# Patient Record
Sex: Female | Born: 1990 | Race: White | Hispanic: No | Smoking: Former smoker
Health system: Southern US, Community
[De-identification: ages and names within clinical notes are randomized; demographics above are authoritative.]

## PROBLEM LIST (undated history)

## (undated) DIAGNOSIS — F319 Bipolar disorder, unspecified: Secondary | ICD-10-CM

## (undated) DIAGNOSIS — E785 Hyperlipidemia, unspecified: Secondary | ICD-10-CM

## (undated) DIAGNOSIS — G4730 Sleep apnea, unspecified: Secondary | ICD-10-CM

## (undated) DIAGNOSIS — I252 Old myocardial infarction: Secondary | ICD-10-CM

## (undated) DIAGNOSIS — K219 Gastro-esophageal reflux disease without esophagitis: Secondary | ICD-10-CM

## (undated) DIAGNOSIS — E039 Hypothyroidism, unspecified: Secondary | ICD-10-CM

## (undated) DIAGNOSIS — K5900 Constipation, unspecified: Secondary | ICD-10-CM

## (undated) DIAGNOSIS — M2550 Pain in unspecified joint: Secondary | ICD-10-CM

## (undated) DIAGNOSIS — Z91018 Allergy to other foods: Secondary | ICD-10-CM

## (undated) DIAGNOSIS — Z8711 Personal history of peptic ulcer disease: Secondary | ICD-10-CM

## (undated) DIAGNOSIS — F410 Panic disorder [episodic paroxysmal anxiety] without agoraphobia: Secondary | ICD-10-CM

## (undated) DIAGNOSIS — R1310 Dysphagia, unspecified: Secondary | ICD-10-CM

## (undated) DIAGNOSIS — F909 Attention-deficit hyperactivity disorder, unspecified type: Secondary | ICD-10-CM

## (undated) DIAGNOSIS — I319 Disease of pericardium, unspecified: Secondary | ICD-10-CM

## (undated) DIAGNOSIS — M549 Dorsalgia, unspecified: Secondary | ICD-10-CM

## (undated) DIAGNOSIS — R112 Nausea with vomiting, unspecified: Secondary | ICD-10-CM

## (undated) DIAGNOSIS — K829 Disease of gallbladder, unspecified: Secondary | ICD-10-CM

## (undated) DIAGNOSIS — I10 Essential (primary) hypertension: Secondary | ICD-10-CM

## (undated) DIAGNOSIS — M069 Rheumatoid arthritis, unspecified: Secondary | ICD-10-CM

## (undated) DIAGNOSIS — M0820 Juvenile rheumatoid arthritis with systemic onset, unspecified site: Secondary | ICD-10-CM

## (undated) DIAGNOSIS — F32A Depression, unspecified: Secondary | ICD-10-CM

## (undated) DIAGNOSIS — I219 Acute myocardial infarction, unspecified: Secondary | ICD-10-CM

## (undated) DIAGNOSIS — M1990 Unspecified osteoarthritis, unspecified site: Secondary | ICD-10-CM

## (undated) DIAGNOSIS — R0602 Shortness of breath: Secondary | ICD-10-CM

## (undated) DIAGNOSIS — R079 Chest pain, unspecified: Secondary | ICD-10-CM

## (undated) DIAGNOSIS — F1990 Other psychoactive substance use, unspecified, uncomplicated: Secondary | ICD-10-CM

## (undated) DIAGNOSIS — R0600 Dyspnea, unspecified: Secondary | ICD-10-CM

## (undated) DIAGNOSIS — D649 Anemia, unspecified: Secondary | ICD-10-CM

## (undated) DIAGNOSIS — E559 Vitamin D deficiency, unspecified: Secondary | ICD-10-CM

## (undated) DIAGNOSIS — R519 Headache, unspecified: Secondary | ICD-10-CM

## (undated) HISTORY — DX: Shortness of breath: R06.02

## (undated) HISTORY — DX: Allergy to other foods: Z91.018

## (undated) HISTORY — DX: Pain in unspecified joint: M25.50

## (undated) HISTORY — DX: Personal history of peptic ulcer disease: Z87.11

## (undated) HISTORY — DX: Hypothyroidism, unspecified: E03.9

## (undated) HISTORY — DX: Unspecified osteoarthritis, unspecified site: M19.90

## (undated) HISTORY — DX: Chest pain, unspecified: R07.9

## (undated) HISTORY — DX: Bipolar disorder, unspecified: F31.9

## (undated) HISTORY — DX: Attention-deficit hyperactivity disorder, unspecified type: F90.9

## (undated) HISTORY — DX: Essential (primary) hypertension: I10

## (undated) HISTORY — DX: Old myocardial infarction: I25.2

## (undated) HISTORY — DX: Other psychoactive substance use, unspecified, uncomplicated: F19.90

## (undated) HISTORY — DX: Vitamin D deficiency, unspecified: E55.9

## (undated) HISTORY — DX: Hyperlipidemia, unspecified: E78.5

## (undated) HISTORY — DX: Disease of gallbladder, unspecified: K82.9

## (undated) HISTORY — DX: Dysphagia, unspecified: R13.10

## (undated) HISTORY — DX: Dorsalgia, unspecified: M54.9

## (undated) HISTORY — PX: WISDOM TOOTH EXTRACTION: SHX21

## (undated) HISTORY — DX: Constipation, unspecified: K59.00

---

## 2011-05-25 DIAGNOSIS — M0800 Unspecified juvenile rheumatoid arthritis of unspecified site: Secondary | ICD-10-CM | POA: Insufficient documentation

## 2011-05-25 DIAGNOSIS — F3132 Bipolar disorder, current episode depressed, moderate: Secondary | ICD-10-CM | POA: Insufficient documentation

## 2011-08-01 DIAGNOSIS — I219 Acute myocardial infarction, unspecified: Secondary | ICD-10-CM

## 2011-08-01 HISTORY — DX: Acute myocardial infarction, unspecified: I21.9

## 2012-11-16 ENCOUNTER — Emergency Department (HOSPITAL_COMMUNITY)
Admission: EM | Admit: 2012-11-16 | Discharge: 2012-11-16 | Disposition: A | Discharge status: Home / Self Care | Payer: Self-pay | Attending: Emergency Medicine | Admitting: Emergency Medicine

## 2012-11-16 ENCOUNTER — Encounter (HOSPITAL_COMMUNITY): Payer: Self-pay

## 2012-11-16 DIAGNOSIS — F17200 Nicotine dependence, unspecified, uncomplicated: Secondary | ICD-10-CM | POA: Insufficient documentation

## 2012-11-16 DIAGNOSIS — K089 Disorder of teeth and supporting structures, unspecified: Secondary | ICD-10-CM | POA: Insufficient documentation

## 2012-11-16 DIAGNOSIS — K0889 Other specified disorders of teeth and supporting structures: Secondary | ICD-10-CM

## 2012-11-16 DIAGNOSIS — R5381 Other malaise: Secondary | ICD-10-CM | POA: Insufficient documentation

## 2012-11-16 DIAGNOSIS — M069 Rheumatoid arthritis, unspecified: Secondary | ICD-10-CM | POA: Insufficient documentation

## 2012-11-16 HISTORY — DX: Rheumatoid arthritis, unspecified: M06.9

## 2012-11-16 MED ORDER — OXYCODONE-ACETAMINOPHEN 5-325 MG PO TABS
1.0000 | ORAL_TABLET | Freq: Once | ORAL | Status: DC
  Filled 2012-11-16: qty 1

## 2012-11-16 MED ORDER — OXYCODONE-ACETAMINOPHEN 5-325 MG PO TABS: 1.0000 | ORAL_TABLET | Freq: Four times a day (QID) | ORAL | Status: DC | PRN

## 2012-11-16 NOTE — ED Provider Notes (Signed)
History     CSN: [id number]  Arrival date & time 11/16/12  1336   First MD Initiated Contact with Patient 11/16/12 1354      Chief Complaint  Patient presents with  . Dental Pain    (Consider location/radiation/quality/duration/timing/severity/associated sxs/prior treatment) Patient is a 22 y.o. female presenting with tooth pain. The history is provided by the patient (the pt complains of a toothache.  she is on amoxicillin). No language interpreter was used.  Dental PainThe primary symptoms include mouth pain. Primary symptoms do not include headaches or cough. The symptoms began more than 1 month ago. The symptoms are worsening. The symptoms are recurrent. The symptoms occur constantly.  Additional symptoms include: dental sensitivity to temperature. Additional symptoms do not include: gum swelling and fatigue.    Past Medical History  Diagnosis Date  . RA (rheumatoid arthritis)     History reviewed. No pertinent past surgical history.  No family history on file.  History  Substance Use Topics  . Smoking status: Current Every Day Smoker    Types: Cigarettes  . Smokeless tobacco: Not on file  . Alcohol Use: No    OB History   Grav Para Term Preterm Abortions TAB SAB Ect Mult Living                  Review of Systems  Constitutional: Negative for appetite change and fatigue.  HENT: Negative for congestion, sinus pressure and ear discharge.        Toothache  Eyes: Negative for discharge.  Respiratory: Negative for cough.   Cardiovascular: Negative for chest pain.  Gastrointestinal: Negative for abdominal pain and diarrhea.  Genitourinary: Negative for frequency and hematuria.  Musculoskeletal: Negative for back pain.  Skin: Negative for rash.  Neurological: Negative for seizures and headaches.  Psychiatric/Behavioral: Negative for hallucinations.    Allergies  Tramadol and Strawberry  Home Medications   Current Outpatient Rx  Name  Route  Sig  Dispense   Refill  . amoxicillin (AMOXIL) 500 MG capsule   Oral   Take 500 mg by mouth 3 (three) times daily. For tooth infection.         Marland Kitchen HYDROcodone-acetaminophen (NORCO/VICODIN) 5-325 MG per tablet   Oral   Take 1 tablet by mouth every 6 (six) hours as needed for pain.         . naproxen (NAPROSYN) 500 MG tablet   Oral   Take 500 mg by mouth 2 (two) times daily.         Marland Kitchen oxyCODONE-acetaminophen (PERCOCET/ROXICET) 5-325 MG per tablet   Oral   Take 1 tablet by mouth every 6 (six) hours as needed for pain.   20 tablet   0     BP 133/89  Pulse 71  Temp(Src) 97.7 F (36.5 C) (Oral)  Resp 18  SpO2 100%  LMP 10/10/2012  Physical Exam  Constitutional: She is oriented to person, place, and time. She appears well-developed.  HENT:  Head: Normocephalic.  Tender right lower molar  Eyes: Conjunctivae are normal.  Neck: No tracheal deviation present.  Cardiovascular:  No murmur heard. Musculoskeletal: Normal range of motion.  Neurological: She is oriented to person, place, and time.  Skin: Skin is warm.  Psychiatric: She has a normal mood and affect.    ED Course  Procedures (including critical care time)  Labs Reviewed - No data to display No results found.   1. Toothache       MDM  Benny Lennert, MD 11/16/12 [phone]

## 2012-11-16 NOTE — Discharge Instructions (Signed)
Follow up with a dentist

## 2012-11-16 NOTE — ED Notes (Signed)
Pt stated she has also been to [facility] for the same, has no dentist.

## 2012-11-16 NOTE — ED Notes (Signed)
Pt reports dental pain for 3 weeks, has been to [facility] x2 for same --was seen there today, and given amoxil and lortab. She thinks that the meds are not working.  She has been having trouble w/ her tooth since January.

## 2014-01-22 ENCOUNTER — Emergency Department (HOSPITAL_COMMUNITY): Payer: Medicaid Other

## 2014-01-22 ENCOUNTER — Encounter (HOSPITAL_COMMUNITY): Payer: Self-pay | Admitting: Emergency Medicine

## 2014-01-22 ENCOUNTER — Emergency Department (HOSPITAL_COMMUNITY)
Admission: EM | Admit: 2014-01-22 | Discharge: 2014-01-23 | Disposition: A | Discharge status: Home / Self Care | Payer: Medicaid Other | Attending: Emergency Medicine | Admitting: Emergency Medicine

## 2014-01-22 DIAGNOSIS — Z8679 Personal history of other diseases of the circulatory system: Secondary | ICD-10-CM | POA: Insufficient documentation

## 2014-01-22 DIAGNOSIS — Z87891 Personal history of nicotine dependence: Secondary | ICD-10-CM | POA: Insufficient documentation

## 2014-01-22 DIAGNOSIS — Z791 Long term (current) use of non-steroidal anti-inflammatories (NSAID): Secondary | ICD-10-CM | POA: Insufficient documentation

## 2014-01-22 DIAGNOSIS — R6883 Chills (without fever): Secondary | ICD-10-CM | POA: Insufficient documentation

## 2014-01-22 DIAGNOSIS — M069 Rheumatoid arthritis, unspecified: Secondary | ICD-10-CM | POA: Insufficient documentation

## 2014-01-22 DIAGNOSIS — J9801 Acute bronchospasm: Secondary | ICD-10-CM | POA: Insufficient documentation

## 2014-01-22 DIAGNOSIS — R609 Edema, unspecified: Secondary | ICD-10-CM | POA: Insufficient documentation

## 2014-01-22 HISTORY — DX: Disease of pericardium, unspecified: I31.9

## 2014-01-22 HISTORY — DX: Juvenile rheumatoid arthritis with systemic onset, unspecified site: M08.20

## 2014-01-22 LAB — COMPREHENSIVE METABOLIC PANEL
ALBUMIN: 3.6 g/dL (ref 3.5–5.2)
ALK PHOS: 77 U/L (ref 39–117)
ALT: 41 U/L — ABNORMAL HIGH (ref 0–35)
AST: 24 U/L (ref 0–37)
BILIRUBIN TOTAL: 0.2 mg/dL — AB (ref 0.3–1.2)
BUN: 17 mg/dL (ref 6–23)
CHLORIDE: 102 meq/L (ref 96–112)
CO2: 25 mEq/L (ref 19–32)
CREATININE: 0.96 mg/dL (ref 0.50–1.10)
Calcium: 9.2 mg/dL (ref 8.4–10.5)
GFR calc Af Amer: >90 mL/min (ref >90)
GFR calc non Af Amer: 83 mL/min — ABNORMAL LOW (ref >90)
Glucose, Bld: 92 mg/dL (ref 70–99)
POTASSIUM: 3.5 meq/L — AB (ref 3.7–5.3)
Sodium: 140 mEq/L (ref 137–147)
TOTAL PROTEIN: 7.5 g/dL (ref 6.0–8.3)

## 2014-01-22 LAB — CBC WITH DIFFERENTIAL/PLATELET
BASOS ABS: 0 10*3/uL (ref 0.0–0.1)
BASOS PCT: 0 % (ref 0–1)
EOS ABS: 0.3 10*3/uL (ref 0.0–0.7)
Eosinophils Relative: 2 % (ref 0–5)
HCT: 38.1 % (ref 36.0–46.0)
Hemoglobin: 12.9 g/dL (ref 12.0–15.0)
Lymphocytes Relative: 23 % (ref 12–46)
Lymphs Abs: 3 10*3/uL (ref 0.7–4.0)
MCH: 29.9 pg (ref 26.0–34.0)
MCHC: 33.9 g/dL (ref 30.0–36.0)
MCV: 88.4 fL (ref 78.0–100.0)
MONOS PCT: 5 % (ref 3–12)
Monocytes Absolute: 0.7 10*3/uL (ref 0.1–1.0)
NEUTROS ABS: 8.8 10*3/uL — AB (ref 1.7–7.7)
NEUTROS PCT: 70 % (ref 43–77)
Platelets: 293 10*3/uL (ref 150–400)
RBC: 4.31 MIL/uL (ref 3.87–5.11)
RDW: 14.2 % (ref 11.5–15.5)
WBC: 12.8 10*3/uL — ABNORMAL HIGH (ref 4.0–10.5)

## 2014-01-22 MED ORDER — IPRATROPIUM-ALBUTEROL 0.5-2.5 (3) MG/3ML IN SOLN
3.0000 mL | Freq: Once | RESPIRATORY_TRACT | Status: AC
  Administered 2014-01-22: 3 mL via RESPIRATORY_TRACT
  Filled 2014-01-22: qty 3

## 2014-01-22 NOTE — ED Provider Notes (Signed)
CSN: [id number]     Arrival date & time 01/22/14  2115 History   First MD Initiated Contact with Patient 01/22/14 2310   This chart was scribed for Dione Booze, MD by Valera Castle, ED Scribe. This patient was seen in room APA07/AP[facility]07 and the patient's care was started at 11:13 PM.   Chief Complaint  Patient presents with  . Chest Pain   The history is provided by the patient. No language interpreter was used.   HPI Comments: [name] is a 23 y.o. female who presents to the Emergency Department complaining of constant, 8/10, tight left upper CP, onset 5 hours ago, that does not radiate. Patient reports that laying flat makes the pain worse, and sitting up straight relieves the pain. Patient reports assoicated symptoms of SOB, sweating nausea, and chills. Pt denies vomiting, cough, fever and any other associated symptoms. 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 May 22nd pericarditis hospitalized in [place]. Patient quit smoking 3 weeks ago. Was smoking 1 pack a day.    Past Medical History  Diagnosis Date  . RA (rheumatoid arthritis)   . Pericarditis   . Still's disease    History reviewed. No pertinent past surgical history. History reviewed. No pertinent family history. History  Substance Use Topics  . Smoking status: Former Smoker    Types: Cigarettes    Quit date: 01/01/2014  . Smokeless tobacco: Not on file  . Alcohol Use: No   OB History   Grav Para Term Preterm Abortions TAB SAB Ect Mult Living                 Review of Systems  Constitutional: Positive for chills.  All other systems reviewed and are negative.  Allergies  Toradol; Tramadol; and Strawberry  Home Medications   Prior to Admission medications   Medication Sig Start Date End Date Taking? Authorizing Provider  naproxen (NAPROSYN) 500 MG tablet Take 500 mg by mouth 2 (two) times daily.   Yes Historical Provider, MD   BP 126/78  Pulse 92   Temp(Src) 98.4 F (36.9 C) (Oral)  Resp 20  Ht 5\' 3"  (1.6 m)  Wt 255 lb (115.667 kg)  BMI 45.18 kg/m2  SpO2 99%  LMP 01/19/2014 Physical Exam  Nursing note and vitals reviewed. Constitutional: She is oriented to person, place, and time. She appears well-developed and well-nourished. No distress.  HENT:  Head: Normocephalic and atraumatic.  Eyes: Conjunctivae and EOM are normal. Pupils are equal, round, and reactive to light.  Neck: Normal range of motion. Neck supple. No JVD present. No tracheal deviation present.  Cardiovascular: Normal rate and regular rhythm.  Exam reveals no friction rub.   No murmur heard. Pulmonary/Chest: Effort normal and breath sounds normal. No respiratory distress. She has no wheezes. She has no rales.  Abdominal: Soft. Bowel sounds are normal. She exhibits no distension and no mass. There is no tenderness.  Musculoskeletal: Normal range of motion. She exhibits edema.  Trace edema  Lymphadenopathy:    She has no cervical adenopathy.  Neurological: She is alert and oriented to person, place, and time. She has normal reflexes. No cranial nerve deficit. Coordination normal.  Skin: Skin is warm and dry. No rash noted.  Psychiatric: She has a normal mood and affect. Her behavior is normal. Thought content normal.    ED Course  Procedures (including critical care time) DIAGNOSTIC STUDIES: Oxygen Saturation is 99% on RA, normal by my interpretation.  COORDINATION OF CARE: 11:20 PM-Discussed treatment plan which includes breathing treatment and blood work with pt at bedside and pt agreed to plan.   Medications  ipratropium-albuterol (DUONEB) 0.5-2.5 (3) MG/3ML nebulizer solution 3 mL (3 mLs Nebulization Given 01/22/14 2355)  ondansetron (ZOFRAN-ODT) disintegrating tablet 4 mg (4 mg Oral Given 01/23/14 0043)   Results for orders placed during the hospital encounter of 01/22/14  CBC WITH DIFFERENTIAL      Result Value Ref Range   WBC 12.8 (*) 4.0 - 10.5  K/uL   RBC 4.31  3.87 - 5.11 MIL/uL   Hemoglobin 12.9  12.0 - 15.0 g/dL   HCT 79.0  24.0 - 97.3 %   MCV 88.4  78.0 - 100.0 fL   MCH 29.9  26.0 - 34.0 pg   MCHC 33.9  30.0 - 36.0 g/dL   RDW 53.2  99.2 - 42.6 %   Platelets 293  150 - 400 K/uL   Neutrophils Relative % 70  43 - 77 %   Neutro Abs 8.8 (*) 1.7 - 7.7 K/uL   Lymphocytes Relative 23  12 - 46 %   Lymphs Abs 3.0  0.7 - 4.0 K/uL   Monocytes Relative 5  3 - 12 %   Monocytes Absolute 0.7  0.1 - 1.0 K/uL   Eosinophils Relative 2  0 - 5 %   Eosinophils Absolute 0.3  0.0 - 0.7 K/uL   Basophils Relative 0  0 - 1 %   Basophils Absolute 0.0  0.0 - 0.1 K/uL  COMPREHENSIVE METABOLIC PANEL      Result Value Ref Range   Sodium 140  137 - 147 mEq/L   Potassium 3.5 (*) 3.7 - 5.3 mEq/L   Chloride 102  96 - 112 mEq/L   CO2 25  19 - 32 mEq/L   Glucose, Bld 92  70 - 99 mg/dL   BUN 17  6 - 23 mg/dL   Creatinine, Ser 8.34  0.50 - 1.10 mg/dL   Calcium 9.2  8.4 - 19.6 mg/dL   Total Protein 7.5  6.0 - 8.3 g/dL   Albumin 3.6  3.5 - 5.2 g/dL   AST 24  0 - 37 U/L   ALT 41 (*) 0 - 35 U/L   Alkaline Phosphatase 77  39 - 117 U/L   Total Bilirubin 0.2 (*) 0.3 - 1.2 mg/dL   GFR calc non Af Amer 83 (*) >90 mL/min   GFR calc Af Amer >90  >90 mL/min  SEDIMENTATION RATE      Result Value Ref Range   Sed Rate 30 (*) 0 - 22 mm/hr   Dg Chest 2 View  01/22/2014   CLINICAL DATA:  Left-sided chest pain for 6 hr.  EXAM: CHEST  2 VIEW  COMPARISON:  08/08/2013  FINDINGS: The heart size and mediastinal contours are within normal limits. Both lungs are clear. The visualized skeletal structures are unremarkable.  IMPRESSION: No active cardiopulmonary disease.   Electronically Signed   By: Burman Nieves M.D.   On: 01/22/2014 23:32   Images viewed by me.    EKG Interpretation   Date/Time:  Thursday January 22 2014 21:39:09 EDT Ventricular Rate:  68 PR Interval:  178 QRS Duration: 74 QT Interval:  348 QTC Calculation: 370 R Axis:   53 Text Interpretation:   Sinus rhythm with marked sinus arrhythmia Otherwise  normal ECG No old tracing to compare Confirmed by [facility]  MD, DAVID ([id number])  on 01/22/2014 11:10:31 PM  MDM   Final diagnoses:  Bronchospasm    Chest tightness with slightly prolonged exhalation phase suggesting that they've etiology is bronchospasm. No findings on physical exam, x-ray, or ECG to suggest recurrent pericarditis. She will be given a therapeutic trial of albuterol with ipratropium. Screening labs including sedimentation rate will be obtained.   She feels significantly better following albuterol with ipratropium. Sedimentation rate is minimally elevated at 30 and I do not feel this is consistent with pericarditis. She will be discharged with a prescription for prednisone and albuterol inhaler. Initial dose of prednisone was given in the ED. She is encouraged to get established with a local rheumatologist.  I personally performed the services described in this documentation, which was scribed in my presence. The recorded information has been reviewed and is accurate.     Dione Booze, MD 01/23/14 [phone]

## 2014-01-22 NOTE — ED Notes (Signed)
Pt c/o left chest tightness 2-3 hours, hx of pericarditis, also feels like her throat is closing up esp during hot spells

## 2014-01-23 LAB — SEDIMENTATION RATE: SED RATE: 30 mm/h — AB (ref 0–22)

## 2014-01-23 MED ORDER — ALBUTEROL SULFATE HFA 108 (90 BASE) MCG/ACT IN AERS
2.0000 | INHALATION_SPRAY | RESPIRATORY_TRACT | Status: DC | PRN
  Administered 2014-01-23: 2 via RESPIRATORY_TRACT
  Filled 2014-01-23: qty 6.7

## 2014-01-23 MED ORDER — PREDNISONE 50 MG PO TABS
60.0000 mg | ORAL_TABLET | Freq: Once | ORAL | Status: AC
  Administered 2014-01-23: 60 mg via ORAL
  Filled 2014-01-23 (×2): qty 1

## 2014-01-23 MED ORDER — ACETAMINOPHEN 325 MG PO TABS
650.0000 mg | ORAL_TABLET | Freq: Once | ORAL | Status: AC
  Administered 2014-01-23: 650 mg via ORAL
  Filled 2014-01-23: qty 2

## 2014-01-23 MED ORDER — ONDANSETRON 4 MG PO TBDP
4.0000 mg | ORAL_TABLET | Freq: Once | ORAL | Status: AC
  Administered 2014-01-23: 4 mg via ORAL
  Filled 2014-01-23: qty 1

## 2014-01-23 NOTE — ED Notes (Signed)
Pt. Reports feeling nauseous and hot. EDP notified.

## 2014-01-23 NOTE — ED Notes (Addendum)
Pt. Vomited once prior to Zofran administration.

## 2014-01-23 NOTE — Discharge Instructions (Signed)
Take 60 mg prednisone for 5 days, then 40 mg for 5 days, then 20 mg for five days.  Bronchospasm A bronchospasm is a spasm or tightening of the airways going into the lungs. During a bronchospasm breathing becomes more difficult because the airways get smaller. When this happens there can be coughing, a whistling sound when breathing (wheezing), and difficulty breathing. Bronchospasm is often associated with asthma, but not all patients who experience a bronchospasm have asthma. CAUSES  A bronchospasm is caused by inflammation or irritation of the airways. The inflammation or irritation may be triggered by:   Allergies (such as to animals, pollen, food, or mold). Allergens that cause bronchospasm may cause wheezing immediately after exposure or many hours later.   Infection. Viral infections are believed to be the most common cause of bronchospasm.   Exercise.   Irritants (such as pollution, cigarette smoke, strong odors, aerosol sprays, and paint fumes).   Weather changes. Winds increase molds and pollens in the air. Rain refreshes the air by washing irritants out. Cold air may cause inflammation.   Stress and emotional upset.  SIGNS AND SYMPTOMS   Wheezing.   Excessive nighttime coughing.   Frequent or severe coughing with a simple cold.   Chest tightness.   Shortness of breath.  DIAGNOSIS  Bronchospasm is usually diagnosed through a history and physical exam. Tests, such as chest X-rays, are sometimes done to look for other conditions. TREATMENT   Inhaled medicines can be given to open up your airways and help you breathe. The medicines can be given using either an inhaler or a nebulizer machine.  Corticosteroid medicines may be given for severe bronchospasm, usually when it is associated with asthma. HOME CARE INSTRUCTIONS   Always have a plan prepared for seeking medical care. Know when to call your health care provider and local emergency services (911 in the  [place] Know where you can access local emergency care.  Only take medicines as directed by your health care provider.  If you were prescribed an inhaler or nebulizer machine, ask your health care provider to explain how to use it correctly. Always use a spacer with your inhaler if you were given one.  It is necessary to remain calm during an attack. Try to relax and breathe more slowly.  Control your home environment in the following ways:   Change your heating and air conditioning filter at least once a month.   Limit your use of fireplaces and wood stoves.  Do not smoke and do not allow smoking in your home.   Avoid exposure to perfumes and fragrances.   Get rid of pests (such as roaches and mice) and their droppings.   Throw away plants if you see mold on them.   Keep your house clean and dust free.   Replace carpet with wood, tile, or vinyl flooring. Carpet can trap dander and dust.   Use allergy-proof pillows, mattress covers, and box spring covers.   Wash bed sheets and blankets every week in hot water and dry them in a dryer.   Use blankets that are made of polyester or cotton.   Wash hands frequently. SEEK MEDICAL CARE IF:   You have muscle aches.   You have chest pain.   The sputum changes from clear or white to yellow, green, gray, or bloody.   The sputum you cough up gets thicker.   There are problems that may be related to the medicine you are given, such as a rash,  itching, swelling, or trouble breathing.  SEEK IMMEDIATE MEDICAL CARE IF:   You have worsening wheezing and coughing even after taking your prescribed medicines.   You have increased difficulty breathing.   You develop severe chest pain. MAKE SURE YOU:   Understand these instructions.  Will watch your condition.  Will get help right away if you are not doing well or get worse. Document Released: 07/20/2003 Document Revised: 07/22/2013 Document Reviewed:  01/06/2013 [facility] Patient Information 2015 [place], [place]. This information is not intended to replace advice given to you by your health care provider. Make sure you discuss any questions you have with your health care provider.  Albuterol inhalation aerosol What is this medicine? ALBUTEROL (al Gaspar Bidding) is a bronchodilator. It helps open up the airways in your lungs to make it easier to breathe. This medicine is used to treat and to prevent bronchospasm. This medicine may be used for other purposes; ask your health care provider or pharmacist if you have questions. COMMON BRAND NAME(S): Proair HFA, Proventil, Proventil HFA, Respirol, Ventolin, Ventolin HFA What should I tell my health care provider before I take this medicine? They need to know if you have any of the following conditions: -diabetes -heart disease or irregular heartbeat -high blood pressure -pheochromocytoma -seizures -thyroid disease -an unusual or allergic reaction to albuterol, levalbuterol, sulfites, other medicines, foods, dyes, or preservatives -pregnant or trying to get pregnant -breast-feeding How should I use this medicine? This medicine is for inhalation through the mouth. Follow the directions on your prescription label. Take your medicine at regular intervals. Do not use more often than directed. Make sure that you are using your inhaler correctly. Ask you doctor or health care provider if you have any questions. Talk to your pediatrician regarding the use of this medicine in children. Special care may be needed. Overdosage: If you think you have taken too much of this medicine contact a poison control center or emergency room at once. NOTE: This medicine is only for you. Do not share this medicine with others. What if I miss a dose? If you miss a dose, use it as soon as you can. If it is almost time for your next dose, use only that dose. Do not use double or extra doses. What may interact with this  medicine? -anti-infectives like chloroquine and pentamidine -caffeine -cisapride -diuretics -medicines for colds -medicines for depression or for emotional or psychotic conditions -medicines for weight loss including some herbal products -methadone -some antibiotics like clarithromycin, erythromycin, levofloxacin, and linezolid -some heart medicines -steroid hormones like dexamethasone, cortisone, hydrocortisone -theophylline -thyroid hormones This list may not describe all possible interactions. Give your health care provider a list of all the medicines, herbs, non-prescription drugs, or dietary supplements you use. Also tell them if you smoke, drink alcohol, or use illegal drugs. Some items may interact with your medicine. What should I watch for while using this medicine? Tell your doctor or health care professional if your symptoms do not improve. Do not use extra albuterol. If your asthma or bronchitis gets worse while you are using this medicine, call your doctor right away. If your mouth gets dry try chewing sugarless gum or sucking hard candy. Drink water as directed. What side effects may I notice from receiving this medicine? Side effects that you should report to your doctor or health care professional as soon as possible: -allergic reactions like skin rash, itching or hives, swelling of the face, lips, or tongue -breathing problems -chest pain -  feeling faint or lightheaded, falls -high blood pressure -irregular heartbeat -fever -muscle cramps or weakness -pain, tingling, numbness in the hands or feet -vomiting Side effects that usually do not require medical attention (report to your doctor or health care professional if they continue or are bothersome): -cough -difficulty sleeping -headache -nervousness or trembling -stomach upset -stuffy or runny nose -throat irritation -unusual taste This list may not describe all possible side effects. Call your doctor for  medical advice about side effects. You may report side effects to FDA at 1-800-FDA-1088. Where should I keep my medicine? Keep out of the reach of children. Store at room temperature between 15 and 30 degrees C (59 and 86 degrees F). The contents are under pressure and may burst when exposed to heat or flame. Do not freeze. This medicine does not work as well if it is too cold. Throw away any unused medicine after the expiration date. Inhalers need to be thrown away after the labeled number of puffs have been used or by the expiration date; whichever comes first. Ventolin HFA should be thrown away 12 months after removing from foil pouch. Check the instructions that come with your medicine. NOTE: This sheet is a summary. It may not cover all possible information. If you have questions about this medicine, talk to your doctor, pharmacist, or health care provider.  2015, [employer]. (2013-01-02 10:57:17)

## 2014-02-18 DIAGNOSIS — G4700 Insomnia, unspecified: Secondary | ICD-10-CM | POA: Insufficient documentation

## 2014-04-09 ENCOUNTER — Emergency Department (HOSPITAL_COMMUNITY)
Admission: EM | Admit: 2014-04-09 | Discharge: 2014-04-09 | Discharge status: Against Medical Advice | Payer: Medicaid Other | Attending: Emergency Medicine | Admitting: Emergency Medicine

## 2014-04-09 ENCOUNTER — Encounter (HOSPITAL_COMMUNITY): Payer: Self-pay | Admitting: Emergency Medicine

## 2014-04-09 DIAGNOSIS — K5900 Constipation, unspecified: Secondary | ICD-10-CM | POA: Insufficient documentation

## 2014-04-09 DIAGNOSIS — R109 Unspecified abdominal pain: Secondary | ICD-10-CM | POA: Diagnosis present

## 2014-04-09 DIAGNOSIS — F17200 Nicotine dependence, unspecified, uncomplicated: Secondary | ICD-10-CM | POA: Insufficient documentation

## 2014-04-09 NOTE — ED Notes (Signed)
Pt called for room placement.  Pt left without being seen.

## 2014-04-09 NOTE — ED Notes (Signed)
abd pain, Constipation for 2 weeks Has uses laxatives and enemas without relief.  N/v

## 2014-04-09 NOTE — ED Notes (Signed)
Pt called for room placement.  No response 

## 2014-04-11 ENCOUNTER — Encounter: Payer: Self-pay | Admitting: Cardiology

## 2014-04-14 ENCOUNTER — Ambulatory Visit (INDEPENDENT_AMBULATORY_CARE_PROVIDER_SITE_OTHER): Payer: Medicaid Other | Admitting: Cardiology

## 2014-04-14 ENCOUNTER — Encounter: Payer: Self-pay | Admitting: Cardiology

## 2014-04-14 VITALS — BP 117/77 | HR 78 | Ht 63.0 in | Wt 256.0 lb

## 2014-04-14 DIAGNOSIS — M083 Juvenile rheumatoid polyarthritis (seronegative): Secondary | ICD-10-CM | POA: Diagnosis not present

## 2014-04-14 DIAGNOSIS — R002 Palpitations: Secondary | ICD-10-CM | POA: Diagnosis not present

## 2014-04-14 DIAGNOSIS — R079 Chest pain, unspecified: Secondary | ICD-10-CM

## 2014-04-14 DIAGNOSIS — M0820 Juvenile rheumatoid arthritis with systemic onset, unspecified site: Secondary | ICD-10-CM | POA: Insufficient documentation

## 2014-04-14 NOTE — Progress Notes (Signed)
Clinical Summary Ms. Gamarra is a 23 y.o.female seen today as a new patient for the following medical problems.  1. Chest pain - on and off for several years. Occurs approx 1-2 times a day. Feels increased frequency and severity over the last year. Squeezing feeling left side, 5/10. Can occur at rest or with exertion. No other associated symptoms. Not positional. Pain lasts up to 10 minutes. Nothing makes pain better.  - seen in ER 12/2013 with chest pain. Cosntant 8/10 left sided, lasted 5 hours, worst with position. 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 She reports a long hospitalization at [facility] in [place], [place]. She states she was treated with prednisone at the time for ongoing chest pain due to pericarditis and joint pain, pain somewhat improved with predinsone.   - can walk a mile on flatground, some DOE with incline that is stable. No LE edema.  - CAD risk factors: +tobacco, +HL per her report, mother MI 12  2. Palpitations - feels heart racing at times, typically once a month. Lasts for approx 1 minute. Feels lightheaded - can occur at rest or with exertion.  - occasional coffee, 1 cup pepsi daily, no energy drinks, occas EtOH.  3. Still's disease - followed by Dr Karie Mainland at [facility], rheum history reviewed from his last note 04/01/14 which appears to have been there first visit together.  - hx of juvenile RA diagnosed age 76, previously on prednisone, methotrexate in the past. Due to weight gain she has been resistant to continued predisone.    Past Medical History  Diagnosis Date  . RA (rheumatoid arthritis)   . Pericarditis   . Still's disease      Allergies  Allergen Reactions  . Toradol [Ketorolac Tromethamine]   . Tramadol Other (See Comments)    "Passed out for 4  hours, vomited uncontrollably"  . Strawberry Itching and Rash     Current Outpatient Prescriptions  Medication Sig Dispense Refill  . naproxen (NAPROSYN) 500 MG tablet Take 500 mg by mouth 2 (two) times daily.       No current facility-administered medications for this visit.     No past surgical history on file.   Allergies  Allergen Reactions  . Toradol [Ketorolac Tromethamine]   . Tramadol Other (See Comments)    "Passed out for 4 hours, vomited uncontrollably"  . Strawberry Itching and Rash      No family history on file.   Social History Ms. Bunyard reports that she has been smoking Cigarettes.  She has been smoking about 0.00 packs per day. She does not have any smokeless tobacco history on file. Ms. Snell reports that she does not drink alcohol.   Review of Systems CONSTITUTIONAL: No weight loss, fever, chills, weakness or fatigue.  HEENT: Eyes: No visual loss, blurred vision, double vision or yellow sclerae.No hearing loss, sneezing, congestion, runny nose or sore throat.  SKIN: No rash or itching.  CARDIOVASCULAR: per HPI RESPIRATORY: No shortness of breath, cough or sputum.  GASTROINTESTINAL: No anorexia, nausea, vomiting or diarrhea. No abdominal pain or blood.  GENITOURINARY: No burning on urination, no polyuria NEUROLOGICAL: No headache, dizziness, syncope, paralysis, ataxia, numbness or tingling in the extremities. No change in bowel or bladder control.  MUSCULOSKELETAL: No muscle, back pain, joint pain or stiffness.  LYMPHATICS: No enlarged nodes. No history of splenectomy.  PSYCHIATRIC: No history of depression or anxiety.  ENDOCRINOLOGIC: No reports of sweating, cold or heat intolerance. No polyuria or polydipsia.  Marland Kitchen   Physical Examination p 78 bp 117/77 Wt 256 lbs BMI 45 Gen: resting comfortably, no acute distress HEENT: no scleral icterus, pupils equal round and reactive, no palptable cervical adenopathy,  CV: RRR, no m/r/g, no JVD, no carotid  bruits Resp: Clear to auscultation bilaterally GI: abdomen is soft, non-tender, non-distended, normal bowel sounds, no hepatosplenomegaly MSK: extremities are warm, no edema.  Skin: warm, no rash Neuro:  no focal deficits Psych: appropriate affect   Diagnostic Studies  04/14/14 Clinic EKG NSR   Assessment and Plan  1. Chest pain - unclear etiology at this time, given her age and lack of traditional risk factors ischemia is very unlikely. Most likely related to her Still's disease and chronic inflammation - EKG without evidence of pericarditis - Still's disease can have pericardial and cardiac involvement, in setting of chest pain check echo - will request records from long hospitalization in [facility] where she was diagnosed with pericarditis and pericardial effusion  2. Palpitations - fairly infrequency, occur only once a month - counseled to decrease caffeine intake, pending symptoms may need consideration for monitor in the future  F/u 3-4 weeks      Antoine Poche, M.D.

## 2014-04-14 NOTE — Patient Instructions (Signed)
There were no changes to your medications. Continue as directed. Your physician has requested that you have an echocardiogram. Echocardiography is a painless test that uses sound waves to create images of your heart. It provides your doctor with information about the size and shape of your heart and how well your heart's chambers and valves are working. This procedure takes approximately one hour. There are no restrictions for this procedure. Office will contact with results via phone or letter.   Follow up in 3-4 weeks

## 2014-04-29 ENCOUNTER — Other Ambulatory Visit: Payer: Self-pay

## 2014-04-29 ENCOUNTER — Other Ambulatory Visit (INDEPENDENT_AMBULATORY_CARE_PROVIDER_SITE_OTHER): Payer: Medicaid Other

## 2014-04-29 DIAGNOSIS — M083 Juvenile rheumatoid polyarthritis (seronegative): Secondary | ICD-10-CM

## 2014-04-29 DIAGNOSIS — R079 Chest pain, unspecified: Secondary | ICD-10-CM

## 2014-04-29 DIAGNOSIS — R002 Palpitations: Secondary | ICD-10-CM

## 2014-04-30 ENCOUNTER — Other Ambulatory Visit: Payer: Medicaid Other

## 2014-05-01 ENCOUNTER — Telehealth: Payer: Self-pay | Admitting: *Deleted

## 2014-05-01 NOTE — Telephone Encounter (Signed)
Notes Recorded by Lesle Chris, LPN on 31/11/4006 at 2:15 PM Patient notified. Follow up already scheduled for 05/04/14 with Dr. Wyline Mood.

## 2014-05-01 NOTE — Telephone Encounter (Signed)
Message copied by Lesle Chris on Fri May 01, 2014  2:15 PM ------      Message from: Blacktail F      Created: Thu Apr 30, 2014 12:42 PM       Echo looks good. Her heart function is normal, and there is no evidence of any fluid surrounding the heart. Will discuss in more detail at our follow up                  Dominga Ferry MD ------

## 2014-05-04 ENCOUNTER — Encounter: Payer: Medicaid Other | Admitting: Cardiology

## 2014-05-04 NOTE — Progress Notes (Signed)
Encounter opened in error

## 2014-05-08 ENCOUNTER — Ambulatory Visit (INDEPENDENT_AMBULATORY_CARE_PROVIDER_SITE_OTHER): Payer: Medicaid Other | Admitting: Cardiology

## 2014-05-08 ENCOUNTER — Encounter: Payer: Self-pay | Admitting: Cardiology

## 2014-05-08 VITALS — BP 114/77 | HR 88 | Ht 63.0 in | Wt 257.0 lb

## 2014-05-08 DIAGNOSIS — R079 Chest pain, unspecified: Secondary | ICD-10-CM

## 2014-05-08 DIAGNOSIS — R002 Palpitations: Secondary | ICD-10-CM | POA: Diagnosis not present

## 2014-05-08 NOTE — Patient Instructions (Signed)
There were no changes to your medications. Continue as directed. Your physician wants you to follow up in:  1 year.  You will receive a reminder letter in the mail one-two months in advance.  If you don't receive a letter, please call our office to schedule the follow up appointment. 

## 2014-05-08 NOTE — Progress Notes (Signed)
Clinical Summary Ms. Rounds is a 23 y.o.female seen today for follow up of the following medical problems.   1. Chest pain  - on and off for several years. Occurs approx 1-2 times a day. Feels increased frequency and severity over the last year. Squeezing feeling left side, 5/10. Can occur at rest or with exertion. No other associated symptoms. Not positional. Pain lasts up to 10 minutes. Nothing makes pain better.  - seen in ER 12/2013 with chest pain. Cosntant 8/10 left sided, lasted 5 hours, worst with position. 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 She reports a long hospitalization at [facility] in [place], [place]. She states she was treated with prednisone at the time for ongoing chest pain due to pericarditis and joint pain, pain somewhat improved with predinsone.  - from notes had chest pain at that time, trop of 2.4, non-specific EKG changes but clinical symptoms. CT PE negative. +rub on exam. Symptoms improved with NSAIDs and steroids  - can walk a mile on flatground, some DOE with incline that is stable. No LE edema.    - since last visit still has occasional chest pain at times. Often improved with narpoxen.    2. Palpitations  - feels heart racing at times, typically once a month. Lasts for approx 1 minute. Feels lightheaded  - can occur at rest or with exertion.  - occasional coffee, 1 cup pepsi daily, no energy drinks, occas EtOH.  - since last visit only mild symptoms   3. Still's disease  - followed by Dr Karie Mainland at [facility], rheum history reviewed from his last note 04/01/14 which appears to have been there first visit together.  - hx of juvenile RA diagnosed age 40, previously on prednisone, methotrexate in the past. Due to weight gain she has been resistant to continued  predisone.   Past Medical History  Diagnosis Date  . RA (rheumatoid arthritis)   . Pericarditis   . Still's disease      Allergies  Allergen Reactions  . Toradol [Ketorolac Tromethamine]   . Tramadol Other (See Comments)    "Passed out for 4 hours, vomited uncontrollably"  . Strawberry Itching and Rash     Current Outpatient Prescriptions  Medication Sig Dispense Refill  . naproxen (NAPROSYN) 500 MG tablet Take 500 mg by mouth 2 (two) times daily.      . traZODone (DESYREL) 50 MG tablet Take 50 mg by mouth at bedtime.       No current facility-administered medications for this visit.     No past surgical history on file.   Allergies  Allergen Reactions  . Toradol [Ketorolac Tromethamine]   . Tramadol Other (See Comments)    "Passed out for 4 hours, vomited uncontrollably"  . Strawberry Itching and Rash      No family history on file.   Social History Ms. Turano reports that she has been smoking Cigarettes.  She started smoking about 4 years ago. She has a 5 pack-year smoking history. She has never used smokeless tobacco. Ms. Herrig reports that she does not drink alcohol.   Review of Systems CONSTITUTIONAL: No weight loss, fever, chills, weakness or fatigue.  HEENT: Eyes: No visual loss, blurred vision, double vision or yellow sclerae.No hearing loss, sneezing, congestion, runny nose or sore throat.  SKIN:  No rash or itching.  CARDIOVASCULAR: per HPI RESPIRATORY: No shortness of breath, cough or sputum.  GASTROINTESTINAL: No anorexia, nausea, vomiting or diarrhea. No abdominal pain or blood.  GENITOURINARY: No burning on urination, no polyuria NEUROLOGICAL: No headache, dizziness, syncope, paralysis, ataxia, numbness or tingling in the extremities. No change in bowel or bladder control.  MUSCULOSKELETAL: + joint pains LYMPHATICS: No enlarged nodes. No history of splenectomy.  PSYCHIATRIC: No history of depression or anxiety.  ENDOCRINOLOGIC: No reports of  sweating, cold or heat intolerance. No polyuria or polydipsia.  Marland Kitchen   Physical Examination p 76 bp 133/89 Wt 168 lbs BMI 30 Gen: resting comfortably, no acute distress HEENT: no scleral icterus, pupils equal round and reactive, no palptable cervical adenopathy,  CV: RRR, no m/r/g, no JVD, no carotid bruits Resp: Clear to auscultation bilaterally GI: abdomen is soft, non-tender, non-distended, normal bowel sounds, no hepatosplenomegaly MSK: extremities are warm, no edema.  Skin: warm, no rash Neuro:  no focal deficits Psych: appropriate affect   Diagnostic Studies 04/14/14 Clinic EKG  NSR  05/19/14 Echo  Study Conclusions  - Left ventricle: The cavity size was normal. Wall thickness was increased in a pattern of mild LVH. Systolic function was normal. The estimated ejection fraction was in the range of 60% to 65%. Wall motion was normal; there were no regional wall motion abnormalities. Left ventricular diastolic function parameters were normal. - Mitral valve: There was trivial regurgitation.   11/2012 Echo [facility]  LVEF 55-60%, no WMAs, no effusion     Assessment and Plan  1. Atypical chest pain  - likely MSK pain related to her rheumatologic disease, no evidence of recurrent pericarditis at this time. Symptoms improved with NSAIDs, previously signifcantly improved with prednisone in the past - no further cardiac testing at this time  2. Palpitations  - fairly infrequent, occur only once a month  - counseled to decrease caffeine intake, no further workup at this time   F/u 1 year       Antoine Poche, M.D

## 2014-05-13 ENCOUNTER — Other Ambulatory Visit: Payer: Medicaid Other

## 2014-07-10 ENCOUNTER — Emergency Department (HOSPITAL_COMMUNITY)
Admission: EM | Admit: 2014-07-10 | Discharge: 2014-07-10 | Disposition: A | Discharge status: Home / Self Care | Payer: Medicaid Other | Attending: Emergency Medicine | Admitting: Emergency Medicine

## 2014-07-10 ENCOUNTER — Encounter (HOSPITAL_COMMUNITY): Payer: Self-pay | Admitting: Emergency Medicine

## 2014-07-10 DIAGNOSIS — Z79899 Other long term (current) drug therapy: Secondary | ICD-10-CM | POA: Diagnosis not present

## 2014-07-10 DIAGNOSIS — Z720 Tobacco use: Secondary | ICD-10-CM | POA: Diagnosis not present

## 2014-07-10 DIAGNOSIS — J029 Acute pharyngitis, unspecified: Secondary | ICD-10-CM | POA: Insufficient documentation

## 2014-07-10 DIAGNOSIS — M069 Rheumatoid arthritis, unspecified: Secondary | ICD-10-CM | POA: Insufficient documentation

## 2014-07-10 DIAGNOSIS — Z789 Other specified health status: Secondary | ICD-10-CM

## 2014-07-10 DIAGNOSIS — Z791 Long term (current) use of non-steroidal anti-inflammatories (NSAID): Secondary | ICD-10-CM | POA: Diagnosis not present

## 2014-07-10 DIAGNOSIS — Z8679 Personal history of other diseases of the circulatory system: Secondary | ICD-10-CM | POA: Insufficient documentation

## 2014-07-10 MED ORDER — MAGIC MOUTHWASH W/LIDOCAINE: 5.0000 mL | Freq: Three times a day (TID) | ORAL | Status: DC | PRN

## 2014-07-10 MED ORDER — LIDOCAINE VISCOUS 2 % MT SOLN
15.0000 mL | Freq: Once | OROMUCOSAL | Status: AC
  Administered 2014-07-10: 15 mL via OROMUCOSAL
  Filled 2014-07-10: qty 15

## 2014-07-10 NOTE — ED Notes (Signed)
Pt states that she woke up about 30 mins ago with her throat burning and felt like it was closing up on her.  Pt states that she bought a new vapor cigarette  Yesterday and smoked on it all day up until 1130 last night.  Pt states that she has GERD but states this burning is different.

## 2014-07-10 NOTE — Discharge Instructions (Signed)
All the reports we read indicate that "VAPING" causes burning throat due to chemical in it and dry throat.  DRY throat in fact is the most common causes for burning of the throat.  PLEASE HYDRATE MORE, stop "vaping" for now. Consult the manufacturer on their guideline on if it is safe to continue vaping, and what can be done to avoid the burning.

## 2014-07-10 NOTE — ED Notes (Signed)
Pt reports using new vapor cigarette last night with complaint of burning in throat and pain with swallowing. Pt reports symptoms better now than last night. Pt hx of  GERD; denies CP; reports ONLY throat pain.

## 2014-07-17 NOTE — ED Provider Notes (Signed)
CSN: [id number]     Arrival date & time 07/10/14  0704 History   First MD Initiated Contact with Patient 07/10/14 [phone]     Chief Complaint  Patient presents with  . Sore Throat     (Consider location/radiation/quality/duration/timing/severity/associated sxs/prior Treatment) HPI Comments: Pt started using a vapor cigarette yday and smoked all day at the highest setting. Started having sore throat overnight, moderately severe.  Patient is a 23 y.o. female presenting with pharyngitis. The history is provided by the patient.  Sore Throat This is a new problem. The current episode started 3 to 5 hours ago. The problem occurs constantly. The problem has been gradually worsening. Pertinent negatives include no shortness of breath. She has tried nothing for the symptoms.    Past Medical History  Diagnosis Date  . RA (rheumatoid arthritis)   . Pericarditis   . Still's disease   . Still's disease    History reviewed. No pertinent past surgical history. No family history on file. History  Substance Use Topics  . Smoking status: Current Every Day Smoker -- 1.00 packs/day for 5 years    Types: Cigarettes    Start date: 07/27/2009  . Smokeless tobacco: Never Used     Comment: patient smoked her last cigarette 3 days ago 05/05/14 due to having a tooth pulled  . Alcohol Use: No   OB History    No data available     Review of Systems  HENT: Positive for sore throat. Negative for drooling.   Respiratory: Negative for cough and shortness of breath.   Gastrointestinal: Negative for vomiting.  Musculoskeletal: Negative for neck pain.      Allergies  Toradol; Tramadol; and Strawberry  Home Medications   Prior to Admission medications   Medication Sig Start Date End Date Taking? Authorizing Provider  naproxen (NAPROSYN) 500 MG tablet Take 500 mg by mouth 2 (two) times daily.   Yes Historical Provider, MD  phentermine 37.5 MG capsule Take 37.5 mg by mouth every morning.   Yes  Historical Provider, MD  Alum & Mag Hydroxide-Simeth (MAGIC MOUTHWASH W/LIDOCAINE) SOLN Take 5 mLs by mouth 3 (three) times daily as needed for mouth pain. 07/10/14   Bolton Canupp Rhunette Croft, MD   BP 108/66 mmHg  Pulse 81  Temp(Src) 97.9 F (36.6 C) (Oral)  Resp 16  SpO2 100%  LMP 07/09/2014 Physical Exam  Constitutional: She appears well-developed.  HENT:  Head: Normocephalic and atraumatic.  Mouth/Throat: No oropharyngeal exudate.  Eyes: Conjunctivae are normal.  Cardiovascular: Normal rate.   Pulmonary/Chest: Effort normal.  Lymphadenopathy:    She has no cervical adenopathy.  Nursing note and vitals reviewed.   ED Course  Procedures (including critical care time) Labs Review Labs Reviewed - No data to display  Imaging Review No results found.   EKG Interpretation None      MDM   Final diagnoses:  Nicotine vapor product user  Pharyngitis    Pt has sore throat. Likely from dryness -as manufacturer warning reports that using their cigarette in high setting can cause dryness and sore throat. Pt advised to hydrate well, and to slowly increase the use as tolerated.  Derwood Kaplan, MD 07/17/14 [phone]

## 2014-11-10 ENCOUNTER — Encounter (HOSPITAL_COMMUNITY): Payer: Self-pay | Admitting: Emergency Medicine

## 2014-11-10 ENCOUNTER — Emergency Department (HOSPITAL_COMMUNITY)
Admission: EM | Admit: 2014-11-10 | Discharge: 2014-11-10 | Disposition: A | Discharge status: Home / Self Care | Payer: Medicaid Other | Attending: Emergency Medicine | Admitting: Emergency Medicine

## 2014-11-10 DIAGNOSIS — Z720 Tobacco use: Secondary | ICD-10-CM | POA: Diagnosis not present

## 2014-11-10 DIAGNOSIS — X58XXXA Exposure to other specified factors, initial encounter: Secondary | ICD-10-CM | POA: Insufficient documentation

## 2014-11-10 DIAGNOSIS — S96912A Strain of unspecified muscle and tendon at ankle and foot level, left foot, initial encounter: Secondary | ICD-10-CM | POA: Diagnosis not present

## 2014-11-10 DIAGNOSIS — Z8679 Personal history of other diseases of the circulatory system: Secondary | ICD-10-CM | POA: Diagnosis not present

## 2014-11-10 DIAGNOSIS — M069 Rheumatoid arthritis, unspecified: Secondary | ICD-10-CM | POA: Insufficient documentation

## 2014-11-10 DIAGNOSIS — Y998 Other external cause status: Secondary | ICD-10-CM | POA: Diagnosis not present

## 2014-11-10 DIAGNOSIS — Y9289 Other specified places as the place of occurrence of the external cause: Secondary | ICD-10-CM | POA: Diagnosis not present

## 2014-11-10 DIAGNOSIS — Y9389 Activity, other specified: Secondary | ICD-10-CM | POA: Insufficient documentation

## 2014-11-10 DIAGNOSIS — S99912A Unspecified injury of left ankle, initial encounter: Secondary | ICD-10-CM | POA: Diagnosis present

## 2014-11-10 NOTE — ED Provider Notes (Signed)
CSN: [id number]     Arrival date & time 11/10/14  1442 History   First MD Initiated Contact with Patient 11/10/14 1555     Chief Complaint  Patient presents with  . Ankle Pain     (Consider location/radiation/quality/duration/timing/severity/associated sxs/prior Treatment) HPI Comments: Patient is a 24 year old female who presents to the emergency department with left ankle pain. The patient states that on Monday, April 11 when she got up she had severe pain in the left ankle. She has been trying to stay off of it, taking medication at home but with minimal relief. She tried Tylenol with codeine earlier this morning with only minimal relief. The patient has not had any previous operations or procedures on the left ankle. She does not recall any direct trauma. The patient states that she has a condition in which she will need to have her left hip replaced later this year.  Patient is a 24 y.o. female presenting with ankle pain. The history is provided by the patient.  Ankle Pain Location:  Ankle Ankle location:  L ankle Associated symptoms: no back pain and no neck pain     Past Medical History  Diagnosis Date  . RA (rheumatoid arthritis)   . Pericarditis   . Still's disease   . Still's disease    History reviewed. No pertinent past surgical history. History reviewed. No pertinent family history. History  Substance Use Topics  . Smoking status: Current Every Day Smoker -- 1.00 packs/day for 5 years    Types: Cigarettes    Start date: 07/27/2009  . Smokeless tobacco: Never Used     Comment: patient smoked her last cigarette 3 days ago 05/05/14 due to having a tooth pulled  . Alcohol Use: No   OB History    No data available     Review of Systems  Constitutional: Negative for activity change.       All ROS Neg except as noted in HPI  HENT: Negative for nosebleeds.   Eyes: Negative for photophobia and discharge.  Respiratory: Negative for cough, shortness of breath and  wheezing.   Cardiovascular: Negative for chest pain and palpitations.  Gastrointestinal: Negative for abdominal pain and blood in stool.  Genitourinary: Negative for dysuria, frequency and hematuria.  Musculoskeletal: Positive for arthralgias and gait problem. Negative for back pain and neck pain.  Skin: Negative.   Neurological: Negative for dizziness, seizures and speech difficulty.  Psychiatric/Behavioral: Negative for hallucinations and confusion.      Allergies  Toradol; Tramadol; and Strawberry  Home Medications   Prior to Admission medications   Medication Sig Start Date End Date Taking? Authorizing Provider  acetaminophen-codeine (TYLENOL #3) 300-30 MG per tablet Take 1 tablet by mouth every 6 (six) hours as needed for moderate pain or severe pain.  09/29/14  Yes Historical Provider, MD  HYDROcodone-acetaminophen (NORCO) 10-325 MG per tablet Take 0.5-1 tablets by mouth every 4 (four) hours as needed for moderate pain. for pain 10/14/14  Yes Historical Provider, MD  omeprazole (PRILOSEC) 20 MG capsule Take 20 mg by mouth daily as needed (for acid reflux/GERD).  09/10/14  Yes Historical Provider, MD  traZODone (DESYREL) 50 MG tablet Take 50 mg by mouth at bedtime as needed for sleep.    Yes Historical Provider, MD  Alum & Mag Hydroxide-Simeth (MAGIC MOUTHWASH W/LIDOCAINE) SOLN Take 5 mLs by mouth 3 (three) times daily as needed for mouth pain. Patient not taking: Reported on 11/10/2014 07/10/14   Derwood Kaplan, MD   BP  122/73 mmHg  Pulse 89  Temp(Src) 99.2 F (37.3 C) (Oral)  Resp 20  Ht 5\' 3"  (1.6 m)  Wt 230 lb (104.327 kg)  BMI 40.75 kg/m2  SpO2 99%  LMP 09/01/2014 Physical Exam  Constitutional: She is oriented to person, place, and time. She appears well-developed and well-nourished.  Non-toxic appearance.  HENT:  Head: Normocephalic.  Right Ear: Tympanic membrane and external ear normal.  Left Ear: Tympanic membrane and external ear normal.  Eyes: EOM and lids are  normal. Pupils are equal, round, and reactive to light.  Neck: Normal range of motion. Neck supple. Carotid bruit is not present.  Cardiovascular: Normal rate, regular rhythm, normal heart sounds, intact distal pulses and normal pulses.   Pulmonary/Chest: Breath sounds normal. No respiratory distress.  Abdominal: Soft. Bowel sounds are normal. There is no tenderness. There is no guarding.  Musculoskeletal: Normal range of motion.  There is pain with attempted range of motion of the left hip. This is not new. There is pain to touch as well as attempted range of motion of the left ankle. Medial more than lateral. There is no deformity of the left foot. Dorsalis pedis and posterior tibial pulses are 2+. Capillary refill is less then 2 seconds.  Lymphadenopathy:       Head (right side): No submandibular adenopathy present.       Head (left side): No submandibular adenopathy present.    She has no cervical adenopathy.  Neurological: She is alert and oriented to person, place, and time. She has normal strength. No cranial nerve deficit or sensory deficit.  No gross neurologic deficits appreciated of the lower extremities.  Skin: Skin is warm and dry.  Psychiatric: She has a normal mood and affect. Her speech is normal.  Nursing note and vitals reviewed.   ED Course  Procedures (including critical care time) Labs Review Labs Reviewed - No data to display  Imaging Review No results found.   EKG Interpretation None      MDM  Patient has a history of degeneration involving the left hip. She will require a hip replacement later this year. She now has pain involving the foot and ankle on the left. Suspect an ankle strain. Patient will be fitted with an ankle stirrup splint. She is seen by orthopedic specialist at the [facility]. I have asked her to see the specialist there for additional evaluation.    Final diagnoses:  Ankle strain, left, initial encounter    *I have  reviewed nursing notes, vital signs, and all appropriate lab and imaging results for this patient.**    [facility], PA-C 11/10/14 1631  01/10/15, DO 11/12/14 [phone]

## 2014-11-10 NOTE — Discharge Instructions (Signed)
Your examination suggest ankle strain. Please use the ankle stirrup splint for the next 7-10 days. Please elevate her ankle is much as possible. Please see your orthopedist with the [facility] for additional evaluation and management. Ligament Sprain Ligaments are tough, fibrous tissues that hold bones together at the joints. A sprain can occur when a ligament is stretched. This injury may take several weeks to heal. HOME CARE INSTRUCTIONS   Rest the injured area for as long as directed by your caregiver. Then slowly start using the joint as directed by your caregiver and as the pain allows.  Keep the affected joint raised if possible to lessen swelling.  Apply ice for 15-20 minutes to the injured area every couple hours for the first half day, then 03-04 times per day for the first 48 hours. Put the ice in a plastic bag and place a towel between the bag of ice and your skin.  Wear any splinting, casting, or elastic bandage applications as instructed.  Only take over-the-counter or prescription medicines for pain, discomfort, or fever as directed by your caregiver. Do not use aspirin immediately after the injury unless instructed by your caregiver. Aspirin can cause increased bleeding and bruising of the tissues.  If you were given crutches, continue to use them as instructed and do not resume weight bearing on the affected extremity until instructed. SEEK MEDICAL CARE IF:   Your bruising, swelling, or pain increases.  You have cold and numb fingers or toes if your arm or leg was injured. SEEK IMMEDIATE MEDICAL CARE IF:   Your toes are numb or blue if your leg was injured.  Your fingers are numb or blue if your arm was injured.  Your pain is not responding to medicines and continues to stay the same or gets worse. MAKE SURE YOU:   Understand these instructions.  Will watch your condition.  Will get help right away if you are not doing well or get  worse. Document Released: 07/14/2000 Document Revised: 10/09/2011 Document Reviewed: 05/12/2008 [facility] Patient Information 2015 [place], [place]. This information is not intended to replace advice given to you by your health care provider. Make sure you discuss any questions you have with your health care provider.

## 2014-11-10 NOTE — ED Notes (Signed)
Got up Monday morning and while getting OOB, had pain to left ankle.  Rates pain 7/10.  Pain increases with walking.  Took tylenol #3 at 10 am this am, without relief.

## 2014-11-21 ENCOUNTER — Emergency Department (HOSPITAL_BASED_OUTPATIENT_CLINIC_OR_DEPARTMENT_OTHER)
Admission: EM | Admit: 2014-11-21 | Discharge: 2014-11-21 | Disposition: A | Discharge status: Home / Self Care | Payer: Medicaid Other | Attending: Emergency Medicine | Admitting: Emergency Medicine

## 2014-11-21 ENCOUNTER — Emergency Department (HOSPITAL_BASED_OUTPATIENT_CLINIC_OR_DEPARTMENT_OTHER): Payer: Medicaid Other

## 2014-11-21 ENCOUNTER — Encounter (HOSPITAL_BASED_OUTPATIENT_CLINIC_OR_DEPARTMENT_OTHER): Payer: Self-pay

## 2014-11-21 DIAGNOSIS — R112 Nausea with vomiting, unspecified: Secondary | ICD-10-CM

## 2014-11-21 DIAGNOSIS — M069 Rheumatoid arthritis, unspecified: Secondary | ICD-10-CM | POA: Insufficient documentation

## 2014-11-21 DIAGNOSIS — Z8679 Personal history of other diseases of the circulatory system: Secondary | ICD-10-CM | POA: Diagnosis not present

## 2014-11-21 DIAGNOSIS — Z3202 Encounter for pregnancy test, result negative: Secondary | ICD-10-CM | POA: Insufficient documentation

## 2014-11-21 DIAGNOSIS — Z79899 Other long term (current) drug therapy: Secondary | ICD-10-CM | POA: Insufficient documentation

## 2014-11-21 DIAGNOSIS — Z87891 Personal history of nicotine dependence: Secondary | ICD-10-CM | POA: Insufficient documentation

## 2014-11-21 LAB — COMPREHENSIVE METABOLIC PANEL
ALT: 18 U/L (ref 0–35)
ANION GAP: 6 (ref 5–15)
AST: 16 U/L (ref 0–37)
Albumin: 4.2 g/dL (ref 3.5–5.2)
Alkaline Phosphatase: 66 U/L (ref 39–117)
BUN: 9 mg/dL (ref 6–23)
CALCIUM: 9 mg/dL (ref 8.4–10.5)
CO2: 26 mmol/L (ref 19–32)
Chloride: 106 mmol/L (ref 96–112)
Creatinine, Ser: 0.85 mg/dL (ref 0.50–1.10)
GFR calc Af Amer: >90 mL/min (ref >90)
GFR calc non Af Amer: >90 mL/min (ref >90)
Glucose, Bld: 91 mg/dL (ref 70–99)
Potassium: 3.8 mmol/L (ref 3.5–5.1)
SODIUM: 138 mmol/L (ref 135–145)
Total Bilirubin: 0.9 mg/dL (ref 0.3–1.2)
Total Protein: 7.8 g/dL (ref 6.0–8.3)

## 2014-11-21 LAB — RAPID URINE DRUG SCREEN, HOSP PERFORMED
AMPHETAMINES: NOT DETECTED
BARBITURATES: NOT DETECTED
Benzodiazepines: NOT DETECTED
COCAINE: NOT DETECTED
Opiates: NOT DETECTED
TETRAHYDROCANNABINOL: NOT DETECTED

## 2014-11-21 LAB — CBC WITH DIFFERENTIAL/PLATELET
Basophils Absolute: 0 10*3/uL (ref 0.0–0.1)
Basophils Relative: 0 % (ref 0–1)
EOS ABS: 0.2 10*3/uL (ref 0.0–0.7)
EOS PCT: 2 % (ref 0–5)
HCT: 41.3 % (ref 36.0–46.0)
Hemoglobin: 13.7 g/dL (ref 12.0–15.0)
LYMPHS ABS: 2.3 10*3/uL (ref 0.7–4.0)
Lymphocytes Relative: 26 % (ref 12–46)
MCH: 29.3 pg (ref 26.0–34.0)
MCHC: 33.2 g/dL (ref 30.0–36.0)
MCV: 88.2 fL (ref 78.0–100.0)
MONO ABS: 0.5 10*3/uL (ref 0.1–1.0)
Monocytes Relative: 5 % (ref 3–12)
Neutro Abs: 6.2 10*3/uL (ref 1.7–7.7)
Neutrophils Relative %: 67 % (ref 43–77)
PLATELETS: 266 10*3/uL (ref 150–400)
RBC: 4.68 MIL/uL (ref 3.87–5.11)
RDW: 14.6 % (ref 11.5–15.5)
WBC: 9.2 10*3/uL (ref 4.0–10.5)

## 2014-11-21 LAB — URINALYSIS, ROUTINE W REFLEX MICROSCOPIC
Bilirubin Urine: NEGATIVE
GLUCOSE, UA: NEGATIVE mg/dL
HGB URINE DIPSTICK: NEGATIVE
KETONES UR: NEGATIVE mg/dL
LEUKOCYTES UA: NEGATIVE
Nitrite: NEGATIVE
PH: 7 (ref 5.0–8.0)
Protein, ur: NEGATIVE mg/dL
Specific Gravity, Urine: 1.022 (ref 1.005–1.030)
Urobilinogen, UA: 1 mg/dL (ref 0.0–1.0)

## 2014-11-21 LAB — PREGNANCY, URINE: Preg Test, Ur: NEGATIVE

## 2014-11-21 LAB — LIPASE, BLOOD: Lipase: 23 U/L (ref 11–59)

## 2014-11-21 MED ORDER — ONDANSETRON HCL 4 MG/2ML IJ SOLN
4.0000 mg | Freq: Once | INTRAMUSCULAR | Status: AC
  Administered 2014-11-21: 4 mg via INTRAVENOUS
  Filled 2014-11-21: qty 2

## 2014-11-21 MED ORDER — ONDANSETRON 4 MG PO TBDP: ORAL_TABLET | ORAL | Status: DC

## 2014-11-21 MED ORDER — SODIUM CHLORIDE 0.9 % IV BOLUS (SEPSIS)
1000.0000 mL | Freq: Once | INTRAVENOUS | Status: AC
  Administered 2014-11-21: 1000 mL via INTRAVENOUS

## 2014-11-21 NOTE — ED Notes (Signed)
Tolerated fluids

## 2014-11-21 NOTE — Discharge Instructions (Signed)
Please call your doctor for a followup appointment within 24-48 hours. When you talk to your doctor please let them know that you were seen in the emergency department and have them acquire all of your records so that they can discuss the findings with you and formulate a treatment plan to fully care for your new and ongoing problems. Please follow-up with your primary care provider Please avoid any physical or strenuous activity Please rest and stay hydrated-please drink plenty of fluids Please avoid foods that are high in fat, grease, oil Please continue to monitor symptoms closely and if symptoms are to worsen or change (fever greater than 101, chills, sweating, nausea, vomiting, chest pain, shortness of breathe, difficulty breathing, weakness, numbness, tingling, worsening or changes to pain pattern, inability keep food or fluids down, stomach pain, blood in stools, black tarry stools, diarrhea, burning with urination, blood in the urine, decreased urination) please report back to the Emergency Department immediately.   Nausea and Vomiting Nausea is a sick feeling that often comes before throwing up (vomiting). Vomiting is a reflex where stomach contents come out of your mouth. Vomiting can cause severe loss of body fluids (dehydration). Children and elderly adults can become dehydrated quickly, especially if they also have diarrhea. Nausea and vomiting are symptoms of a condition or disease. It is important to find the cause of your symptoms. CAUSES   Direct irritation of the stomach lining. This irritation can result from increased acid production (gastroesophageal reflux disease), infection, food poisoning, taking certain medicines (such as nonsteroidal anti-inflammatory drugs), alcohol use, or tobacco use.  Signals from the brain.These signals could be caused by a headache, heat exposure, an inner ear disturbance, increased pressure in the brain from injury, infection, a tumor, or a  concussion, pain, emotional stimulus, or metabolic problems.  An obstruction in the gastrointestinal tract (bowel obstruction).  Illnesses such as diabetes, hepatitis, gallbladder problems, appendicitis, kidney problems, cancer, sepsis, atypical symptoms of a heart attack, or eating disorders.  Medical treatments such as chemotherapy and radiation.  Receiving medicine that makes you sleep (general anesthetic) during surgery. DIAGNOSIS Your caregiver may ask for tests to be done if the problems do not improve after a few days. Tests may also be done if symptoms are severe or if the reason for the nausea and vomiting is not clear. Tests may include:  Urine tests.  Blood tests.  Stool tests.  Cultures (to look for evidence of infection).  X-rays or other imaging studies. Test results can help your caregiver make decisions about treatment or the need for additional tests. TREATMENT You need to stay well hydrated. Drink frequently but in small amounts.You may wish to drink water, sports drinks, clear broth, or eat frozen ice pops or gelatin dessert to help stay hydrated.When you eat, eating slowly may help prevent nausea.There are also some antinausea medicines that may help prevent nausea. HOME CARE INSTRUCTIONS   Take all medicine as directed by your caregiver.  If you do not have an appetite, do not force yourself to eat. However, you must continue to drink fluids.  If you have an appetite, eat a normal diet unless your caregiver tells you differently.  Eat a variety of complex carbohydrates (rice, wheat, potatoes, bread), lean meats, yogurt, fruits, and vegetables.  Avoid high-fat foods because they are more difficult to digest.  Drink enough water and fluids to keep your urine clear or pale yellow.  If you are dehydrated, ask your caregiver for specific rehydration instructions. Signs of  dehydration may include:  Severe thirst.  Dry lips and mouth.  Dizziness.  Dark  urine.  Decreasing urine frequency and amount.  Confusion.  Rapid breathing or pulse. SEEK IMMEDIATE MEDICAL CARE IF:   You have blood or brown flecks (like coffee grounds) in your vomit.  You have black or bloody stools.  You have a severe headache or stiff neck.  You are confused.  You have severe abdominal pain.  You have chest pain or trouble breathing.  You do not urinate at least once every 8 hours.  You develop cold or clammy skin.  You continue to vomit for longer than 24 to 48 hours.  You have a fever. MAKE SURE YOU:   Understand these instructions.  Will watch your condition.  Will get help right away if you are not doing well or get worse. Document Released: 07/17/2005 Document Revised: 10/09/2011 Document Reviewed: 12/14/2010 [facility] Patient Information 2015 [place], [place]. This information is not intended to replace advice given to you by your health care provider. Make sure you discuss any questions you have with your health care provider.

## 2014-11-21 NOTE — ED Provider Notes (Signed)
CSN: [id number]     Arrival date & time 11/21/14  1519 History   First MD Initiated Contact with Patient 11/21/14 1651     Chief Complaint  Patient presents with  . Emesis     (Consider location/radiation/quality/duration/timing/severity/associated sxs/prior Treatment) The history is provided by the patient. No language interpreter was used.  [name] is a 24 year old female with past medical history of pericarditis, rheumatoid arthritis, still's disease presenting to the ED with nausea that has been ongoing for approximately one week beginning of emesis yesterday. Patient reported that she threw up approximately 8 times yesterday mainly of food followed by stomach contents-NB/NB. Patient reported that she has not had any episodes of emesis today, but continues to feel nauseous. Patient reported that she's been feeling tired secondary to continuous vomiting yesterday. Denied fever, chills, neck pain, neck stiffness, chest pain, shortness of breath, difficulty breathing, abdominal pain, melena, hematochezia, diarrhea, blurred vision, sudden loss of vision, headache, dizziness, numbness, tingling, weakness, travel, hemoptysis, cough. PCP Dr. Vernice Jefferson  Past Medical History  Diagnosis Date  . RA (rheumatoid arthritis)   . Pericarditis   . Still's disease   . Still's disease   . Still's disease    History reviewed. No pertinent past surgical history. No family history on file. History  Substance Use Topics  . Smoking status: Former Smoker -- 1.00 packs/day for 5 years    Types: Cigarettes    Start date: 07/27/2009  . Smokeless tobacco: Never Used     Comment: patient smoked her last cigarette 3 days ago 05/05/14 due to having a tooth pulled  . Alcohol Use: No   OB History    No data available     Review of Systems  Constitutional: Negative for fever and chills.  Eyes: Negative for visual disturbance.  Respiratory: Negative for chest tightness and shortness of breath.    Cardiovascular: Negative for chest pain.  Gastrointestinal: Positive for nausea and vomiting. Negative for abdominal pain, diarrhea, constipation, blood in stool and anal bleeding.  Genitourinary: Negative for dysuria, hematuria, decreased urine volume, vaginal bleeding, vaginal discharge, vaginal pain and pelvic pain.  Musculoskeletal: Negative for back pain, neck pain and neck stiffness.  Neurological: Negative for dizziness, weakness and headaches.      Allergies  Toradol; Tramadol; and Strawberry  Home Medications   Prior to Admission medications   Medication Sig Start Date End Date Taking? Authorizing Provider  docusate sodium (COLACE) 100 MG capsule Take 100 mg by mouth 2 (two) times daily.   Yes Historical Provider, MD  acetaminophen-codeine (TYLENOL #3) 300-30 MG per tablet Take 1 tablet by mouth every 6 (six) hours as needed for moderate pain or severe pain.  09/29/14   Historical Provider, MD  Alum & Mag Hydroxide-Simeth (MAGIC MOUTHWASH W/LIDOCAINE) SOLN Take 5 mLs by mouth 3 (three) times daily as needed for mouth pain. Patient not taking: Reported on 11/10/2014 07/10/14   Derwood Kaplan, MD  HYDROcodone-acetaminophen (NORCO) 10-325 MG per tablet Take 0.5-1 tablets by mouth every 4 (four) hours as needed for moderate pain. for pain 10/14/14   Historical Provider, MD  omeprazole (PRILOSEC) 20 MG capsule Take 20 mg by mouth daily as needed (for acid reflux/GERD).  09/10/14   Historical Provider, MD  ondansetron (ZOFRAN ODT) 4 MG disintegrating tablet 4mg  ODT q4 hours prn nausea/vomit. Please no more than 8 mg per day. 11/21/14   Gehrig Patras, PA-C  traZODone (DESYREL) 50 MG tablet Take 50 mg by mouth at bedtime as needed  for sleep.     Historical Provider, MD   BP 111/76 mmHg  Pulse 63  Temp(Src) 99 F (37.2 C) (Oral)  Resp 18  Ht 5\' 3"  (1.6 m)  Wt 230 lb (104.327 kg)  BMI 40.75 kg/m2  SpO2 99%  LMP 11/13/2014 Physical Exam  Constitutional: She is oriented to person,  place, and time. She appears well-developed and well-nourished. No distress.  HENT:  Head: Normocephalic and atraumatic.  Mouth/Throat: Oropharynx is clear and moist. No oropharyngeal exudate.  Eyes: Conjunctivae and EOM are normal. Pupils are equal, round, and reactive to light. Right eye exhibits no discharge. Left eye exhibits no discharge.  Neck: Normal range of motion. Neck supple. No tracheal deviation present.  Cardiovascular: Normal rate, regular rhythm and normal heart sounds.  Exam reveals no friction rub.   No murmur heard. Pulmonary/Chest: Effort normal and breath sounds normal. No respiratory distress. She has no wheezes. She has no rales.  Abdominal: Soft. Bowel sounds are normal. She exhibits no distension. There is no tenderness. There is no rebound, no guarding and no CVA tenderness.  Musculoskeletal: Normal range of motion.  Lymphadenopathy:    She has no cervical adenopathy.  Neurological: She is alert and oriented to person, place, and time. No cranial nerve deficit. She exhibits normal muscle tone. Coordination normal.  Skin: Skin is warm and dry. No rash noted. She is not diaphoretic. No erythema.  Psychiatric: She has a normal mood and affect. Her behavior is normal. Thought content normal.  Nursing note and vitals reviewed.   ED Course  Procedures (including critical care time)  Results for orders placed or performed during the hospital encounter of 11/21/14  Urinalysis, Routine w reflex microscopic  Result Value Ref Range   Color, Urine YELLOW YELLOW   APPearance CLEAR CLEAR   Specific Gravity, Urine 1.022 1.005 - 1.030   pH 7.0 5.0 - 8.0   Glucose, UA NEGATIVE NEGATIVE mg/dL   Hgb urine dipstick NEGATIVE NEGATIVE   Bilirubin Urine NEGATIVE NEGATIVE   Ketones, ur NEGATIVE NEGATIVE mg/dL   Protein, ur NEGATIVE NEGATIVE mg/dL   Urobilinogen, UA 1.0 0.0 - 1.0 mg/dL   Nitrite NEGATIVE NEGATIVE   Leukocytes, UA NEGATIVE NEGATIVE  Pregnancy, urine  Result  Value Ref Range   Preg Test, Ur NEGATIVE NEGATIVE  CBC with Differential/Platelet  Result Value Ref Range   WBC 9.2 4.0 - 10.5 K/uL   RBC 4.68 3.87 - 5.11 MIL/uL   Hemoglobin 13.7 12.0 - 15.0 g/dL   HCT 11/23/14 25.4 - 27.0 %   MCV 88.2 78.0 - 100.0 fL   MCH 29.3 26.0 - 34.0 pg   MCHC 33.2 30.0 - 36.0 g/dL   RDW 62.3 76.2 - 83.1 %   Platelets 266 150 - 400 K/uL   Neutrophils Relative % 67 43 - 77 %   Neutro Abs 6.2 1.7 - 7.7 K/uL   Lymphocytes Relative 26 12 - 46 %   Lymphs Abs 2.3 0.7 - 4.0 K/uL   Monocytes Relative 5 3 - 12 %   Monocytes Absolute 0.5 0.1 - 1.0 K/uL   Eosinophils Relative 2 0 - 5 %   Eosinophils Absolute 0.2 0.0 - 0.7 K/uL   Basophils Relative 0 0 - 1 %   Basophils Absolute 0.0 0.0 - 0.1 K/uL  Comprehensive metabolic panel  Result Value Ref Range   Sodium 138 135 - 145 mmol/L   Potassium 3.8 3.5 - 5.1 mmol/L   Chloride 106 96 - 112 mmol/L  CO2 26 19 - 32 mmol/L   Glucose, Bld 91 70 - 99 mg/dL   BUN 9 6 - 23 mg/dL   Creatinine, Ser 4.00 0.50 - 1.10 mg/dL   Calcium 9.0 8.4 - 86.7 mg/dL   Total Protein 7.8 6.0 - 8.3 g/dL   Albumin 4.2 3.5 - 5.2 g/dL   AST 16 0 - 37 U/L   ALT 18 0 - 35 U/L   Alkaline Phosphatase 66 39 - 117 U/L   Total Bilirubin 0.9 0.3 - 1.2 mg/dL   GFR calc non Af Amer >90 >90 mL/min   GFR calc Af Amer >90 >90 mL/min   Anion gap 6 5 - 15  Lipase, blood  Result Value Ref Range   Lipase 23 11 - 59 U/L  Urine rapid drug screen (hosp performed)  Result Value Ref Range   Opiates NONE DETECTED NONE DETECTED   Cocaine NONE DETECTED NONE DETECTED   Benzodiazepines NONE DETECTED NONE DETECTED   Amphetamines NONE DETECTED NONE DETECTED   Tetrahydrocannabinol NONE DETECTED NONE DETECTED   Barbiturates NONE DETECTED NONE DETECTED    Labs Review Labs Reviewed  URINALYSIS, ROUTINE W REFLEX MICROSCOPIC  PREGNANCY, URINE  CBC WITH DIFFERENTIAL/PLATELET  COMPREHENSIVE METABOLIC PANEL  LIPASE, BLOOD  URINE RAPID DRUG SCREEN (HOSP PERFORMED)     Imaging Review Dg Abd Acute W/chest  11/21/2014   CLINICAL DATA:  Nausea for 1 week. Vomiting yesterday. States that she vomits everything she eats or drinks anything.  EXAM: DG ABDOMEN ACUTE W/ 1V CHEST  COMPARISON:  11/10/2014  FINDINGS: There is no evidence of dilated bowel loops or free intraperitoneal air. No radiopaque calculi or other significant radiographic abnormality is seen. Heart size and mediastinal contours are within normal limits. Both lungs are clear.  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  IMPRESSION: Negative abdominal radiographs.  No acute cardiopulmonary disease.   Electronically Signed   By: Andreas Newport M.D.   On: 11/21/2014 19:40     EKG Interpretation None       8:34 PM This provider informed that patient is tolerating ginger ale without difficulty. Negative episodes of emesis in the ED setting.  MDM   Final diagnoses:  Non-intractable vomiting with nausea, vomiting of unspecified type    Medications  sodium chloride 0.9 % bolus 1,000 mL (0 mLs Intravenous Stopped 11/21/14 1831)  ondansetron (ZOFRAN) injection 4 mg (4 mg Intravenous Given 11/21/14 1829)    Filed Vitals:   11/21/14 1524 11/21/14 1526  BP:  111/76  Pulse: 63   Temp: 99 F (37.2 C)   TempSrc: Oral   Resp: 18   Height: 5\' 3"  (1.6 m)   Weight: 230 lb (104.327 kg)   SpO2: 99%    CBC negative elevated leukocytosis. Hemoglobin 13.7, hematocrit 41.3. CMP unremarkable. Lipase negative elevation. Urinalysis negative for hemoglobin, nitrites, leukocytes-negative findings of urinary tract infection or pyelonephritis. Urine pregnancy negative. Urine urine drug screen unremarkable. Plain film of acute abdomen with chest negative for acute abnormalities. Negative findings of UTI or pyelonephritis. Negative findings of pneumonia. Abdominal exam benign-soft, nontender-no imaging needed at this time. Labs reassuring. Patient  given IV fluids and antiemetics in the ED setting with relief. Patient tolerated fluids by mouth without difficulty. Definitive etiology of nausea unknown. Patient stable, afebrile. Patient not septic appearing. Discharged patient. Referred patient to PCP. Discussed with patient to stay hydrated, discussed proper diet. Discussed with patient to closely monitor  symptoms and if symptoms are to worsen or change to report back to the ED - strict return instructions given.  Patient agreed to plan of care, understood, all questions answered.   Raymon Mutton, PA-C 11/21/14 8295  Gerhard Munch, MD 11/22/14 [phone]

## 2014-11-21 NOTE — ED Notes (Signed)
C/O nausea for 1 week.  States that she began vomiting yesterday. States that she vomits everything she eats or drinks anything. Denies pain, diarrhea and fever. Denies blood in vomitus. States that he last episode of vomiting was 2230 last night.

## 2014-11-21 NOTE — ED Notes (Signed)
Ginger ale given. Instructed pt to call for any increase in nausea or with any vomiting.

## 2014-11-21 NOTE — ED Notes (Signed)
Pt reports nausea x 1 week, vomiting started yestererday.  Denies abd pain or diarrhea/fever.

## 2015-06-11 ENCOUNTER — Ambulatory Visit: Payer: Medicaid Other | Admitting: Cardiology

## 2015-07-07 ENCOUNTER — Encounter: Payer: Medicaid Other | Admitting: Cardiology

## 2015-07-07 DIAGNOSIS — R0989 Other specified symptoms and signs involving the circulatory and respiratory systems: Secondary | ICD-10-CM

## 2015-07-07 NOTE — Progress Notes (Signed)
Patient ID: [name], female   DOB: 06-26-1991, 24 y.o.   MRN: [id number] Encounter opened in error

## 2016-03-06 DIAGNOSIS — M2550 Pain in unspecified joint: Secondary | ICD-10-CM | POA: Insufficient documentation

## 2016-05-29 DIAGNOSIS — Z720 Tobacco use: Secondary | ICD-10-CM | POA: Insufficient documentation

## 2016-10-31 DIAGNOSIS — D849 Immunodeficiency, unspecified: Secondary | ICD-10-CM | POA: Insufficient documentation

## 2017-05-03 DIAGNOSIS — Z79899 Other long term (current) drug therapy: Secondary | ICD-10-CM | POA: Insufficient documentation

## 2019-02-13 ENCOUNTER — Other Ambulatory Visit: Payer: Self-pay

## 2019-02-13 ENCOUNTER — Ambulatory Visit: Payer: Medicaid Other | Admitting: Orthopaedic Surgery

## 2019-04-17 ENCOUNTER — Ambulatory Visit: Payer: Medicaid Other | Admitting: Orthopaedic Surgery

## 2019-05-01 ENCOUNTER — Other Ambulatory Visit: Payer: Self-pay

## 2019-05-01 ENCOUNTER — Encounter (HOSPITAL_COMMUNITY): Payer: Self-pay | Admitting: Emergency Medicine

## 2019-05-01 ENCOUNTER — Emergency Department (HOSPITAL_COMMUNITY)
Admission: EM | Admit: 2019-05-01 | Discharge: 2019-05-02 | Disposition: A | Discharge status: Other Acute Inpatient Hospital | Payer: Medicaid Other | Attending: Emergency Medicine | Admitting: Emergency Medicine

## 2019-05-01 DIAGNOSIS — Z20828 Contact with and (suspected) exposure to other viral communicable diseases: Secondary | ICD-10-CM | POA: Diagnosis not present

## 2019-05-01 DIAGNOSIS — R45851 Suicidal ideations: Secondary | ICD-10-CM | POA: Diagnosis not present

## 2019-05-01 DIAGNOSIS — F332 Major depressive disorder, recurrent severe without psychotic features: Secondary | ICD-10-CM | POA: Insufficient documentation

## 2019-05-01 DIAGNOSIS — F329 Major depressive disorder, single episode, unspecified: Secondary | ICD-10-CM | POA: Diagnosis present

## 2019-05-01 HISTORY — DX: Juvenile rheumatoid arthritis with systemic onset, unspecified site: M08.20

## 2019-05-01 LAB — RAPID URINE DRUG SCREEN, HOSP PERFORMED
Amphetamines: NOT DETECTED
Barbiturates: NOT DETECTED
Benzodiazepines: POSITIVE — AB
Cocaine: NOT DETECTED
Opiates: NOT DETECTED
Tetrahydrocannabinol: POSITIVE — AB

## 2019-05-01 LAB — CBC
HCT: 44 % (ref 36.0–46.0)
Hemoglobin: 13.4 g/dL (ref 12.0–15.0)
MCH: 27.5 pg (ref 26.0–34.0)
MCHC: 30.5 g/dL (ref 30.0–36.0)
MCV: 90.2 fL (ref 80.0–100.0)
Platelets: 351 10*3/uL (ref 150–400)
RBC: 4.88 MIL/uL (ref 3.87–5.11)
RDW: 14.6 % (ref 11.5–15.5)
WBC: 16.5 10*3/uL — ABNORMAL HIGH (ref 4.0–10.5)
nRBC: 0 % (ref 0.0–0.2)

## 2019-05-01 LAB — COMPREHENSIVE METABOLIC PANEL
ALT: 46 U/L — ABNORMAL HIGH (ref 0–44)
AST: 32 U/L (ref 15–41)
Albumin: 4.1 g/dL (ref 3.5–5.0)
Alkaline Phosphatase: 85 U/L (ref 38–126)
Anion gap: 9 (ref 5–15)
BUN: 9 mg/dL (ref 6–20)
CO2: 21 mmol/L — ABNORMAL LOW (ref 22–32)
Calcium: 8.9 mg/dL (ref 8.9–10.3)
Chloride: 107 mmol/L (ref 98–111)
Creatinine, Ser: 0.85 mg/dL (ref 0.44–1.00)
GFR calc Af Amer: >60 mL/min (ref >60)
GFR calc non Af Amer: >60 mL/min (ref >60)
Glucose, Bld: 101 mg/dL — ABNORMAL HIGH (ref 70–99)
Potassium: 3.8 mmol/L (ref 3.5–5.1)
Sodium: 137 mmol/L (ref 135–145)
Total Bilirubin: 0.4 mg/dL (ref 0.3–1.2)
Total Protein: 7.9 g/dL (ref 6.5–8.1)

## 2019-05-01 LAB — ETHANOL: Alcohol, Ethyl (B): <10 mg/dL (ref <10)

## 2019-05-01 LAB — SALICYLATE LEVEL: Salicylate Lvl: <7 mg/dL (ref 2.8–30.0)

## 2019-05-01 LAB — ACETAMINOPHEN LEVEL: Acetaminophen (Tylenol), Serum: <10 ug/mL — ABNORMAL LOW (ref 10–30)

## 2019-05-01 NOTE — ED Notes (Signed)
Pt moved to [facility] waiting room to perform TTS Consult.

## 2019-05-01 NOTE — ED Notes (Signed)
Pt allowed to make a phone call. Pt cooperative at this time.

## 2019-05-01 NOTE — ED Provider Notes (Signed)
[facility]-EMERGENCY DEPT [facility] Emergency Department Provider Note MRN:  [id number]  Arrival date & time: 05/01/19     Chief Complaint   Suicidal   History of Present Illness   [name] is a 28 y.o. year-old female with a history of stills disease, rheumatoid arthritis presenting to the ED with chief complaint of suicidal.   2 weeks of worsening depression, multiple life factors getting her down.  The past 1 or 2 days has been having thoughts of wanting to kill herself.  Has not formed a specific plan.  These thoughts began to worry or frighten her today and she is here for evaluation.  She drank alcohol last night but none today, no drugs, no AVH, no HI.  Review of Systems  A complete 10 system review of systems was obtained and all systems are negative except as noted in the HPI and PMH.   Patient's Health History    Past Medical History:  Diagnosis Date  . Pericarditis   . RA (rheumatoid arthritis) (HCC)   . Still disease, 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HCC)   . 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HCC)   . 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HCC)   . Still's disease [facility])     Past Surgical History:  Procedure Laterality Date  . WISDOM TOOTH EXTRACTION      No family history on file.  Social History   Socioeconomic History  . Marital status: Single    Spouse name: Not on file  . Number of children: Not on file  . Years of education: Not on file  . Highest education level: Not on file  Occupational History  . Not on file  Social Needs  . Financial resource strain: Not on file  . Food insecurity    Worry: Not on file    Inability: Not on file  . Transportation needs    Medical: Not on file    Non-medical: Not on file  Tobacco Use  . Smoking status: Current Every Day Smoker    Packs/day: 1.00    Years: 5.00    Pack years: 5.00    Types: Cigarettes    Start date: 07/27/2009  . Smokeless tobacco: Never Used  . Tobacco comment: patient smoked her last cigarette 3 days ago 05/05/14 due to having a  tooth pulled  Substance and Sexual Activity  . Alcohol use: Yes    Comment: beer/liquor... every night x 1 month   . Drug use: Yes    Types: Marijuana  . Sexual activity: Never    Birth control/protection: None  Lifestyle  . Physical activity    Days per week: Not on file    Minutes per session: Not on file  . Stress: Not on file  Relationships  . Social Musician on phone: Not on file    Gets together: Not on file    Attends religious service: Not on file    Active member of club or organization: Not on file    Attends meetings of clubs or organizations: Not on file    Relationship status: Not on file  . Intimate partner violence    Fear of current or ex partner: Not on file    Emotionally abused: Not on file    Physically abused: Not on file    Forced sexual activity: Not on file  Other Topics Concern  . Not on file  Social History Narrative  . Not on file     Physical Exam  Vital Signs and Nursing  Notes reviewed Vitals:   05/01/19 1817  BP: (!) 156/89  Pulse: (!) 118  Resp: 18  Temp: 97.8 F (36.6 C)  SpO2: 97%    CONSTITUTIONAL: Well-appearing, NAD NEURO:  Alert and oriented x 3, no focal deficits EYES:  eyes equal and reactive ENT/NECK:  no LAD, no JVD CARDIO: Regular rate, well-perfused, normal S1 and S2 PULM:  CTAB no wheezing or rhonchi GI/GU:  normal bowel sounds, non-distended, non-tender MSK/SPINE:  No gross deformities, no edema SKIN:  no rash, atraumatic PSYCH:  Appropriate speech and behavior  Diagnostic and Interventional Summary    EKG Interpretation  Date/Time:    Ventricular Rate:    PR Interval:    QRS Duration:   QT Interval:    QTC Calculation:   R Axis:     Text Interpretation:        Labs Reviewed  COMPREHENSIVE METABOLIC PANEL - Abnormal; Notable for the following components:      Result Value   CO2 21 (*)    Glucose, Bld 101 (*)    ALT 46 (*)    All other components within normal limits  ACETAMINOPHEN  LEVEL - Abnormal; Notable for the following components:   Acetaminophen (Tylenol), Serum <10 (*)    All other components within normal limits  CBC - Abnormal; Notable for the following components:   WBC 16.5 (*)    All other components within normal limits  RAPID URINE DRUG SCREEN, HOSP PERFORMED - Abnormal; Notable for the following components:   Benzodiazepines POSITIVE (*)    Tetrahydrocannabinol POSITIVE (*)    All other components within normal limits  ETHANOL  SALICYLATE LEVEL  POC URINE PREG, ED    No orders to display    Medications - No data to display   Procedures Critical Care  ED Course and Medical Decision Making  I have reviewed the triage vital signs and the nursing notes.  Pertinent labs & imaging results that were available during my care of the patient were reviewed by me and considered in my medical decision making (see below for details).  Suicidal ideation without specific plan, increasing depression, will consult TTS to evaluate for inpatient versus outpatient management.  Labs are reassuring, patient is medically cleared, awaiting TTS recommendations.  Signed out to default provider.  Barth Kirks. Sedonia Small, [place] [email or web address]  Final Clinical Impressions(s) / ED Diagnoses     ICD-10-CM   1. Suicidal ideation  R45.851     ED Discharge Orders    None      Discharge Instructions Discussed with and Provided to Patient: Discharge Instructions   None       Maudie Flakes, MD 05/01/19 2119

## 2019-05-01 NOTE — ED Triage Notes (Signed)
Pt c/o of SI x 2 weeks no plan.  States increased frustration and anger

## 2019-05-01 NOTE — ED Notes (Signed)
Pt assisted to BR to collect urine sample & change out into hospital attire, pts belongings placed in bag & locked in locker. Pt then wanded by security & now resting in hallway bed w/sitter present.

## 2019-05-02 ENCOUNTER — Inpatient Hospital Stay (HOSPITAL_COMMUNITY)
Admission: AD | Admit: 2019-05-02 | Discharge: 2019-05-05 | DRG: 885 | Disposition: A | Discharge status: Home / Self Care | Payer: Medicaid Other | Source: Intra-hospital | Attending: Psychiatry | Admitting: Psychiatry

## 2019-05-02 ENCOUNTER — Encounter (HOSPITAL_COMMUNITY): Payer: Self-pay

## 2019-05-02 ENCOUNTER — Other Ambulatory Visit: Payer: Self-pay | Admitting: Family

## 2019-05-02 DIAGNOSIS — Z91018 Allergy to other foods: Secondary | ICD-10-CM | POA: Diagnosis not present

## 2019-05-02 DIAGNOSIS — F17210 Nicotine dependence, cigarettes, uncomplicated: Secondary | ICD-10-CM | POA: Diagnosis present

## 2019-05-02 DIAGNOSIS — G4700 Insomnia, unspecified: Secondary | ICD-10-CM | POA: Diagnosis present

## 2019-05-02 DIAGNOSIS — G43909 Migraine, unspecified, not intractable, without status migrainosus: Secondary | ICD-10-CM | POA: Diagnosis present

## 2019-05-02 DIAGNOSIS — Z885 Allergy status to narcotic agent status: Secondary | ICD-10-CM | POA: Diagnosis not present

## 2019-05-02 DIAGNOSIS — F332 Major depressive disorder, recurrent severe without psychotic features: Secondary | ICD-10-CM | POA: Diagnosis present

## 2019-05-02 DIAGNOSIS — F3175 Bipolar disorder, in partial remission, most recent episode depressed: Secondary | ICD-10-CM

## 2019-05-02 DIAGNOSIS — Z915 Personal history of self-harm: Secondary | ICD-10-CM | POA: Diagnosis not present

## 2019-05-02 DIAGNOSIS — M0820 Juvenile rheumatoid arthritis with systemic onset, unspecified site: Secondary | ICD-10-CM | POA: Diagnosis present

## 2019-05-02 DIAGNOSIS — K219 Gastro-esophageal reflux disease without esophagitis: Secondary | ICD-10-CM | POA: Diagnosis present

## 2019-05-02 DIAGNOSIS — F4310 Post-traumatic stress disorder, unspecified: Secondary | ICD-10-CM | POA: Diagnosis present

## 2019-05-02 LAB — SARS CORONAVIRUS 2 BY RT PCR (HOSPITAL ORDER, PERFORMED IN ~~LOC~~ HOSPITAL LAB): SARS Coronavirus 2: NEGATIVE

## 2019-05-02 LAB — PREGNANCY, URINE: Preg Test, Ur: NEGATIVE

## 2019-05-02 MED ORDER — LOPERAMIDE HCL 2 MG PO CAPS: 2.0000 mg | ORAL_CAPSULE | ORAL | Status: DC | PRN

## 2019-05-02 MED ORDER — DOXEPIN HCL 25 MG PO CAPS
25.0000 mg | ORAL_CAPSULE | Freq: Once | ORAL | Status: AC
  Administered 2019-05-03: 25 mg via ORAL
  Filled 2019-05-02 (×2): qty 1

## 2019-05-02 MED ORDER — TRAZODONE HCL 100 MG PO TABS: 100.0000 mg | ORAL_TABLET | Freq: Every evening | ORAL | Status: DC | PRN

## 2019-05-02 MED ORDER — PANTOPRAZOLE SODIUM 40 MG PO TBEC
40.0000 mg | DELAYED_RELEASE_TABLET | Freq: Every day | ORAL | Status: DC
  Administered 2019-05-03 – 2019-05-05 (×3): 40 mg via ORAL
  Filled 2019-05-02 (×6): qty 1

## 2019-05-02 MED ORDER — ARIPIPRAZOLE 5 MG PO TABS: 5.0000 mg | ORAL_TABLET | Freq: Every day | ORAL | Status: DC

## 2019-05-02 MED ORDER — TRAZODONE HCL 50 MG PO TABS: 50.0000 mg | ORAL_TABLET | Freq: Every evening | ORAL | Status: DC | PRN

## 2019-05-02 MED ORDER — ACETAMINOPHEN 325 MG PO TABS
650.0000 mg | ORAL_TABLET | Freq: Four times a day (QID) | ORAL | Status: DC | PRN
  Administered 2019-05-02 – 2019-05-05 (×5): 650 mg via ORAL
  Filled 2019-05-02 (×5): qty 2

## 2019-05-02 MED ORDER — LORAZEPAM 0.5 MG PO TABS
0.5000 mg | ORAL_TABLET | Freq: Four times a day (QID) | ORAL | Status: DC | PRN
  Administered 2019-05-02 – 2019-05-04 (×2): 0.5 mg via ORAL
  Filled 2019-05-02 (×2): qty 1

## 2019-05-02 MED ORDER — ALUM & MAG HYDROXIDE-SIMETH 200-200-20 MG/5ML PO SUSP: 30.0000 mL | ORAL | Status: DC | PRN

## 2019-05-02 MED ORDER — VITAMIN B-1 100 MG PO TABS
100.0000 mg | ORAL_TABLET | Freq: Every day | ORAL | Status: DC
  Administered 2019-05-03 – 2019-05-05 (×3): 100 mg via ORAL
  Filled 2019-05-02 (×4): qty 1

## 2019-05-02 MED ORDER — ARIPIPRAZOLE 5 MG PO TABS
5.0000 mg | ORAL_TABLET | Freq: Every day | ORAL | Status: DC
  Administered 2019-05-03 – 2019-05-05 (×3): 5 mg via ORAL
  Filled 2019-05-02 (×4): qty 1

## 2019-05-02 MED ORDER — TOPIRAMATE 25 MG PO TABS
25.0000 mg | ORAL_TABLET | Freq: Two times a day (BID) | ORAL | Status: DC
  Administered 2019-05-02 – 2019-05-03 (×2): 25 mg via ORAL
  Filled 2019-05-02 (×8): qty 1

## 2019-05-02 MED ORDER — THIAMINE HCL 100 MG/ML IJ SOLN: 100.0000 mg | Freq: Once | INTRAMUSCULAR | Status: DC

## 2019-05-02 MED ORDER — MAGNESIUM HYDROXIDE 400 MG/5ML PO SUSP: 30.0000 mL | Freq: Every day | ORAL | Status: DC | PRN

## 2019-05-02 MED ORDER — HYDROXYZINE HCL 25 MG PO TABS: 25.0000 mg | ORAL_TABLET | Freq: Four times a day (QID) | ORAL | Status: DC | PRN

## 2019-05-02 MED ORDER — PANTOPRAZOLE SODIUM 40 MG PO TBEC
40.0000 mg | DELAYED_RELEASE_TABLET | Freq: Every day | ORAL | Status: DC
  Administered 2019-05-02: 40 mg via ORAL
  Filled 2019-05-02: qty 1

## 2019-05-02 MED ORDER — ONDANSETRON 4 MG PO TBDP: 4.0000 mg | ORAL_TABLET | Freq: Four times a day (QID) | ORAL | Status: DC | PRN

## 2019-05-02 MED ORDER — LORAZEPAM 1 MG PO TABS: 1.0000 mg | ORAL_TABLET | Freq: Four times a day (QID) | ORAL | Status: DC | PRN

## 2019-05-02 MED ORDER — HYDROXYZINE HCL 25 MG PO TABS: 25.0000 mg | ORAL_TABLET | Freq: Three times a day (TID) | ORAL | Status: DC | PRN

## 2019-05-02 MED ORDER — VENLAFAXINE HCL ER 37.5 MG PO CP24
37.5000 mg | ORAL_CAPSULE | Freq: Every morning | ORAL | Status: DC
  Administered 2019-05-02: 37.5 mg via ORAL
  Filled 2019-05-02: qty 1

## 2019-05-02 MED ORDER — VENLAFAXINE HCL ER 37.5 MG PO CP24
37.5000 mg | ORAL_CAPSULE | Freq: Every day | ORAL | Status: DC
  Administered 2019-05-03: 37.5 mg via ORAL
  Filled 2019-05-02 (×2): qty 1

## 2019-05-02 MED ORDER — ADULT MULTIVITAMIN W/MINERALS CH
1.0000 | ORAL_TABLET | Freq: Every day | ORAL | Status: DC
  Administered 2019-05-02 – 2019-05-05 (×4): 1 via ORAL
  Filled 2019-05-02 (×7): qty 1

## 2019-05-02 MED ORDER — LORAZEPAM 1 MG PO TABS
1.0000 mg | ORAL_TABLET | Freq: Four times a day (QID) | ORAL | Status: DC | PRN
  Administered 2019-05-03 – 2019-05-04 (×3): 1 mg via ORAL
  Filled 2019-05-02 (×3): qty 1

## 2019-05-02 MED ORDER — ACETAMINOPHEN 500 MG PO TABS
1000.0000 mg | ORAL_TABLET | Freq: Three times a day (TID) | ORAL | Status: DC | PRN
  Administered 2019-05-02: 1000 mg via ORAL
  Filled 2019-05-02: qty 2

## 2019-05-02 NOTE — BHH Counselor (Signed)
TTS reassessment: Patient is alert and oriented x 4. She continues to endorse SI. Denies HI/AVH. Patient is unable to contract for safety and states that she does not have any natural supports. Per Dr. Dwyane Dee patient meets inpatient criteria.
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
## 2019-05-02 NOTE — Tx Team (Signed)
Initial Treatment Plan 05/02/2019 3:31 PM [name] [id number]    PATIENT STRESSORS: Financial difficulties Health problems Marital or family conflict Medication change or noncompliance Substance abuse   PATIENT STRENGTHS: Ability for insight Active sense of humor Average or above average intelligence Capable of independent living Communication skills Supportive family/friends   PATIENT IDENTIFIED PROBLEMS: depression  anxiety  Suicidal ideations                 DISCHARGE CRITERIA:  Ability to meet basic life and health needs Improved stabilization in mood, thinking, and/or behavior Medical problems require only outpatient monitoring Motivation to continue treatment in a less acute level of care  PRELIMINARY DISCHARGE PLAN: Attend aftercare/continuing care group Outpatient therapy Return to previous living arrangement  PATIENT/FAMILY INVOLVEMENT: This treatment plan has been presented to and reviewed with the patient, [name].  The patient and family have been given the opportunity to ask questions and make suggestions.  Baron Sane, RN 05/02/2019, 3:31 PM
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
## 2019-05-02 NOTE — BHH Counselor (Signed)
Patient accepted to [facility]2 after 2 pm pending a negative Covid test. Accepting MD is Dr. Dwyane Dee. Attending MD is Dr. Parke Poisson. Call report to [phone].
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
## 2019-05-02 NOTE — ED Notes (Signed)
Spoke with Lytle Michaels from [facility], pt meets inpatient criteria

## 2019-05-03 LAB — LIPID PANEL
Cholesterol: 200 mg/dL (ref 0–200)
HDL: 41 mg/dL (ref >40)
LDL Cholesterol: 133 mg/dL — ABNORMAL HIGH (ref 0–99)
Total CHOL/HDL Ratio: 4.9 RATIO
Triglycerides: 128 mg/dL (ref <150)
VLDL: 26 mg/dL (ref 0–40)

## 2019-05-03 LAB — HEMOGLOBIN A1C
Hgb A1c MFr Bld: 5.1 % (ref 4.8–5.6)
Mean Plasma Glucose: 99.67 mg/dL

## 2019-05-03 LAB — TSH: TSH: 3.408 u[IU]/mL (ref 0.350–4.500)

## 2019-05-03 MED ORDER — TRAZODONE HCL 50 MG PO TABS
50.0000 mg | ORAL_TABLET | Freq: Every evening | ORAL | Status: DC | PRN
  Administered 2019-05-03 – 2019-05-04 (×2): 50 mg via ORAL
  Filled 2019-05-03 (×6): qty 1

## 2019-05-03 MED ORDER — VENLAFAXINE HCL ER 75 MG PO CP24
75.0000 mg | ORAL_CAPSULE | Freq: Every day | ORAL | Status: DC
  Administered 2019-05-04 – 2019-05-05 (×2): 75 mg via ORAL
  Filled 2019-05-03 (×3): qty 1

## 2019-05-03 MED ORDER — CLONIDINE HCL 0.1 MG PO TABS
0.1000 mg | ORAL_TABLET | Freq: Three times a day (TID) | ORAL | Status: DC | PRN
  Administered 2019-05-03: 0.1 mg via ORAL
  Filled 2019-05-03 (×2): qty 1

## 2019-05-03 MED ORDER — TOPIRAMATE 25 MG PO TABS
50.0000 mg | ORAL_TABLET | Freq: Two times a day (BID) | ORAL | Status: DC
  Administered 2019-05-03 – 2019-05-05 (×4): 50 mg via ORAL
  Filled 2019-05-03 (×6): qty 2
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
## 2019-05-03 NOTE — Progress Notes (Signed)
[facility] Group Notes:  (Nursing/MHT/Case Management/Adjunct)  Date:  05/03/2019  Time:  1400  Type of Therapy:  Nurse Education  Participation Level:  Did Not Attend   

## 2019-05-03 NOTE — Progress Notes (Addendum)
[facility] MD Progress Note  05/03/2019 11:00 AM [name]  MRN:  [id number] Subjective:  [name] reported " I am feeling a lot better today."   Evaluation: [name] observed resting in bed. Patient is denying suicidal or homicidal ideations. Patient denied history of plan or intent in the past.  stated " I am feeling a lot better than I have in a while. Patient reported she is followed by Psychiatrist at [facility] and is currently seeking a therapist for outpatient follow-up. Denies auditory or visual hallucinations.  Reports resting better with medication.  Patient continues to isolate to her room during the day.  Patient reports depression 6 out of 10 with10 being the worst.  Discussed titration with Effexor. [name] was receptive to plan.  Reported she does not feel like attending groups today patient. Reported taken medications as directed. Support, encouragement and reassurance was provided.     Principal Problem: MDD (major depressive disorder), recurrent episode, severe (HCC) Diagnosis: Principal Problem:   MDD (major depressive disorder), recurrent episode, severe (HCC)  Total Time spent with patient: 15 minutes  Past Psychiatric History:   Past Medical History:  Past Medical History:  Diagnosis Date  . Pericarditis   . RA (rheumatoid arthritis) (HCC)   . Still disease, 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HCC)   . 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HCC)   . 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HCC)   . Still's disease [facility])     Past Surgical History:  Procedure Laterality Date  . WISDOM TOOTH EXTRACTION     Family History: History reviewed. No pertinent family history. Family Psychiatric  History:  Social History:  Social History   Substance and Sexual Activity  Alcohol Use Yes  . Alcohol/week: 3.0 - 4.0 standard drinks  . Types: 3 - 4 Shots of liquor per week   Comment: beer/liquor... every night x 1 month      Social History   Substance and Sexual Activity  Drug Use Yes  . Types: Marijuana    Social History    Socioeconomic History  . Marital status: Single    Spouse name: Not on file  . Number of children: Not on file  . Years of education: Not on file  . Highest education level: Not on file  Occupational History  . Not on file  Social Needs  . Financial resource strain: Not on file  . Food insecurity    Worry: Not on file    Inability: Not on file  . Transportation needs    Medical: Not on file    Non-medical: Not on file  Tobacco Use  . Smoking status: Current Every Day Smoker    Packs/day: 1.00    Years: 5.00    Pack years: 5.00    Types: Cigarettes    Start date: 07/27/2009  . Smokeless tobacco: Never Used  . Tobacco comment: patient smoked her last cigarette 3 days ago 05/05/14 due to having a tooth pulled  Substance and Sexual Activity  . Alcohol use: Yes    Alcohol/week: 3.0 - 4.0 standard drinks    Types: 3 - 4 Shots of liquor per week    Comment: beer/liquor... every night x 1 month   . Drug use: Yes    Types: Marijuana  . Sexual activity: Yes    Birth control/protection: None  Lifestyle  . Physical activity    Days per week: Not on file    Minutes per session: Not on file  . Stress: Not on file  Relationships  . Social connections  Talks on phone: Not on file    Gets together: Not on file    Attends religious service: Not on file    Active member of club or organization: Not on file    Attends meetings of clubs or organizations: Not on file    Relationship status: Not on file  Other Topics Concern  . Not on file  Social History Narrative  . Not on file   Additional Social History:                         Sleep: Fair  Appetite:  Fair  Current Medications: Current Facility-Administered Medications  Medication Dose Route Frequency Provider Last Rate Last Dose  . acetaminophen (TYLENOL) tablet 650 mg  650 mg Oral Q6H PRN Maryagnes Amos, FNP   650 mg at 05/02/19 2140  . ARIPiprazole (ABILIFY) tablet 5 mg  5 mg Oral Daily Kamarii Carton,  Rockey Situ, MD   5 mg at 05/03/19 0907  . LORazepam (ATIVAN) tablet 0.5 mg  0.5 mg Oral Q6H PRN Yovana Scogin, Rockey Situ, MD   0.5 mg at 05/02/19 2140  . LORazepam (ATIVAN) tablet 1 mg  1 mg Oral Q6H PRN Matin Mattioli, Rockey Situ, MD      . multivitamin with minerals tablet 1 tablet  1 tablet Oral Daily Khup Sapia, Rockey Situ, MD   1 tablet at 05/03/19 0907  . pantoprazole (PROTONIX) EC tablet 40 mg  40 mg Oral Daily Gaddiel Cullens A, MD      . thiamine (VITAMIN B-1) tablet 100 mg  100 mg Oral Daily Lotus Santillo, Rockey Situ, MD   100 mg at 05/03/19 0906  . topiramate (TOPAMAX) tablet 25 mg  25 mg Oral BID Lusia Greis, Rockey Situ, MD   25 mg at 05/03/19 0907  . traZODone (DESYREL) tablet 50 mg  50 mg Oral QHS,MR X 1 Oneta Rack, NP      . Melene Muller ON 05/04/2019] venlafaxine XR (EFFEXOR-XR) 24 hr capsule 75 mg  75 mg Oral Q breakfast Oneta Rack, NP        Lab Results:  Results for orders placed or performed during the hospital encounter of 05/02/19 (from the past 48 hour(s))  Hemoglobin A1c     Status: None   Collection Time: 05/03/19  6:54 AM  Result Value Ref Range   Hgb A1c MFr Bld 5.1 4.8 - 5.6 %    Comment: (NOTE) Pre diabetes:          5.7%-6.4% Diabetes:              >6.4% Glycemic control for   <7.0% adults with diabetes    Mean Plasma Glucose 99.67 mg/dL    Comment: Performed at [facility], [address], [place], [place] [address]  Lipid panel     Status: Abnormal   Collection Time: 05/03/19  6:54 AM  Result Value Ref Range   Cholesterol 200 0 - 200 mg/dL   Triglycerides 671 <245 mg/dL   HDL 41 >80 mg/dL   Total CHOL/HDL Ratio 4.9 RATIO   VLDL 26 0 - 40 mg/dL   LDL Cholesterol 998 (H) 0 - 99 mg/dL    Comment:        Total Cholesterol/HDL:CHD Risk Coronary Heart Disease Risk Table                     Men   Women  1/2 Average Risk   3.4   3.3  Average Risk       5.0   4.4  2 X Average Risk   9.6   7.1  3 X Average Risk  23.4   11.0        Use the calculated Patient Ratio above and  the CHD Risk Table to determine the patient's CHD Risk.        ATP III CLASSIFICATION (LDL):  <100     mg/dL   Optimal  [id number]  mg/dL   Near or Above                    Optimal  130-159  mg/dL   Borderline  [id number]  mg/dL   High  >284>190     mg/dL   Very High Performed at [facility], [address] [place], [place] [address]   TSH     Status: None   Collection Time: 05/03/19  6:54 AM  Result Value Ref Range   TSH 3.408 0.350 - 4.500 uIU/mL    Comment: Performed by a 3rd Generation assay with a functional sensitivity of <=0.01 uIU/mL. Performed at [facility], [address] [place], [place] [address]     Blood Alcohol level:  Lab Results  Component Value Date   ETH <10 05/01/2019    Metabolic Disorder Labs: Lab Results  Component Value Date   HGBA1C 5.1 05/03/2019   MPG 99.67 05/03/2019   No results found for: PROLACTIN Lab Results  Component Value Date   CHOL 200 05/03/2019   TRIG 128 05/03/2019   HDL 41 05/03/2019   CHOLHDL 4.9 05/03/2019   VLDL 26 05/03/2019   LDLCALC 133 (H) 05/03/2019    Physical Findings: AIMS: Facial and Oral Movements Muscles of Facial Expression: None, normal Lips and Perioral Area: None, normal Jaw: None, normal Tongue: None, normal,Extremity Movements Upper (arms, wrists, hands, fingers): None, normal Lower (legs, knees, ankles, toes): None, normal, Trunk Movements Neck, shoulders, hips: None, normal, Overall Severity Severity of abnormal movements (highest score from questions above): None, normal Incapacitation due to abnormal movements: None, normal Patient's awareness of abnormal movements (rate only patient's report): No Awareness, Dental Status Current problems with teeth and/or dentures?: No Does patient usually wear dentures?: No  CIWA:    COWS:     Musculoskeletal: Strength & Muscle Tone: within normal limits Gait & Station: normal Patient leans: N/A  Psychiatric Specialty  Exam: Physical Exam  Vitals reviewed. Constitutional: She is oriented to person, place, and time. She appears well-developed.  Cardiovascular: Normal rate.  Neurological: She is alert and oriented to person, place, and time.  Psychiatric: She has a normal mood and affect. Her behavior is normal.    Review of Systems  Psychiatric/Behavioral: Positive for depression. Negative for suicidal ideas. The patient is nervous/anxious.   All other systems reviewed and are negative.   Blood pressure 137/84, pulse 95, temperature 98.2 F (36.8 C), temperature source Oral, resp. rate 16, height 5\' 3"  (1.6 m), weight 121.6 kg, SpO2 98 %.Body mass index is 47.47 kg/m.  General Appearance: Guarded  Eye Contact:  Fair  Speech:  Clear and Coherent  Volume:  Normal  Mood:  Anxious and Depressed  Affect:  Congruent  Thought Process:  Coherent  Orientation:  Full (Time, Place, and Person)  Thought Content:  Logical  Suicidal Thoughts:  No  Homicidal Thoughts:  No  Memory:  Immediate;   Fair Remote;   Fair  Judgement:  Fair  Insight:  Fair  Psychomotor Activity:  Normal  Concentration:  Concentration: Fair  Recall:  [employer] of Knowledge:  Fair  Language:  Fair  Akathisia:  No  Handed:  Right  AIMS (if indicated):     Assets:  Communication Skills Desire for Improvement Physical Health Social Support  ADL's:  Intact  Cognition:  WNL  Sleep:  Number of Hours: 4.25     Treatment Plan Summary: Daily contact with patient to assess and evaluate symptoms and progress in treatment and Medication management   Continue with current treatment plan on 05/03/2019 as listed below except wherenoted  Major Depression:  Continue Abilify 5 mg p.o. daily Increased Effexor 37.5 to 75 mg p.o. daily Increased Topamax 25 mg to 50mg   p.o. twice daily Initiated trazodone 50 mgp.o. PRN nightly  CSW to continue working on discharge disposition Patient encouraged to participate within the therapeutic  milieu  Derrill Center, NP 05/03/2019, 11:00 AM  Attest to NP progress note

## 2019-05-03 NOTE — Progress Notes (Signed)
Patient's BP (dinamap) 148/114 HR 126 - Rechecked manually: 145/100 HR 100 Pt continues to report mild headache/ some blurry vision- reports that it's probably her glasses  Other than mild headache pt states, "I feel fine" NP notifed

## 2019-05-03 NOTE — Progress Notes (Signed)
Patient reports she slept most of the day and will probably have trouble sleeping tonight. She spent some brief time in the dayroom. Writer received a 1x dose of doxepin. Will monitor effectiveness of medication. Safety maintained with 15 min checks.

## 2019-05-03 NOTE — [facility] Group Notes (Signed)
LCSW Group Therapy Note  05/03/2019    10:00-11:00am   Type of Therapy and Topic:  Group Therapy: Shame and Its Impact   Participation Level:  Did Not Attend   Description of Group:   In this group, patients shared and discussed that guilt is the negative feeling we have when we've done something wrong, while shame is the negative feeling we have simply about "being."  In listening to each other share, patients learned that humans are all imperfect and that there is no shame in this.  We discussed how it could positively impact our wellbeing by accepting our faults as part of our being that can be worked on but does not have to shame us.    Therapeutic Goals: 1. Patients will learn the difference between guilt and shame. 2. Patients will share their current shame feelings and how this has impacted their current lives. 3. Patients will explore possible ways to think differently about those parts of their bodies, feelings, and lives about which they do have shame. 4. Patients will learn that shame is universal, and that keeping our shame a secret actually increases its hold on us.  Summary of Patient Progress:  N/A  Therapeutic Modalities:   Cognitive Behavioral Therapy Motivation Interviewing  Chrisanna Mishra J Grossman-Orr  .  

## 2019-05-03 NOTE — Progress Notes (Signed)
  D. Pt remained in bed for most of the morning- friendly upon approach- reports feeling better today- pt  currently denies SI/HI and AVH A. Labs and vitals monitored. Pt compliant with medications. Pt supported emotionally and encouraged to express concerns and ask questions.   R. Pt remains safe with 15 minute checks. Will continue POC.

## 2019-05-03 NOTE — Progress Notes (Signed)
[facility] NOVEL CORONAVIRUS (COVID-19) DAILY CHECK-OFF SYMPTOMS - answer yes or no to each - every day NO YES  Have you had a fever in the past 24 hours?  . Fever (Temp > 37.80C / 100F) X   Have you had any of these symptoms in the past 24 hours? . New Cough .  Sore Throat  .  Shortness of Breath .  Difficulty Breathing .  Unexplained Body Aches   X   Have you had any one of these symptoms in the past 24 hours not related to allergies?   . Runny Nose .  Nasal Congestion .  Sneezing   X   If you have had runny nose, nasal congestion, sneezing in the past 24 hours, has it worsened?  X   EXPOSURES - check yes or no X   Have you traveled outside the state in the past 14 days?  X   Have you been in contact with someone with a confirmed diagnosis of COVID-19 or PUI in the past 14 days without wearing appropriate PPE?  X   Have you been living in the same home as a person with confirmed diagnosis of COVID-19 or a PUI (household contact)?    X   Have you been diagnosed with COVID-19?    X              What to do next: Answered NO to all: Answered YES to anything:   Proceed with unit schedule Follow the [facility] Inpatient Flowsheet.   

## 2019-05-04 NOTE — Progress Notes (Signed)
D. Pt reports feeling better today-B/P WNL today- pt friendly during interactions- observed in the milieu interacting appropriately with peers and staff. Pt currently denies SI/HI and AVH  A. Labs and vitals monitored. Pt compliant with medications. Pt supported emotionally and encouraged to express concerns and ask questions.   R. Pt remains safe with 15 minute checks. Will continue POC.

## 2019-05-04 NOTE — Progress Notes (Signed)
[facility] NOVEL CORONAVIRUS (COVID-19) DAILY CHECK-OFF SYMPTOMS - answer yes or no to each - every day NO YES  Have you had a fever in the past 24 hours?  . Fever (Temp > 37.80C / 100F) X   Have you had any of these symptoms in the past 24 hours? . New Cough .  Sore Throat  .  Shortness of Breath .  Difficulty Breathing .  Unexplained Body Aches   X   Have you had any one of these symptoms in the past 24 hours not related to allergies?   . Runny Nose .  Nasal Congestion .  Sneezing   X   If you have had runny nose, nasal congestion, sneezing in the past 24 hours, has it worsened?  X   EXPOSURES - check yes or no X   Have you traveled outside the state in the past 14 days?  X   Have you been in contact with someone with a confirmed diagnosis of COVID-19 or PUI in the past 14 days without wearing appropriate PPE?  X   Have you been living in the same home as a person with confirmed diagnosis of COVID-19 or a PUI (household contact)?    X   Have you been diagnosed with COVID-19?    X              What to do next: Answered NO to all: Answered YES to anything:   Proceed with unit schedule Follow the [facility] Inpatient Flowsheet.   

## 2019-05-04 NOTE — [facility] Group Notes (Signed)
[facility] Group Notes: (Clinical Social Work)   05/04/2019      Type of Therapy:  Group Therapy   Participation Level:  Did Not Attend - was invited both individually by MHT and by overhead announcement, chose not to attend.   Selmer Dominion, LCSW 05/04/2019, 3:14 PM

## 2019-05-04 NOTE — Progress Notes (Addendum)
[address] [facility] MD Progress Note  05/04/2019 10:27 AM [name]  MRN:  [id number]   Subjective:  [name] stated " I am feeling a lot better today."  Evaluation: [name] observed sitting in bed.  Continues to report her mood has improved since her admission.  Denies thoughts of suicidal ideations.  Denies auditory or visual hallucinations.  Reports overall her mood has improved since her admission.  Patient reports feeling better and feels ready for discharge soon.  Reports taking and tolerating her medication well.  Patient is more visible on the unit during the evening.  Titration to Effexor and Topamax reported taking and tolerating well.  We will continue to monitor for medication symptoms.  Reports a good appetite.  States he is resting well throughout the night.  We will continue to monitor symptoms.  Chart reviewed continue to monitor blood pressure.  Patient reported history of chronic migraines.  Consider discharge to primary care provider on 05/05/2019.  Support encouragement reassurance was provided.    Principal Problem: MDD (major depressive disorder), recurrent episode, severe (HCC) Diagnosis: Principal Problem:   MDD (major depressive disorder), recurrent episode, severe (HCC)  Total Time spent with patient: 15 minutes  Past Psychiatric History:   Past Medical History:  Past Medical History:  Diagnosis Date  . Pericarditis   . RA (rheumatoid arthritis) (HCC)   . Still disease, 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HCC)   . 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HCC)   . 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HCC)   . Still's disease [facility])     Past Surgical History:  Procedure Laterality Date  . WISDOM TOOTH EXTRACTION     Family History: History reviewed. No pertinent family history. Family Psychiatric  History:  Social History:  Social History   Substance and Sexual Activity  Alcohol Use Yes  . Alcohol/week: 3.0 - 4.0 standard drinks  . Types: 3 - 4 Shots of liquor per week   Comment: beer/liquor... every night x 1 month       Social History   Substance and Sexual Activity  Drug Use Yes  . Types: Marijuana    Social History   Socioeconomic History  . Marital status: Single    Spouse name: Not on file  . Number of children: Not on file  . Years of education: Not on file  . Highest education level: Not on file  Occupational History  . Not on file  Social Needs  . Financial resource strain: Not on file  . Food insecurity    Worry: Not on file    Inability: Not on file  . Transportation needs    Medical: Not on file    Non-medical: Not on file  Tobacco Use  . Smoking status: Current Every Day Smoker    Packs/day: 1.00    Years: 5.00    Pack years: 5.00    Types: Cigarettes    Start date: 07/27/2009  . Smokeless tobacco: Never Used  . Tobacco comment: patient smoked her last cigarette 3 days ago 05/05/14 due to having a tooth pulled  Substance and Sexual Activity  . Alcohol use: Yes    Alcohol/week: 3.0 - 4.0 standard drinks    Types: 3 - 4 Shots of liquor per week    Comment: beer/liquor... every night x 1 month   . Drug use: Yes    Types: Marijuana  . Sexual activity: Yes    Birth control/protection: None  Lifestyle  . Physical activity    Days per week: Not on file    Minutes  per session: Not on file  . Stress: Not on file  Relationships  . Social Musicianconnections    Talks on phone: Not on file    Gets together: Not on file    Attends religious service: Not on file    Active member of club or organization: Not on file    Attends meetings of clubs or organizations: Not on file    Relationship status: Not on file  Other Topics Concern  . Not on file  Social History Narrative  . Not on file   Additional Social History:                         Sleep: Fair  Appetite:  Fair  Current Medications: Current Facility-Administered Medications  Medication Dose Route Frequency Provider Last Rate Last Dose  . acetaminophen (TYLENOL) tablet 650 mg  650 mg Oral Q6H PRN  Maryagnes AmosStarkes-Perry, Takia S, FNP   650 mg at 05/04/19 [id number]  . ARIPiprazole (ABILIFY) tablet 5 mg  5 mg Oral Daily Conrad Zajkowski, Rockey SituFernando A, MD   5 mg at 05/04/19 0839  . cloNIDine (CATAPRES) tablet 0.1 mg  0.1 mg Oral TID PRN Oneta RackLewis, Tanika N, NP   0.1 mg at 05/03/19 1829  . LORazepam (ATIVAN) tablet 0.5 mg  0.5 mg Oral Q6H PRN Kainoa Swoboda, Rockey SituFernando A, MD   0.5 mg at 05/02/19 2140  . LORazepam (ATIVAN) tablet 1 mg  1 mg Oral Q6H PRN Adonus Uselman, Rockey SituFernando A, MD   1 mg at 05/03/19 2121  . multivitamin with minerals tablet 1 tablet  1 tablet Oral Daily Pearce Littlefield, Rockey SituFernando A, MD   1 tablet at 05/04/19 0839  . pantoprazole (PROTONIX) EC tablet 40 mg  40 mg Oral Daily Eldon Zietlow, Rockey SituFernando A, MD   40 mg at 05/04/19 0839  . thiamine (VITAMIN B-1) tablet 100 mg  100 mg Oral Daily Yesenia Locurto, Rockey SituFernando A, MD   100 mg at 05/04/19 0841  . topiramate (TOPAMAX) tablet 50 mg  50 mg Oral BID Oneta RackLewis, Tanika N, NP   50 mg at 05/04/19 0839  . traZODone (DESYREL) tablet 50 mg  50 mg Oral QHS,MR X 1 Oneta RackLewis, Tanika N, NP   50 mg at 05/03/19 2121  . venlafaxine XR (EFFEXOR-XR) 24 hr capsule 75 mg  75 mg Oral Q breakfast Oneta RackLewis, Tanika N, NP   75 mg at 05/04/19 [id number]    Lab Results:  Results for orders placed or performed during the hospital encounter of 05/02/19 (from the past 48 hour(s))  Hemoglobin A1c     Status: None   Collection Time: 05/03/19  6:54 AM  Result Value Ref Range   Hgb A1c MFr Bld 5.1 4.8 - 5.6 %    Comment: (NOTE) Pre diabetes:          5.7%-6.4% Diabetes:              >6.4% Glycemic control for   <7.0% adults with diabetes    Mean Plasma Glucose 99.67 mg/dL    Comment: Performed at [facility], [address] [place], [place] [address]  Lipid panel     Status: Abnormal   Collection Time: 05/03/19  6:54 AM  Result Value Ref Range   Cholesterol 200 0 - 200 mg/dL   Triglycerides 119128 <147<150 mg/dL   HDL 41 >82>40 mg/dL   Total CHOL/HDL Ratio 4.9 RATIO   VLDL 26 0 - 40 mg/dL   LDL Cholesterol 956133 (H) 0 - 99 mg/dL  Comment:         Total Cholesterol/HDL:CHD Risk Coronary Heart Disease Risk Table                     Men   Women  1/2 Average Risk   3.4   3.3  Average Risk       5.0   4.4  2 X Average Risk   9.6   7.1  3 X Average Risk  23.4   11.0        Use the calculated Patient Ratio above and the CHD Risk Table to determine the patient's CHD Risk.        ATP III CLASSIFICATION (LDL):  <100     mg/dL   Optimal  100-129  mg/dL   Near or Above                    Optimal  130-159  mg/dL   Borderline  160-189  mg/dL   High  >190     mg/dL   Very High Performed at [facility] [address], [place], [place] [address]   TSH     Status: None   Collection Time: 05/03/19  6:54 AM  Result Value Ref Range   TSH 3.408 0.350 - 4.500 uIU/mL    Comment: Performed by a 3rd Generation assay with a functional sensitivity of <=0.01 uIU/mL. Performed at [facility], [place] [address], [place], [place] [address]     Blood Alcohol level:  Lab Results  Component Value Date   ETH <10 63/87/5643    Metabolic Disorder Labs: Lab Results  Component Value Date   HGBA1C 5.1 05/03/2019   MPG 99.67 05/03/2019   No results found for: PROLACTIN Lab Results  Component Value Date   CHOL 200 05/03/2019   TRIG 128 05/03/2019   HDL 41 05/03/2019   CHOLHDL 4.9 05/03/2019   VLDL 26 05/03/2019   LDLCALC 133 (H) 05/03/2019    Physical Findings: AIMS: Facial and Oral Movements Muscles of Facial Expression: None, normal Lips and Perioral Area: None, normal Jaw: None, normal Tongue: None, normal,Extremity Movements Upper (arms, wrists, hands, fingers): None, normal Lower (legs, knees, ankles, toes): None, normal, Trunk Movements Neck, shoulders, hips: None, normal, Overall Severity Severity of abnormal movements (highest score from questions above): None, normal Incapacitation due to abnormal movements: None, normal Patient's awareness of abnormal movements (rate only patient's  report): No Awareness, Dental Status Current problems with teeth and/or dentures?: No Does patient usually wear dentures?: No  CIWA:  CIWA-Ar Total: 0 COWS:     Musculoskeletal: Strength & Muscle Tone: within normal limits Gait & Station: normal Patient leans: N/A  Psychiatric Specialty Exam: Physical Exam  Vitals reviewed. Constitutional: She is oriented to person, place, and time. She appears well-developed.  Cardiovascular: Normal rate.  Neurological: She is alert and oriented to person, place, and time.  Psychiatric: She has a normal mood and affect. Her behavior is normal.    Review of Systems  Psychiatric/Behavioral: Positive for depression. Negative for suicidal ideas. The patient is nervous/anxious.   All other systems reviewed and are negative.   Blood pressure 117/72, pulse (!) 109, temperature 98.1 F (36.7 C), temperature source Oral, resp. rate 16, height 5\' 3"  (1.6 m), weight 121.6 kg, SpO2 98 %.Body mass index is 47.47 kg/m.  General Appearance: Casual  Eye Contact:  Fair  Speech:  Clear and Coherent  Volume:  Normal  Mood:  Anxious and Depressed  Affect:  Congruent  Thought Process:  Coherent  Orientation:  Full (Time, Place, and Person)  Thought Content:  Logical  Suicidal Thoughts:  No  Homicidal Thoughts:  No  Memory:  Immediate;   Fair Remote;   Fair  Judgement:  Fair  Insight:  Fair  Psychomotor Activity:  Normal  Concentration:  Concentration: Fair  Recall:  [employer] of Knowledge:  Fair  Language:  Fair  Akathisia:  No  Handed:  Right  AIMS (if indicated):     Assets:  Communication Skills Desire for Improvement Physical Health Social Support  ADL's:  Intact  Cognition:  WNL  Sleep:  Number of Hours: 6.75     Treatment Plan Summary: Daily contact with patient to assess and evaluate symptoms and progress in treatment and Medication management   Continue with current treatment plan on 05/04/2019 as listed below except  wherenoted  Major Depression:  Continue Abilify 5 mg p.o. daily Continue Effexor  75 mg p.o. daily Continue Topamax  50mg   p.o. twice daily Continue trazodone 50 mg p.o. PRN nightly  CSW to continue working on discharge disposition Patient encouraged to participate within the therapeutic milieu  , NP 05/04/2019, 10:27 AM   Attest to NP progress note

## 2019-05-05 DIAGNOSIS — F3175 Bipolar disorder, in partial remission, most recent episode depressed: Secondary | ICD-10-CM

## 2019-05-05 MED ORDER — VENLAFAXINE HCL ER 75 MG PO CP24: 75.0000 mg | ORAL_CAPSULE | Freq: Every day | ORAL | 0 refills | Status: DC

## 2019-05-05 MED ORDER — ADULT MULTIVITAMIN W/MINERALS CH: 1.0000 | ORAL_TABLET | Freq: Every day | ORAL | Status: DC

## 2019-05-05 MED ORDER — ARIPIPRAZOLE 5 MG PO TABS: 5.0000 mg | ORAL_TABLET | Freq: Every day | ORAL | 0 refills | Status: DC

## 2019-05-05 MED ORDER — PANTOPRAZOLE SODIUM 40 MG PO TBEC: 40.0000 mg | DELAYED_RELEASE_TABLET | Freq: Every day | ORAL | 0 refills | Status: DC

## 2019-05-05 MED ORDER — TOPIRAMATE 50 MG PO TABS: 50.0000 mg | ORAL_TABLET | Freq: Two times a day (BID) | ORAL | 0 refills | Status: DC

## 2019-05-05 MED ORDER — TRAZODONE HCL 50 MG PO TABS: 50.0000 mg | ORAL_TABLET | Freq: Every evening | ORAL | 0 refills | Status: DC | PRN

## 2019-05-05 NOTE — Discharge Summary (Addendum)
Physician Discharge Summary Note  Patient:  [name] is an 28 y.o., female MRN:  [id number] DOB:  [birth year] Patient phone:  [phone] (home)  Patient address:   [address] [place] [address],  Total Time spent with patient: 15 minutes  Date of Admission:  05/02/2019 Date of Discharge: 05/05/19  Reason for Admission:  suicidal ideation  Principal Problem: MDD (major depressive disorder), recurrent episode, severe (HCC) Discharge Diagnoses: Principal Problem:   MDD (major depressive disorder), recurrent episode, severe (HCC) Active Problems:   Bipolar I disorder, current or most recent episode depressed, in partial remission (HCC)   Past Psychiatric History: no prior psychiatric admissions . History of self cutting as a teenager but not since then. Denies history of violence. Denies history of psychosis. Denies history of Eating Disorder. Endorses some PTSD symptoms , as above . Reports suicidal attempt by overdosing after her mother died when patient was [age]. Did not seek treatment at that time. She reports history of depression and also describes brief episodes of increased energy and decreased need for sleep. She reports she has been diagnosed with Bipolar Disorder in the past .  Past Medical History:  Past Medical History:  Diagnosis Date  . Pericarditis   . RA (rheumatoid arthritis) (HCC)   . Still disease, 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HCC)   . 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HCC)   . 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HCC)   . Still's disease [facility](HCC)     Past Surgical History:  Procedure Laterality Date  . WISDOM TOOTH EXTRACTION     Family History: History reviewed. No pertinent family history. Family Psychiatric  History: paternal uncle has history of alcohol dependence, brother has history of substance dependence,no history of suicides in family. Social History:  Social History   Substance and Sexual Activity  Alcohol Use Yes  . Alcohol/week: 3.0 - 4.0 standard drinks  . Types: 3 - 4 Shots of liquor per  week   Comment: beer/liquor... every night x 1 month      Social History   Substance and Sexual Activity  Drug Use Yes  . Types: Marijuana    Social History   Socioeconomic History  . Marital status: Single    Spouse name: Not on file  . Number of children: Not on file  . Years of education: Not on file  . Highest education level: Not on file  Occupational History  . Not on file  Social Needs  . Financial resource strain: Not on file  . Food insecurity    Worry: Not on file    Inability: Not on file  . Transportation needs    Medical: Not on file    Non-medical: Not on file  Tobacco Use  . Smoking status: Current Every Day Smoker    Packs/day: 1.00    Years: 5.00    Pack years: 5.00    Types: Cigarettes    Start date: 07/27/2009  . Smokeless tobacco: Never Used  . Tobacco comment: patient smoked her last cigarette 3 days ago 05/05/14 due to having a tooth pulled  Substance and Sexual Activity  . Alcohol use: Yes    Alcohol/week: 3.0 - 4.0 standard drinks    Types: 3 - 4 Shots of liquor per week    Comment: beer/liquor... every night x 1 month   . Drug use: Yes    Types: Marijuana  . Sexual activity: Yes    Birth control/protection: None  Lifestyle  . Physical activity    Days per week: Not on  file    Minutes per session: Not on file  . Stress: Not on file  Relationships  . Social Musician on phone: Not on file    Gets together: Not on file    Attends religious service: Not on file    Active member of club or organization: Not on file    Attends meetings of clubs or organizations: Not on file    Relationship status: Not on file  Other Topics Concern  . Not on file  Social History Narrative  . Not on file    Hospital Course:  From admission H&P: Patient is a 28 year old female, who presented voluntarily to [facility] ED on 10/1. Reports she has been feeling depressed over the last several weeks to months. Attributes in part to significant  recent  stressors/losses ( relocated to [facility] from [facility] earlier this year due to strained relationship with roommates, death of a good friend in 02-24-2023, and more recently her best friend's father died in an accident, relationship with a married person which has been stressful). She also states that yesterday was anniversary of her mother's death, who died in 11/25/2007. In addition, she reports she had been on Cymbalta ,which was stopped ( did not wean off) about 3 weeks ago, after which she was off antidepressants until earlier this week when she started Effexor XR . Does not endorse any clear symptoms of SNRI withdrawal, but states she feels her mood deteriorated after stopping Duloxetine. Endorses neuro-vegetative symptoms of depression as below. Denies psychotic symptoms. Endorses some recent suicidal ideations, described as fleeting, intermittent, and mainly passive, although has had thoughts of driving her car off road. Reports that over the last few weeks has been drinking alcohol regularly, whereas before rarely drank. Reports has been drinking about 3 shots of liquor on most days. Admission UDS positive for cannabis and BZDs, admission BAL negative.  [name] was admitted for depression with passive suicidal ideation. She was started on Effexor and Abilify. Topamax was continued for migraines. CIWA protocol was started with Ativan PRN CIWA>10 for alcohol/BZD withdrawal. She participated in group therapy on the unit. She responded well to treatment with no adverse effects reported. She has shown improved mood, affect, sleep, and interaction. She denies any SI/HI/AVH and contracts for safety. She denies withdrawal symptoms. She is discharging on the medications listed below. She agrees to follow up at [facility] (see below). She is provided with prescriptions for medications upon discharge. Her stepmother is picking her up for discharge home.  Physical Findings: AIMS: Facial and Oral Movements Muscles of Facial  Expression: None, normal Lips and Perioral Area: None, normal Jaw: None, normal Tongue: None, normal,Extremity Movements Upper (arms, wrists, hands, fingers): None, normal Lower (legs, knees, ankles, toes): None, normal, Trunk Movements Neck, shoulders, hips: None, normal, Overall Severity Severity of abnormal movements (highest score from questions above): None, normal Incapacitation due to abnormal movements: None, normal Patient's awareness of abnormal movements (rate only patient's report): No Awareness, Dental Status Current problems with teeth and/or dentures?: No Does patient usually wear dentures?: No  CIWA:  CIWA-Ar Total: 0 COWS:     Musculoskeletal: Strength & Muscle Tone: within normal limits Gait & Station: normal Patient leans: N/A  Psychiatric Specialty Exam: Physical Exam  Nursing note and vitals reviewed. Constitutional: She is oriented to person, place, and time. She appears well-developed and well-nourished.  Cardiovascular: Normal rate.  Respiratory: Effort normal.  Neurological: She is alert and oriented to  person, place, and time.    Review of Systems  Constitutional: Negative.   Respiratory: Negative for cough and shortness of breath.   Cardiovascular: Negative for chest pain.  Psychiatric/Behavioral: Positive for depression (stable on medication) and substance abuse (ETOH). Negative for hallucinations and suicidal ideas. The patient is not nervous/anxious and does not have insomnia.     Blood pressure 127/85, pulse (!) 120, temperature 97.9 F (36.6 C), temperature source Oral, resp. rate 16, height 5\' 3"  (1.6 m), weight 121.6 kg, SpO2 98 %.Body mass index is 47.47 kg/m.  See MD's discharge SRA      Has this patient used any form of tobacco in the last 30 days? (Cigarettes, Smokeless Tobacco, Cigars, and/or Pipes)  No  Blood Alcohol level:  Lab Results  Component Value Date   ETH <10 05/01/2019    Metabolic Disorder Labs:  Lab Results   Component Value Date   HGBA1C 5.1 05/03/2019   MPG 99.67 05/03/2019   No results found for: PROLACTIN Lab Results  Component Value Date   CHOL 200 05/03/2019   TRIG 128 05/03/2019   HDL 41 05/03/2019   CHOLHDL 4.9 05/03/2019   VLDL 26 05/03/2019   LDLCALC 133 (H) 05/03/2019    See Psychiatric Specialty Exam and Suicide Risk Assessment completed by Attending Physician prior to discharge.  Discharge destination:  Home  Is patient on multiple antipsychotic therapies at discharge:  No   Has Patient had three or more failed trials of antipsychotic monotherapy by history:  No  Recommended Plan for Multiple Antipsychotic Therapies: NA  Discharge Instructions    Discharge instructions   Complete by: As directed    Patient is instructed to take all prescribed medications as recommended. Report any side effects or adverse reactions to your outpatient psychiatrist. Patient is instructed to abstain from alcohol and illegal drugs while on prescription medications. In the event of worsening symptoms, patient is instructed to call the crisis hotline, 911, or go to the nearest emergency department for evaluation and treatment.     Allergies as of 05/05/2019      Reactions   Toradol [ketorolac Tromethamine]    Tramadol Other (See Comments)   "Passed out for 4 hours, vomited uncontrollably"   Strawberry Extract Itching, Rash      Medication List    STOP taking these medications   acetaminophen-codeine 300-30 MG tablet Commonly known as: TYLENOL #3   DULoxetine 60 MG capsule Commonly known as: CYMBALTA   HYDROcodone-acetaminophen 10-325 MG tablet Commonly known as: NORCO   magic mouthwash w/lidocaine Soln   omeprazole 40 MG capsule Commonly known as: PRILOSEC   ondansetron 4 MG disintegrating tablet Commonly known as: Zofran ODT     TAKE these medications     Indication  ARIPiprazole 5 MG tablet Commonly known as: ABILIFY Take 1 tablet (5 mg total) by mouth  daily. Start taking on: May 06, 2019  Indication: Major Depressive Disorder   multivitamin with minerals Tabs tablet Take 1 tablet by mouth daily. Start taking on: May 06, 2019  Indication: Supplementation   pantoprazole 40 MG tablet Commonly known as: PROTONIX Take 1 tablet (40 mg total) by mouth daily. Start taking on: May 06, 2019  Indication: Gastroesophageal Reflux Disease   topiramate 50 MG tablet Commonly known as: TOPAMAX Take 1 tablet (50 mg total) by mouth 2 (two) times daily.  Indication: Migraine Headache   traZODone 50 MG tablet Commonly known as: DESYREL Take 1 tablet (50 mg total) by mouth at  bedtime and may repeat dose one time if needed. What changed:   when to take this  reasons to take this  Indication: Trouble Sleeping   venlafaxine XR 75 MG 24 hr capsule Commonly known as: EFFEXOR-XR Take 1 capsule (75 mg total) by mouth daily with breakfast. Start taking on: May 06, 2019 What changed:   medication strength  how much to take  when to take this  Indication: Major Depressive Disorder      Follow-up Information    [facility] Follow up on 05/14/2019.   Why: Medication management with Dr. Estanislado Emms is Wednesday, 10/14 at 9:00a.  A therapy referral has been made for you. The office will contact you with an appointment.  Contact information: [place] [address] ph: [phone] fx: [phone]          Follow-up recommendations: Activity as tolerated. Diet as recommended by primary care physician. Keep all scheduled follow-up appointments as recommended.   Comments:   Patient is instructed to take all prescribed medications as recommended. Report any side effects or adverse reactions to your outpatient psychiatrist. Patient is instructed to abstain from alcohol and illegal drugs while on prescription medications. In the event of worsening symptoms, patient is instructed to call the crisis hotline,  911, or go to the nearest emergency department for evaluation and treatment.  Signed: Connye Burkitt, NP 05/05/2019, 10:09 AM   Patient seen, Suicide Assessment Completed.  Disposition Plan Reviewed

## 2019-05-05 NOTE — Progress Notes (Signed)
  [facility] Adult Case Management Discharge Plan :  Will you be returning to the same living situation after discharge:  Yes,  patient reports she is returning home At discharge, do you have transportation home?: Yes,  patient reports her step-mother is picking her up  Do you have the ability to pay for your medications: Yes,  Medicaid  Release of information consent forms completed and in the chart;  Patient's signature needed at discharge.  Patient to Follow up at: Follow-up Information    [facility] Follow up on 05/14/2019.   Why: Medication management with Dr. Estanislado Emms is Wednesday, 10/14 at 9:00a.  A therapy referral has been made for you. The office will contact you with an appointment.  Contact information: [place] [address] ph: [phone] fx: [phone]          Next level of care provider has access to [place] and Suicide Prevention discussed: Yes,  with the patient's father     Has patient been referred to the Quitline?: N/A patient is not a smoker  Patient has been referred for addiction treatment: Pt. refused referral  Marylee Floras, [place] 05/05/2019, 10:07 AM

## 2019-05-05 NOTE — Tx Team (Signed)
Interdisciplinary Treatment and Diagnostic Plan Update  05/05/2019 Time of Session: 9:00am [name] MRN: [id number]  Principal Diagnosis: MDD (major depressive disorder), recurrent episode, severe (HCC)  Secondary Diagnoses: Principal Problem:   MDD (major depressive disorder), recurrent episode, severe (HCC)   Current Medications:  Current Facility-Administered Medications  Medication Dose Route Frequency Provider Last Rate Last Dose  . acetaminophen (TYLENOL) tablet 650 mg  650 mg Oral Q6H PRN Maryagnes Amos, FNP   650 mg at 05/05/19 0811  . ARIPiprazole (ABILIFY) tablet 5 mg  5 mg Oral Daily Cobos, Rockey Situ, MD   5 mg at 05/05/19 2878  . cloNIDine (CATAPRES) tablet 0.1 mg  0.1 mg Oral TID PRN Oneta Rack, NP   0.1 mg at 05/03/19 1829  . LORazepam (ATIVAN) tablet 0.5 mg  0.5 mg Oral Q6H PRN Cobos, Rockey Situ, MD   0.5 mg at 05/04/19 1430  . LORazepam (ATIVAN) tablet 1 mg  1 mg Oral Q6H PRN Cobos, Rockey Situ, MD   1 mg at 05/04/19 2105  . multivitamin with minerals tablet 1 tablet  1 tablet Oral Daily Cobos, Rockey Situ, MD   1 tablet at 05/05/19 [phone]  . pantoprazole (PROTONIX) EC tablet 40 mg  40 mg Oral Daily Cobos, Rockey Situ, MD   40 mg at 05/05/19 0811  . thiamine (VITAMIN B-1) tablet 100 mg  100 mg Oral Daily Cobos, Rockey Situ, MD   100 mg at 05/05/19 0811  . topiramate (TOPAMAX) tablet 50 mg  50 mg Oral BID Oneta Rack, NP   50 mg at 05/05/19 2094  . traZODone (DESYREL) tablet 50 mg  50 mg Oral QHS,MR X 1 Oneta Rack, NP   50 mg at 05/04/19 2105  . venlafaxine XR (EFFEXOR-XR) 24 hr capsule 75 mg  75 mg Oral Q breakfast Oneta Rack, NP   75 mg at 05/05/19 7096   PTA Medications: Medications Prior to Admission  Medication Sig Dispense Refill Last Dose  . acetaminophen-codeine (TYLENOL #3) 300-30 MG per tablet Take 1 tablet by mouth every 6 (six) hours as needed for moderate pain or severe pain.   0   . Alum & Mag Hydroxide-Simeth (MAGIC MOUTHWASH  W/LIDOCAINE) SOLN Take 5 mLs by mouth 3 (three) times daily as needed for mouth pain. (Patient not taking: Reported on 11/10/2014) 30 mL 0   . DULoxetine (CYMBALTA) 60 MG capsule Take 60 mg by mouth daily.     Marland Kitchen HYDROcodone-acetaminophen (NORCO) 10-325 MG per tablet Take 0.5-1 tablets by mouth every 4 (four) hours as needed for moderate pain. for pain  0   . omeprazole (PRILOSEC) 40 MG capsule Take 40 mg by mouth daily.     . ondansetron (ZOFRAN ODT) 4 MG disintegrating tablet 4mg  ODT q4 hours prn nausea/vomit. Please no more than 8 mg per day. (Patient not taking: Reported on 05/02/2019) 4 tablet 0   . traZODone (DESYREL) 50 MG tablet Take 50 mg by mouth at bedtime as needed for sleep.      07/02/2019 venlafaxine XR (EFFEXOR-XR) 37.5 MG 24 hr capsule Take 37.5 mg by mouth every morning.       Patient Stressors: Financial difficulties Health problems Marital or family conflict Medication change or noncompliance Substance abuse  Patient Strengths: Ability for insight Active sense of humor Average or above average intelligence Capable of independent living Communication skills Supportive family/friends  Treatment Modalities: Medication Management, Group therapy, Case management,  1 to 1 session with clinician, Psychoeducation,  Recreational therapy.   Physician Treatment Plan for Primary Diagnosis: MDD (major depressive disorder), recurrent episode, severe (HCC) Long Term Goal(s): Improvement in symptoms so as ready for discharge Improvement in symptoms so as ready for discharge   Short Term Goals: Ability to identify changes in lifestyle to reduce recurrence of condition will improve Ability to verbalize feelings will improve Ability to disclose and discuss suicidal ideas Ability to demonstrate self-control will improve Ability to identify and develop effective coping behaviors will improve Ability to maintain clinical measurements within normal limits will improve Ability to identify changes  in lifestyle to reduce recurrence of condition will improve Ability to verbalize feelings will improve Ability to disclose and discuss suicidal ideas Ability to demonstrate self-control will improve Ability to identify and develop effective coping behaviors will improve Ability to maintain clinical measurements within normal limits will improve  Medication Management: Evaluate patient's response, side effects, and tolerance of medication regimen.  Therapeutic Interventions: 1 to 1 sessions, Unit Group sessions and Medication administration.  Evaluation of Outcomes: Adequate for Discharge  Physician Treatment Plan for Secondary Diagnosis: Principal Problem:   MDD (major depressive disorder), recurrent episode, severe (HCC)  Long Term Goal(s): Improvement in symptoms so as ready for discharge Improvement in symptoms so as ready for discharge   Short Term Goals: Ability to identify changes in lifestyle to reduce recurrence of condition will improve Ability to verbalize feelings will improve Ability to disclose and discuss suicidal ideas Ability to demonstrate self-control will improve Ability to identify and develop effective coping behaviors will improve Ability to maintain clinical measurements within normal limits will improve Ability to identify changes in lifestyle to reduce recurrence of condition will improve Ability to verbalize feelings will improve Ability to disclose and discuss suicidal ideas Ability to demonstrate self-control will improve Ability to identify and develop effective coping behaviors will improve Ability to maintain clinical measurements within normal limits will improve     Medication Management: Evaluate patient's response, side effects, and tolerance of medication regimen.  Therapeutic Interventions: 1 to 1 sessions, Unit Group sessions and Medication administration.  Evaluation of Outcomes: Adequate for Discharge   RN Treatment Plan for Primary  Diagnosis: MDD (major depressive disorder), recurrent episode, severe (HCC) Long Term Goal(s): Knowledge of disease and therapeutic regimen to maintain health will improve  Short Term Goals: Ability to identify and develop effective coping behaviors will improve and Compliance with prescribed medications will improve  Medication Management: RN will administer medications as ordered by provider, will assess and evaluate patient's response and provide education to patient for prescribed medication. RN will report any adverse and/or side effects to prescribing provider.  Therapeutic Interventions: 1 on 1 counseling sessions, Psychoeducation, Medication administration, Evaluate responses to treatment, Monitor vital signs and CBGs as ordered, Perform/monitor CIWA, COWS, AIMS and Fall Risk screenings as ordered, Perform wound care treatments as ordered.  Evaluation of Outcomes: Adequate for Discharge   LCSW Treatment Plan for Primary Diagnosis: MDD (major depressive disorder), recurrent episode, severe (HCC) Long Term Goal(s): Safe transition to appropriate next level of care at discharge, Engage patient in therapeutic group addressing interpersonal concerns.  Short Term Goals: Engage patient in aftercare planning with referrals and resources, Increase social support, Increase emotional regulation, Identify triggers associated with mental health/substance abuse issues and Increase skills for wellness and recovery  Therapeutic Interventions: Assess for all discharge needs, 1 to 1 time with Social worker, Explore available resources and support systems, Assess for adequacy in community support network, Educate family and significant  other(s) on suicide prevention, Complete Psychosocial Assessment, Interpersonal group therapy.  Evaluation of Outcomes: Adequate for Discharge   Progress in Treatment: Attending groups: No. Participating in groups: No. Taking medication as prescribed: Yes. Toleration  medication: Yes. Family/Significant other contact made: No, will contact:  father. Patient understands diagnosis: Yes. Discussing patient identified problems/goals with staff: Yes. Medical problems stabilized or resolved: Yes. Denies suicidal/homicidal ideation: Yes. Issues/concerns per patient self-inventory: Yes.  New problem(s) identified: Yes, Describe:  financial stressors  New Short Term/Long Term Goal(s): medication management for mood stabilization; elimination of SI thoughts; development of comprehensive mental wellness/sobriety plan.  Patient Goals:  "To feel better."  Discharge Plan or Barriers: Returning home, following up with outpatient providers.   Reason for Continuation of Hospitalization: Anxiety Depression  Estimated Length of Stay: discharge today  Attendees: Patient: [name] 05/05/2019 8:51 AM  Physician: Larene Beach 05/05/2019 8:51 AM  Nursing: Benjamine Mola, RN 05/05/2019 8:51 AM  RN Care Manager: 05/05/2019 8:51 AM  Social Worker: Stephanie Acre, [place] 05/05/2019 8:51 AM  Recreational Therapist:  05/05/2019 8:51 AM  Other: Harriett Sine, NP 05/05/2019 8:51 AM  Other:  05/05/2019 8:51 AM  Other: 05/05/2019 8:51 AM    Scribe for Treatment Team: Joellen Jersey, [place] 05/05/2019 8:51 AM

## 2019-05-05 NOTE — Progress Notes (Signed)
Pt discharged to lobby. Pt was stable and appreciative at that time. All papers and prescriptions were given and valuables returned. Verbal understanding expressed. Denies SI/HI and A/VH. Pt given opportunity to express concerns and ask questions.  

## 2019-05-05 NOTE — Progress Notes (Signed)
Adult Psychoeducational Group Note  Date:  05/05/2019 Time:  4:03 AM  Group Topic/Focus:  Wrap-Up Group:   The focus of this group is to help patients review their daily goal of treatment and discuss progress on daily workbooks.  Participation Level:  Active  Participation Quality:  Appropriate  Affect:  Appropriate  Cognitive:  Appropriate  Insight: Appropriate  Engagement in Group:  Engaged  Modes of Intervention:  Discussion  Additional Comments:  Pt said her day was a 82. Her goal for today to get her blood pressure down.  Lenice Llamas Long 05/05/2019, 4:03 AM

## 2019-05-05 NOTE — [facility] Suicide Risk Assessment (Signed)
[facility] Discharge Suicide Risk Assessment   Principal Problem: MDD (major depressive disorder), recurrent episode, severe (HCC) Discharge Diagnoses: Principal Problem:   MDD (major depressive disorder), recurrent episode, severe (HCC)   Total Time spent with patient: 30 minutes  Musculoskeletal: Strength & Muscle Tone: within normal limits Gait & Station: normal Patient leans: N/A  Psychiatric Specialty Exam: ROS reports hip pain, which reports is chronic. Denies chest pain, no shortness of breath,no cough, no vomiting , no fever or chills   Blood pressure 127/85, pulse (!) 120, temperature 97.9 F (36.6 C), temperature source Oral, resp. rate 16, height 5\' 3"  (1.6 m), weight 121.6 kg, SpO2 98 %.Body mass index is 47.47 kg/m.  General Appearance: improving grooming   Eye Contact::  Good  Speech:  Normal Rate409  Volume:  Normal  Mood:  reports mood is improving and states " I feel a lot better"  Affect:  more reactive, fuller in range  Thought Process:  Linear and Descriptions of Associations: Intact  Orientation:  Other:  fully alert and attentive  Thought Content:  denies hallucinations, no delusions , not internally preoccupied   Suicidal Thoughts:  No, denies any suicidal or self injurious ideations.  Homicidal Thoughts:  No Denies homicidal or violent ideations  Memory:  recent and remote grossly intact   Judgement:  Other:  improving   Insight:  improving   Psychomotor Activity:  Normal  Concentration:  Good  Recall:  Good  Fund of Knowledge:Good  Language: Good  Akathisia:  Negative  Handed:  Right  AIMS (if indicated):     Assets:  Communication Skills Desire for Improvement Resilience  Sleep:  Number of Hours: 5.5  Cognition: WNL  ADL's:  Intact   Mental Status Per Nursing Assessment::   On Admission:  Suicidal ideation indicated by patient, Self-harm thoughts  Demographic Factors:  27, single, no children, on disability  Loss Factors: Recent relocation  from [facility] to [place], strained relationship with roommates in [place] prompted move , death of good friend earlier this year, relationship stressors  Historical Factors: No prior psychiatric admissions, suicide attempt at age 63, remote history of self cutting   Risk Reduction Factors:   Sense of responsibility to family and Positive coping skills or problem solving skills  Continued Clinical Symptoms:  Currently patient is alert, attentive, calm, pleasant on approach, reports her mood is " a lot better", affect is more reactive, and fuller in range, no thought disorder, no suicidal or self injurious ideations, no homicidal or violent ideations, no hallucinations, no delusions, presents future oriented. Currently denies medication side effects. Side effects reviewed. Behavior on unit calm and in good control, pleasant on approach. Reports she has a good support network , including supportive friends and family members   Cognitive Features That Contribute To Risk:  No gross cognitive deficits noted upon discharge. Is alert , attentive, and oriented x 3   Suicide Risk:  Mild:  Suicidal ideation of limited frequency, intensity, duration, and specificity.  There are no identifiable plans, no associated intent, mild dysphoria and related symptoms, good self-control (both objective and subjective assessment), few other risk factors, and identifiable protective factors, including available and accessible social support.  Follow-up Information    [facility] Follow up on 05/14/2019.   Why: Medication management with Dr. 05/16/2019 is Wednesday, 10/14 at 9:00a.  A therapy referral has been made for you. The office will contact you with an appointment.  Contact information: [address] [place] ph: [phone] fx: [phone]          Plan Of Care/Follow-up recommendations:  Activity:  as tolerated Diet:  regular Tests:  NA Other:  See below  Patient is expressing  readiness for discharge, requesting discharge today. No current grounds for involuntary commitment and plans to return home, follow up as above . She also plans to follow up with her PCP , Dr. Saunders Glance ,for medical issues as needed.  Jenne Campus, MD 05/05/2019, 9:57 AM

## 2019-05-05 NOTE — [facility] Suicide Risk Assessment (Signed)
[place] INPATIENT:  Family/Significant Other Suicide Prevention Education  Suicide Prevention Education:  Education Completed; with father, [name] [phone]) has been identified by the patient as the family member/significant other with whom the patient will be residing, and identified as the person(s) who will aid the patient in the event of a mental health crisis (suicidal ideations/suicide attempt).  With written consent from the patient, the family member/significant other has been provided the following suicide prevention education, prior to the and/or following the discharge of the patient.  The suicide prevention education provided includes the following:  Suicide risk factors  Suicide prevention and interventions  National Suicide Hotline telephone number  [facility] assessment telephone number  [facility] Emergency Assistance [place] and/or Residential Mobile Crisis Unit telephone number  Request made of family/significant other to:  Remove weapons (e.g., guns, rifles, knives), all items previously/currently identified as safety concern.    Remove drugs/medications (over-the-counter, prescriptions, illicit drugs), all items previously/currently identified as a safety concern.  The family member/significant other verbalizes understanding of the suicide prevention education information provided.  The family member/significant other agrees to remove the items of safety concern listed above.  [name] reports he does not have any questions or concerns regarding his daughter discharging home today. He states that he spoke to her over the weekend and feels that she has improved and is stable for discharge.   CSW will follow for a safe discharge.   Marylee Floras 05/05/2019, 9:10 AM

## 2019-05-05 NOTE — Progress Notes (Signed)
Recreation Therapy Notes  Date:  10.5.20 Time: 0930 Location: [address] Dayroom  Group Topic: Stress Management  Goal Area(s) Addresses:  Patient will identify positive stress management techniques. Patient will identify benefits of using stress management post d/c.  Intervention: Stress Management  Activity : Music.  Patients were given the opportunity to request songs that were soothing, peaceful and feel good.  Songs were to be appropriate meaning no cursing, not sexually explicit or any language about drug/alcohol use.   Education:  Stress Management, Discharge Planning.   Education Outcome: Acknowledges Education  Clinical Observations/Feedback: Pt did not attend activity.     Briauna Gilmartin, LRT/CTRS         Sharen Youngren A 05/05/2019 10:44 AM 

## 2019-05-05 NOTE — Progress Notes (Signed)
D.  Pt pleasant on approach, complaint of continued hip pain.  Pt states that she is in need of hip replacement surgery and that now it is just "bone on bone" rubbing together.  She has been told that she iwll need to lose 50 lbs before this can be done, but that is difficult for her due to difficulty with walking and being unable to walk without pain.  Pt denies SI/HI/AVH at this time.  A.  Support and encouragement offered, medication given as ordered.  R.  Pt remains safe on the unit, will continue to monitor.

## 2019-05-06 NOTE — Discharge Planning (Signed)
Prior authorization for Abilify completed through [facility] 05/06/19. PA # [id number].

## 2019-07-02 DIAGNOSIS — R300 Dysuria: Secondary | ICD-10-CM | POA: Diagnosis not present

## 2019-07-02 DIAGNOSIS — F419 Anxiety disorder, unspecified: Secondary | ICD-10-CM | POA: Diagnosis not present

## 2019-07-02 DIAGNOSIS — Z6841 Body Mass Index (BMI) 40.0 and over, adult: Secondary | ICD-10-CM | POA: Diagnosis not present

## 2019-07-02 DIAGNOSIS — F339 Major depressive disorder, recurrent, unspecified: Secondary | ICD-10-CM | POA: Diagnosis not present

## 2019-07-02 DIAGNOSIS — R0789 Other chest pain: Secondary | ICD-10-CM | POA: Diagnosis not present

## 2019-07-05 DIAGNOSIS — R05 Cough: Secondary | ICD-10-CM | POA: Diagnosis not present

## 2019-07-05 DIAGNOSIS — U071 COVID-19: Secondary | ICD-10-CM | POA: Diagnosis not present

## 2019-07-05 DIAGNOSIS — Z20828 Contact with and (suspected) exposure to other viral communicable diseases: Secondary | ICD-10-CM | POA: Diagnosis not present

## 2019-07-05 DIAGNOSIS — R5383 Other fatigue: Secondary | ICD-10-CM | POA: Diagnosis not present

## 2019-07-05 DIAGNOSIS — J101 Influenza due to other identified influenza virus with other respiratory manifestations: Secondary | ICD-10-CM | POA: Diagnosis not present

## 2019-07-05 DIAGNOSIS — R509 Fever, unspecified: Secondary | ICD-10-CM | POA: Diagnosis not present

## 2019-09-03 DIAGNOSIS — H5213 Myopia, bilateral: Secondary | ICD-10-CM | POA: Diagnosis not present

## 2019-09-05 DIAGNOSIS — Z20828 Contact with and (suspected) exposure to other viral communicable diseases: Secondary | ICD-10-CM | POA: Diagnosis not present

## 2019-09-05 DIAGNOSIS — R509 Fever, unspecified: Secondary | ICD-10-CM | POA: Diagnosis not present

## 2019-09-08 DIAGNOSIS — Z20828 Contact with and (suspected) exposure to other viral communicable diseases: Secondary | ICD-10-CM | POA: Diagnosis not present

## 2019-10-08 DIAGNOSIS — F331 Major depressive disorder, recurrent, moderate: Secondary | ICD-10-CM | POA: Diagnosis not present

## 2019-10-11 DIAGNOSIS — R1011 Right upper quadrant pain: Secondary | ICD-10-CM | POA: Diagnosis not present

## 2019-10-11 DIAGNOSIS — Z886 Allergy status to analgesic agent status: Secondary | ICD-10-CM | POA: Diagnosis not present

## 2019-10-11 DIAGNOSIS — Z3202 Encounter for pregnancy test, result negative: Secondary | ICD-10-CM | POA: Diagnosis not present

## 2019-10-11 DIAGNOSIS — F17210 Nicotine dependence, cigarettes, uncomplicated: Secondary | ICD-10-CM | POA: Diagnosis not present

## 2019-10-11 DIAGNOSIS — F419 Anxiety disorder, unspecified: Secondary | ICD-10-CM | POA: Diagnosis not present

## 2019-10-11 DIAGNOSIS — F319 Bipolar disorder, unspecified: Secondary | ICD-10-CM | POA: Diagnosis not present

## 2019-10-13 DIAGNOSIS — K8050 Calculus of bile duct without cholangitis or cholecystitis without obstruction: Secondary | ICD-10-CM | POA: Diagnosis not present

## 2019-10-13 DIAGNOSIS — R1011 Right upper quadrant pain: Secondary | ICD-10-CM | POA: Diagnosis not present

## 2019-10-15 DIAGNOSIS — R1011 Right upper quadrant pain: Secondary | ICD-10-CM | POA: Diagnosis not present

## 2019-10-20 DIAGNOSIS — R1011 Right upper quadrant pain: Secondary | ICD-10-CM | POA: Diagnosis not present

## 2019-10-20 DIAGNOSIS — R1010 Upper abdominal pain, unspecified: Secondary | ICD-10-CM | POA: Diagnosis not present

## 2019-10-22 DIAGNOSIS — R1011 Right upper quadrant pain: Secondary | ICD-10-CM | POA: Diagnosis not present

## 2019-10-28 DIAGNOSIS — F3132 Bipolar disorder, current episode depressed, moderate: Secondary | ICD-10-CM | POA: Diagnosis not present

## 2019-10-29 DIAGNOSIS — R591 Generalized enlarged lymph nodes: Secondary | ICD-10-CM | POA: Diagnosis not present

## 2019-10-29 DIAGNOSIS — L0390 Cellulitis, unspecified: Secondary | ICD-10-CM | POA: Diagnosis not present

## 2019-11-04 DIAGNOSIS — F3132 Bipolar disorder, current episode depressed, moderate: Secondary | ICD-10-CM | POA: Diagnosis not present

## 2019-11-11 DIAGNOSIS — F3132 Bipolar disorder, current episode depressed, moderate: Secondary | ICD-10-CM | POA: Diagnosis not present

## 2019-11-12 DIAGNOSIS — F3132 Bipolar disorder, current episode depressed, moderate: Secondary | ICD-10-CM | POA: Diagnosis not present

## 2019-11-18 DIAGNOSIS — F3132 Bipolar disorder, current episode depressed, moderate: Secondary | ICD-10-CM | POA: Diagnosis not present

## 2019-11-18 DIAGNOSIS — F649 Gender identity disorder, unspecified: Secondary | ICD-10-CM | POA: Diagnosis not present

## 2019-12-02 DIAGNOSIS — F649 Gender identity disorder, unspecified: Secondary | ICD-10-CM | POA: Diagnosis not present

## 2019-12-02 DIAGNOSIS — F3132 Bipolar disorder, current episode depressed, moderate: Secondary | ICD-10-CM | POA: Diagnosis not present

## 2019-12-16 DIAGNOSIS — F3132 Bipolar disorder, current episode depressed, moderate: Secondary | ICD-10-CM | POA: Diagnosis not present

## 2019-12-30 DIAGNOSIS — F649 Gender identity disorder, unspecified: Secondary | ICD-10-CM | POA: Diagnosis not present

## 2020-01-06 DIAGNOSIS — F3132 Bipolar disorder, current episode depressed, moderate: Secondary | ICD-10-CM | POA: Diagnosis not present

## 2020-01-13 DIAGNOSIS — F3132 Bipolar disorder, current episode depressed, moderate: Secondary | ICD-10-CM | POA: Diagnosis not present

## 2020-01-29 DIAGNOSIS — Z419 Encounter for procedure for purposes other than remedying health state, unspecified: Secondary | ICD-10-CM | POA: Diagnosis not present

## 2020-02-09 ENCOUNTER — Other Ambulatory Visit: Payer: Self-pay

## 2020-02-09 ENCOUNTER — Emergency Department (HOSPITAL_COMMUNITY)
Admission: EM | Admit: 2020-02-09 | Discharge: 2020-02-09 | Disposition: A | Discharge status: Home / Self Care | Payer: Medicaid Other | Attending: Emergency Medicine | Admitting: Emergency Medicine

## 2020-02-09 ENCOUNTER — Encounter (HOSPITAL_COMMUNITY): Payer: Self-pay | Admitting: Emergency Medicine

## 2020-02-09 DIAGNOSIS — R1030 Lower abdominal pain, unspecified: Secondary | ICD-10-CM | POA: Insufficient documentation

## 2020-02-09 DIAGNOSIS — Z5321 Procedure and treatment not carried out due to patient leaving prior to being seen by health care provider: Secondary | ICD-10-CM | POA: Diagnosis not present

## 2020-02-09 NOTE — ED Notes (Signed)
Pt calledx2, no answer 

## 2020-02-09 NOTE — ED Triage Notes (Signed)
Pt reports she woke up this am with severe pain to right lower abd, denies urinary symptoms

## 2020-02-10 DIAGNOSIS — R102 Pelvic and perineal pain: Secondary | ICD-10-CM | POA: Diagnosis not present

## 2020-02-10 DIAGNOSIS — M25552 Pain in left hip: Secondary | ICD-10-CM | POA: Diagnosis not present

## 2020-02-10 DIAGNOSIS — M0820 Juvenile rheumatoid arthritis with systemic onset, unspecified site: Secondary | ICD-10-CM | POA: Diagnosis not present

## 2020-02-19 DIAGNOSIS — Z789 Other specified health status: Secondary | ICD-10-CM | POA: Diagnosis not present

## 2020-02-24 DIAGNOSIS — F411 Generalized anxiety disorder: Secondary | ICD-10-CM | POA: Diagnosis not present

## 2020-02-27 DIAGNOSIS — M779 Enthesopathy, unspecified: Secondary | ICD-10-CM | POA: Diagnosis not present

## 2020-02-27 DIAGNOSIS — M25532 Pain in left wrist: Secondary | ICD-10-CM | POA: Diagnosis not present

## 2020-02-29 DIAGNOSIS — Z419 Encounter for procedure for purposes other than remedying health state, unspecified: Secondary | ICD-10-CM | POA: Diagnosis not present

## 2020-03-04 DIAGNOSIS — F411 Generalized anxiety disorder: Secondary | ICD-10-CM | POA: Diagnosis not present

## 2020-03-11 DIAGNOSIS — F411 Generalized anxiety disorder: Secondary | ICD-10-CM | POA: Diagnosis not present

## 2020-03-29 DIAGNOSIS — F411 Generalized anxiety disorder: Secondary | ICD-10-CM | POA: Diagnosis not present

## 2020-03-31 DIAGNOSIS — Z419 Encounter for procedure for purposes other than remedying health state, unspecified: Secondary | ICD-10-CM | POA: Diagnosis not present

## 2020-04-13 DIAGNOSIS — Z20828 Contact with and (suspected) exposure to other viral communicable diseases: Secondary | ICD-10-CM | POA: Diagnosis not present

## 2020-04-15 DIAGNOSIS — F411 Generalized anxiety disorder: Secondary | ICD-10-CM | POA: Diagnosis not present

## 2020-04-22 DIAGNOSIS — F411 Generalized anxiety disorder: Secondary | ICD-10-CM | POA: Diagnosis not present

## 2020-04-26 DIAGNOSIS — Z20822 Contact with and (suspected) exposure to covid-19: Secondary | ICD-10-CM | POA: Diagnosis not present

## 2020-04-26 DIAGNOSIS — Z20828 Contact with and (suspected) exposure to other viral communicable diseases: Secondary | ICD-10-CM | POA: Diagnosis not present

## 2020-04-29 DIAGNOSIS — F411 Generalized anxiety disorder: Secondary | ICD-10-CM | POA: Diagnosis not present

## 2020-04-30 DIAGNOSIS — Z419 Encounter for procedure for purposes other than remedying health state, unspecified: Secondary | ICD-10-CM | POA: Diagnosis not present

## 2020-05-11 DIAGNOSIS — F411 Generalized anxiety disorder: Secondary | ICD-10-CM | POA: Diagnosis not present

## 2020-05-12 DIAGNOSIS — J301 Allergic rhinitis due to pollen: Secondary | ICD-10-CM | POA: Insufficient documentation

## 2020-05-24 DIAGNOSIS — M082A Juvenile rheumatoid arthritis with systemic onset, other specified site: Secondary | ICD-10-CM | POA: Diagnosis not present

## 2020-05-24 DIAGNOSIS — M0880 Other juvenile arthritis, unspecified site: Secondary | ICD-10-CM | POA: Diagnosis not present

## 2020-05-28 DIAGNOSIS — F411 Generalized anxiety disorder: Secondary | ICD-10-CM | POA: Diagnosis not present

## 2020-05-31 DIAGNOSIS — Z419 Encounter for procedure for purposes other than remedying health state, unspecified: Secondary | ICD-10-CM | POA: Diagnosis not present

## 2020-05-31 DIAGNOSIS — R1110 Vomiting, unspecified: Secondary | ICD-10-CM | POA: Diagnosis not present

## 2020-06-21 DIAGNOSIS — F411 Generalized anxiety disorder: Secondary | ICD-10-CM | POA: Diagnosis not present

## 2020-06-30 DIAGNOSIS — Z419 Encounter for procedure for purposes other than remedying health state, unspecified: Secondary | ICD-10-CM | POA: Diagnosis not present

## 2020-07-03 DIAGNOSIS — R509 Fever, unspecified: Secondary | ICD-10-CM | POA: Diagnosis not present

## 2020-07-03 DIAGNOSIS — Z20828 Contact with and (suspected) exposure to other viral communicable diseases: Secondary | ICD-10-CM | POA: Diagnosis not present

## 2020-07-03 DIAGNOSIS — R5383 Other fatigue: Secondary | ICD-10-CM | POA: Diagnosis not present

## 2020-07-06 DIAGNOSIS — J019 Acute sinusitis, unspecified: Secondary | ICD-10-CM | POA: Diagnosis not present

## 2020-07-06 DIAGNOSIS — R059 Cough, unspecified: Secondary | ICD-10-CM | POA: Diagnosis not present

## 2020-07-13 DIAGNOSIS — F411 Generalized anxiety disorder: Secondary | ICD-10-CM | POA: Diagnosis not present

## 2020-07-19 DIAGNOSIS — M160 Bilateral primary osteoarthritis of hip: Secondary | ICD-10-CM | POA: Insufficient documentation

## 2020-07-19 DIAGNOSIS — F3175 Bipolar disorder, in partial remission, most recent episode depressed: Secondary | ICD-10-CM

## 2020-07-28 DIAGNOSIS — F4010 Social phobia, unspecified: Secondary | ICD-10-CM | POA: Insufficient documentation

## 2020-07-28 DIAGNOSIS — F424 Excoriation (skin-picking) disorder: Secondary | ICD-10-CM | POA: Insufficient documentation

## 2020-07-28 DIAGNOSIS — F3132 Bipolar disorder, current episode depressed, moderate: Secondary | ICD-10-CM | POA: Diagnosis not present

## 2020-07-31 DIAGNOSIS — Z419 Encounter for procedure for purposes other than remedying health state, unspecified: Secondary | ICD-10-CM | POA: Diagnosis not present

## 2020-08-07 ENCOUNTER — Emergency Department (HOSPITAL_COMMUNITY): Payer: Medicaid Other

## 2020-08-07 ENCOUNTER — Emergency Department (HOSPITAL_COMMUNITY)
Admission: EM | Admit: 2020-08-07 | Discharge: 2020-08-08 | Disposition: A | Discharge status: Home / Self Care | Payer: Medicaid Other | Attending: Emergency Medicine | Admitting: Emergency Medicine

## 2020-08-07 ENCOUNTER — Other Ambulatory Visit: Payer: Self-pay

## 2020-08-07 ENCOUNTER — Encounter (HOSPITAL_COMMUNITY): Payer: Self-pay | Admitting: Emergency Medicine

## 2020-08-07 DIAGNOSIS — R202 Paresthesia of skin: Secondary | ICD-10-CM | POA: Insufficient documentation

## 2020-08-07 DIAGNOSIS — Z87891 Personal history of nicotine dependence: Secondary | ICD-10-CM | POA: Diagnosis not present

## 2020-08-07 DIAGNOSIS — R0789 Other chest pain: Secondary | ICD-10-CM | POA: Insufficient documentation

## 2020-08-07 DIAGNOSIS — R200 Anesthesia of skin: Secondary | ICD-10-CM

## 2020-08-07 DIAGNOSIS — F419 Anxiety disorder, unspecified: Secondary | ICD-10-CM

## 2020-08-07 DIAGNOSIS — I499 Cardiac arrhythmia, unspecified: Secondary | ICD-10-CM | POA: Diagnosis not present

## 2020-08-07 DIAGNOSIS — Z743 Need for continuous supervision: Secondary | ICD-10-CM | POA: Diagnosis not present

## 2020-08-07 DIAGNOSIS — R079 Chest pain, unspecified: Secondary | ICD-10-CM | POA: Diagnosis not present

## 2020-08-07 DIAGNOSIS — R29818 Other symptoms and signs involving the nervous system: Secondary | ICD-10-CM | POA: Diagnosis not present

## 2020-08-07 LAB — BASIC METABOLIC PANEL
Anion gap: 8 (ref 5–15)
BUN: 9 mg/dL (ref 6–20)
CO2: 23 mmol/L (ref 22–32)
Calcium: 9.2 mg/dL (ref 8.9–10.3)
Chloride: 104 mmol/L (ref 98–111)
Creatinine, Ser: 0.99 mg/dL (ref 0.44–1.00)
GFR, Estimated: >60 mL/min (ref >60)
Glucose, Bld: 99 mg/dL (ref 70–99)
Potassium: 3.7 mmol/L (ref 3.5–5.1)
Sodium: 135 mmol/L (ref 135–145)

## 2020-08-07 LAB — CBC
HCT: 40.8 % (ref 36.0–46.0)
Hemoglobin: 13.2 g/dL (ref 12.0–15.0)
MCH: 28.7 pg (ref 26.0–34.0)
MCHC: 32.4 g/dL (ref 30.0–36.0)
MCV: 88.7 fL (ref 80.0–100.0)
Platelets: 316 10*3/uL (ref 150–400)
RBC: 4.6 MIL/uL (ref 3.87–5.11)
RDW: 15 % (ref 11.5–15.5)
WBC: 14.4 10*3/uL — ABNORMAL HIGH (ref 4.0–10.5)
nRBC: 0 % (ref 0.0–0.2)

## 2020-08-07 LAB — POC URINE PREG, ED: Preg Test, Ur: NEGATIVE

## 2020-08-07 LAB — TROPONIN I (HIGH SENSITIVITY): Troponin I (High Sensitivity): <2 ng/L (ref <18)

## 2020-08-07 NOTE — ED Provider Notes (Signed)
[facility] EMERGENCY DEPARTMENT Provider Note   CSN: [id number] Arrival date & time: 08/07/20  1514     History Chief Complaint  Patient presents with  . Chest Pain    [name] Reason is a 30 y.o. female.  Patient was brought in by EMS.  But went to triage.  On arrival patient alert and oriented.  Airway was patent.  She felt as if she had some tightness in her throat.  She complained of some slight left-sided chest pain may be substernal.  Had actually numbness to her left arm and left leg.  Some of that numbness is still present.  But there is no weakness.  Patient also felt short of breath and had some lightheadedness.  Patient had vomited 4 hours prior to the chest pain.  Denies any nausea or feeling like she is going to throw up now.  Patient has Still's disease.  Patient was concerned that she may have had a stroke.  She had no speech problems no visual problems.  Just the numbness on the left side of the body there was no weakness.  She states the chest pain was now gone and was fairly minimal.  The onset of the symptoms or shortly prior to arrival.  Patient got here around 1500.  Patient stated that there may have been a panic component.        Past Medical History:  Diagnosis Date  . Pericarditis   . RA (rheumatoid arthritis) (HCC)   . Still disease, 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HCC)   . 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HCC)   . 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HCC)   . Still's disease [facility])     Patient Active Problem List   Diagnosis Date Noted  . Bipolar I disorder, current or most recent episode depressed, in partial remission (HCC)   . MDD (major depressive disorder), recurrent episode, severe (HCC) 05/02/2019  . 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    Past Surgical History:  Procedure Laterality Date  . WISDOM TOOTH EXTRACTION       OB History    Gravida  0   Para  0   Term  0   Preterm  0   AB  0   Living  0     SAB  0   IAB  0   Ectopic  0   Multiple  0   Live Births  0            History reviewed. No pertinent family history.  Social History   Tobacco Use  . Smoking status: Former Smoker    Packs/day: 1.00    Years: 5.00    Pack years: 5.00    Types: Cigarettes    Start date: 07/27/2009    Quit date: 02/29/2020    Years since quitting: 0.4  . Smokeless tobacco: Never Used  . Tobacco comment: patient smoked her last cigarette 3 days ago 05/05/14 due to having a tooth pulled  Vaping Use  . Vaping Use: Every day  . Substances: Nicotine, Flavoring  Substance Use Topics  . Alcohol use: Not Currently  . Drug use: Yes    Types: Marijuana    Home Medications Prior to Admission medications   Medication Sig Start Date End Date Taking? Authorizing Provider  ARIPiprazole (ABILIFY) 5 MG tablet Take 1 tablet (5 mg total) by mouth daily. 05/06/19  Yes Aldean Baker, NP  clonazePAM (KLONOPIN) 1 MG tablet Take 1 mg by mouth daily. 07/28/20  Yes [provider]  ibuprofen (  ADVIL) 800 MG tablet Take 800 mg by mouth every 8 (eight) hours as needed. 09/29/19  Yes [provider]  lithium carbonate 150 MG capsule Take 150 mg by mouth daily. 11/12/19  Yes [provider]  omeprazole (PRILOSEC) 40 MG capsule Take 40 mg by mouth daily. 02/19/20  Yes [provider]  ondansetron (ZOFRAN) 8 MG tablet Take 8 mg by mouth every 8 (eight) hours as needed. 06/21/20  Yes [provider]  venlafaxine XR (EFFEXOR-XR) 75 MG 24 hr capsule Take 1 capsule (75 mg total) by mouth daily with breakfast. Patient taking differently: Take 75 mg by mouth 3 (three) times daily before meals. 05/06/19  Yes Aldean Baker, NP  Adalimumab 40 MG/0.4ML PNKT Inject into the skin. Patient not taking: Reported on 08/07/2020 09/22/19   [provider]  amoxicillin-clavulanate (AUGMENTIN) 875-125 MG tablet Take 1 tablet by mouth 2 (two) times daily. Patient not taking: Reported on 08/07/2020 07/06/20   [provider]  benzonatate (TESSALON) 100 MG capsule  Take 100 mg by mouth 3 (three) times daily. Patient not taking: Reported on 08/07/2020 07/07/20   [provider]  Multiple Vitamin (MULTIVITAMIN WITH MINERALS) TABS tablet Take 1 tablet by mouth daily. Patient not taking: No sig reported 05/06/19   Aldean Baker, NP  pantoprazole (PROTONIX) 40 MG tablet Take 1 tablet (40 mg total) by mouth daily. Patient not taking: No sig reported 05/06/19   Aldean Baker, NP  topiramate (TOPAMAX) 50 MG tablet Take 1 tablet (50 mg total) by mouth 2 (two) times daily. Patient not taking: No sig reported 05/05/19   Aldean Baker, NP  traZODone (DESYREL) 50 MG tablet Take 1 tablet (50 mg total) by mouth at bedtime and may repeat dose one time if needed. Patient not taking: No sig reported 05/05/19   Aldean Baker, NP    Allergies    Toradol [ketorolac tromethamine], Tramadol, and Strawberry extract  Review of Systems   Review of Systems  Constitutional: Negative for chills and fever.  HENT: Positive for trouble swallowing. Negative for congestion, rhinorrhea, sore throat and voice change.   Eyes: Negative for visual disturbance.  Respiratory: Positive for shortness of breath. Negative for cough.   Cardiovascular: Positive for chest pain. Negative for leg swelling.  Gastrointestinal: Positive for nausea and vomiting. Negative for abdominal pain and diarrhea.  Genitourinary: Negative for dysuria.  Musculoskeletal: Negative for back pain and neck pain.  Skin: Negative for rash.  Neurological: Positive for light-headedness and numbness. Negative for dizziness and headaches.  Hematological: Does not bruise/bleed easily.  Psychiatric/Behavioral: Negative for confusion.    Physical Exam Updated Vital Signs BP 108/76   Pulse 68   Temp 98.2 F (36.8 C) (Oral)   Resp 18   Ht 1.6 [name] (5\' 3" )   Wt 117.9 kg   LMP 07/24/2020   SpO2 100%   BMI 46.06 kg/[name]   Physical Exam Vitals and nursing note reviewed.  Constitutional:      General: She is not in  acute distress.    Appearance: Normal appearance. She is well-developed and well-nourished.  HENT:     Head: Normocephalic and atraumatic.     Mouth/Throat:     Mouth: Mucous membranes are moist.     Pharynx: Oropharynx is clear.  Eyes:     Extraocular Movements: Extraocular movements intact.     Conjunctiva/sclera: Conjunctivae normal.     Pupils: Pupils are equal, round, and reactive to light.  Cardiovascular:  Rate and Rhythm: Normal rate and regular rhythm.     Heart sounds: No murmur heard.   Pulmonary:     Effort: Pulmonary effort is normal. No respiratory distress.     Breath sounds: Normal breath sounds. No stridor. No wheezing or rales.  Chest:     Chest wall: No tenderness.  Abdominal:     Palpations: Abdomen is soft.     Tenderness: There is no abdominal tenderness.  Musculoskeletal:        General: No edema. Normal range of motion.     Cervical back: Normal range of motion and neck supple.  Skin:    General: Skin is warm and dry.  Neurological:     General: No focal deficit present.     Mental Status: She is alert and oriented to person, place, and time.     Cranial Nerves: No cranial nerve deficit.     Sensory: Sensory deficit present.     Motor: No weakness.     Coordination: Coordination normal.     Comments: More so of a subjective sensory change left upper extremity left lower extremity.  Psychiatric:        Mood and Affect: Mood and affect normal.     ED Results / Procedures / Treatments   Labs (all labs ordered are listed, but only abnormal results are displayed) Labs Reviewed  CBC - Abnormal; Notable for the following components:      Result Value   WBC 14.4 (*)    All other components within normal limits  BASIC METABOLIC PANEL  POC URINE PREG, ED  TROPONIN I (HIGH SENSITIVITY)  TROPONIN I (HIGH SENSITIVITY)    EKG EKG Interpretation  Date/Time:  Saturday August 07 2020 15:33:07 EST Ventricular Rate:  123 PR Interval:  164 QRS  Duration: 70 QT Interval:  296 QTC Calculation: 423 R Axis:   120 Text Interpretation: Sinus tachycardia Right axis deviation Abnormal ECG Confirmed by Vanetta Mulders [phone]) on 08/07/2020 8:20:34 PM   Radiology DG Chest 2 View  Result Date: 08/07/2020 CLINICAL DATA:  Chest pain. EXAM: CHEST - 2 VIEW COMPARISON:  January 22, 2014 FINDINGS: The heart size and mediastinal contours are within normal limits. Both lungs are clear. The visualized skeletal structures are unremarkable. IMPRESSION: No active cardiopulmonary disease. Electronically Signed   By: Katherine Mantle [name].D.   On: 08/07/2020 16:20   CT Head Wo Contrast  Result Date: 08/07/2020 CLINICAL DATA:  Neuro deficit, acute, stroke suspected Left arm numbness, onset today. EXAM: CT HEAD WITHOUT CONTRAST TECHNIQUE: Contiguous axial images were obtained from the base of the skull through the vertex without intravenous contrast. COMPARISON:  Report from brain MRI 11/09/2015. FINDINGS: Brain: No intracranial hemorrhage, mass effect, or midline shift. No hydrocephalus. The basilar cisterns are patent. No evidence of territorial infarct or acute ischemia. No extra-axial or intracranial fluid collection. Vascular: No hyperdense vessel or unexpected calcification. Skull: Normal. Negative for fracture or focal lesion. Sinuses/Orbits: Paranasal sinuses and mastoid air cells are clear. The visualized orbits are unremarkable. Other: None. IMPRESSION: Negative noncontrast head CT. Electronically Signed   By: Narda Rutherford [name].D.   On: 08/07/2020 21:23    Procedures Procedures (including critical care time)  Medications Ordered in ED Medications - No data to display  ED Course  I have reviewed the triage vital signs and the nursing notes.  Pertinent labs & imaging results that were available during my care of the patient were reviewed by me and considered in my  medical decision making (see chart for details).    MDM Rules/Calculators/A&P                           Patient without any trouble talking or swallowing.  There was some subjective numbness.  Head CT negative.  Troponin negative.  Do not feel delta troponin was necessary.  CBC with a white count of 14.4.  But this could maybe be related to her stills disease.  Records show that her white count is normally elevated a little bit.  Patient states that she is not currently on steroids   X-ray without any acute findings.  EKG showed sinus tachycardia.   Patient with observation here with significant improvement.  Head CT was done just to rule out any gross abnormalities.  Since symptoms had started around 1500.  It was negative.  Patient states all symptoms now have resolved.  I think stable for discharge home and follow-up with her doctors.  Doubt pulmonary embolus.  Oxygen saturations were 95% on room air.  Sinus tachycardia improved.  Doubt acute cardiac event.  Initial troponin was negative and was approximately 6 hours old at the time that it was drawn.  Or 6 hours into the chest discomfort the patient was not terribly concerned about the mild left sided chest discomfort.   Final Clinical Impression(s) / ED Diagnoses Final diagnoses:  Atypical chest pain  Numbness  Anxiety    Rx / DC Orders ED Discharge Orders    None       Vanetta Mulders, MD 08/07/20 2341

## 2020-08-07 NOTE — ED Triage Notes (Signed)
Patient brought in via EMS from home. Alert and oriented. Airway patent. Patient c/o left side chest pain with numbness in left arm. Per patient resided but left arm numbness still present. Per patient shortness of breath and lightheadedness. Patient reports vomiting 4 hours prior to chest pain. Denies any nausea now.

## 2020-08-07 NOTE — ED Notes (Signed)
Patient transported to CT 

## 2020-08-07 NOTE — Discharge Instructions (Addendum)
Work-up for the chest pain left-sided numbness and the tightness in the throat area without any acute findings.  May have been a bit of a panic attack related to some anxiety.  Make an appointment to follow-up with your doctor.  Return for any new or worse symptoms

## 2020-08-07 NOTE — ED Notes (Signed)
Pt ambulatory to bathroom with walker- steady gait noted.

## 2020-08-08 LAB — TROPONIN I (HIGH SENSITIVITY): Troponin I (High Sensitivity): <2 ng/L (ref <18)

## 2020-08-09 DIAGNOSIS — Z20822 Contact with and (suspected) exposure to covid-19: Secondary | ICD-10-CM | POA: Diagnosis not present

## 2020-08-09 DIAGNOSIS — Z20828 Contact with and (suspected) exposure to other viral communicable diseases: Secondary | ICD-10-CM | POA: Diagnosis not present

## 2020-08-09 DIAGNOSIS — R200 Anesthesia of skin: Secondary | ICD-10-CM | POA: Diagnosis not present

## 2020-08-23 DIAGNOSIS — Z20828 Contact with and (suspected) exposure to other viral communicable diseases: Secondary | ICD-10-CM | POA: Diagnosis not present

## 2020-08-31 DIAGNOSIS — Z419 Encounter for procedure for purposes other than remedying health state, unspecified: Secondary | ICD-10-CM | POA: Diagnosis not present

## 2020-09-06 DIAGNOSIS — F640 Transsexualism: Secondary | ICD-10-CM | POA: Diagnosis not present

## 2020-09-10 DIAGNOSIS — F411 Generalized anxiety disorder: Secondary | ICD-10-CM | POA: Diagnosis not present

## 2020-09-15 DIAGNOSIS — Z23 Encounter for immunization: Secondary | ICD-10-CM | POA: Diagnosis not present

## 2020-09-28 DIAGNOSIS — Z419 Encounter for procedure for purposes other than remedying health state, unspecified: Secondary | ICD-10-CM | POA: Diagnosis not present

## 2020-10-05 DIAGNOSIS — F411 Generalized anxiety disorder: Secondary | ICD-10-CM | POA: Diagnosis not present

## 2020-10-08 DIAGNOSIS — J019 Acute sinusitis, unspecified: Secondary | ICD-10-CM | POA: Diagnosis not present

## 2020-10-08 DIAGNOSIS — Z20828 Contact with and (suspected) exposure to other viral communicable diseases: Secondary | ICD-10-CM | POA: Diagnosis not present

## 2020-10-27 DIAGNOSIS — F640 Transsexualism: Secondary | ICD-10-CM | POA: Diagnosis not present

## 2020-10-29 DIAGNOSIS — Z419 Encounter for procedure for purposes other than remedying health state, unspecified: Secondary | ICD-10-CM | POA: Diagnosis not present

## 2020-10-29 DIAGNOSIS — F411 Generalized anxiety disorder: Secondary | ICD-10-CM | POA: Diagnosis not present

## 2020-10-31 DIAGNOSIS — H5213 Myopia, bilateral: Secondary | ICD-10-CM | POA: Diagnosis not present

## 2020-11-19 DIAGNOSIS — R109 Unspecified abdominal pain: Secondary | ICD-10-CM | POA: Diagnosis not present

## 2020-11-19 DIAGNOSIS — M549 Dorsalgia, unspecified: Secondary | ICD-10-CM | POA: Diagnosis not present

## 2020-11-19 DIAGNOSIS — K2970 Gastritis, unspecified, without bleeding: Secondary | ICD-10-CM | POA: Diagnosis not present

## 2020-11-19 DIAGNOSIS — Z885 Allergy status to narcotic agent status: Secondary | ICD-10-CM | POA: Diagnosis not present

## 2020-11-19 DIAGNOSIS — R1011 Right upper quadrant pain: Secondary | ICD-10-CM | POA: Diagnosis not present

## 2020-11-19 DIAGNOSIS — Z79899 Other long term (current) drug therapy: Secondary | ICD-10-CM | POA: Diagnosis not present

## 2020-11-19 DIAGNOSIS — F319 Bipolar disorder, unspecified: Secondary | ICD-10-CM | POA: Diagnosis not present

## 2020-11-28 DIAGNOSIS — Z419 Encounter for procedure for purposes other than remedying health state, unspecified: Secondary | ICD-10-CM | POA: Diagnosis not present

## 2020-12-01 DIAGNOSIS — K219 Gastro-esophageal reflux disease without esophagitis: Secondary | ICD-10-CM | POA: Diagnosis not present

## 2020-12-01 DIAGNOSIS — Z20828 Contact with and (suspected) exposure to other viral communicable diseases: Secondary | ICD-10-CM | POA: Diagnosis not present

## 2020-12-01 DIAGNOSIS — R1011 Right upper quadrant pain: Secondary | ICD-10-CM | POA: Diagnosis not present

## 2020-12-01 DIAGNOSIS — R079 Chest pain, unspecified: Secondary | ICD-10-CM | POA: Diagnosis not present

## 2020-12-10 DIAGNOSIS — F4010 Social phobia, unspecified: Secondary | ICD-10-CM | POA: Diagnosis not present

## 2020-12-10 DIAGNOSIS — F3132 Bipolar disorder, current episode depressed, moderate: Secondary | ICD-10-CM | POA: Diagnosis not present

## 2020-12-10 DIAGNOSIS — F424 Excoriation (skin-picking) disorder: Secondary | ICD-10-CM | POA: Diagnosis not present

## 2020-12-15 DIAGNOSIS — F411 Generalized anxiety disorder: Secondary | ICD-10-CM | POA: Diagnosis not present

## 2020-12-24 DIAGNOSIS — R933 Abnormal findings on diagnostic imaging of other parts of digestive tract: Secondary | ICD-10-CM | POA: Diagnosis not present

## 2020-12-24 DIAGNOSIS — G8929 Other chronic pain: Secondary | ICD-10-CM | POA: Diagnosis not present

## 2020-12-24 DIAGNOSIS — R1011 Right upper quadrant pain: Secondary | ICD-10-CM | POA: Diagnosis not present

## 2020-12-27 DIAGNOSIS — R109 Unspecified abdominal pain: Secondary | ICD-10-CM | POA: Diagnosis not present

## 2020-12-27 DIAGNOSIS — N83202 Unspecified ovarian cyst, left side: Secondary | ICD-10-CM | POA: Diagnosis not present

## 2020-12-27 DIAGNOSIS — K760 Fatty (change of) liver, not elsewhere classified: Secondary | ICD-10-CM | POA: Diagnosis not present

## 2020-12-27 DIAGNOSIS — F17200 Nicotine dependence, unspecified, uncomplicated: Secondary | ICD-10-CM | POA: Diagnosis not present

## 2020-12-29 DIAGNOSIS — Z419 Encounter for procedure for purposes other than remedying health state, unspecified: Secondary | ICD-10-CM | POA: Diagnosis not present

## 2020-12-29 DIAGNOSIS — F411 Generalized anxiety disorder: Secondary | ICD-10-CM | POA: Diagnosis not present

## 2021-01-06 DIAGNOSIS — Z79899 Other long term (current) drug therapy: Secondary | ICD-10-CM | POA: Diagnosis not present

## 2021-01-10 DIAGNOSIS — F411 Generalized anxiety disorder: Secondary | ICD-10-CM | POA: Diagnosis not present

## 2021-01-28 DIAGNOSIS — Z419 Encounter for procedure for purposes other than remedying health state, unspecified: Secondary | ICD-10-CM | POA: Diagnosis not present

## 2021-01-31 DIAGNOSIS — R0602 Shortness of breath: Secondary | ICD-10-CM | POA: Diagnosis not present

## 2021-01-31 DIAGNOSIS — M546 Pain in thoracic spine: Secondary | ICD-10-CM | POA: Diagnosis not present

## 2021-01-31 DIAGNOSIS — M7918 Myalgia, other site: Secondary | ICD-10-CM | POA: Diagnosis not present

## 2021-01-31 DIAGNOSIS — R079 Chest pain, unspecified: Secondary | ICD-10-CM | POA: Diagnosis not present

## 2021-01-31 DIAGNOSIS — Z885 Allergy status to narcotic agent status: Secondary | ICD-10-CM | POA: Diagnosis not present

## 2021-02-01 DIAGNOSIS — Z20828 Contact with and (suspected) exposure to other viral communicable diseases: Secondary | ICD-10-CM | POA: Diagnosis not present

## 2021-02-01 DIAGNOSIS — R079 Chest pain, unspecified: Secondary | ICD-10-CM | POA: Diagnosis not present

## 2021-02-14 ENCOUNTER — Encounter: Payer: Self-pay | Admitting: *Deleted

## 2021-02-15 ENCOUNTER — Encounter: Payer: Self-pay | Admitting: Cardiology

## 2021-02-15 ENCOUNTER — Other Ambulatory Visit: Payer: Self-pay

## 2021-02-15 ENCOUNTER — Telehealth: Payer: Self-pay | Admitting: Cardiology

## 2021-02-15 ENCOUNTER — Ambulatory Visit (INDEPENDENT_AMBULATORY_CARE_PROVIDER_SITE_OTHER): Payer: Medicaid Other | Admitting: Cardiology

## 2021-02-15 VITALS — BP 106/80 | HR 96 | Ht 63.0 in | Wt 266.6 lb

## 2021-02-15 DIAGNOSIS — R079 Chest pain, unspecified: Secondary | ICD-10-CM | POA: Diagnosis not present

## 2021-02-15 MED ORDER — IBUPROFEN 800 MG PO TABS: 800.0000 mg | ORAL_TABLET | Freq: Two times a day (BID) | ORAL | 0 refills | Status: AC

## 2021-02-15 NOTE — Progress Notes (Signed)
Clinical Summary Ms. Heinsohn is a 30 y.o.female seen as a new patient for the following medical problems. Last seen in our office in 2015.   1. Chest pain  From prior visit:  - on and off for several years. Occurs approx 1-2 times a day. Feels increased frequency and severity over the last year. Squeezing feeling left side, 5/10. Can occur at rest or with exertion. No other associated symptoms. Not positional. Pain lasts up to 10 minutes. Nothing makes pain better. - seen in ER 12/2013 with chest pain. Cosntant 8/10 left sided, lasted 5 hours, worst with position. 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 She reports a long hospitalization at [facility] in [place], [place]. She states she was treated with prednisone at the time for ongoing chest pain due to pericarditis and joint pain, pain somewhat improved with predinsone. - from notes had chest pain at that time, trop of 2.4, non-specific EKG changes but clinical symptoms. CT PE negative. +rub on exam. Symptoms improved with NSAIDs and steroids - can walk a mile on flatground, some DOE with incline that is stable. No LE edema.     Current visit:   ER visit 01/31/2021 with chest pain [facility] - trop neg x1. CXR no acute process. EKG report (tracing not available) SR, no ischemic changes  - chest pain onoing few months - left sided upper chest, sometimes under left breast. Tightness, can occur at rest or with exertion. Worst with deep breathing. Can have some associated SOB. 5/10 in severity. Pain can last up 6-7 hours - psych tried higher klonopin without benefit.  - has taken ibuprofen 810m once daily - similar to her prior chest pains.         2. Still's disease - [facility] reviewed from his  last note 04/01/14 which appears to have been there first visit together. - hx of juvenile RA diagnosed age 89 previously on prednisone, methotrexate in the past. Due to weight gain she has been resistant to continued predisone. Past Medical History:  Diagnosis Date   Pericarditis    RA (rheumatoid arthritis) ([place]    Still disease, 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[place]    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[place]    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[facility]    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[facility]      Allergies  Allergen Reactions   Toradol [Ketorolac Tromethamine]    Tramadol Other (See Comments)    "Passed out for 4 hours, vomited uncontrollably"   Strawberry Extract Itching and Rash     Current Outpatient Medications  Medication Sig Dispense Refill   Adalimumab 40 MG/0.4ML PNKT Inject into the skin. (Patient not taking: Reported on 08/07/2020)     amoxicillin-clavulanate (AUGMENTIN) 875-125 MG tablet Take 1 tablet by mouth 2 (two) times daily. (Patient not taking: Reported on 08/07/2020)     ARIPiprazole (ABILIFY) 5 MG tablet Take 1 tablet (5 mg total) by mouth daily. 30 tablet 0   benzonatate (TESSALON) 100 MG capsule Take 100 mg by mouth 3 (three) times daily. (Patient not taking: Reported on 08/07/2020)     clonazePAM (KLONOPIN) 1 MG tablet Take 1 mg by mouth daily.     ibuprofen (ADVIL) 800 MG tablet Take 800 mg by mouth every 8 (eight) hours as needed.     lithium carbonate  150 MG capsule Take 150 mg by mouth daily.     Multiple Vitamin (MULTIVITAMIN WITH MINERALS) TABS tablet Take 1 tablet by mouth daily. (Patient not taking: No sig reported)     omeprazole (PRILOSEC) 40 MG capsule Take 40 mg by mouth daily.     ondansetron (ZOFRAN) 8 MG tablet Take 8 mg by mouth every 8 (eight) hours as needed.     pantoprazole (PROTONIX) 40 MG tablet Take 1 tablet (40 mg total) by mouth daily. (Patient not taking: No sig reported) 30 tablet 0   topiramate (TOPAMAX) 50 MG tablet Take 1 tablet (50 mg total) by mouth 2 (two) times daily. (Patient not taking: No sig  reported) 60 tablet 0   traZODone (DESYREL) 50 MG tablet Take 1 tablet (50 mg total) by mouth at bedtime and may repeat dose one time if needed. (Patient not taking: No sig reported) 15 tablet 0   venlafaxine XR (EFFEXOR-XR) 75 MG 24 hr capsule Take 1 capsule (75 mg total) by mouth daily with breakfast. (Patient taking differently: Take 75 mg by mouth 3 (three) times daily before meals.) 30 capsule 0   No current facility-administered medications for this visit.     Past Surgical History:  Procedure Laterality Date   WISDOM TOOTH EXTRACTION       Allergies  Allergen Reactions   Toradol [Ketorolac Tromethamine]    Tramadol Other (See Comments)    "Passed out for 4 hours, vomited uncontrollably"   Strawberry Extract Itching and Rash      Family History  Problem Relation Age of Onset   Heart disease Mother    Depression Father    Rheum arthritis Father      Social History Ms. Bellew reports that she quit smoking about a year ago. Her smoking use included cigarettes. She started smoking about 11 years ago. She has a 5.00 pack-year smoking history. She has never used smokeless tobacco. Ms. Douthitt reports previous alcohol use.   Review of Systems CONSTITUTIONAL: No weight loss, fever, chills, weakness or fatigue.  HEENT: Eyes: No visual loss, blurred vision, double vision or yellow sclerae.No hearing loss, sneezing, congestion, runny nose or sore throat.  SKIN: No rash or itching.  CARDIOVASCULAR: per hpi RESPIRATORY: No shortness of breath, cough or sputum.  GASTROINTESTINAL: No anorexia, nausea, vomiting or diarrhea. No abdominal pain or blood.  GENITOURINARY: No burning on urination, no polyuria NEUROLOGICAL: No headache, dizziness, syncope, paralysis, ataxia, numbness or tingling in the extremities. No change in bowel or bladder control.  MUSCULOSKELETAL: No muscle, back pain, joint pain or stiffness.  LYMPHATICS: No enlarged nodes. No history of splenectomy.  PSYCHIATRIC:  No history of depression or anxiety.  ENDOCRINOLOGIC: No reports of sweating, cold or heat intolerance. No polyuria or polydipsia.  Marland Kitchen   Physical Examination Today's Vitals   02/15/21 0853  BP: 106/80  Pulse: 96  SpO2: 98%  Weight: 266 lb 9.6 oz (120.9 kg)  Height: _0  (1.6 m)   Body mass index is 47.23 kg/m.  Gen: resting comfortably, no acute distress HEENT: no scleral icterus, pupils equal round and reactive, no palptable cervical adenopathy,  CV: RRR, no m/r/g, no jvd Resp: Clear to auscultation bilaterally GI: abdomen is soft, non-tender, non-distended, normal bowel sounds, no hepatosplenomegaly MSK: extremities are warm, no edema.  Skin: warm, no rash Neuro:  no focal deficits Psych: appropriate affect   Diagnostic Studies 04/29/14 Echo  Study Conclusions  - Left ventricle: The cavity size was normal. Wall thickness was  increased in a pattern of mild LVH. Systolic function was normal. The estimated ejection fraction was in the range of 60% to 65%. Wall motion was normal; there were no regional wall motion abnormalities. Left ventricular diastolic function parameters were normal. - Mitral valve: There was trivial regurgitation.    11/2012 Echo [facility] LVEF 55-60%, no WMAs, no effusion    Assessment and Plan  1. Atypical chest pain - long history of left sided chest pain, has had a prior episode of pericarditis in the past - longterm intermittent symptoms have thought to be inflammatory, perhaps related to her Still's disease - current symptoms are similar to her past symptoms. Will trest with ibuprofen 866m bid x 7 days, check ESR and hsCRP. Check echo for any potential effusion. If more convincing signs of pericarditis would add colchicine. EKG without typical findings here, no rub on exam.        JArnoldo Lenis M.D.

## 2021-02-15 NOTE — Patient Instructions (Addendum)
Medication Instructions:  Begin Ibuprofen 872m twice a day x 7 days only.  Continue all other medications.    Labwork: ESR, high sensitivyty CRP - orders given today.   Testing/Procedures: Your physician has requested that you have an echocardiogram. Echocardiography is a painless test that uses sound waves to create images of your heart. It provides your doctor with information about the size and shape of your heart and how well your heart's chambers and valves are working. This procedure takes approximately one hour. There are no restrictions for this procedure.  Follow-Up: Office will contact with results via phone or letter.   6 weeks   Any Other Special Instructions Will Be Listed Below (If Applicable).  If you need a refill on your cardiac medications before your next appointment, please call your pharmacy.

## 2021-02-15 NOTE — Telephone Encounter (Signed)
Checking percert on the following patient for testing scheduled at [facility].    ECHO - 02/25/2021

## 2021-02-22 DIAGNOSIS — Z79899 Other long term (current) drug therapy: Secondary | ICD-10-CM | POA: Diagnosis not present

## 2021-02-22 DIAGNOSIS — F640 Transsexualism: Secondary | ICD-10-CM | POA: Diagnosis not present

## 2021-02-25 ENCOUNTER — Ambulatory Visit (HOSPITAL_COMMUNITY): Admission: RE | Admit: 2021-02-25 | Payer: Medicaid Other | Source: Ambulatory Visit

## 2021-03-02 DIAGNOSIS — F17210 Nicotine dependence, cigarettes, uncomplicated: Secondary | ICD-10-CM | POA: Diagnosis not present

## 2021-03-02 DIAGNOSIS — M25561 Pain in right knee: Secondary | ICD-10-CM | POA: Diagnosis not present

## 2021-03-02 DIAGNOSIS — M25551 Pain in right hip: Secondary | ICD-10-CM | POA: Diagnosis not present

## 2021-03-05 DIAGNOSIS — E669 Obesity, unspecified: Secondary | ICD-10-CM | POA: Diagnosis not present

## 2021-03-05 DIAGNOSIS — M0820 Juvenile rheumatoid arthritis with systemic onset, unspecified site: Secondary | ICD-10-CM | POA: Diagnosis not present

## 2021-03-05 DIAGNOSIS — I499 Cardiac arrhythmia, unspecified: Secondary | ICD-10-CM | POA: Diagnosis not present

## 2021-03-05 DIAGNOSIS — S134XXA Sprain of ligaments of cervical spine, initial encounter: Secondary | ICD-10-CM | POA: Diagnosis not present

## 2021-03-05 DIAGNOSIS — F319 Bipolar disorder, unspecified: Secondary | ICD-10-CM | POA: Diagnosis not present

## 2021-03-05 DIAGNOSIS — R079 Chest pain, unspecified: Secondary | ICD-10-CM | POA: Diagnosis not present

## 2021-03-05 DIAGNOSIS — Z743 Need for continuous supervision: Secondary | ICD-10-CM | POA: Diagnosis not present

## 2021-03-05 DIAGNOSIS — S299XXA Unspecified injury of thorax, initial encounter: Secondary | ICD-10-CM | POA: Diagnosis not present

## 2021-03-05 DIAGNOSIS — Z6841 Body Mass Index (BMI) 40.0 and over, adult: Secondary | ICD-10-CM | POA: Diagnosis not present

## 2021-03-05 DIAGNOSIS — F17290 Nicotine dependence, other tobacco product, uncomplicated: Secondary | ICD-10-CM | POA: Diagnosis not present

## 2021-03-05 DIAGNOSIS — R42 Dizziness and giddiness: Secondary | ICD-10-CM | POA: Diagnosis not present

## 2021-03-18 ENCOUNTER — Other Ambulatory Visit: Payer: Self-pay

## 2021-03-18 ENCOUNTER — Ambulatory Visit (HOSPITAL_COMMUNITY)
Admission: RE | Admit: 2021-03-18 | Discharge: 2021-03-18 | Disposition: A | Discharge status: Home / Self Care | Payer: Medicaid Other | Source: Ambulatory Visit | Attending: Cardiology | Admitting: Cardiology

## 2021-03-18 DIAGNOSIS — R079 Chest pain, unspecified: Secondary | ICD-10-CM | POA: Diagnosis not present

## 2021-03-18 LAB — ECHOCARDIOGRAM COMPLETE
Area-P 1/2: 2.69 cm2
S' Lateral: 2.6 cm

## 2021-03-18 NOTE — Progress Notes (Signed)
*  PRELIMINARY RESULTS* Echocardiogram 2D Echocardiogram has been performed.  Stacey Drain 03/18/2021, 11:11 AM

## 2021-03-28 NOTE — Progress Notes (Deleted)
Cardiology Office Note  Date: 03/28/2021   ID: [name], DOB 23-Feb-1991, MRN [id number]  PCP:  Rosalee Kaufman, PA-C  Cardiologist:  Carlyle Dolly, MD Electrophysiologist:  None   Chief Complaint: 6-week follow-up  History of Present Illness: [name] is a 30 y.o. female with a history of pericarditis, rheumatoid arthritis, stills disease, chest pain.  She had last been seen in Dr. Nelly Laurence office before recent visit back in 2015.  She had been having chest pain on and off for several years occurring once or twice a day with increasing frequency and severity over the prior year.  Described squeezing feeling left side 5 out of 10, with rest or with exertion.  She had been seen in the emergency room June 2015 with chest pain described as constant 8 out of 10 left-sided lasting 5 hours worse with position changes.  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  She has been treated with prednisone at the time.  She had a visit to the emergency room on January 31, 2021 with chest pain at [facility].  Troponins were negative x1.  Chest x-ray was negative.  EKG was sinus rhythm without ischemic changes.  Chest pain has been ongoing for few months.  Described as left sided upper chest sometimes occurring under left breast.  Described as tightness could occur at rest or with exertion.  Worse with deep breathing.  He has some associated shortness of breath 5/10 in severity.  Could last up to 6 to 7 hours.  Psychiatry had tried higher doses of Klonopin without benefit.  Previously had taken ibuprofen 800 mg daily.  Described as similar to prior chest pains.  Her Stills disease was followed by Dr. Deatra Canter at [facility].  Echocardiogram was ordered.  She was treated with 800 mg ibuprofen twice daily x7 days.  Plan is to check ESR and CRP.  He mentioned if more convincing signs of pericarditis would add colchicine.  Echocardiogram 03/18/2021 EF 55 to 60%.  No WMA's.   Normal diastolic function.  Trivial MR.  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     clonazePAM (KLONOPIN) 1 MG tablet Take 1 mg by mouth daily.     lithium carbonate 150 MG capsule Take 150 mg by mouth daily.     ondansetron (ZOFRAN) 8 MG tablet Take 8 mg by mouth every 8 (eight) hours as needed.     pantoprazole (PROTONIX) 40 MG tablet Take 1 tablet (40 mg total) by mouth daily. 30 tablet 0   testosterone cypionate (DEPOTESTOSTERONE CYPIONATE) 200 MG/ML injection Inject 0.25 mLs into the muscle once a week.     venlafaxine XR (EFFEXOR-XR) 150 MG 24 hr capsule Take 1 capsule by mouth daily at 6 (six) AM.     No current facility-administered medications for this visit.   Allergies:  Toradol [ketorolac tromethamine], Tramadol, and Strawberry extract   Social History: The patient  reports that she quit smoking  about 12 months ago. Her smoking use included cigarettes. She started smoking about 11 years ago. She has a 5.00 pack-year smoking history. She has never used smokeless tobacco. She reports that she does not currently use alcohol. She reports current drug use. Drug: Marijuana.   Family History: The patient's family history includes Depression in her father; Heart disease in her mother; Rheum arthritis in her father.   ROS:  Please see the history of present illness. Otherwise, complete review of systems is positive for none.  All other systems are reviewed and negative.   Physical Exam: VS:  There were no vitals taken for this visit., BMI There is no height  or weight on file to calculate BMI.  Wt Readings from Last 3 Encounters:  02/15/21 266 lb 9.6 oz (120.9 kg)  08/07/20 260 lb (117.9 kg)  11/21/14 230 lb (104.3 kg)    General: Patient appears comfortable at rest. HEENT: Conjunctiva and lids normal, oropharynx clear with moist mucosa. Neck: Supple, no elevated JVP or carotid bruits, no thyromegaly. Lungs: Clear to auscultation, nonlabored breathing at rest. Cardiac: Regular rate and rhythm, no S3 or significant systolic murmur, no pericardial rub. Abdomen: Soft, nontender, no hepatomegaly, bowel sounds present, no guarding or rebound. Extremities: No pitting edema, distal pulses 2+. Skin: Warm and dry. Musculoskeletal: No kyphosis. Neuropsychiatric: Alert and oriented x3, affect grossly appropriate.  ECG:  {EKG/Telemetry Strips Reviewed:[id number]}  Recent Labwork: 08/07/2020: BUN 9; Creatinine, Ser 0.99; Hemoglobin 13.2; Platelets 316; Potassium 3.7; Sodium 135     Component Value Date/Time   CHOL 200 05/03/2019 0654   TRIG 128 05/03/2019 0654   HDL 41 05/03/2019 0654   CHOLHDL 4.9 05/03/2019 0654   VLDL 26 05/03/2019 0654   LDLCALC 133 (H) 05/03/2019 0654    Other Studies Reviewed Today:  Echocardiogram 03/18/2021 Left ventricular ejection fraction, by estimation, is 55 to 60%. The left ventricle has normal function. The left ventricle has no regional wall motion abnormalities. Left ventricular diastolic parameters were normal.  2. Right ventricular systolic function is normal. The right ventricular size is normal. Tricuspid regurgitation signal is inadequate for assessing PA pressure. 3. The mitral valve is grossly normal. Trivial mitral valve regurgitation. 4. The aortic valve is tricuspid. Aortic valve regurgitation is not visualized. The inferior vena cava is normal in size with greater than 50% respiratory variability, suggesting right atrial pressure of 3 mmHg.  Diagnostic Studies 04/29/14 Echo  Study  Conclusions  - Left ventricle: The cavity size was normal. Wall thickness was increased in a pattern of mild LVH. Systolic function was normal. The estimated ejection fraction was in the range of 60% to 65%. Wall motion was normal; there were no regional wall motion abnormalities. Left ventricular diastolic function parameters were normal. - Mitral valve: There was trivial regurgitation.    11/2012 Echo [place] LVEF 55-60%, no WMAs, no effusion        Assessment and Plan:  1. Chest pain, unspecified type      Medication Adjustments/Labs and Tests Ordered: Current medicines are reviewed at length with the patient today.  Concerns regarding medicines are outlined above.   Disposition: Follow-up with ***  Signed, Levell July, NP 03/28/2021 12:44 PM    [facility] at [facility] [address], [place], [place] [address] Phone: [phone]; Fax: [phone]

## 2021-03-29 ENCOUNTER — Ambulatory Visit: Payer: Medicaid Other | Admitting: Family Medicine

## 2021-03-29 DIAGNOSIS — R079 Chest pain, unspecified: Secondary | ICD-10-CM

## 2021-03-31 ENCOUNTER — Ambulatory Visit: Payer: Medicaid Other | Admitting: Family Medicine

## 2021-04-07 NOTE — Progress Notes (Deleted)
Cardiology Office Note  Date: 04/07/2021   ID: [name], DOB Dec 06, 1990, MRN [id number]  PCP:  Kathryn Kaufman, PA-C  Cardiologist:  Kathryn Dolly, MD Electrophysiologist:  None   Chief Complaint: 6-week follow-up  History of Present Illness: [name] is a 30 y.o. female with a history of pericarditis, rheumatoid arthritis, stills disease, chest pain.  She was last seen by [name] on 02/15/2021.  She had been having chest pain on and off for several years occurring once or twice a day with increasing frequency and severity over the prior year.  Described squeezing feeling left side 5 out of 10, with rest or with exertion.  She had been seen in the emergency room June 2015 with chest pain described as constant 8 out of 10 left-sided lasting 5 hours worse with position changes.  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  She has been treated with prednisone at the time.  She had a visit to the emergency room on January 31, 2021 with chest pain at [facility].  Troponins were negative x1.  Chest x-ray was negative.  EKG was sinus rhythm without ischemic changes.  Chest pain has been ongoing for few months.  Described as left sided upper chest sometimes occurring under left breast.  Described as tightness could occur at rest or with exertion.  Worse with deep breathing. She had some associated shortness of breath 5/10 in severity.  Could last up to 6 to 7 hours.  Psychiatry had tried higher doses of Klonopin without benefit.  Previously had taken ibuprofen 800 mg daily.  Described as similar to prior chest pains.  Her Stills disease was followed by [name] at [facility].  Echocardiogram was ordered.  She was treated with 800 mg ibuprofen twice daily x7 days.  Plan was to check ESR and CRP.  He mentioned if more convincing signs of pericarditis would add colchicine.  Echocardiogram 03/18/2021 EF 55 to 60%.  No WMA's.  Normal diastolic function.  Trivial  MR.  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     clonazePAM (KLONOPIN) 1 MG tablet Take 1 mg by mouth daily.     lithium carbonate 150 MG capsule Take 150 mg by mouth daily.     ondansetron (ZOFRAN) 8 MG tablet Take 8 mg by mouth every 8 (eight) hours as needed.     pantoprazole (PROTONIX) 40 MG tablet Take 1 tablet (40 mg total) by mouth daily. 30 tablet 0   testosterone cypionate (DEPOTESTOSTERONE CYPIONATE) 200 MG/ML injection Inject 0.25 mLs into the muscle once a week.     venlafaxine XR (EFFEXOR-XR) 150 MG 24 hr capsule Take 1 capsule by mouth daily at 6 (six) AM.     No current facility-administered medications for this visit.   Allergies:  Toradol [ketorolac tromethamine], Tramadol, and Strawberry extract   Social History: The patient  reports that she quit smoking about 13 months ago. Her smoking use  included cigarettes. She started smoking about 11 years ago. She has a 5.00 pack-year smoking history. She has never used smokeless tobacco. She reports that she does not currently use alcohol. She reports current drug use. Drug: Marijuana.   Family History: The patient's family history includes Depression in her father; Heart disease in her mother; Rheum arthritis in her father.   ROS:  Please see the history of present illness. Otherwise, complete review of systems is positive for none.  All other systems are reviewed and negative.   Physical Exam: VS:  There were no vitals taken for this visit., BMI There is no height or weight on file to calculate  BMI.  Wt Readings from Last 3 Encounters:  02/15/21 266 lb 9.6 oz (120.9 kg)  08/07/20 260 lb (117.9 kg)  11/21/14 230 lb (104.3 kg)    General: Patient appears comfortable at rest. HEENT: Conjunctiva and lids normal, oropharynx clear with moist mucosa. Neck: Supple, no elevated JVP or carotid bruits, no thyromegaly. Lungs: Clear to auscultation, nonlabored breathing at rest. Cardiac: Regular rate and rhythm, no S3 or significant systolic murmur, no pericardial rub. Abdomen: Soft, nontender, no hepatomegaly, bowel sounds present, no guarding or rebound. Extremities: No pitting edema, distal pulses 2+. Skin: Warm and dry. Musculoskeletal: No kyphosis. Neuropsychiatric: Alert and oriented x3, affect grossly appropriate.  ECG:  {EKG/Telemetry Strips [id number]}  Recent Labwork: 08/07/2020: BUN 9; Creatinine, Ser 0.99; Hemoglobin 13.2; Platelets 316; Potassium 3.7; Sodium 135     Component Value Date/Time   CHOL 200 05/03/2019 0654   TRIG 128 05/03/2019 0654   HDL 41 05/03/2019 0654   CHOLHDL 4.9 05/03/2019 0654   VLDL 26 05/03/2019 0654   LDLCALC 133 (H) 05/03/2019 0654    Other Studies Reviewed Today:  Echocardiogram 03/18/2021 Left ventricular ejection fraction, by estimation, is 55 to 60%. The left ventricle has normal function. The left ventricle has no regional wall motion abnormalities. Left ventricular diastolic parameters were normal.  2. Right ventricular systolic function is normal. The right ventricular size is normal. Tricuspid regurgitation signal is inadequate for assessing PA pressure. 3. The mitral valve is grossly normal. Trivial mitral valve regurgitation. 4. The aortic valve is tricuspid. Aortic valve regurgitation is not visualized. The inferior vena cava is normal in size with greater than 50% respiratory variability, suggesting right atrial pressure of 3 mmHg.  Diagnostic Studies 04/29/14 Echo  Study Conclusions - Left ventricle: The cavity size  was normal. Wall thickness was increased in a pattern of mild LVH. Systolic function was normal. The estimated ejection fraction was in the range of 60% to 65%. Wall motion was normal; there were no regional wall motion abnormalities. Left ventricular diastolic function parameters were normal. - Mitral valve: There was trivial regurgitation.    11/2012 Echo [place] LVEF 55-60%, no WMAs, no effusion     Assessment and Plan:  1. Chest pain, unspecified type   2. History of pericarditis   3. SOB (shortness of breath)   4. 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[place])     1. Chest pain, unspecified type ***  2. History of pericarditis ***  3. SOB (shortness of breath) ***  4. 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H[facility]) ***   Medication Adjustments/Labs and Tests Ordered: Current medicines are reviewed at length with the patient today.  Concerns regarding medicines are outlined above.   Disposition: Follow-up with ***  Signed, Levell July, NP 04/07/2021 10:00 PM    [place] at [facility], [place], [place] [address]  Phone: [phone]; Fax: [phone]

## 2021-04-08 ENCOUNTER — Ambulatory Visit: Payer: Medicaid Other | Admitting: Family Medicine

## 2021-04-08 DIAGNOSIS — R0602 Shortness of breath: Secondary | ICD-10-CM

## 2021-04-08 DIAGNOSIS — M0820 Juvenile rheumatoid arthritis with systemic onset, unspecified site: Secondary | ICD-10-CM

## 2021-04-08 DIAGNOSIS — R079 Chest pain, unspecified: Secondary | ICD-10-CM

## 2021-04-08 DIAGNOSIS — Z8679 Personal history of other diseases of the circulatory system: Secondary | ICD-10-CM

## 2021-04-11 DIAGNOSIS — F640 Transsexualism: Secondary | ICD-10-CM | POA: Diagnosis not present

## 2021-04-26 ENCOUNTER — Encounter: Payer: Self-pay | Admitting: Cardiology

## 2021-04-26 ENCOUNTER — Ambulatory Visit (INDEPENDENT_AMBULATORY_CARE_PROVIDER_SITE_OTHER): Payer: Medicaid Other | Admitting: Cardiology

## 2021-04-26 VITALS — BP 120/82 | HR 104 | Ht 63.0 in | Wt 276.2 lb

## 2021-04-26 DIAGNOSIS — R079 Chest pain, unspecified: Secondary | ICD-10-CM | POA: Diagnosis not present

## 2021-04-26 NOTE — Progress Notes (Signed)
Clinical Summary Ms. Tregre is a 30 y.o.female seen today for follow up of the following medical problems.   1. Chest pain   From prior visit:  - on and off for several years. Occurs approx 1-2 times a day. Feels increased frequency and severity over the last year. Squeezing feeling left side, 5/10. Can occur at rest or with exertion. No other associated symptoms. Not positional. Pain lasts up to 10 minutes. Nothing makes pain better. - seen in ER 12/2013 with chest pain. Cosntant 8/10 left sided, lasted 5 hours, worst with position. 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 She reports a long hospitalization at [facility] in [place], [place]. She states she was treated with prednisone at the time for ongoing chest pain due to pericarditis and joint pain, pain somewhat improved with predinsone. - from notes had chest pain at that time, trop of 2.4, non-specific EKG changes but clinical symptoms. CT PE negative. +rub on exam. Symptoms improved with NSAIDs and steroids - can walk a mile on flatground, some DOE with incline that is stable. No LE edema.     Current visit:   ER visit 01/31/2021 with chest pain [facility] - trop neg x1. CXR no acute process. EKG report (tracing not available) SR, no ischemic changes   - chest pain onoing few months - left sided upper chest, sometimes under left breast. Tightness, can occur at rest or with exertion. Worst with deep breathing. Can have some associated SOB. 5/10 in severity. Pain can last up 6-7 hours - psych tried higher klonopin without benefit.  - has taken ibuprofen 843m once daily - similar to her prior chest pains.     -echo 02/2021 LVEF 55-60%, no WMAs, no effusion - dont see she had inflammatory markers checked - had course of NSAIDs   -  some ongoing chest pains at times, she thinks more related to anxiety. Better with klonopin - episodes 1-2 times per week. Can be positional, sometimes better with stretching.    2. Still's disease - [facility] reviewed from his last note 04/01/14 which appears to have been there first visit together. - hx of juvenile RA diagnosed age 30 previously on prednisone, methotrexate in the past. Due to weight gain she has been resistant to continued predisone.  [facility]      Past Medical History:  Diagnosis Date   Pericarditis    RA (rheumatoid arthritis) ([place]    Still disease, 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[place]    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[place]    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[place]    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[place]      Allergies  Allergen Reactions   Toradol [Ketorolac Tromethamine]    Tramadol Other (See Comments)    "Passed out for 4 hours, vomited uncontrollably"   Strawberry Extract Itching and Rash     Current Outpatient Medications  Medication Sig Dispense Refill   ARIPiprazole (ABILIFY) 10 MG tablet Take 10 mg by mouth daily.     clonazePAM (KLONOPIN) 1 MG tablet Take 1 mg by mouth daily.     lithium carbonate 150 MG capsule Take 150 mg by mouth daily.     ondansetron (ZOFRAN) 8 MG tablet Take 8 mg by mouth every 8 (eight) hours as needed.  pantoprazole (PROTONIX) 40 MG tablet Take 1 tablet (40 mg total) by mouth daily. 30 tablet 0   testosterone cypionate (DEPOTESTOSTERONE CYPIONATE) 200 MG/ML injection Inject 0.25 mLs into the muscle once a week.     venlafaxine XR (EFFEXOR-XR) 150 MG 24 hr capsule Take 1 capsule by mouth daily at 6 (six) AM.     No current facility-administered medications for this visit.     Past Surgical History:  Procedure Laterality Date   WISDOM TOOTH EXTRACTION       Allergies  Allergen Reactions   Toradol [Ketorolac Tromethamine]    Tramadol Other (See Comments)    "Passed out for 4 hours, vomited uncontrollably"    Strawberry Extract Itching and Rash      Family History  Problem Relation Age of Onset   Heart disease Mother    Depression Father    Rheum arthritis Father      Social History Ms. Lankford reports that she quit smoking about 13 months ago. Her smoking use included cigarettes. She started smoking about 11 years ago. She has a 5.00 pack-year smoking history. She has never used smokeless tobacco. Ms. Henson reports that she does not currently use alcohol.   Review of Systems CONSTITUTIONAL: No weight loss, fever, chills, weakness or fatigue.  HEENT: Eyes: No visual loss, blurred vision, double vision or yellow sclerae.No hearing loss, sneezing, congestion, runny nose or sore throat.  SKIN: No rash or itching.  CARDIOVASCULAR: per hpi RESPIRATORY: No shortness of breath, cough or sputum.  GASTROINTESTINAL: No anorexia, nausea, vomiting or diarrhea. No abdominal pain or blood.  GENITOURINARY: No burning on urination, no polyuria NEUROLOGICAL: No headache, dizziness, syncope, paralysis, ataxia, numbness or tingling in the extremities. No change in bowel or bladder control.  MUSCULOSKELETAL: No muscle, back pain, joint pain or stiffness.  LYMPHATICS: No enlarged nodes. No history of splenectomy.  PSYCHIATRIC: No history of depression or anxiety.  ENDOCRINOLOGIC: No reports of sweating, cold or heat intolerance. No polyuria or polydipsia.  Marland Kitchen   Physical Examination Today's Vitals   04/26/21 0920  BP: 120/82  Pulse: (!) 104  SpO2: 97%  Weight: 276 lb 3.2 oz (125.3 kg)  Height: 5' 3" (1.6 m)   Body mass index is 48.93 kg/m.  Gen: resting comfortably, no acute distress HEENT: no scleral icterus, pupils equal round and reactive, no palptable cervical adenopathy,  CV: RRR, no m/r/g no jvd Resp: Clear to auscultation bilaterally GI: abdomen is soft, non-tender, non-distended, normal bowel sounds, no hepatosplenomegaly MSK: extremities are warm, no edema.  Skin: warm, no rash Neuro:   no focal deficits Psych: appropriate affect   Diagnostic Studies 04/29/14 Echo  Study Conclusions  - Left ventricle: The cavity size was normal. Wall thickness was increased in a pattern of mild LVH. Systolic function was normal. The estimated ejection fraction was in the range of 60% to 65%. Wall motion was normal; there were no regional wall motion abnormalities. Left ventricular diastolic function parameters were normal. - Mitral valve: There was trivial regurgitation.    11/2012 Echo [place] LVEF 55-60%, no WMAs, no effusion    Assessment and Plan  1. Atypical chest pain - long history of left sided chest pain, has had a prior episode of pericarditis in the past - longterm intermittent symptoms have thought to be inflammatory, perhaps related to her Still's disease - recent benign echo without pericardial effusion, she did not go for ESR and CRP at last visit. She has noticed current symptoms often come  on with anxiety, better with klonopin - monitor at this time, if change in symptoms would reorder inflammatory markers. At this time does not appear to be recurrent pericaridits      Arnoldo Lenis, M.D.

## 2021-04-26 NOTE — Patient Instructions (Signed)
Medication Instructions:  Continue all current medications.  Labwork: none  Testing/Procedures: none  Follow-Up: 1 year   Any Other Special Instructions Will Be Listed Below (If Applicable).  If you need a refill on your cardiac medications before your next appointment, please call your pharmacy.  

## 2021-05-06 ENCOUNTER — Other Ambulatory Visit: Payer: Self-pay

## 2021-05-06 ENCOUNTER — Emergency Department (HOSPITAL_COMMUNITY)
Admission: EM | Admit: 2021-05-06 | Discharge: 2021-05-07 | Disposition: A | Discharge status: Home / Self Care | Payer: Medicaid Other | Attending: Emergency Medicine | Admitting: Emergency Medicine

## 2021-05-06 ENCOUNTER — Encounter (HOSPITAL_COMMUNITY): Payer: Self-pay

## 2021-05-06 DIAGNOSIS — F17210 Nicotine dependence, cigarettes, uncomplicated: Secondary | ICD-10-CM | POA: Insufficient documentation

## 2021-05-06 DIAGNOSIS — F410 Panic disorder [episodic paroxysmal anxiety] without agoraphobia: Secondary | ICD-10-CM | POA: Insufficient documentation

## 2021-05-06 HISTORY — DX: Panic disorder (episodic paroxysmal anxiety): F41.0

## 2021-05-06 MED ORDER — LORAZEPAM 1 MG PO TABS
1.0000 mg | ORAL_TABLET | Freq: Once | ORAL | Status: AC
  Administered 2021-05-06: 1 mg via SUBLINGUAL
  Filled 2021-05-06: qty 1

## 2021-05-06 NOTE — ED Provider Notes (Signed)
[facility] EMERGENCY DEPARTMENT Provider Note   CSN: [id number] Arrival date & time: 05/06/21  2231     History Chief Complaint  Patient presents with   Panic Attack    [name] is a 30 y.o. female.  Patient presents to the emergency department reporting that she has been having panic attacks intermittently throughout the day.  Patient reports that she does have a history of panic attacks.  She usually has to take Klonopin once a day.  She took it 3 or 4 hours ago and it did not help with her anxiety.  Patient indicates that she feels like she has a racing heartbeat and is "on edge".      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    Past Surgical History:  Procedure Laterality Date   WISDOM TOOTH EXTRACTION       OB History     Gravida  0   Para  0   Term  0   Preterm  0   AB  0   Living  0      SAB  0   IAB  0   Ectopic  0   Multiple  0   Live Births  0           Family History  Problem Relation Age of Onset   Heart disease Mother    Depression Father    Rheum arthritis Father     Social History   Tobacco Use   Smoking status: Every Day    Packs/day: 1.00    Years: 5.00    Pack years: 5.00    Types: Cigarettes, E-cigarettes    Start date: 07/27/2009   Smokeless tobacco: Never   Tobacco comments:    patient smoked her last cigarette 3 days ago 05/05/14 due to having a tooth pulled  Vaping Use   Vaping Use: Every day   Substances: Nicotine, Flavoring  Substance Use  Topics   Alcohol use: Not Currently   Drug use: Yes    Types: Marijuana    Home Medications Prior to Admission medications   Medication Sig Start Date End Date Taking? Authorizing Provider  ARIPiprazole (ABILIFY) 10 MG tablet Take 10 mg by mouth daily. 02/01/21   [provider]  clonazePAM (KLONOPIN) 1 MG tablet Take 1 mg by mouth daily. 07/28/20   [provider]  lithium carbonate 150 MG capsule Take 150 mg by mouth daily. 11/12/19   [provider]  ondansetron (ZOFRAN) 8 MG tablet Take 8 mg by mouth every 8 (eight) hours as needed. 06/21/20   [provider]  pantoprazole (PROTONIX) 40 MG tablet Take 1 tablet (40 mg total) by mouth daily. 05/06/19   Aldean Baker, NP  testosterone cypionate (DEPOTESTOSTERONE CYPIONATE) 200 MG/ML injection Inject 0.25 mLs into the muscle once a week. 01/01/21   [provider]  venlafaxine XR (EFFEXOR-XR) 150 MG 24 hr capsule Take 1 capsule by mouth daily  at 6 (six) AM. 12/10/20 12/10/21  [provider]    Allergies    Toradol [ketorolac tromethamine], Tramadol, and Strawberry extract  Review of Systems   Review of Systems  Cardiovascular:  Positive for palpitations.  Psychiatric/Behavioral:  The patient is nervous/anxious.   All other systems reviewed and are negative.  Physical Exam Updated Vital Signs BP 122/87 (BP Location: Left Arm)   Pulse (!) 113   Temp 98.3 F (36.8 C) (Oral)   Resp 18   Ht 5\' 3"  (1.6 m)   Wt 126 kg   LMP  (LMP Unknown) Comment: on testosterone  SpO2 99%   BMI 49.21 kg/m   Physical Exam Vitals and nursing note reviewed.  Constitutional:      General: She is not in acute distress.    Appearance: Normal appearance. She is well-developed.  HENT:     Head: Normocephalic and atraumatic.     Right Ear: Hearing normal.     Left Ear: Hearing normal.     Nose: Nose normal.  Eyes:     Conjunctiva/sclera: Conjunctivae normal.     Pupils: Pupils are equal, round, and  reactive to light.  Cardiovascular:     Rate and Rhythm: Regular rhythm.     Heart sounds: S1 normal and S2 normal. No murmur heard.   No friction rub. No gallop.  Pulmonary:     Effort: Pulmonary effort is normal. No respiratory distress.     Breath sounds: Normal breath sounds.  Chest:     Chest wall: No tenderness.  Abdominal:     General: Bowel sounds are normal.     Palpations: Abdomen is soft.     Tenderness: There is no abdominal tenderness. There is no guarding or rebound. Negative signs include Murphy's sign and McBurney's sign.     Hernia: No hernia is present.  Musculoskeletal:        General: Normal range of motion.     Cervical back: Normal range of motion and neck supple.  Skin:    General: Skin is warm and dry.     Findings: No rash.  Neurological:     Mental Status: She is alert and oriented to person, place, and time.     GCS: GCS eye subscore is 4. GCS verbal subscore is 5. GCS motor subscore is 6.     Cranial Nerves: No cranial nerve deficit.     Sensory: No sensory deficit.     Coordination: Coordination normal.  Psychiatric:        Mood and Affect: Mood is anxious.        Speech: Speech normal.        Behavior: Behavior normal.        Thought Content: Thought content normal.    ED Results / Procedures / Treatments   Labs (all labs ordered are listed, but only abnormal results are displayed) Labs Reviewed - No data to display  EKG None  Radiology No results found.  Procedures Procedures   Medications Ordered in ED Medications  LORazepam (ATIVAN) tablet 1 mg (1 mg Sublingual Given 05/06/21 2350)    ED Course  I have reviewed the triage vital signs and the nursing notes.  Pertinent labs & imaging results that were available during my care of the patient were reviewed by me and considered in my medical decision making (see chart for details).    MDM Rules/Calculators/A&P  Patient presents to the emergency  department for evaluation of increased anxiety and panic attack.  Patient reports that she had onset of panic attack symptoms earlier today.  She normally can ease her symptoms with her Klonopin.  She took her tablet 3 or 4 hours ago and did not have relief.  She is only prescribed 1 a day.  Patient given additional Ativan and has had significant improvement.  She was resting comfortably upon recheck.  Patient presents symptoms are resolved, will discharge, follow-up with primary care.  Final Clinical Impression(s) / ED Diagnoses Final diagnoses:  Panic attack    Rx / DC Orders ED Discharge Orders     None        Cherisse Carrell, Canary Brim, MD 05/07/21 0225

## 2021-05-06 NOTE — ED Triage Notes (Signed)
Pt arrived via POV c/o on-going Panic Attack sine 1300 today. Pt reports taking her medications about 3 hrs PTA but still feels 'On Edge'.

## 2021-05-20 DIAGNOSIS — M791 Myalgia, unspecified site: Secondary | ICD-10-CM | POA: Diagnosis not present

## 2021-05-20 DIAGNOSIS — Z20822 Contact with and (suspected) exposure to covid-19: Secondary | ICD-10-CM | POA: Diagnosis not present

## 2021-05-25 DIAGNOSIS — K828 Other specified diseases of gallbladder: Secondary | ICD-10-CM | POA: Diagnosis not present

## 2021-05-25 DIAGNOSIS — K219 Gastro-esophageal reflux disease without esophagitis: Secondary | ICD-10-CM | POA: Diagnosis not present

## 2021-05-25 DIAGNOSIS — F339 Major depressive disorder, recurrent, unspecified: Secondary | ICD-10-CM | POA: Diagnosis not present

## 2021-05-25 DIAGNOSIS — Z6841 Body Mass Index (BMI) 40.0 and over, adult: Secondary | ICD-10-CM | POA: Diagnosis not present

## 2021-05-25 DIAGNOSIS — F419 Anxiety disorder, unspecified: Secondary | ICD-10-CM | POA: Diagnosis not present

## 2021-06-06 DIAGNOSIS — K828 Other specified diseases of gallbladder: Secondary | ICD-10-CM | POA: Diagnosis not present

## 2021-06-17 DIAGNOSIS — Z20828 Contact with and (suspected) exposure to other viral communicable diseases: Secondary | ICD-10-CM | POA: Diagnosis not present

## 2021-06-17 DIAGNOSIS — J111 Influenza due to unidentified influenza virus with other respiratory manifestations: Secondary | ICD-10-CM | POA: Diagnosis not present

## 2021-06-17 DIAGNOSIS — R059 Cough, unspecified: Secondary | ICD-10-CM | POA: Diagnosis not present

## 2021-06-30 DIAGNOSIS — Z419 Encounter for procedure for purposes other than remedying health state, unspecified: Secondary | ICD-10-CM | POA: Diagnosis not present

## 2021-07-05 ENCOUNTER — Encounter (HOSPITAL_COMMUNITY): Payer: Medicaid Other

## 2021-07-08 DIAGNOSIS — Z79899 Other long term (current) drug therapy: Secondary | ICD-10-CM | POA: Diagnosis not present

## 2021-07-11 DIAGNOSIS — F640 Transsexualism: Secondary | ICD-10-CM | POA: Diagnosis not present

## 2021-07-18 DIAGNOSIS — R059 Cough, unspecified: Secondary | ICD-10-CM | POA: Diagnosis not present

## 2021-07-18 DIAGNOSIS — F17210 Nicotine dependence, cigarettes, uncomplicated: Secondary | ICD-10-CM | POA: Diagnosis not present

## 2021-07-18 DIAGNOSIS — Z20828 Contact with and (suspected) exposure to other viral communicable diseases: Secondary | ICD-10-CM | POA: Diagnosis not present

## 2021-07-20 DIAGNOSIS — F411 Generalized anxiety disorder: Secondary | ICD-10-CM | POA: Diagnosis not present

## 2021-07-31 DIAGNOSIS — Z419 Encounter for procedure for purposes other than remedying health state, unspecified: Secondary | ICD-10-CM | POA: Diagnosis not present

## 2021-08-05 DIAGNOSIS — F411 Generalized anxiety disorder: Secondary | ICD-10-CM | POA: Diagnosis not present

## 2021-08-08 NOTE — Progress Notes (Addendum)
Anesthesia Review:  PCP: Wayland Denis at [facility]  Called and requested positive covid test results from 06/2021 and ov note related.  [facility] they are to fax.  Cardiologist : DR Lia Hopping 04/26/21.   Rheumatology- DR Cathlean Marseilles LOV 05/24/21- has Still's disease  Chest x-ray :08/07/20- 2v  EKG :02/15/21  Echo :03/18/21  Stress test: Cardiac Cath :  Activity level: cannot do a flight of stairs without difficulty  Sleep Study/ CPAP : none  Fasting Blood Sugar :      / Checks Blood Sugar -- times a day:   Blood Thinner/ Instructions /Last Dose: ASA / Instructions/ Last Dose :   No covid test- ambulatory surgery.  PT forgot preop appt on 08/10/2021.  Appt completed via phone.  PT to come in for labs on 08/16/2021 for labs and to pick up soap and preop instructions.   PT positive for covid on

## 2021-08-08 NOTE — Progress Notes (Signed)
Sent message, via epic in basket, requesting orders in epic from surgeon.  

## 2021-08-08 NOTE — Progress Notes (Addendum)
[name]  08/08/2021   Your procedure is scheduled on:        08/29/2021.   Report to [facility] Main  Entrance   Report to admitting at   [phone]     Call this number if you have problems the morning of surgery [phone]    Remember: Do not eat food , candy gum or mints :After Midnight. You may have clear liquids from midnight until __ 0430am   CLEAR LIQUID DIET   Foods Allowed                                                                       Coffee and tea, regular and decaf                              Plain Jell-O any favor except red or purple                                            Fruit ices (not with fruit pulp)                                      Iced Popsicles                                     Carbonated beverages, regular and diet                                    Cranberry, grape and apple juices Sports drinks like Gatorade Lightly seasoned clear broth or consume(fat free) Sugar   _____________________________________________________________________    BRUSH YOUR TEETH MORNING OF SURGERY AND RINSE YOUR MOUTH OUT, NO CHEWING GUM CANDY OR MINTS.     Take these medicines the morning of surgery with A SIP OF WATER:  Abilify, effexor   DO NOT TAKE ANY DIABETIC MEDICATIONS DAY OF YOUR SURGERY                               You may not have any metal on your body including hair pins and              piercings  Do not wear jewelry, make-up, lotions, powders or perfumes, deodorant             Do not wear nail polish on your fingernails.  Do not shave  48 hours prior to surgery.              Men may shave face and neck.   Do not bring valuables to the hospital. [facility] IS NOT             RESPONSIBLE   FOR VALUABLES.  Contacts,  dentures or bridgework may not be worn into surgery.  Leave suitcase in the car. After surgery it may be brought to your room.     Patients discharged the day of surgery will not be allowed to  drive home. IF YOU ARE HAVING SURGERY AND GOING HOME THE SAME DAY, YOU MUST HAVE AN ADULT TO DRIVE YOU HOME AND BE WITH YOU FOR 24 HOURS. YOU MAY GO HOME BY TAXI OR UBER OR ORTHERWISE, BUT AN ADULT MUST ACCOMPANY YOU HOME AND STAY WITH YOU FOR 24 HOURS.  Name and phone number of your driver:  Special Instructions: N/A              Please read over the following fact sheets you were given: _____________________________________________________________________  [facility] - Preparing for Surgery Before surgery, you can play an important role.  Because skin is not sterile, your skin needs to be as free of germs as possible.  You can reduce the number of germs on your skin by washing with CHG (chlorahexidine gluconate) soap before surgery.  CHG is an antiseptic cleaner which kills germs and bonds with the skin to continue killing germs even after washing. Please DO NOT use if you have an allergy to CHG or antibacterial soaps.  If your skin becomes reddened/irritated stop using the CHG and inform your nurse when you arrive at Short Stay. Do not shave (including legs and underarms) for at least 48 hours prior to the first CHG shower.  You may shave your face/neck. Please follow these instructions carefully:  1.  Shower with CHG Soap the night before surgery and the  morning of Surgery.  2.  If you choose to wash your hair, wash your hair first as usual with your  normal  shampoo.  3.  After you shampoo, rinse your hair and body thoroughly to remove the  shampoo.                           4.  Use CHG as you would any other liquid soap.  You can apply chg directly  to the skin and wash                       Gently with a scrungie or clean washcloth.  5.  Apply the CHG Soap to your body ONLY FROM THE NECK DOWN.   Do not use on face/ open                           Wound or open sores. Avoid contact with eyes, ears mouth and genitals (private parts).                       Wash face,  Genitals (private parts)  with your normal soap.             6.  Wash thoroughly, paying special attention to the area where your surgery  will be performed.  7.  Thoroughly rinse your body with warm water from the neck down.  8.  DO NOT shower/wash with your normal soap after using and rinsing off  the CHG Soap.                9.  Pat yourself dry with a clean towel.            10.  Wear clean pajamas.  11.  Place clean sheets on your bed the night of your first shower and do not  sleep with pets. Day of Surgery : Do not apply any lotions/deodorants the morning of surgery.  Please wear clean clothes to the hospital/surgery center.  FAILURE TO FOLLOW THESE INSTRUCTIONS MAY RESULT IN THE CANCELLATION OF YOUR SURGERY PATIENT SIGNATURE_________________________________  NURSE SIGNATURE__________________________________  ________________________________________________________________________

## 2021-08-09 ENCOUNTER — Ambulatory Visit: Payer: Self-pay | Admitting: Surgery

## 2021-08-09 DIAGNOSIS — Z01818 Encounter for other preprocedural examination: Secondary | ICD-10-CM

## 2021-08-10 ENCOUNTER — Inpatient Hospital Stay (HOSPITAL_COMMUNITY)
Admission: RE | Admit: 2021-08-10 | Discharge: 2021-08-10 | Disposition: A | Discharge status: Home / Self Care | Payer: Medicaid Other | Source: Ambulatory Visit

## 2021-08-10 ENCOUNTER — Other Ambulatory Visit: Payer: Self-pay

## 2021-08-10 ENCOUNTER — Encounter (HOSPITAL_COMMUNITY): Payer: Self-pay | Admitting: Surgery

## 2021-08-10 DIAGNOSIS — Z01818 Encounter for other preprocedural examination: Secondary | ICD-10-CM

## 2021-08-16 ENCOUNTER — Encounter (HOSPITAL_COMMUNITY)
Admission: RE | Admit: 2021-08-16 | Discharge: 2021-08-16 | Disposition: A | Discharge status: Home / Self Care | Payer: Medicaid Other | Source: Ambulatory Visit | Attending: Physician Assistant | Admitting: Physician Assistant

## 2021-08-16 NOTE — Progress Notes (Signed)
PT called back this pm and stated she had migraine this am and took meds this am for migraine.  Lab appt was rescheduled for 08/18/21 at 1130am.

## 2021-08-16 NOTE — Progress Notes (Signed)
On 08/16/2021 pt did not arrive for lab appt.  Called x 2 and [facility] for pt to call RN.  Went out front to both waiting rooms and called Admitting.  PT did not arrive.

## 2021-08-18 ENCOUNTER — Other Ambulatory Visit: Payer: Self-pay

## 2021-08-18 ENCOUNTER — Encounter (HOSPITAL_COMMUNITY)
Admission: RE | Admit: 2021-08-18 | Discharge: 2021-08-18 | Disposition: A | Discharge status: Home / Self Care | Payer: Medicaid Other | Source: Ambulatory Visit | Attending: Surgery | Admitting: Surgery

## 2021-08-18 DIAGNOSIS — K219 Gastro-esophageal reflux disease without esophagitis: Secondary | ICD-10-CM | POA: Diagnosis not present

## 2021-08-18 DIAGNOSIS — I319 Disease of pericardium, unspecified: Secondary | ICD-10-CM | POA: Diagnosis not present

## 2021-08-18 DIAGNOSIS — Z01818 Encounter for other preprocedural examination: Secondary | ICD-10-CM

## 2021-08-18 DIAGNOSIS — K828 Other specified diseases of gallbladder: Secondary | ICD-10-CM | POA: Insufficient documentation

## 2021-08-18 DIAGNOSIS — R0789 Other chest pain: Secondary | ICD-10-CM | POA: Diagnosis not present

## 2021-08-18 DIAGNOSIS — M061 Adult-onset Still's disease: Secondary | ICD-10-CM | POA: Insufficient documentation

## 2021-08-18 DIAGNOSIS — Z87891 Personal history of nicotine dependence: Secondary | ICD-10-CM | POA: Insufficient documentation

## 2021-08-18 DIAGNOSIS — Z01812 Encounter for preprocedural laboratory examination: Secondary | ICD-10-CM | POA: Diagnosis not present

## 2021-08-18 LAB — TYPE AND SCREEN
ABO/RH(D): A POS
Antibody Screen: NEGATIVE

## 2021-08-18 LAB — CBC WITH DIFFERENTIAL/PLATELET
Abs Immature Granulocytes: 0.04 10*3/uL (ref 0.00–0.07)
Basophils Absolute: 0 10*3/uL (ref 0.0–0.1)
Basophils Relative: 0 %
Eosinophils Absolute: 0.4 10*3/uL (ref 0.0–0.5)
Eosinophils Relative: 4 %
HCT: 44.8 % (ref 36.0–46.0)
Hemoglobin: 14.4 g/dL (ref 12.0–15.0)
Immature Granulocytes: 0 %
Lymphocytes Relative: 24 %
Lymphs Abs: 2.7 10*3/uL (ref 0.7–4.0)
MCH: 28.8 pg (ref 26.0–34.0)
MCHC: 32.1 g/dL (ref 30.0–36.0)
MCV: 89.6 fL (ref 80.0–100.0)
Monocytes Absolute: 0.6 10*3/uL (ref 0.1–1.0)
Monocytes Relative: 5 %
Neutro Abs: 7.6 10*3/uL (ref 1.7–7.7)
Neutrophils Relative %: 67 %
Platelets: 329 10*3/uL (ref 150–400)
RBC: 5 MIL/uL (ref 3.87–5.11)
RDW: 15.2 % (ref 11.5–15.5)
WBC: 11.3 10*3/uL — ABNORMAL HIGH (ref 4.0–10.5)
nRBC: 0 % (ref 0.0–0.2)

## 2021-08-18 LAB — COMPREHENSIVE METABOLIC PANEL
ALT: 37 U/L (ref 0–44)
AST: 28 U/L (ref 15–41)
Albumin: 4 g/dL (ref 3.5–5.0)
Alkaline Phosphatase: 59 U/L (ref 38–126)
Anion gap: 20 — ABNORMAL HIGH (ref 5–15)
BUN: 7 mg/dL (ref 6–20)
CO2: 22 mmol/L (ref 22–32)
Calcium: 8.9 mg/dL (ref 8.9–10.3)
Chloride: 96 mmol/L — ABNORMAL LOW (ref 98–111)
Creatinine, Ser: 0.87 mg/dL (ref 0.44–1.00)
GFR, Estimated: >60 mL/min (ref >60)
Glucose, Bld: 101 mg/dL — ABNORMAL HIGH (ref 70–99)
Potassium: 3.8 mmol/L (ref 3.5–5.1)
Sodium: 138 mmol/L (ref 135–145)
Total Bilirubin: 0.8 mg/dL (ref 0.3–1.2)
Total Protein: 7.8 g/dL (ref 6.5–8.1)

## 2021-08-19 DIAGNOSIS — K828 Other specified diseases of gallbladder: Secondary | ICD-10-CM | POA: Diagnosis not present

## 2021-08-19 DIAGNOSIS — M0800 Unspecified juvenile rheumatoid arthritis of unspecified site: Secondary | ICD-10-CM | POA: Diagnosis not present

## 2021-08-19 DIAGNOSIS — F419 Anxiety disorder, unspecified: Secondary | ICD-10-CM | POA: Diagnosis not present

## 2021-08-19 DIAGNOSIS — F339 Major depressive disorder, recurrent, unspecified: Secondary | ICD-10-CM | POA: Diagnosis not present

## 2021-08-19 DIAGNOSIS — K219 Gastro-esophageal reflux disease without esophagitis: Secondary | ICD-10-CM | POA: Diagnosis not present

## 2021-08-19 DIAGNOSIS — Z6841 Body Mass Index (BMI) 40.0 and over, adult: Secondary | ICD-10-CM | POA: Diagnosis not present

## 2021-08-19 DIAGNOSIS — F320 Major depressive disorder, single episode, mild: Secondary | ICD-10-CM | POA: Diagnosis not present

## 2021-08-19 DIAGNOSIS — M545 Low back pain, unspecified: Secondary | ICD-10-CM | POA: Diagnosis not present

## 2021-08-19 DIAGNOSIS — G4700 Insomnia, unspecified: Secondary | ICD-10-CM | POA: Diagnosis not present

## 2021-08-19 NOTE — Progress Notes (Signed)
Anesthesia Chart Review   Case: [id number] Date/Time: 08/29/21 0715   Procedure: LAPAROSCOPIC CHOLECYSTECTOMY WITH ICG   Anesthesia type: General   Pre-op diagnosis: biliary dyskinesia   Location: [facility] ROOM 04 / [facility]   Surgeons: Ileana Roup, MD       DISCUSSION:30 y.o. 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 Nadeen Landau.   Pt last seen by cardiology 04/26/2021.  Pt with long history of atypical chest pain. Pt feels some episodes associated with anxiety, can be positional. No further cardiac testing ordered at this visit. Normal Echo 02/2021.   Anticipate pt can proceed with planned procedure barring acute status change.   VS: BP 123/84    Pulse 97    Temp 37.1 C (Oral)    Resp 16    Ht 5\' 3"  (1.6 m)    Wt 125.6 kg    SpO2 97%    BMI 49.07 kg/m   PROVIDERS: Rosalee Kaufman, PA-C is PCP   Carlyle Dolly, MD is Cardiologist  LABS: Labs reviewed: Acceptable for surgery. (all labs ordered are listed, but only abnormal results are displayed)  Labs Reviewed  CBC WITH DIFFERENTIAL/PLATELET - Abnormal; Notable for the following components:      Result Value   WBC 11.3 (*)    All other components within normal limits  COMPREHENSIVE METABOLIC PANEL - Abnormal; Notable for the following components:   Chloride 96 (*)    Glucose, Bld 101 (*)    Anion gap 20 (*)    All other components within normal limits  TYPE AND SCREEN     IMAGES:   EKG: 02/15/2021 Rate 95 bpm  NSR  CV: Echo 03/18/2021 1. Left ventricular ejection fraction, by estimation, is 55 to 60%. The  left ventricle has normal function. The left ventricle has no regional  wall motion abnormalities. Left ventricular diastolic parameters were  normal.   2. Right ventricular systolic function is normal. The right ventricular  size is normal. Tricuspid regurgitation signal is  inadequate for assessing  PA pressure.   3. The mitral valve is grossly normal. Trivial mitral valve  regurgitation.   4. The aortic valve is tricuspid. Aortic valve regurgitation is not  visualized.   5. The inferior vena cava is normal in size with greater than 50%  respiratory variability, suggesting right atrial pressure of 3 mmHg.  Past Medical History:  Diagnosis Date   Anxiety attack    panic attacks   Depression    GERD (gastroesophageal reflux disease)    Headache    Myocardial infarction [facility])    age 48   Pericarditis    RA (rheumatoid arthritis) (HCC)    Still disease, 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[place])    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[place])    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[place])    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[place])     Past Surgical History:  Procedure Laterality Date   WISDOM TOOTH EXTRACTION      MEDICATIONS:  ARIPiprazole (ABILIFY) 10 MG tablet   calcium carbonate (TUMS - DOSED IN MG ELEMENTAL CALCIUM) 500 MG chewable tablet   diphenhydrAMINE (BENADRYL) 25 MG tablet   ibuprofen (ADVIL) 200 MG tablet   lithium carbonate 150 MG capsule   ondansetron (ZOFRAN-ODT) 4 MG disintegrating tablet   testosterone cypionate (DEPOTESTOSTERONE CYPIONATE) 200 MG/ML injection   venlafaxine XR (EFFEXOR-XR) 150 MG 24 hr capsule   No current facility-administered medications for this encounter.  Konrad Felix Deeney, PA-C [facility]-Surgical Testing [phone]

## 2021-08-27 NOTE — Anesthesia Preprocedure Evaluation (Addendum)
Anesthesia Evaluation  Patient identified by MRN, date of birth, ID band Patient awake    Reviewed: Allergy & Precautions, NPO status , Patient's Chart, lab work & pertinent test results  Airway Mallampati: III  TM Distance: >3 FB Neck ROM: Full    Dental no notable dental hx. (+) Teeth Intact, Dental Advisory Given,    Pulmonary Current SmokerPatient did not abstain from smoking., former smoker,    Pulmonary exam normal breath sounds clear to auscultation       Cardiovascular + Past MI (at age 31)  Normal cardiovascular exam Rhythm:Regular Rate:Normal  Echo 03/18/2021 1. Left ventricular ejection fraction, by estimation, is 55 to 60%. The  left ventricle has normal function. The left ventricle has no regional  wall motion abnormalities. Left ventricular diastolic parameters were  normal.  2. Right ventricular systolic function is normal. The right ventricular    Hx of pericarditis size is normal. Tricuspid regurgitation signal is inadequate for assessing  PA pressure.    Neuro/Psych  Headaches, PSYCHIATRIC DISORDERS Anxiety Depression    GI/Hepatic Neg liver ROS, GERD  Medicated and Controlled,  Endo/Other  Morbid obesity (BMI 49)  Renal/GU Lab Results      Component                Value               Date                      CREATININE               0.87                08/18/2021                  K                        3.8                 08/18/2021                     Musculoskeletal  (+) Arthritis , Rheumatoid disorders,    Abdominal (+) + obese,   Peds  Hematology Lab Results      Component                Value               Date                      HGB                      14.4                08/18/2021                HCT                      44.8                08/18/2021                PLT                      329                 08/18/2021  Anesthesia Other Findings ALL : KETOROLAC,  TRAMADOL  Stills DZ  Reproductive/Obstetrics                            Anesthesia Physical Anesthesia Plan  ASA: 3  Anesthesia Plan: General   Post-op Pain Management:    Induction: Intravenous  PONV Risk Score and Plan: 4 or greater and Midazolam, Ondansetron, Dexamethasone and Treatment may vary due to age or medical condition  Airway Management Planned: Oral ETT  Additional Equipment: None  Intra-op Plan:   Post-operative Plan: Extubation in OR  Informed Consent: I have reviewed the patients History and Physical, chart, labs and discussed the procedure including the risks, benefits and alternatives for the proposed anesthesia with the patient or authorized representative who has indicated his/her understanding and acceptance.     Dental advisory given  Plan Discussed with: CRNA and Anesthesiologist  Anesthesia Plan Comments:        Anesthesia Quick Evaluation

## 2021-08-29 ENCOUNTER — Other Ambulatory Visit: Payer: Self-pay

## 2021-08-29 ENCOUNTER — Encounter (HOSPITAL_COMMUNITY)
Admission: RE | Disposition: A | Discharge status: Home / Self Care | Payer: Self-pay | Source: Home / Self Care | Attending: Surgery

## 2021-08-29 ENCOUNTER — Ambulatory Visit (HOSPITAL_COMMUNITY): Payer: Medicaid Other | Admitting: Anesthesiology

## 2021-08-29 ENCOUNTER — Ambulatory Visit (HOSPITAL_COMMUNITY)
Admission: RE | Admit: 2021-08-29 | Discharge: 2021-08-29 | Disposition: A | Discharge status: Home / Self Care | Payer: Medicaid Other | Attending: Surgery | Admitting: Surgery

## 2021-08-29 ENCOUNTER — Ambulatory Visit (HOSPITAL_COMMUNITY): Payer: Medicaid Other | Admitting: Physician Assistant

## 2021-08-29 ENCOUNTER — Encounter (HOSPITAL_COMMUNITY): Payer: Self-pay | Admitting: Surgery

## 2021-08-29 DIAGNOSIS — K811 Chronic cholecystitis: Secondary | ICD-10-CM | POA: Diagnosis not present

## 2021-08-29 DIAGNOSIS — K660 Peritoneal adhesions (postprocedural) (postinfection): Secondary | ICD-10-CM | POA: Diagnosis not present

## 2021-08-29 DIAGNOSIS — K219 Gastro-esophageal reflux disease without esophagitis: Secondary | ICD-10-CM | POA: Insufficient documentation

## 2021-08-29 DIAGNOSIS — M0820 Juvenile rheumatoid arthritis with systemic onset, unspecified site: Secondary | ICD-10-CM | POA: Insufficient documentation

## 2021-08-29 DIAGNOSIS — Z01818 Encounter for other preprocedural examination: Secondary | ICD-10-CM

## 2021-08-29 DIAGNOSIS — I252 Old myocardial infarction: Secondary | ICD-10-CM | POA: Insufficient documentation

## 2021-08-29 DIAGNOSIS — Z6841 Body Mass Index (BMI) 40.0 and over, adult: Secondary | ICD-10-CM | POA: Insufficient documentation

## 2021-08-29 DIAGNOSIS — K828 Other specified diseases of gallbladder: Secondary | ICD-10-CM | POA: Diagnosis not present

## 2021-08-29 HISTORY — DX: Depression, unspecified: F32.A

## 2021-08-29 HISTORY — DX: Gastro-esophageal reflux disease without esophagitis: K21.9

## 2021-08-29 HISTORY — DX: Headache, unspecified: R51.9

## 2021-08-29 HISTORY — PX: CHOLECYSTECTOMY: SHX55

## 2021-08-29 HISTORY — DX: Acute myocardial infarction, unspecified: I21.9

## 2021-08-29 LAB — CBC
HCT: 39.9 % (ref 36.0–46.0)
Hemoglobin: 12.6 g/dL (ref 12.0–15.0)
MCH: 29 pg (ref 26.0–34.0)
MCHC: 31.6 g/dL (ref 30.0–36.0)
MCV: 91.7 fL (ref 80.0–100.0)
Platelets: 246 10*3/uL (ref 150–400)
RBC: 4.35 MIL/uL (ref 3.87–5.11)
RDW: 14.8 % (ref 11.5–15.5)
WBC: 13 10*3/uL — ABNORMAL HIGH (ref 4.0–10.5)
nRBC: 0 % (ref 0.0–0.2)

## 2021-08-29 LAB — PREGNANCY, URINE: Preg Test, Ur: NEGATIVE

## 2021-08-29 LAB — ABO/RH: ABO/RH(D): A POS

## 2021-08-29 SURGERY — LAPAROSCOPIC CHOLECYSTECTOMY WITH INTRAOPERATIVE CHOLANGIOGRAM
Anesthesia: General

## 2021-08-29 MED ORDER — PHENYLEPHRINE HCL (PRESSORS) 10 MG/ML IV SOLN
INTRAVENOUS | Status: DC | PRN
Start: 2021-08-29 — End: 2021-08-29
  Administered 2021-08-29: 80 ug via INTRAVENOUS

## 2021-08-29 MED ORDER — SUCCINYLCHOLINE CHLORIDE 200 MG/10ML IV SOSY
PREFILLED_SYRINGE | INTRAVENOUS | Status: DC | PRN
  Administered 2021-08-29: 200 mg via INTRAVENOUS

## 2021-08-29 MED ORDER — CHLORHEXIDINE GLUCONATE CLOTH 2 % EX PADS: 6.0000 | MEDICATED_PAD | Freq: Once | CUTANEOUS | Status: DC

## 2021-08-29 MED ORDER — INDIGOTINDISULFONATE SODIUM 8 MG/ML IJ SOLN: INTRAMUSCULAR | Status: DC | PRN

## 2021-08-29 MED ORDER — ACETAMINOPHEN 500 MG PO TABS
1000.0000 mg | ORAL_TABLET | ORAL | Status: AC
  Administered 2021-08-29: 1000 mg via ORAL
  Filled 2021-08-29: qty 2

## 2021-08-29 MED ORDER — FENTANYL CITRATE (PF) 250 MCG/5ML IJ SOLN
INTRAMUSCULAR | Status: AC
  Filled 2021-08-29: qty 5

## 2021-08-29 MED ORDER — SUGAMMADEX SODIUM 200 MG/2ML IV SOLN
INTRAVENOUS | Status: DC | PRN
Start: 2021-08-29 — End: 2021-08-29
  Administered 2021-08-29: 300 mg via INTRAVENOUS

## 2021-08-29 MED ORDER — LACTATED RINGERS IV SOLN: INTRAVENOUS | Status: DC

## 2021-08-29 MED ORDER — SODIUM CHLORIDE 0.9 % IR SOLN
Status: DC | PRN
  Administered 2021-08-29: 1000 mL

## 2021-08-29 MED ORDER — PROPOFOL 10 MG/ML IV BOLUS
INTRAVENOUS | Status: DC | PRN
  Administered 2021-08-29: 200 mg via INTRAVENOUS

## 2021-08-29 MED ORDER — DEXAMETHASONE SODIUM PHOSPHATE 10 MG/ML IJ SOLN
INTRAMUSCULAR | Status: AC
  Filled 2021-08-29: qty 1

## 2021-08-29 MED ORDER — ROCURONIUM BROMIDE 10 MG/ML (PF) SYRINGE
PREFILLED_SYRINGE | INTRAVENOUS | Status: AC
  Filled 2021-08-29: qty 10

## 2021-08-29 MED ORDER — HYDROMORPHONE HCL 1 MG/ML IJ SOLN
0.2500 mg | INTRAMUSCULAR | Status: DC | PRN
  Administered 2021-08-29 (×4): 0.5 mg via INTRAVENOUS

## 2021-08-29 MED ORDER — DEXMEDETOMIDINE (PRECEDEX) IN NS 20 MCG/5ML (4 MCG/ML) IV SYRINGE
PREFILLED_SYRINGE | INTRAVENOUS | Status: DC | PRN
  Administered 2021-08-29: 10 ug via INTRAVENOUS

## 2021-08-29 MED ORDER — OXYCODONE HCL 5 MG PO TABS
ORAL_TABLET | ORAL | Status: AC
  Filled 2021-08-29: qty 1

## 2021-08-29 MED ORDER — BUPIVACAINE-EPINEPHRINE (PF) 0.25% -1:200000 IJ SOLN
INTRAMUSCULAR | Status: AC
  Filled 2021-08-29: qty 30

## 2021-08-29 MED ORDER — CEFAZOLIN IN SODIUM CHLORIDE 3-0.9 GM/100ML-% IV SOLN
3.0000 g | INTRAVENOUS | Status: AC
  Administered 2021-08-29: 3 g via INTRAVENOUS
  Filled 2021-08-29: qty 100

## 2021-08-29 MED ORDER — OXYCODONE HCL 5 MG PO TABS: 5.0000 mg | ORAL_TABLET | Freq: Four times a day (QID) | ORAL | 0 refills | Status: AC | PRN

## 2021-08-29 MED ORDER — MIDAZOLAM HCL 2 MG/2ML IJ SOLN
INTRAMUSCULAR | Status: AC
  Filled 2021-08-29: qty 2

## 2021-08-29 MED ORDER — BUPIVACAINE-EPINEPHRINE 0.25% -1:200000 IJ SOLN
INTRAMUSCULAR | Status: DC | PRN
  Administered 2021-08-29: 30 mL

## 2021-08-29 MED ORDER — PROPOFOL 10 MG/ML IV BOLUS
INTRAVENOUS | Status: AC
  Filled 2021-08-29: qty 20

## 2021-08-29 MED ORDER — ROCURONIUM BROMIDE 10 MG/ML (PF) SYRINGE
PREFILLED_SYRINGE | INTRAVENOUS | Status: DC | PRN
  Administered 2021-08-29: 10 mg via INTRAVENOUS
  Administered 2021-08-29: 60 mg via INTRAVENOUS

## 2021-08-29 MED ORDER — FENTANYL CITRATE (PF) 100 MCG/2ML IJ SOLN
INTRAMUSCULAR | Status: DC | PRN
Start: 2021-08-29 — End: 2021-08-29
  Administered 2021-08-29 (×2): 50 ug via INTRAVENOUS
  Administered 2021-08-29 (×2): 25 ug via INTRAVENOUS
  Administered 2021-08-29 (×2): 50 ug via INTRAVENOUS

## 2021-08-29 MED ORDER — ORAL CARE MOUTH RINSE: 15.0000 mL | Freq: Once | OROMUCOSAL | Status: AC

## 2021-08-29 MED ORDER — ONDANSETRON HCL 4 MG/2ML IJ SOLN: 4.0000 mg | Freq: Once | INTRAMUSCULAR | Status: DC | PRN

## 2021-08-29 MED ORDER — ACETAMINOPHEN 10 MG/ML IV SOLN
1000.0000 mg | Freq: Once | INTRAVENOUS | Status: DC | PRN
  Administered 2021-08-29: 1000 mg via INTRAVENOUS

## 2021-08-29 MED ORDER — INDOCYANINE GREEN 25 MG IV SOLR
7.5000 mg | Freq: Once | INTRAVENOUS | Status: AC
  Administered 2021-08-29: 2 mg via INTRAVENOUS
  Filled 2021-08-29: qty 10

## 2021-08-29 MED ORDER — ONDANSETRON HCL 4 MG/2ML IJ SOLN
INTRAMUSCULAR | Status: AC
  Filled 2021-08-29: qty 2

## 2021-08-29 MED ORDER — HYDROMORPHONE HCL 1 MG/ML IJ SOLN
INTRAMUSCULAR | Status: AC
  Filled 2021-08-29: qty 2

## 2021-08-29 MED ORDER — LIDOCAINE 2% (20 MG/ML) 5 ML SYRINGE
INTRAMUSCULAR | Status: DC | PRN
  Administered 2021-08-29: 60 mg via INTRAVENOUS

## 2021-08-29 MED ORDER — OXYCODONE HCL 5 MG PO TABS
5.0000 mg | ORAL_TABLET | Freq: Once | ORAL | Status: AC | PRN
  Administered 2021-08-29: 5 mg via ORAL

## 2021-08-29 MED ORDER — ACETAMINOPHEN 10 MG/ML IV SOLN
INTRAVENOUS | Status: AC
  Filled 2021-08-29: qty 100

## 2021-08-29 MED ORDER — ONDANSETRON HCL 4 MG/2ML IJ SOLN
INTRAMUSCULAR | Status: DC | PRN
  Administered 2021-08-29: 4 mg via INTRAVENOUS

## 2021-08-29 MED ORDER — CHLORHEXIDINE GLUCONATE 0.12 % MT SOLN
15.0000 mL | Freq: Once | OROMUCOSAL | Status: AC
  Administered 2021-08-29: 15 mL via OROMUCOSAL

## 2021-08-29 MED ORDER — SUCCINYLCHOLINE CHLORIDE 200 MG/10ML IV SOSY
PREFILLED_SYRINGE | INTRAVENOUS | Status: AC
  Filled 2021-08-29: qty 10

## 2021-08-29 MED ORDER — DEXAMETHASONE SODIUM PHOSPHATE 10 MG/ML IJ SOLN
INTRAMUSCULAR | Status: DC | PRN
  Administered 2021-08-29: 10 mg via INTRAVENOUS

## 2021-08-29 MED ORDER — MIDAZOLAM HCL 5 MG/5ML IJ SOLN
INTRAMUSCULAR | Status: DC | PRN
Start: 2021-08-29 — End: 2021-08-29
  Administered 2021-08-29: 2 mg via INTRAVENOUS

## 2021-08-29 MED ORDER — OXYCODONE HCL 5 MG/5ML PO SOLN: 5.0000 mg | Freq: Once | ORAL | Status: AC | PRN

## 2021-08-29 SURGICAL SUPPLY — 29 items
APPLIER CLIP 5 13 M/L LIGAMAX5 (MISCELLANEOUS) ×2
CABLE HIGH FREQUENCY MONO STRZ (ELECTRODE) ×2 IMPLANT
CHLORAPREP W/TINT 26 (MISCELLANEOUS) ×2 IMPLANT
CLIP APPLIE 5 13 M/L LIGAMAX5 (MISCELLANEOUS) ×1 IMPLANT
CLIP LIGATING HEMO LOK XL GOLD (MISCELLANEOUS) ×1 IMPLANT
COVER SURGICAL LIGHT HANDLE (MISCELLANEOUS) ×2 IMPLANT
DECANTER SPIKE VIAL GLASS SM (MISCELLANEOUS) ×2 IMPLANT
DERMABOND ADVANCED (GAUZE/BANDAGES/DRESSINGS) ×1
DERMABOND ADVANCED .7 DNX12 (GAUZE/BANDAGES/DRESSINGS) ×1 IMPLANT
DISSECTOR BLUNT TIP ENDO 5MM (MISCELLANEOUS) ×1 IMPLANT
ELECT PENCIL ROCKER SW 15FT (MISCELLANEOUS) ×2 IMPLANT
ELECT REM PT RETURN 15FT ADLT (MISCELLANEOUS) ×2 IMPLANT
GLOVE SURG ENC MOIS LTX SZ7.5 (GLOVE) ×2 IMPLANT
GLOVE SURG UNDER LTX SZ8 (GLOVE) ×2 IMPLANT
GOWN STRL REUS W/TWL XL LVL3 (GOWN DISPOSABLE) ×4 IMPLANT
IRRIG SUCT STRYKERFLOW 2 WTIP (MISCELLANEOUS) ×2
IRRIGATION SUCT STRKRFLW 2 WTP (MISCELLANEOUS) ×1 IMPLANT
KIT BASIN OR (CUSTOM PROCEDURE TRAY) ×2 IMPLANT
KIT TURNOVER KIT A (KITS) ×1 IMPLANT
POUCH SPECIMEN RETRIEVAL 10MM (ENDOMECHANICALS) ×2 IMPLANT
SCISSORS LAP 5X35 DISP (ENDOMECHANICALS) ×2 IMPLANT
SET TUBE SMOKE EVAC HIGH FLOW (TUBING) ×2 IMPLANT
SLEEVE ADV FIXATION 5X100MM (TROCAR) ×4 IMPLANT
SUT MNCRL AB 4-0 PS2 18 (SUTURE) ×2 IMPLANT
SYR 10ML ECCENTRIC (SYRINGE) ×2 IMPLANT
TOWEL OR 17X26 10 PK STRL BLUE (TOWEL DISPOSABLE) ×2 IMPLANT
TRAY LAPAROSCOPIC (CUSTOM PROCEDURE TRAY) ×2 IMPLANT
TROCAR ADV FIXATION 5X100MM (TROCAR) ×2 IMPLANT
TROCAR XCEL BLUNT TIP 100MML (ENDOMECHANICALS) ×2 IMPLANT

## 2021-08-29 NOTE — Op Note (Addendum)
08/29/2021 9:13 AM  PATIENT: [name]  31 y.o. female  Patient Care Team: Sheela Stack as PCP - General (Physician Assistant) Wyline Mood Dorothe Pea, MD as PCP - Cardiology (Cardiology)  PRE-OPERATIVE DIAGNOSIS: Biliary dyskinesia  POST-OPERATIVE DIAGNOSIS: Chronic cholecystitis  PROCEDURE: Laparoscopic cholecystectomy with intraoperative cholangiogram using ICG dye  SURGEON: Stephanie Coup. Cliffton Asters, MD  ASSISTANT: Violeta Gelinas MD  ANESTHESIA: General endotracheal  EBL: Total I/O In: -  Out: 10 [Blood:10]  DRAINS: None  SPECIMEN: Gallbladder  COUNTS: Sponge, needle and instrument counts were reported correct x2 at the conclusion of the operation  DISPOSITION: PACU in satisfactory condition  COMPLICATIONS: None  FINDINGS: Initially, gallbladder is not visible on entry.  Significant omental adhesions around her gallbladder essentially encasing it.  Scar tissue all the way down to the level of the infundibulum consistent with prior/"chronic" cholecystitis.  A critical view of safety was achieved prior to clipping or dividing any structures.  ICG dye was given preoperatively and there appears to be avid uptake in the liver and excretion into the biliary system.  The gallbladder fluoresce green. There was faint tracer visualized in small bowel suggesting patent biliary system.  DESCRIPTION:   The patient was identified & brought into the operating room. She was then positioned supine on the OR table. SCDs were in place and active during the entire case. She then underwent general endotracheal anesthesia. Pressure points were padded. Hair on the abdomen was clipped by the OR team. The abdomen was prepped and draped in the standard sterile fashion. Antibiotics were administered. A surgical timeout was performed and confirmed our plan.   A supraumbilical incision was made. The umbilical stalk was grasped and retracted outwardly. The supraumbilical fascia was identified  and incised. The peritoneal cavity was gently entered bluntly. A purse-string 0 Vicryl suture was placed. The Hasson cannula was inserted into the peritoneal cavity and insufflation with CO2 commenced to . A laparoscope was inserted into the peritoneal cavity and inspection confirmed no evidence of trocar site complications. The patient was then positioned in reverse Trendelenburg with slight left side down. 3 additional 80mm trocars were placed along the right subcostal line - one 29mm port in mid subcostal region, another 71mm port in the right flank near the anterior axillary line, and a third 24mm port in the left subxiphoid region obliquely near the falciform ligament.  The liver and gallbladder were inspected.  Initially, the gallbladder is not visualized as it is encased in omentum.  Omental adhesions were then carefully lysed exposing the underlying gallbladder.  There are chronic appearing type adhesions consistent with "chronic" cholecystitis/prior cholecystitis. The gallbladder fundus was grasped and elevated cephalad. An additional grasper was then placed on the infundibulum of the gallbladder and the infundibulum was retracted laterally. Staying high on the gallbladder, the peritoneum on both sides of the gallbladder was opened with hook cautery.  There again is fibrosis extending all the way down to the infundibulum consistent with prior inflammation. Gentle blunt dissection was then employed with a Art gallery manager working down into [employer]. The cystic duct was identified and carefully circumferentially dissected. The cystic artery was also identified and carefully circumferentially dissected. The space between the cystic artery and hepatocystic plate was developed such that a good view of the liver could be seen through a window medial to the cystic artery. The triangle of Calot had been cleared of all fibrofatty tissue. At this point, a critical view of safety was achieved and the  only  structures visualized was the skeletonized cystic duct laterally, the skeletonized cystic artery and the liver through the window medial to the artery. No posterior cystic artery was noted  At this point attention near infrared light was turned on for the planned ICG cholangiogram.  ICG was noted filling the liver and gallbladder.  There is only 1 ICG avid tubular structure going up to the gallbladder consistent with a cystic duct and 1 dark structure consistent with a cystic artery.  Faint tracer activity is also visualized in the proximal small bowel.  The artery was then clipped with 2 clips on the patient side and 1 clip on the specimen side.  The cystic duct was controlled with hemoclips -2 on the patient side and 1 on the specimen side.  There was a small tear that occurred at the junction of the cystic duct and gallbladder during application but this was well up on the specimen side.  The cystic duct and artery were then divided. The gallbladder was then freed from its remaining attachments to the liver using electrocautery and placed into an endocatch bag. The RUQ was gently irrigated with sterile saline. Hemostasis was then verified. The clips were in good position; the gallbladder fossa was dry.  Under near infrared light, there is no evident bile leaks either. The rest of the abdomen was inspected no injury nor bleeding elsewhere was identified.  The endocatch bag containing the gallbladder was then removed from the umbilical port site and passed off as specimen.  The fascial suture was then tied.  The defect was palpated and noted to be completely closed.  Under laparoscopic visualization, there is no evident defect either. The RUQ ports were removed under direct visualization and noted to be hemostatic. The skin of all incision sites was approximated with 4-0 monocryl subcuticular suture and dermabond applied. She was then awakened from anesthesia, extubated, and transferred to a stretcher  for transport to PACU in satisfactory condition.

## 2021-08-29 NOTE — Discharge Instructions (Signed)
POST OP INSTRUCTIONS  DIET: As tolerated. Follow a light bland diet the first 24 hours after arrival home, such as soup, liquids, crackers, etc.  Be sure to include lots of fluids daily.  Avoid fast food or heavy meals as your are more likely to get nauseated.  Eat a low fat the next few days after surgery.  Take your usually prescribed home medications unless otherwise directed.  PAIN CONTROL: Pain is best controlled by a usual combination of three different methods TOGETHER: Ice/Heat Over the counter pain medication Prescription pain medication Most patients will experience some swelling and bruising around the surgical site.  Ice packs or heating pads (30-60 minutes up to 6 times a day) will help. Some people prefer to use ice alone, heat alone, alternating between ice & heat.  Experiment to what works for you.  Swelling and bruising can take several weeks to resolve.   It is helpful to take an over-the-counter pain medication regularly for the first few weeks: Ibuprofen (Motrin/Advil) - 200mg tabs - take 3 tabs (600mg) every 6 hours as needed for pain Acetaminophen (Tylenol) - you may take 650mg every 6 hours as needed. You can take this with motrin as they act differently on the body. If you are taking a narcotic pain medication that has acetaminophen in it, do not take over the counter tylenol at the same time.  Iii. NOTE: You may take both of these medications together - most patients  find it most helpful when alternating between the two (i.e. Ibuprofen at 6am,  tylenol at 9am, ibuprofen at 12pm ...) A  prescription for pain medication should be given to you upon discharge.  Take your pain medication as prescribed if your pain is not adequatly controlled with the over-the-counter pain reliefs mentioned above.  Avoid getting constipated.  Between the surgery and the pain medications, it is common to experience some constipation.  Increasing fluid intake and taking a fiber supplement (such as  Metamucil, Citrucel, FiberCon, MiraLax, etc) 1-2 times a day regularly will usually help prevent this problem from occurring.  A mild laxative (prune juice, Milk of Magnesia, MiraLax, etc) should be taken according to package directions if there are no bowel movements after 48 hours.    Dressing: Your incisions are covered in Dermabond which is like sterile superglue for the skin. This will come off on it's own in a couple weeks. It is waterproof and you may bathe normally starting the day after your surgery in a shower. Avoid baths/pools/lakes/oceans until your wounds have fully healed.  ACTIVITIES as tolerated:   Avoid heavy lifting (>10lbs or 1 gallon of milk) for the next 6 weeks. You may resume regular (light) daily activities beginning the next day--such as daily self-care, walking, climbing stairs--gradually increasing activities as tolerated.  If you can walk 30 minutes without difficulty, it is safe to try more intense activity such as jogging, treadmill, bicycling, low-impact aerobics.  DO NOT PUSH THROUGH PAIN.  Let pain be your guide: If it hurts to do something, don't do it. You may drive when you are no longer taking prescription pain medication, you can comfortably wear a seatbelt, and you can safely maneuver your car and apply brakes.   FOLLOW UP in our office Please call [facility] at [phone] to set up an appointment to see your surgeon in the office for a follow-up appointment approximately 2 weeks after your surgery. Make sure that you call for this appointment the day you arrive home to   insure a convenient appointment time.  9. If you have disability or family leave forms that need to be completed, you may have them completed by your primary care physician's office; for return to work instructions, please ask our office staff and they will be happy to assist you in obtaining this documentation   When to call us [phone]: Poor pain control Reactions / problems with new  medications (rash/itching, etc)  Fever over 101.5 F (38.5 C) Inability to urinate Nausea/vomiting Worsening swelling or bruising Continued bleeding from incision. Increased pain, redness, or drainage from the incision  The clinic staff is available to answer your questions during regular business hours (8:30am-5pm).  Please don't hesitate to call and ask to speak to one of our nurses for clinical concerns.   A surgeon from [facility] is always on call at the hospitals   If you have a medical emergency, go to the nearest emergency room or call 911.  [facility] [address], [address], [place], [place]  [address] MAIN: [phone] FAX: [phone] [email or web address]  

## 2021-08-29 NOTE — Transfer of Care (Signed)
Immediate Anesthesia Transfer of Care Note  Patient: [name]  Procedure(s) Performed: LAPAROSCOPIC CHOLECYSTECTOMY WITH ICG  Patient Location: PACU  Anesthesia Type:General  Level of Consciousness: awake, alert , oriented and patient cooperative  Airway & Oxygen Therapy: Patient Spontanous Breathing and Patient connected to face mask oxygen  Post-op Assessment: Report given to RN and Post -op Vital signs reviewed and stable  Post vital signs: Reviewed and stable  Last Vitals:  Vitals Value Taken Time  BP 157/86 08/29/21 0926  Temp    Pulse 83 08/29/21 0930  Resp 15 08/29/21 0930  SpO2 100 % 08/29/21 0930  Vitals shown include unvalidated device data.  Last Pain:  Vitals:   08/29/21 0647  TempSrc:   PainSc: 0-No pain      Patients Stated Pain Goal: 4 (08/29/21 3762)  Complications: No notable events documented.

## 2021-08-29 NOTE — Anesthesia Procedure Notes (Signed)
Procedure Name: Intubation Date/Time: 08/29/2021 7:49 AM Performed by: Cleda Daub, CRNA Pre-anesthesia Checklist: Patient identified, Emergency Drugs available, Suction available and Patient being monitored Patient Re-evaluated:Patient Re-evaluated prior to induction Oxygen Delivery Method: Circle system utilized Preoxygenation: Pre-oxygenation with 100% oxygen Induction Type: IV induction, Cricoid Pressure applied and Rapid sequence Laryngoscope Size: Mac and 4 Grade View: Grade I Tube type: Oral Tube size: 7.0 mm Number of attempts: 1 Airway Equipment and Method: Stylet and Oral airway Placement Confirmation: ETT inserted through vocal cords under direct vision, positive ETCO2 and breath sounds checked- equal and bilateral Secured at: 21 cm Tube secured with: Tape Dental Injury: Teeth and Oropharynx as per pre-operative assessment

## 2021-08-29 NOTE — H&P (Signed)
CC: Here today for surgery  HPI: [name] is an 31 y.o. female with history of Still's disease, arthritis, obesity, GERD whom is seen in the office today as a referral by Dr. Saunders Glance for evaluation of possible biliary dyskinesia.  Reports a multi year history of intermittent deep boring cramp like pains that will occur on average twice per week and are located in the right upper quadrant. Typically lasts for 1 to 2 hours. Has been associated with greasy and fatty foods. Will occasionally occur with avoidance of these foods. Has been taking omeprazole for some time now with no change in the right upper quadrant symptoms. The GERD symptoms have however improved. Some associated nausea. No associated fevers. The pain does not migrate anywhere else in the abdomen or to her back. It is always in the right upper quadrant at the same location.   Radiographic evaluations are available under CareEverywhere.  HIDA Scan 10/20/19 - Gallbladder filling with normal ejection fraction at 48%.  RUQ [name] 10/2020 -mildly increased hepatic echogenicity suggesting mild steatosis. Patent portal vein. No gallstones or wall thickening. CBD measured 4 mm.  Repeat HIDA scan 11/2020 showed gallbladder filling and activity within the small bowel consistent with patent common bile duct. Ejection fraction measured at 30%. Normal with Ensure being greater than 33%. This was interpreted as being concerning for biliary dyskinesia.  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 Reports permanent disability related to [place] disease/arthritis  Review of Systems: A complete review of systems was obtained from the patient. I have reviewed this information  and discussed as appropriate with the patient. See HPI as well for other ROS.  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 Her smoking use included cigarettes and e-cigarettes. She started smoking about 12 years ago. She has a 5.00 pack-year smoking history. She has never used smokeless tobacco. She reports that she does not currently use alcohol. She reports current drug use. Drug: Marijuana.  Allergies:  Allergies  Allergen Reactions   Toradol [Ketorolac Tromethamine]     Hypotension    Tramadol Other (See Comments)    "Passed out for 4 hours, vomited uncontrollably"   Strawberry Extract Itching and Rash    Medications: I have reviewed the patient's current medications.  Results for orders placed or performed during the hospital encounter of 08/29/21 (from the past 48 hour(s))  Pregnancy, urine     Status: None   Collection Time: 08/29/21  6:26 AM  Result Value Ref Range  Preg Test, Ur NEGATIVE NEGATIVE    Comment:        THE SENSITIVITY OF THIS METHODOLOGY IS >20 mIU/mL. Performed at [facility], [place] [address], [place], [place] [address]   ABO/Rh     Status: None   Collection Time: 08/29/21  6:27 AM  Result Value Ref Range   ABO/RH(D)      A POS Performed at [facility], [place] [address] [place], [place] [address]     No results found.  ROS - all of the  below systems have been reviewed with the patient and positives are indicated with bold text General: chills, fever or night sweats Eyes: blurry vision or double vision ENT: epistaxis or sore throat Allergy/Immunology: itchy/watery eyes or nasal congestion Hematologic/Lymphatic: bleeding problems, blood clots or swollen lymph nodes Endocrine: temperature intolerance or unexpected weight changes Breast: new or changing breast lumps or nipple discharge Resp: cough, shortness of breath, or wheezing CV: chest pain or dyspnea on exertion GI: as per HPI GU: dysuria, trouble voiding, or hematuria MSK: joint pain or joint stiffness Neuro: TIA or stroke symptoms Derm: pruritus and skin lesion changes Psych: anxiety and depression  PE Blood pressure (!) 148/129, pulse 97, temperature (!) 97.5 F (36.4 C), temperature source Oral, resp. rate 18, height 5\' 3"  (1.6 m), weight 125.6 kg, SpO2 98 %. Constitutional: NAD; conversant Eyes: Moist conjunctiva Lungs: Normal respiratory effort CV: RRR GI: Abd soft,NT/ND; no palpable hepatosplenomegaly Psychiatric: Appropriate affect; alert and oriented x3  Results for orders placed or performed during the hospital encounter of 08/29/21 (from the past 48 hour(s))  Pregnancy, urine     Status: None   Collection Time: 08/29/21  6:26 AM  Result Value Ref Range   Preg Test, Ur NEGATIVE NEGATIVE    Comment:        THE SENSITIVITY OF THIS METHODOLOGY IS >20 mIU/mL. Performed at [facility], [place] [address], [place], [place] [address]   ABO/Rh     Status: None   Collection Time: 08/29/21  6:27 AM  Result Value Ref Range   ABO/RH(D)      A POS Performed at [facility], [place] [address] [place], [place] [address]     No results found.   A/P: [name] is an 31 y.o. female with hx of Still's disease, arthritis, obesity, GERD here for evaluation of what clinically seems most consistent with biliary  dyskinesia given her rather classic symptoms of biliary etiology.  -The anatomy and physiology of the hepatobiliary system was discussed with the patient with associated pictures using our cholecystectomy handbook. We also spent time reviewing the pathophysiology of gallbladder disease. -The options for treatment were discussed including ongoing observation vs surgery - laparoscopic cholecystectomy. -We have been careful in our discussions including expectations. We discussed that even though she does have apparent classic symptoms of biliary etiology, a 15 - 20% chance that some or all of her symptoms may persist following gallbladder removal. -The procedure, material risks (including, but not limited to, pain, bleeding, infection, scarring, need for blood transfusion, damage to surrounding structures- blood vessels/nerves/viscus/organs, damage to bile duct, bile leak, need for additional procedures, diarrhea, hernia, worsening of pre-existing medical conditions, pancreatitis, pneumonia, heart attack, stroke, death) benefits and alternatives to surgery were discussed at length. The patient's questions were answered to their satisfaction, they voiced understanding and they elected to proceed with surgery. Additionally, we discussed typical postoperative expectations and the recovery process.  Nadeen Landau, MD [facility]

## 2021-08-29 NOTE — Anesthesia Postprocedure Evaluation (Signed)
Anesthesia Post Note  Patient: Margurette M Kniskern  Procedure(s) Performed: LAPAROSCOPIC CHOLECYSTECTOMY WITH ICG     Patient location during evaluation: PACU Anesthesia Type: General Level of consciousness: awake and alert Pain management: pain level controlled Vital Signs Assessment: post-procedure vital signs reviewed and stable Respiratory status: spontaneous breathing, nonlabored ventilation, respiratory function stable and patient connected to nasal cannula oxygen Cardiovascular status: blood pressure returned to baseline and stable Postop Assessment: no apparent nausea or vomiting Anesthetic complications: no   No notable events documented.  Last Vitals:  Vitals:   08/29/21 1000 08/29/21 1025  BP: 131/75 (!) 164/82  Pulse: (!) 57 80  Resp: 12   Temp: (!) 36 C 36.7 C  SpO2: 96% 99%    Last Pain:  Vitals:   08/29/21 1025  TempSrc:   PainSc: 0-No pain                 Trevor Iha

## 2021-08-30 ENCOUNTER — Encounter (HOSPITAL_COMMUNITY): Payer: Self-pay | Admitting: Surgery

## 2021-08-30 DIAGNOSIS — F411 Generalized anxiety disorder: Secondary | ICD-10-CM | POA: Diagnosis not present

## 2021-08-30 LAB — SURGICAL PATHOLOGY

## 2021-08-31 DIAGNOSIS — Z419 Encounter for procedure for purposes other than remedying health state, unspecified: Secondary | ICD-10-CM | POA: Diagnosis not present

## 2021-09-04 DIAGNOSIS — B9789 Other viral agents as the cause of diseases classified elsewhere: Secondary | ICD-10-CM | POA: Diagnosis not present

## 2021-09-04 DIAGNOSIS — R059 Cough, unspecified: Secondary | ICD-10-CM | POA: Diagnosis not present

## 2021-09-04 DIAGNOSIS — Z20822 Contact with and (suspected) exposure to covid-19: Secondary | ICD-10-CM | POA: Diagnosis not present

## 2021-09-04 DIAGNOSIS — J019 Acute sinusitis, unspecified: Secondary | ICD-10-CM | POA: Diagnosis not present

## 2021-09-25 ENCOUNTER — Encounter (HOSPITAL_COMMUNITY): Payer: Self-pay | Admitting: Emergency Medicine

## 2021-09-25 ENCOUNTER — Other Ambulatory Visit: Payer: Self-pay

## 2021-09-25 ENCOUNTER — Emergency Department (HOSPITAL_COMMUNITY)
Admission: EM | Admit: 2021-09-25 | Discharge: 2021-09-25 | Disposition: A | Discharge status: Home / Self Care | Payer: Medicaid Other | Attending: Emergency Medicine | Admitting: Emergency Medicine

## 2021-09-25 ENCOUNTER — Emergency Department (HOSPITAL_COMMUNITY): Payer: Medicaid Other

## 2021-09-25 DIAGNOSIS — R110 Nausea: Secondary | ICD-10-CM | POA: Diagnosis not present

## 2021-09-25 DIAGNOSIS — R109 Unspecified abdominal pain: Secondary | ICD-10-CM | POA: Diagnosis not present

## 2021-09-25 DIAGNOSIS — R1011 Right upper quadrant pain: Secondary | ICD-10-CM | POA: Insufficient documentation

## 2021-09-25 LAB — CBC WITH DIFFERENTIAL/PLATELET
Abs Immature Granulocytes: 0.02 10*3/uL (ref 0.00–0.07)
Basophils Absolute: 0.1 10*3/uL (ref 0.0–0.1)
Basophils Relative: 1 %
Eosinophils Absolute: 0.4 10*3/uL (ref 0.0–0.5)
Eosinophils Relative: 4 %
HCT: 43.4 % (ref 36.0–46.0)
Hemoglobin: 14.1 g/dL (ref 12.0–15.0)
Immature Granulocytes: 0 %
Lymphocytes Relative: 25 %
Lymphs Abs: 2.5 10*3/uL (ref 0.7–4.0)
MCH: 29.1 pg (ref 26.0–34.0)
MCHC: 32.5 g/dL (ref 30.0–36.0)
MCV: 89.5 fL (ref 80.0–100.0)
Monocytes Absolute: 0.4 10*3/uL (ref 0.1–1.0)
Monocytes Relative: 5 %
Neutro Abs: 6.5 10*3/uL (ref 1.7–7.7)
Neutrophils Relative %: 65 %
Platelets: 258 10*3/uL (ref 150–400)
RBC: 4.85 MIL/uL (ref 3.87–5.11)
RDW: 14.6 % (ref 11.5–15.5)
WBC: 9.8 10*3/uL (ref 4.0–10.5)
nRBC: 0 % (ref 0.0–0.2)

## 2021-09-25 LAB — COMPREHENSIVE METABOLIC PANEL
ALT: 36 U/L (ref 0–44)
AST: 23 U/L (ref 15–41)
Albumin: 3.9 g/dL (ref 3.5–5.0)
Alkaline Phosphatase: 64 U/L (ref 38–126)
Anion gap: 7 (ref 5–15)
BUN: 11 mg/dL (ref 6–20)
CO2: 23 mmol/L (ref 22–32)
Calcium: 9.1 mg/dL (ref 8.9–10.3)
Chloride: 106 mmol/L (ref 98–111)
Creatinine, Ser: 0.87 mg/dL (ref 0.44–1.00)
GFR, Estimated: >60 mL/min (ref >60)
Glucose, Bld: 95 mg/dL (ref 70–99)
Potassium: 4.2 mmol/L (ref 3.5–5.1)
Sodium: 136 mmol/L (ref 135–145)
Total Bilirubin: 0.7 mg/dL (ref 0.3–1.2)
Total Protein: 7.7 g/dL (ref 6.5–8.1)

## 2021-09-25 LAB — HCG, QUANTITATIVE, PREGNANCY: hCG, Beta Chain, Quant, S: <1 m[IU]/mL (ref <5)

## 2021-09-25 LAB — LIPASE, BLOOD: Lipase: 26 U/L (ref 11–51)

## 2021-09-25 MED ORDER — ONDANSETRON HCL 4 MG PO TABS: 4.0000 mg | ORAL_TABLET | Freq: Every day | ORAL | 1 refills | Status: DC | PRN

## 2021-09-25 MED ORDER — HYDROCODONE-ACETAMINOPHEN 5-325 MG PO TABS: 1.0000 | ORAL_TABLET | Freq: Four times a day (QID) | ORAL | 0 refills | Status: DC | PRN

## 2021-09-25 MED ORDER — IOHEXOL 300 MG/ML  SOLN
100.0000 mL | Freq: Once | INTRAMUSCULAR | Status: AC | PRN
Start: 2021-09-25 — End: 2021-09-25
  Administered 2021-09-25: 100 mL via INTRAVENOUS

## 2021-09-25 MED ORDER — ONDANSETRON HCL 4 MG/2ML IJ SOLN
4.0000 mg | Freq: Once | INTRAMUSCULAR | Status: AC
  Administered 2021-09-25: 4 mg via INTRAVENOUS
  Filled 2021-09-25: qty 2

## 2021-09-25 MED ORDER — HYDROMORPHONE HCL 1 MG/ML IJ SOLN
1.0000 mg | Freq: Once | INTRAMUSCULAR | Status: AC
  Administered 2021-09-25: 1 mg via INTRAVENOUS
  Filled 2021-09-25: qty 1

## 2021-09-25 NOTE — ED Notes (Signed)
ED Provider at bedside. 

## 2021-09-25 NOTE — ED Triage Notes (Signed)
Patient c/o right upper abd pain that started this morning upon waking. Denies any nausea, vomiting, diarrhea, or known fever. Per patient had gallbladder removed on 08/29/2021 and pain is in same area.

## 2021-09-25 NOTE — ED Notes (Signed)
Patient transported to CT 

## 2021-09-25 NOTE — ED Notes (Signed)
Pt provided urine specimen but lab discarded due to missing label.  Pt to try again

## 2021-09-25 NOTE — ED Notes (Signed)
Pt oob to bathroom to provide urine specimen, reports increase in pain with movement

## 2021-09-25 NOTE — Discharge Instructions (Addendum)
Schedule to see Gi for recheck.  Return if any problems.  See your surgeon for follow up as previously directed

## 2021-09-25 NOTE — ED Notes (Signed)
Pt declines pregnancy test, states will sign waiver for CT

## 2021-09-26 ENCOUNTER — Telehealth: Payer: Self-pay

## 2021-09-26 NOTE — ED Provider Notes (Signed)
[facility] EMERGENCY DEPARTMENT Provider Note   CSN: [id number] Arrival date & time: 09/25/21  1159     History  Chief Complaint  Patient presents with   Abdominal Pain    Kathryn Kathryn is a 31 y.o. female.  Patient reports having  a cholecystectomy on 08/29/2021 by Dr. Dema Severin general surgeon in [place].  Patient reports having nausea and some right upper quadrant abdominal pain.  Pain is worse when walking.  Pain is relieved with breathing in no fever no chills no cough no diarrhea patient denies any urinary tract symptoms no STD symptoms.  The history is provided by the patient. No language interpreter was used.  Abdominal Pain Pain location:  RUQ Pain quality: aching   Pain severity:  Moderate Onset quality:  Gradual Duration:  1 day Timing:  Constant Progression:  Worsening Context: previous surgery   Relieved by:  Nothing Worsened by:  Nothing     Home Medications Prior to Admission medications   Medication Sig Start Date End Date Taking? Authorizing Provider  ARIPiprazole (ABILIFY) 10 MG tablet Take 10 mg by mouth daily. 02/01/21  Yes [provider]  calcium carbonate (TUMS - DOSED IN MG ELEMENTAL CALCIUM) 500 MG chewable tablet Chew 2-6 tablets by mouth daily as needed for indigestion or heartburn.   Yes [provider]  diphenhydrAMINE (BENADRYL) 25 MG tablet Take 25 mg by mouth daily as needed for allergies.   Yes [provider]  HYDROcodone-acetaminophen (NORCO/VICODIN) 5-325 MG tablet Take 1 tablet by mouth every 6 (six) hours as needed for moderate pain. 09/25/21 09/25/22 Yes Fransico Meadow, PA-C  ibuprofen (ADVIL) 200 MG tablet Take 800 mg by mouth every 6 (six) hours as needed for moderate pain.   Yes [provider]  omeprazole (PRILOSEC) 40 MG capsule Take 40 mg by mouth daily. 08/19/21  Yes [provider]  ondansetron (ZOFRAN) 4 MG tablet Take 1 tablet (4 mg total) by mouth daily as needed for nausea or  vomiting. 09/25/21 09/25/22 Yes Fransico Meadow, PA-C  testosterone cypionate (DEPOTESTOSTERONE CYPIONATE) 200 MG/ML injection Inject 40 mg into the muscle every Sunday. 01/01/21  Yes [provider]      Allergies    Toradol [ketorolac tromethamine], Tramadol, and Strawberry extract    Review of Systems   Review of Systems  Gastrointestinal:  Positive for abdominal pain.  All other systems reviewed and are negative.  Physical Exam Updated Vital Signs BP 116/74 (BP Location: Right Arm)    Pulse 76    Temp 98 F (36.7 C) (Oral)    Resp 16    Ht 5' 3" (1.6 m)    Wt 117.9 kg    LMP 10/29/2020    SpO2 97%    BMI 46.06 kg/m  Physical Exam Vitals and nursing note reviewed.  Constitutional:      Appearance: She is well-developed.  HENT:     Head: Normocephalic.  Cardiovascular:     Rate and Rhythm: Normal rate.  Pulmonary:     Effort: Pulmonary effort is normal.  Abdominal:     General: Bowel sounds are normal. There is no distension.     Palpations: Abdomen is soft.     Tenderness: There is abdominal tenderness in the right upper quadrant.  Musculoskeletal:        General: Normal range of motion.     Cervical back: Normal range of motion.  Skin:    General: Skin is warm.  Neurological:  Mental Status: She is alert and oriented to person, place, and time.    ED Results / Procedures / Treatments   Labs (all labs ordered are listed, but only abnormal results are displayed) Labs Reviewed  CBC WITH DIFFERENTIAL/PLATELET  COMPREHENSIVE METABOLIC PANEL  LIPASE, BLOOD  HCG, QUANTITATIVE, PREGNANCY    EKG None  Radiology CT ABDOMEN PELVIS W CONTRAST  Result Date: 09/25/2021 CLINICAL DATA:  Right upper quadrant abdominal pain. History of cholecystectomy on 08/29/2021 EXAM: CT ABDOMEN AND PELVIS WITH CONTRAST TECHNIQUE: Multidetector CT imaging of the abdomen and pelvis was performed using the standard protocol following bolus administration of intravenous contrast.  RADIATION DOSE REDUCTION: This exam was performed according to the departmental dose-optimization program which includes automated exposure control, adjustment of the mA and/or kV according to patient size and/or use of iterative reconstruction technique. CONTRAST:  148m OMNIPAQUE IOHEXOL 300 MG/ML  SOLN COMPARISON:  Ultrasound 11/19/2020 FINDINGS: Lower chest: Included lung bases are clear.  Heart size is normal. Hepatobiliary: Interval cholecystectomy. No fluid collection or other abnormality within the gallbladder fossa. Borderline prominence of the common bile duct is presumed postoperative. No intrahepatic biliary dilatation. Normal appearance of the liver. No focal liver abnormality. Pancreas: Unremarkable. No pancreatic ductal dilatation or surrounding inflammatory changes. Spleen: Normal in size without focal abnormality. Adrenals/Urinary Tract: Unremarkable adrenal glands. Kidneys enhance symmetrically without focal lesion, stone, or hydronephrosis. Ureters are nondilated. Urinary bladder appears unremarkable. Stomach/Bowel: Stomach is within normal limits. Appendix appears normal. No evidence of bowel wall thickening, distention, or inflammatory changes. Vascular/Lymphatic: No significant vascular findings are present. No enlarged abdominal or pelvic lymph nodes. Reproductive: Uterus and bilateral adnexa are unremarkable. Other: No free fluid. No abdominopelvic fluid collection. No pneumoperitoneum. No abdominal wall hernia. 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 No acute osseous abnormality. IMPRESSION: 1. No acute abdominopelvic findings. 2. Interval cholecystectomy without evidence of complication. 3. 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 Electronically Signed   By: NDavina PokeD.O.   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  Since today with right upper quadrant abdominal pain  Problems Addressed: Right upper quadrant abdominal pain: acute illness or injury  Amount and/or Complexity of Data Reviewed Independent Historian: parent External Data Reviewed: notes.    Details: Surgical notes reviewed from [facility] Labs: ordered. Decision-making details documented in ED Course.    Details: Laboratory evaluations including CBC c-Met lipase and urinalysis ordered.  All of patient's laboratory evaluations are normal I reviewed and interpreted labs and discussed with patient patient does not have any elevation in her white blood cell count urine is negative Radiology: ordered. Decision-making details documented in ED Course.    Details: CT scan of patient's abdomen is ordered reviewed interpreted and discussed with patient.  Patient has severe changes of  bilateral hips.  Post cholecystectomy patient has no acute abdominal findings  Risk Prescription drug management. Risk Details: Patient given IV Dilaudid and Zofran.  Patient reports relief with pain medicines.  She is advised to follow-up with GI doctor she is also advised she should follow-up with her surgeon as she missed her follow-up appointment.           Final Clinical Impression(s) / ED Diagnoses Final diagnoses:  Right upper quadrant abdominal pain    Rx / DC Orders ED Discharge Orders          Ordered    HYDROcodone-acetaminophen (NORCO/VICODIN)  5-325 MG tablet  Every 6 hours PRN        09/25/21 1744    ondansetron (ZOFRAN) 4 MG tablet  Daily PRN        09/25/21 1744           An After Visit Summary was printed and given to the patient.    Fransico Meadow, [place] 09/26/21 1544    Milton Ferguson, MD 09/29/21 1015

## 2021-09-26 NOTE — Telephone Encounter (Signed)
Transition Care Management Follow-up Telephone Call Date of discharge and from where: 09/25/2021-[place]  How have you been since you were released from the hospital? Pt stated she is doing fine.  Any questions or concerns? No  Items Reviewed: Did the pt receive and understand the discharge instructions provided? Yes  Medications obtained and verified? Yes  Other? No  Any new allergies since your discharge? No  Dietary orders reviewed? No Do you have support at home? Yes   Home Care and Equipment/Supplies: Were home health services ordered? not applicable If so, what is the name of the agency? N/A  Has the agency set up a time to come to the patient's home? not applicable Were any new equipment or medical supplies ordered?  No What is the name of the medical supply agency? N/A Were you able to get the supplies/equipment? not applicable Do you have any questions related to the use of the equipment or supplies? No  Functional Questionnaire: (I = Independent and D = Dependent) ADLs: I  Bathing/Dressing- I  Meal Prep- I  Eating- I  Maintaining continence- I  Transferring/Ambulation- I  Managing Meds- I  Follow up appointments reviewed:  PCP Hospital f/u appt confirmed? No   Specialist Hospital f/u appt confirmed? No   Are transportation arrangements needed? No  If their condition worsens, is the pt aware to call PCP or go to the Emergency Dept.? Yes Was the patient provided with contact information for the PCP's office or ED? Yes Was to pt encouraged to call back with questions or concerns? Yes

## 2021-09-28 DIAGNOSIS — Z419 Encounter for procedure for purposes other than remedying health state, unspecified: Secondary | ICD-10-CM | POA: Diagnosis not present

## 2021-10-13 DIAGNOSIS — N764 Abscess of vulva: Secondary | ICD-10-CM | POA: Diagnosis not present

## 2021-10-29 DIAGNOSIS — Z419 Encounter for procedure for purposes other than remedying health state, unspecified: Secondary | ICD-10-CM | POA: Diagnosis not present

## 2021-11-02 ENCOUNTER — Ambulatory Visit: Payer: Medicaid Other | Admitting: Family Medicine

## 2021-11-09 DIAGNOSIS — M791 Myalgia, unspecified site: Secondary | ICD-10-CM | POA: Diagnosis not present

## 2021-11-09 DIAGNOSIS — Z20822 Contact with and (suspected) exposure to covid-19: Secondary | ICD-10-CM | POA: Diagnosis not present

## 2021-11-14 DIAGNOSIS — F17210 Nicotine dependence, cigarettes, uncomplicated: Secondary | ICD-10-CM | POA: Diagnosis not present

## 2021-11-14 DIAGNOSIS — I10 Essential (primary) hypertension: Secondary | ICD-10-CM | POA: Diagnosis not present

## 2021-11-14 DIAGNOSIS — F419 Anxiety disorder, unspecified: Secondary | ICD-10-CM | POA: Diagnosis not present

## 2021-11-14 DIAGNOSIS — K219 Gastro-esophageal reflux disease without esophagitis: Secondary | ICD-10-CM | POA: Diagnosis not present

## 2021-11-14 DIAGNOSIS — M0800 Unspecified juvenile rheumatoid arthritis of unspecified site: Secondary | ICD-10-CM | POA: Diagnosis not present

## 2021-11-14 DIAGNOSIS — Z6841 Body Mass Index (BMI) 40.0 and over, adult: Secondary | ICD-10-CM | POA: Diagnosis not present

## 2021-11-14 DIAGNOSIS — F339 Major depressive disorder, recurrent, unspecified: Secondary | ICD-10-CM | POA: Diagnosis not present

## 2021-11-15 DIAGNOSIS — F411 Generalized anxiety disorder: Secondary | ICD-10-CM | POA: Diagnosis not present

## 2021-11-22 ENCOUNTER — Emergency Department (HOSPITAL_COMMUNITY)
Admission: EM | Admit: 2021-11-22 | Discharge: 2021-11-22 | Disposition: A | Discharge status: Home / Self Care | Payer: Medicaid Other | Attending: Emergency Medicine | Admitting: Emergency Medicine

## 2021-11-22 ENCOUNTER — Other Ambulatory Visit: Payer: Self-pay

## 2021-11-22 ENCOUNTER — Emergency Department (HOSPITAL_COMMUNITY): Payer: Medicaid Other

## 2021-11-22 ENCOUNTER — Encounter (HOSPITAL_COMMUNITY): Payer: Self-pay

## 2021-11-22 DIAGNOSIS — M79661 Pain in right lower leg: Secondary | ICD-10-CM | POA: Diagnosis not present

## 2021-11-22 MED ORDER — METHOCARBAMOL 500 MG PO TABS: 500.0000 mg | ORAL_TABLET | Freq: Two times a day (BID) | ORAL | 0 refills | Status: DC

## 2021-11-22 NOTE — Discharge Instructions (Signed)
As we discussed, your work-up in the ER today was reassuring for acute abnormalities.  I suspect that there may be a musculoskeletal component causing your calf pain.  Therefore I have given you a prescription for Robaxin which is a muscle relaxer for you to take as needed for relief.   Please do not drive or operate heavy machinery after taking this medication as it can be sedating. You may also take Tylenol/ibuprofen as needed for additional pain relief.  I also recommend heat, compression, and elevation of your calf for additional relief.  You may get a calf sleeve over the counter to help with compression which may give you some relief.  I have also attached some passive stretching exercises that you can try as well.  Follow-up with your PCP in the next few days for continued evaluation and management. ? ?Return if development of any new or worsening symptoms. ?

## 2021-11-22 NOTE — ED Triage Notes (Signed)
Patient with complaints of right lower leg pain in the calf area for over 2 weeks.  ?

## 2021-11-22 NOTE — ED Provider Notes (Signed)
?[facility]  EMERGENCY DEPARTMENT ?Provider Note ? ? ?CSN: [id number] ?Arrival date & time: 11/22/21  [id number] ? ?  ? ?History ? ?Chief Complaint  ?Patient presents with  ? Leg Pain  ? ? ?[name] is a 31 y.o. female. ? ?Patient with history of stills disease presents today with complaints of right calf pain.  She states that same has been ongoing for the past 3 weeks and describes the pain as 'feeling like a ball is stuck in my calf'.  States that it has been unchanged since onset, she denies any known injury.  She states she went to her primary care doctor and "they did nothing for me".  She states that she has been trying to manage the pain with Tylenol and ibuprofen with minimal success.  She denies any fevers, chills, chest pain, shortness of breath.  Denies any history of blood clots or recent surgeries.  Denies any numbness/tingling in her feet. She is able to walk without difficulty. ? ?The history is provided by the patient. No language interpreter was used.  ?Leg Pain ?Associated symptoms: no fever   ? ?  ? ?Home Medications ?Prior to Admission medications   ?Medication Sig Start Date End Date Taking? Authorizing Provider  ?ARIPiprazole (ABILIFY) 10 MG tablet Take 10 mg by mouth daily. 02/01/21   [provider]  ?calcium carbonate (TUMS - DOSED IN MG ELEMENTAL CALCIUM) 500 MG chewable tablet Chew 2-6 tablets by mouth daily as needed for indigestion or heartburn.    [provider]  ?diphenhydrAMINE (BENADRYL) 25 MG tablet Take 25 mg by mouth daily as needed for allergies.    [provider]  ?HYDROcodone-acetaminophen (NORCO/VICODIN) 5-325 MG tablet Take 1 tablet by mouth every 6 (six) hours as needed for moderate pain. 09/25/21 09/25/22  Elson AreasSofia, Leslie K, PA-C  ?ibuprofen (ADVIL) 200 MG tablet Take 800 mg by mouth every 6 (six) hours as needed for moderate pain.    [provider]  ?omeprazole (PRILOSEC) 40 MG capsule Take 40 mg by mouth daily. 08/19/21   [provider]  ?ondansetron (ZOFRAN) 4 MG tablet Take 1 tablet (4 mg total) by mouth daily as needed for nausea or vomiting. 09/25/21 09/25/22  Elson AreasSofia, Leslie K, PA-C  ?testosterone cypionate (DEPOTESTOSTERONE CYPIONATE) 200 MG/ML injection Inject 40 mg into the muscle every Sunday. 01/01/21   [provider]  ?   ? ?Allergies    ?Toradol [ketorolac tromethamine], Tramadol, and Strawberry extract   ? ?Review of Systems   ?Review of Systems  ?Constitutional:  Negative for chills and fever.  ?Respiratory:  Negative for shortness of breath.   ?Cardiovascular:  Negative for chest pain.  ?Musculoskeletal:  Positive for myalgias.  ?All other systems reviewed and are negative. ? ?Physical Exam ?Updated Vital Signs ?BP (!) 155/98 (BP Location: Right Arm)   Pulse 87   Temp 98.3 ?F (36.8 ?C) (Oral)   Resp 18   Ht 5\' 3"  (1.6 m)   Wt 113.4 kg   SpO2 99%   BMI 44.29 kg/m?  ?Physical Exam ?Vitals and nursing note reviewed.  ?Constitutional:   ?   General: She is not in acute distress. ?   Appearance: Normal appearance. She is normal weight. She is not ill-appearing, toxic-appearing or diaphoretic.  ?HENT:  ?   Head: Normocephalic and atraumatic.  ?Cardiovascular:  ?   Rate and Rhythm: Normal rate.  ?Pulmonary:  ?   Effort: Pulmonary effort is normal. No respiratory distress.  ?Musculoskeletal:     ?  General: Normal range of motion.  ?   Cervical back: Normal range of motion.  ?   Comments: DP and PT pulses intact and 2+ in bilateral lower extremities. Mild right posterior calf tenderness without erythema or warmth.  No palpable cord.  No lower extremity edema. Full ROM noted to the right leg with 5/5 strength and sensation.  ?Skin: ?   General: Skin is warm and dry.  ?Neurological:  ?   General: No focal deficit present.  ?   Mental Status: She is alert.  ?   GCS: GCS eye subscore is 4. GCS verbal subscore is 5. GCS motor subscore is 6.  ?   Sensory: Sensation is intact.  ?   Motor: Motor function is intact.  ?    Coordination: Coordination is intact.  ?   Gait: Gait is intact.  ?Psychiatric:     ?   Mood and Affect: Mood normal.     ?   Behavior: Behavior normal.  ? ? ?ED Results / Procedures / Treatments   ?Labs ?(all labs ordered are listed, but only abnormal results are displayed) ?Labs Reviewed - No data to display ? ?EKG ?None ? ?Radiology ?No results found. ? ?Procedures ?Procedures  ? ? ?Medications Ordered in ED ?Medications - No data to display ? ?ED Course/ Medical Decision Making/ A&P ?  ?                        ?Medical Decision Making ? ?Patient presents today with right lower leg pain that has been present and unchanged for the past 3 weeks.  She is afebrile, nontoxic-appearing, and in no acute distress with reassuring vital signs. DVT study negative for DVT or any other acute abnormalities. Physical exam unremarkable, no palpable abnormalities.  She is able to walk without pain.  No further emergent concerns at this time, some suspicion that this could potentially be a musculoskeletal cause of pain, we will therefore give Robaxin for same and recommend rest, ice, compression, and elevation of her leg.  We will also give information about passive stretching exercises that she can try at home as well as education to potentially try a calf sleeve for additional relief.  She is stable for discharge at this time.  Patient is understanding and amenable with plan, educated on red flag symptoms that would prompt immediate return.  Discharged in stable condition. ? ?Final Clinical Impression(s) / ED Diagnoses ?Final diagnoses:  ?Right calf pain  ? ? ?Rx / DC Orders ?ED Discharge Orders   ? ?      Ordered  ?  methocarbamol (ROBAXIN) 500 MG tablet  2 times daily       ? 11/22/21 1403  ? ?  ?  ? ?  ?An After Visit Summary was printed and given to the patient. ? ? ?  ?Silva Bandy, PA-C ?11/22/21 1406 ? ?  ?Bethann Berkshire, MD ?11/22/21 1610 ? ?

## 2021-11-28 DIAGNOSIS — Z419 Encounter for procedure for purposes other than remedying health state, unspecified: Secondary | ICD-10-CM | POA: Diagnosis not present

## 2021-11-29 DIAGNOSIS — F411 Generalized anxiety disorder: Secondary | ICD-10-CM | POA: Diagnosis not present

## 2021-12-05 IMAGING — DX DG CHEST 2V
2 series · 2 of 2 positions shown · non-contrast
Comparison: January 22, 2014

CLINICAL DATA: Chest pain.

EXAM:
CHEST - 2 VIEW

[chest pa]
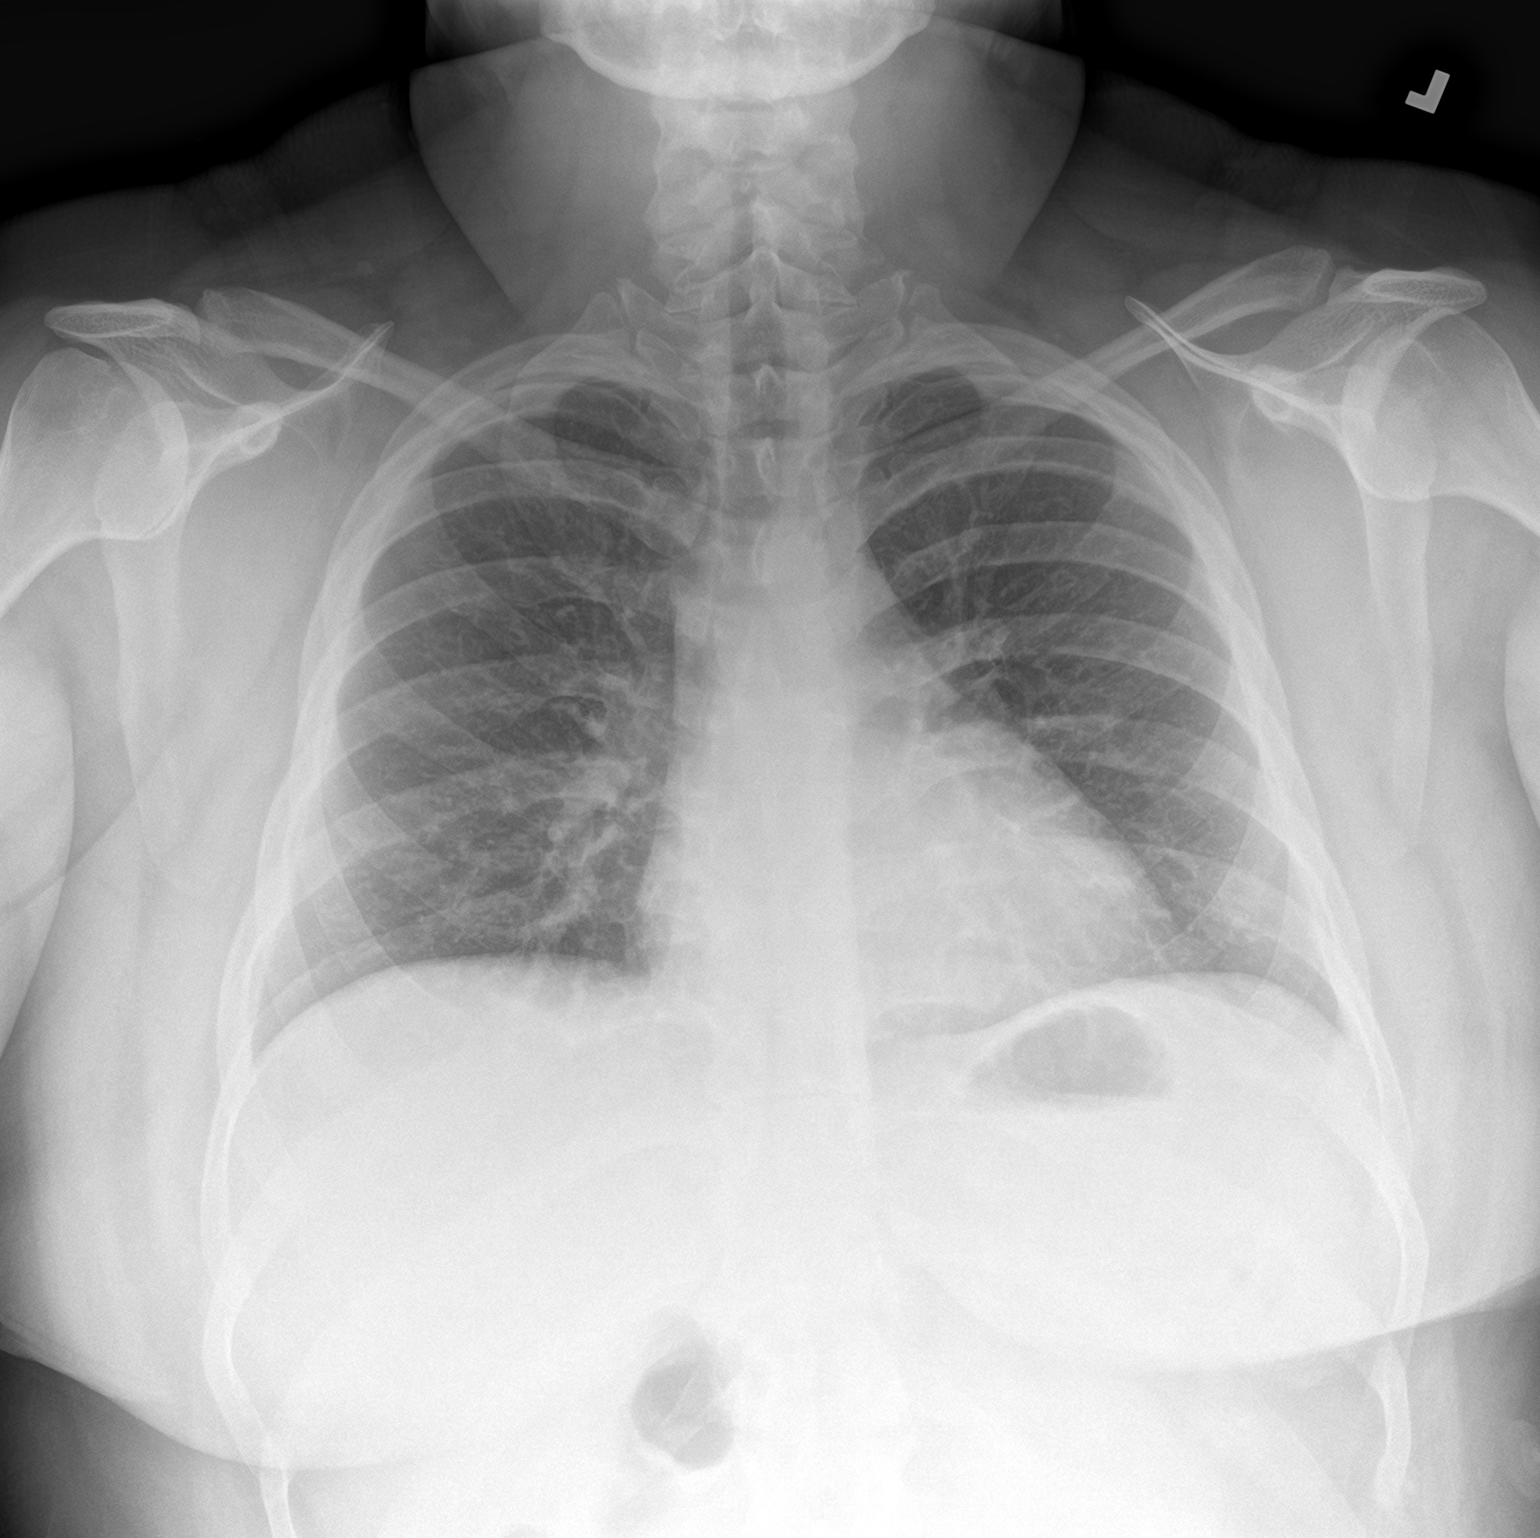

[chest lat]
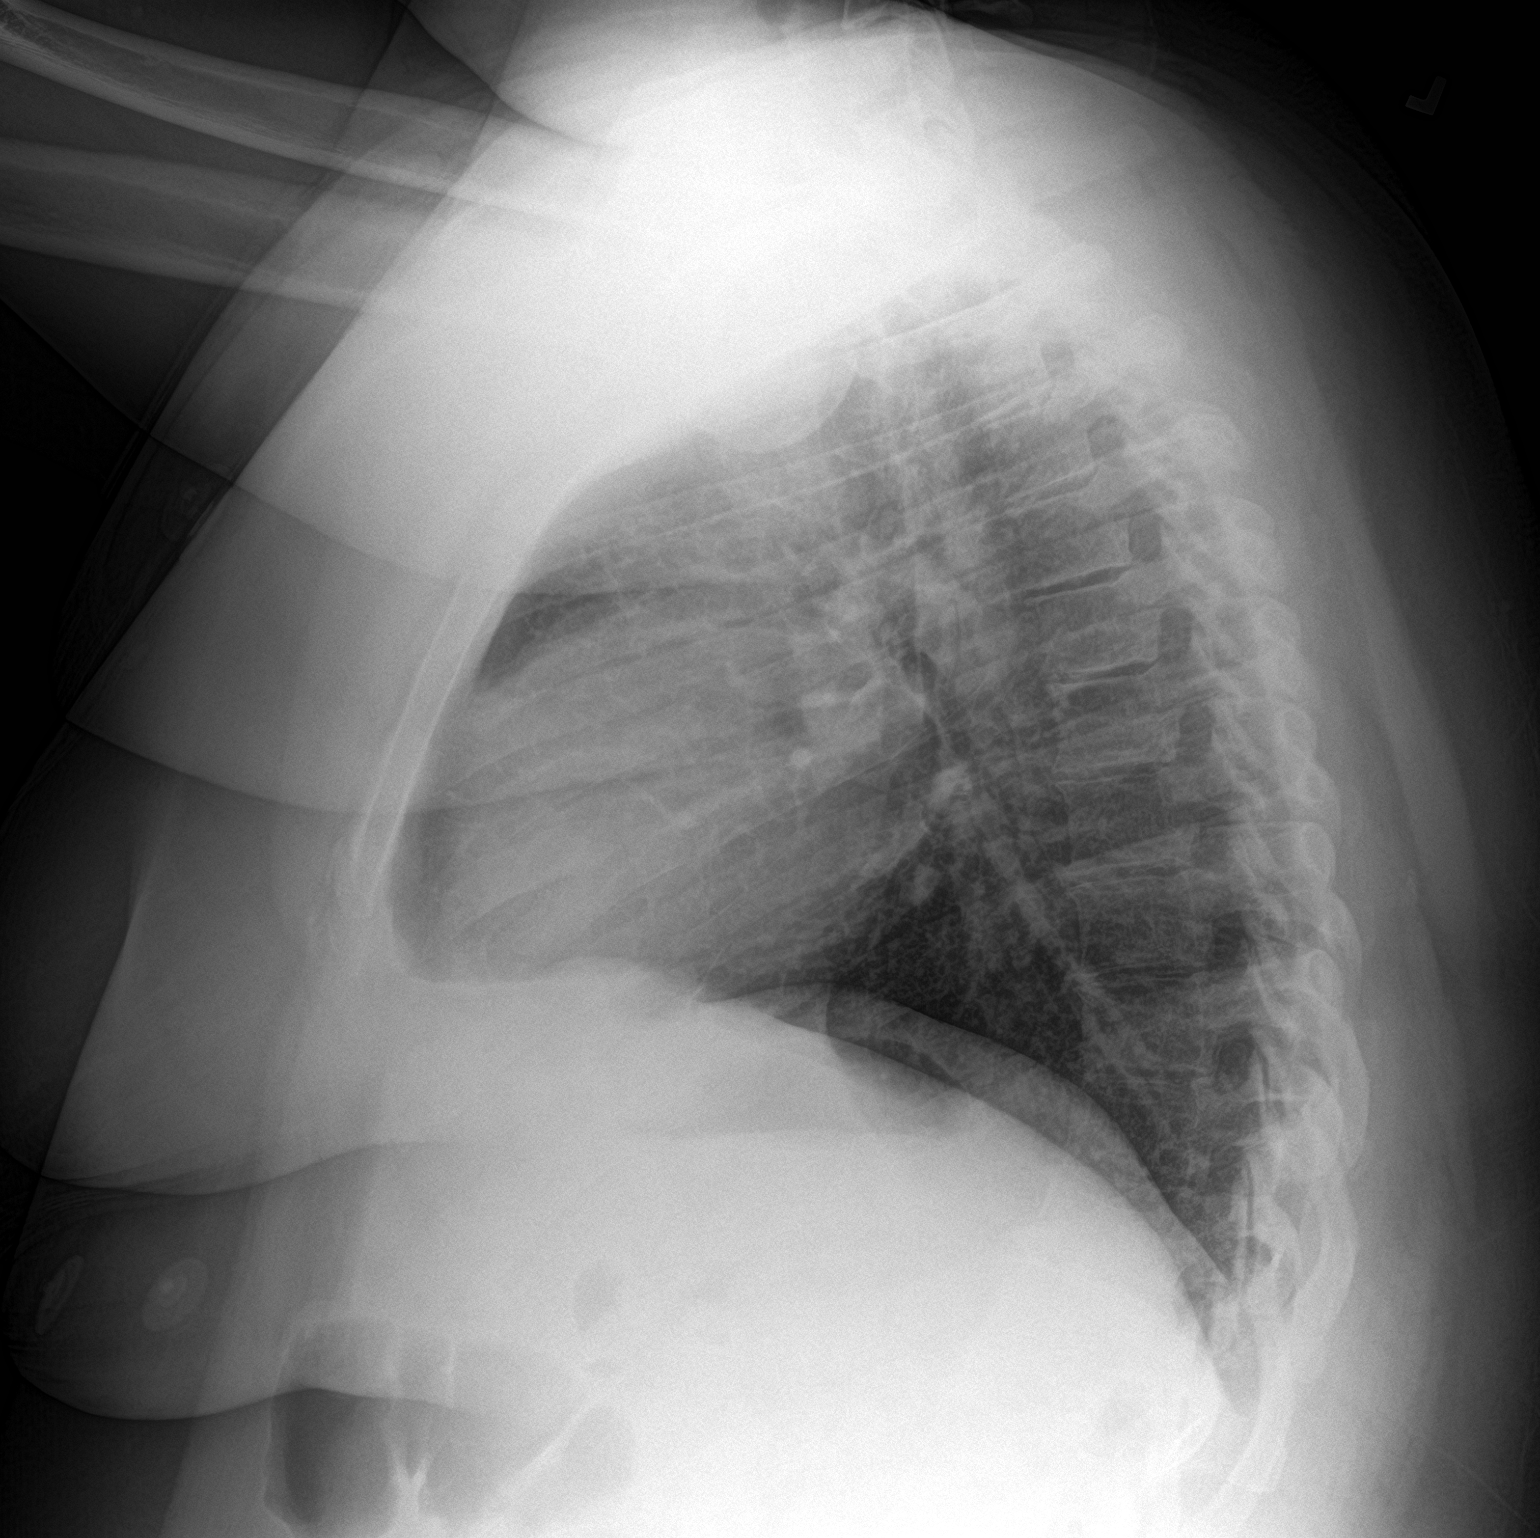

[2 of 2 positions shown; findings below may reference images not displayed]

FINDINGS: The heart size and mediastinal contours are within normal limits.
Both lungs are clear. The visualized skeletal structures are
unremarkable.
IMPRESSION: No active cardiopulmonary disease.

## 2021-12-05 IMAGING — CT CT HEAD W/O CM
4 series · 16 of 47 positions shown, 18 images · non-contrast
Comparison: Report from brain MRI 11/09/2015.

CLINICAL DATA: Neuro deficit, acute, stroke suspected

Left arm numbness, onset today.
EXAM:
CT HEAD WITHOUT CONTRAST
TECHNIQUE: Contiguous axial images were obtained from the base of the skull
through the vertex without intravenous contrast.

[Series 2: head w o · axial · 0.44mm/px · z∈[+385,+495]mm · 7 of 30 slices shown, 9 images]
[im 4/30  brain]
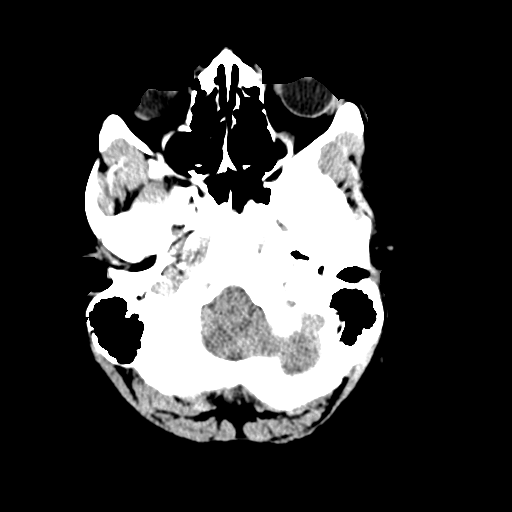
[im 4/30  bone]
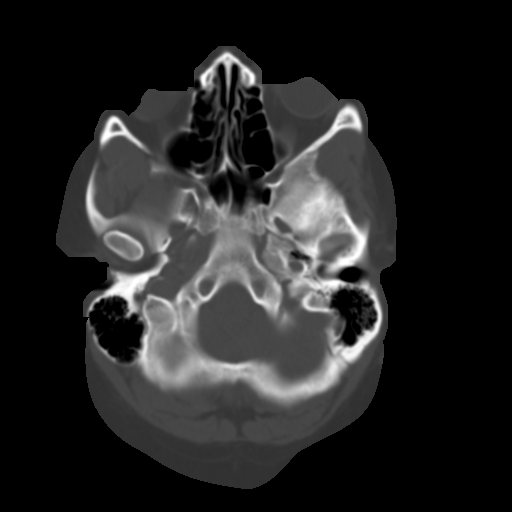
[im 8/30  brain]
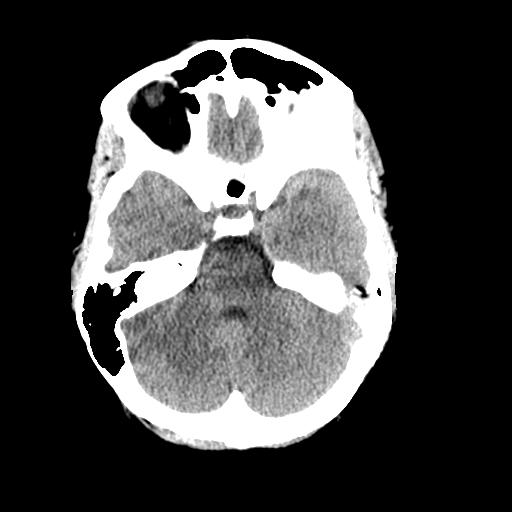
[im 11/30  brain]
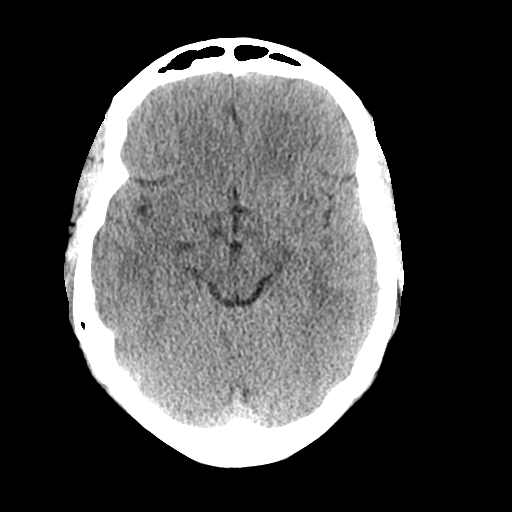
[im 15/30  brain]
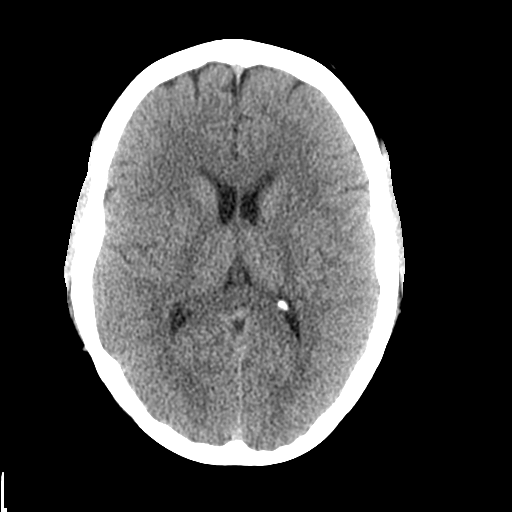
[im 19/30  brain]
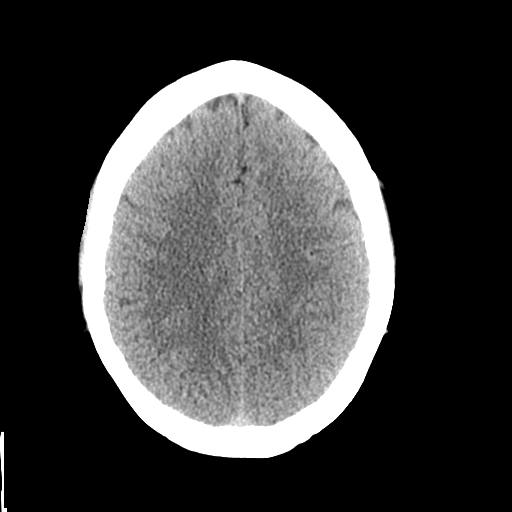
[im 19/30  bone]
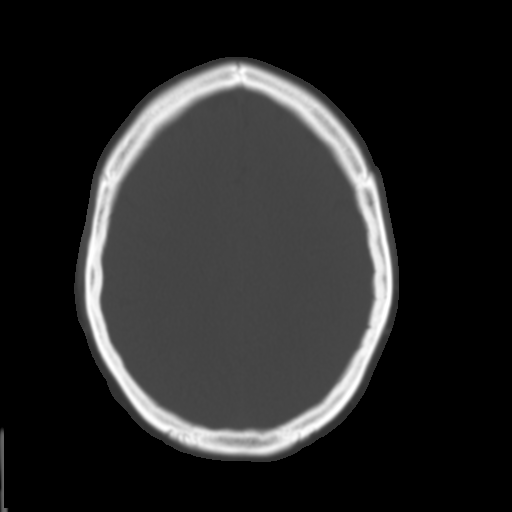
[im 22/30  brain]
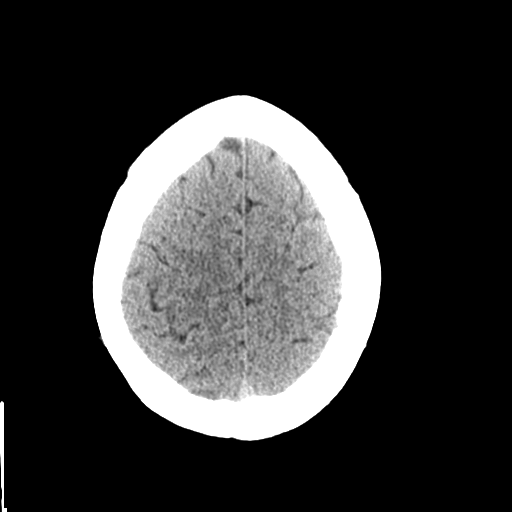
[im 26/30  brain]
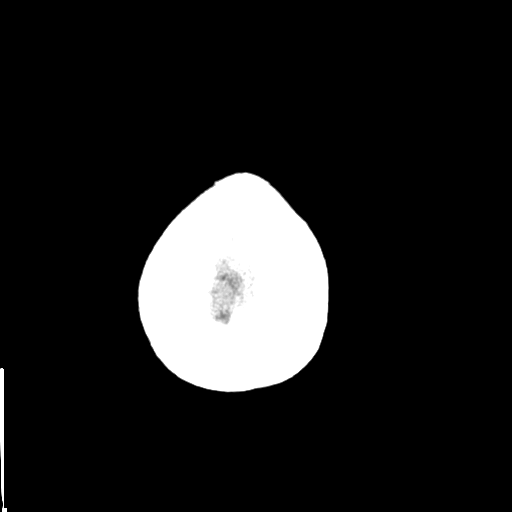

[Series 3: head bone · axial · 0.44mm/px · z∈[+384,+414]mm · 3 of 75 slices shown]
[im 8/75  bone]
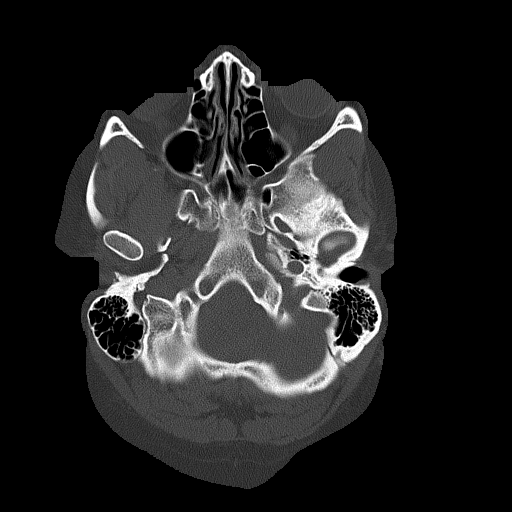
[im 15/75  bone]
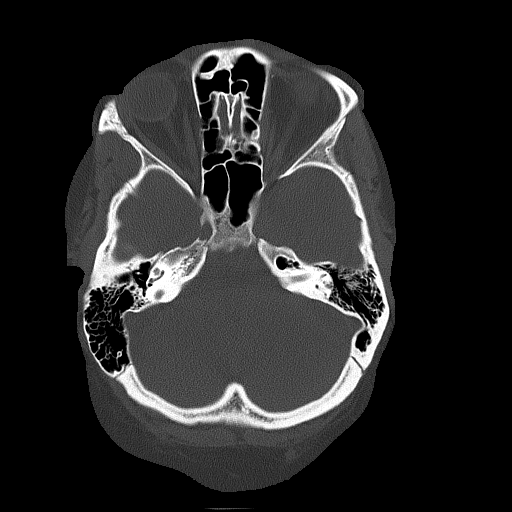
[im 23/75  bone]
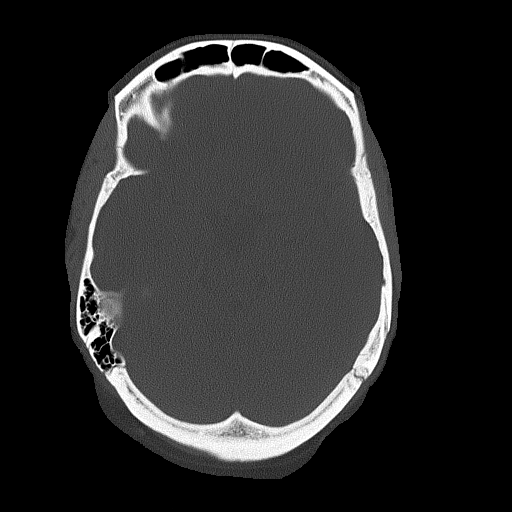

[Series 4: coronal soft · coronal · 0.31mm/px · 3 of 83 slices shown]
[im 28/83  brain]
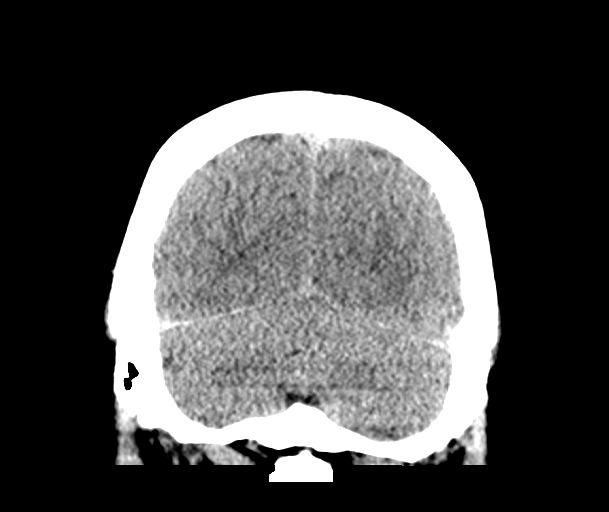
[im 37/83  brain]
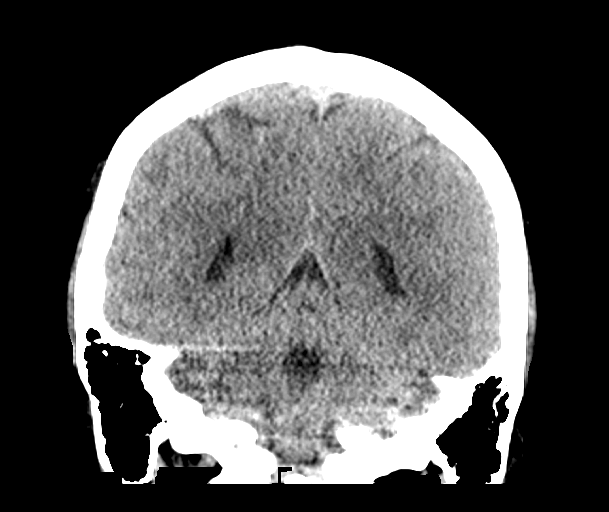
[im 46/83  brain]
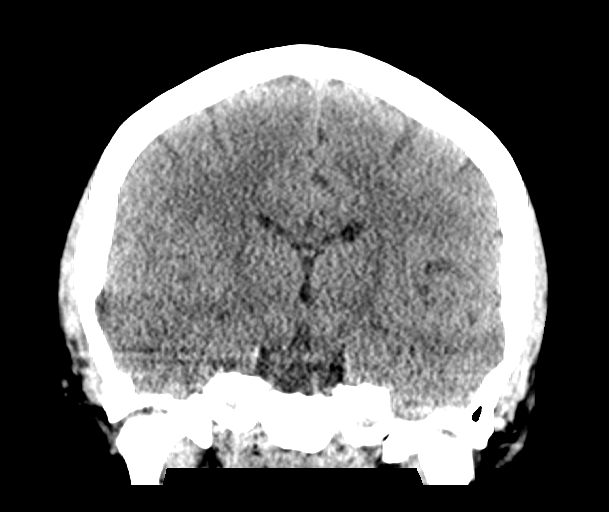

[Series 5: sagittal soft · sagittal · 0.32mm/px · 3 of 60 slices shown]
[im 20/60  brain]
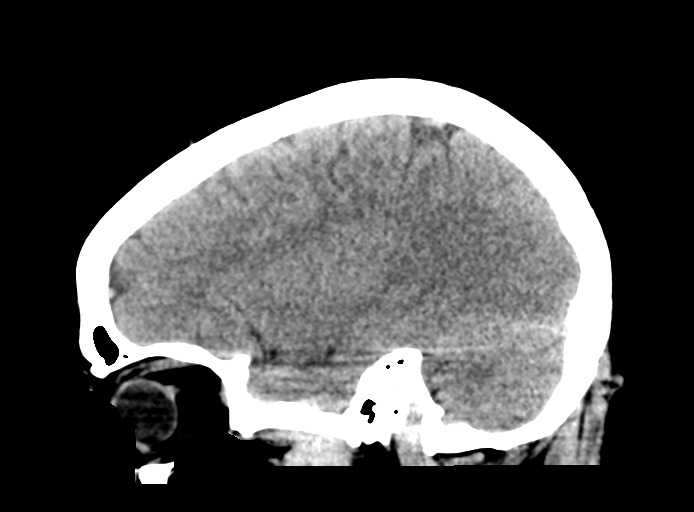
[im 30/60  brain]
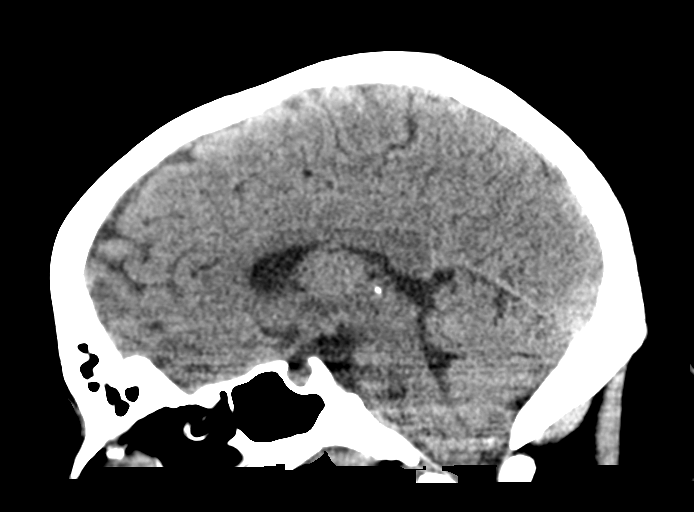
[im 40/60  brain]
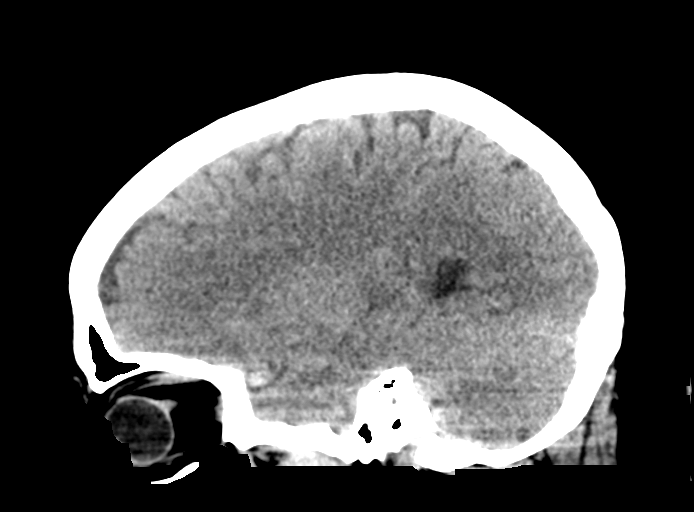

[16 of 47 positions shown; findings below may reference images not displayed]

FINDINGS: Brain: No intracranial hemorrhage, mass effect, or midline shift. No
hydrocephalus. The basilar cisterns are patent. No evidence of
territorial infarct or acute ischemia. No extra-axial or
intracranial fluid collection.

Vascular: No hyperdense vessel or unexpected calcification.

Skull: Normal. Negative for fracture or focal lesion.

Sinuses/Orbits: Paranasal sinuses and mastoid air cells are clear.
The visualized orbits are unremarkable.

Other: None.
IMPRESSION: Negative noncontrast head CT.

## 2021-12-06 DIAGNOSIS — N3001 Acute cystitis with hematuria: Secondary | ICD-10-CM | POA: Diagnosis not present

## 2021-12-06 DIAGNOSIS — R110 Nausea: Secondary | ICD-10-CM | POA: Diagnosis not present

## 2021-12-06 DIAGNOSIS — R300 Dysuria: Secondary | ICD-10-CM | POA: Diagnosis not present

## 2021-12-06 DIAGNOSIS — F17210 Nicotine dependence, cigarettes, uncomplicated: Secondary | ICD-10-CM | POA: Diagnosis not present

## 2021-12-09 ENCOUNTER — Encounter (HOSPITAL_COMMUNITY): Payer: Self-pay

## 2021-12-09 ENCOUNTER — Emergency Department (HOSPITAL_COMMUNITY)
Admission: EM | Admit: 2021-12-09 | Discharge: 2021-12-10 | Disposition: A | Discharge status: Home / Self Care | Payer: Medicaid Other | Attending: Emergency Medicine | Admitting: Emergency Medicine

## 2021-12-09 ENCOUNTER — Other Ambulatory Visit: Payer: Self-pay

## 2021-12-09 DIAGNOSIS — F411 Generalized anxiety disorder: Secondary | ICD-10-CM | POA: Diagnosis not present

## 2021-12-09 DIAGNOSIS — N390 Urinary tract infection, site not specified: Secondary | ICD-10-CM | POA: Diagnosis present

## 2021-12-09 DIAGNOSIS — N3000 Acute cystitis without hematuria: Secondary | ICD-10-CM | POA: Insufficient documentation

## 2021-12-09 DIAGNOSIS — R531 Weakness: Secondary | ICD-10-CM | POA: Insufficient documentation

## 2021-12-09 LAB — CBC WITH DIFFERENTIAL/PLATELET
Abs Immature Granulocytes: 0.03 10*3/uL (ref 0.00–0.07)
Basophils Absolute: 0.1 10*3/uL (ref 0.0–0.1)
Basophils Relative: 1 %
Eosinophils Absolute: 0.3 10*3/uL (ref 0.0–0.5)
Eosinophils Relative: 3 %
HCT: 43.4 % (ref 36.0–46.0)
Hemoglobin: 14.1 g/dL (ref 12.0–15.0)
Immature Granulocytes: 0 %
Lymphocytes Relative: 27 %
Lymphs Abs: 3.1 10*3/uL (ref 0.7–4.0)
MCH: 29 pg (ref 26.0–34.0)
MCHC: 32.5 g/dL (ref 30.0–36.0)
MCV: 89.1 fL (ref 80.0–100.0)
Monocytes Absolute: 0.6 10*3/uL (ref 0.1–1.0)
Monocytes Relative: 5 %
Neutro Abs: 7.4 10*3/uL (ref 1.7–7.7)
Neutrophils Relative %: 64 %
Platelets: 304 10*3/uL (ref 150–400)
RBC: 4.87 MIL/uL (ref 3.87–5.11)
RDW: 14.6 % (ref 11.5–15.5)
WBC: 11.5 10*3/uL — ABNORMAL HIGH (ref 4.0–10.5)
nRBC: 0 % (ref 0.0–0.2)

## 2021-12-09 NOTE — ED Notes (Signed)
Pt given ice pack for pulled muscle in leg ?

## 2021-12-09 NOTE — ED Triage Notes (Signed)
Pt presents to Ed from home, wants to be seen for UTI that was diagnosed on Monday, pt not feeling any better, urinary sx persist, PCP called today to inform pt cultures were positive for ecoli, pt was unable to pick up anbx that was changed from the bactrim she was prescribed on Monday prior to cultures. ?

## 2021-12-10 LAB — URINALYSIS, ROUTINE W REFLEX MICROSCOPIC
Bilirubin Urine: NEGATIVE
Glucose, UA: NEGATIVE mg/dL
Ketones, ur: 5 mg/dL — AB
Nitrite: POSITIVE — AB
Protein, ur: 100 mg/dL — AB
RBC / HPF: >50 RBC/hpf — ABNORMAL HIGH (ref 0–5)
Specific Gravity, Urine: 1.021 (ref 1.005–1.030)
WBC, UA: >50 WBC/hpf — ABNORMAL HIGH (ref 0–5)
pH: 5 (ref 5.0–8.0)

## 2021-12-10 LAB — BASIC METABOLIC PANEL
Anion gap: 7 (ref 5–15)
BUN: 10 mg/dL (ref 6–20)
CO2: 24 mmol/L (ref 22–32)
Calcium: 9.2 mg/dL (ref 8.9–10.3)
Chloride: 105 mmol/L (ref 98–111)
Creatinine, Ser: 1 mg/dL (ref 0.44–1.00)
GFR, Estimated: >60 mL/min (ref >60)
Glucose, Bld: 107 mg/dL — ABNORMAL HIGH (ref 70–99)
Potassium: 3.7 mmol/L (ref 3.5–5.1)
Sodium: 136 mmol/L (ref 135–145)

## 2021-12-10 MED ORDER — LACTATED RINGERS IV BOLUS
1000.0000 mL | Freq: Once | INTRAVENOUS | Status: AC
  Administered 2021-12-10: 1000 mL via INTRAVENOUS

## 2021-12-10 MED ORDER — CIPROFLOXACIN IN D5W 400 MG/200ML IV SOLN
400.0000 mg | Freq: Once | INTRAVENOUS | Status: AC
  Administered 2021-12-10: 400 mg via INTRAVENOUS
  Filled 2021-12-10: qty 200

## 2021-12-10 NOTE — ED Provider Notes (Signed)
?[facility] ?Provider Note ? ? ?CSN: [id number] ?Arrival date & time: 12/09/21  2019 ? ?  ? ?History ? ?Chief Complaint  ?Patient presents with  ? UTI  ? ? ?[name] is a 31 y.o. female. ? ?31 yo F here with UTI. Also with nausea, weakness, generally feeling unwell. Supposed to switch to cipro but hasn't done that. Had a fever. No pain.  ? ? ? ?  ? ?Home Medications ?Prior to Admission medications   ?Medication Sig Start Date End Date Taking? Authorizing Provider  ?ARIPiprazole (ABILIFY) 10 MG tablet Take 10 mg by mouth daily. 02/01/21   [provider]  ?calcium carbonate (TUMS - DOSED IN MG ELEMENTAL CALCIUM) 500 MG chewable tablet Chew 2-6 tablets by mouth daily as needed for indigestion or heartburn.    [provider]  ?diphenhydrAMINE (BENADRYL) 25 MG tablet Take 25 mg by mouth daily as needed for allergies.    [provider]  ?HYDROcodone-acetaminophen (NORCO/VICODIN) 5-325 MG tablet Take 1 tablet by mouth every 6 (six) hours as needed for moderate pain. 09/25/21 09/25/22  Fransico Meadow, PA-C  ?ibuprofen (ADVIL) 200 MG tablet Take 800 mg by mouth every 6 (six) hours as needed for moderate pain.    [provider]  ?methocarbamol (ROBAXIN) 500 MG tablet Take 1 tablet (500 mg total) by mouth 2 (two) times daily. 11/22/21   Smoot, Leary Roca, PA-C  ?omeprazole (PRILOSEC) 40 MG capsule Take 40 mg by mouth daily. 08/19/21   [provider]  ?ondansetron (ZOFRAN) 4 MG tablet Take 1 tablet (4 mg total) by mouth daily as needed for nausea or vomiting. 09/25/21 09/25/22  Fransico Meadow, PA-C  ?testosterone cypionate (DEPOTESTOSTERONE CYPIONATE) 200 MG/ML injection Inject 40 mg into the muscle every Sunday. 01/01/21   [provider]  ?   ? ?Allergies    ?Toradol [ketorolac tromethamine], Tramadol, and Strawberry extract   ? ?Review of Systems   ?Review of Systems ? ?Physical Exam ?Updated Vital Signs ?BP 117/72 (BP Location: Right Arm)   Pulse 82    Temp 98.1 ?F (36.7 ?C) (Oral)   Resp 18   Ht 5\' 3"  (1.6 m)   Wt 113.4 kg   LMP 11/23/2021 (Approximate)   SpO2 99%   BMI 44.29 kg/m?  ?Physical Exam ?Vitals and nursing note reviewed.  ?Constitutional:   ?   Appearance: She is well-developed.  ?HENT:  ?   Head: Normocephalic and atraumatic.  ?   Nose: Nose normal. No congestion or rhinorrhea.  ?   Mouth/Throat:  ?   Mouth: Mucous membranes are moist.  ?   Pharynx: Oropharynx is clear.  ?Eyes:  ?   Pupils: Pupils are equal, round, and reactive to light.  ?Cardiovascular:  ?   Rate and Rhythm: Normal rate and regular rhythm.  ?Pulmonary:  ?   Effort: No respiratory distress.  ?   Breath sounds: No stridor.  ?Abdominal:  ?   General: Abdomen is flat. There is no distension.  ?Musculoskeletal:     ?   General: No swelling or tenderness. Normal range of motion.  ?   Cervical back: Normal range of motion.  ?Skin: ?   General: Skin is warm and dry.  ?Neurological:  ?   General: No focal deficit present.  ?   Mental Status: She is alert.  ? ? ?ED Results / Procedures / Treatments   ?Labs ?(all labs ordered are listed, but only abnormal results are displayed) ?  Labs Reviewed  ?URINALYSIS, ROUTINE W REFLEX MICROSCOPIC - Abnormal; Notable for the following components:  ?    Result Value  ? Color, Urine AMBER (*)   ? APPearance TURBID (*)   ? Hgb urine dipstick LARGE (*)   ? Ketones, ur 5 (*)   ? Protein, ur 100 (*)   ? Nitrite POSITIVE (*)   ? Leukocytes,Ua LARGE (*)   ? RBC / HPF >50 (*)   ? WBC, UA >50 (*)   ? Bacteria, UA MANY (*)   ? All other components within normal limits  ?CBC WITH DIFFERENTIAL/PLATELET - Abnormal; Notable for the following components:  ? WBC 11.5 (*)   ? All other components within normal limits  ?BASIC METABOLIC PANEL - Abnormal; Notable for the following components:  ? Glucose, Bld 107 (*)   ? All other components within normal limits  ?CULTURE, BLOOD (ROUTINE X 2)  ?CULTURE, BLOOD (ROUTINE X 2)  ? ? ?EKG ?None ? ?Radiology ?No results  found. ? ?Procedures ?Procedures  ? ? ?Medications Ordered in ED ?Medications  ?lactated ringers bolus 1,000 mL (0 mLs Intravenous Stopped 12/10/21 0426)  ?ciprofloxacin (CIPRO) IVPB 400 mg (0 mg Intravenous Stopped 12/10/21 0334)  ? ? ?ED Course/ Medical Decision Making/ A&P ?  ?                        ?Medical Decision Making ?Amount and/or Complexity of Data Reviewed ?Labs: ordered. ? ?Risk ?Prescription drug management. ? ? ?UTI. No sepsis but with fever and generalized symptoms, will check blood cultures.  ?Patient feeling better with IV abx and fluids. Stable for d/c.  ? ?Final Clinical Impression(s) / ED Diagnoses ?Final diagnoses:  ?Acute cystitis without hematuria  ? ? ?Rx / DC Orders ?ED Discharge Orders   ? ? None  ? ?  ? ? ?  ?Merrily Pew, MD ?12/10/21 [phone] ? ?

## 2021-12-15 DIAGNOSIS — F411 Generalized anxiety disorder: Secondary | ICD-10-CM | POA: Diagnosis not present

## 2021-12-15 LAB — CULTURE, BLOOD (ROUTINE X 2)
Culture: NO GROWTH
Culture: NO GROWTH
Special Requests: ADEQUATE
Special Requests: ADEQUATE

## 2021-12-20 DIAGNOSIS — F411 Generalized anxiety disorder: Secondary | ICD-10-CM | POA: Diagnosis not present

## 2021-12-22 DIAGNOSIS — J069 Acute upper respiratory infection, unspecified: Secondary | ICD-10-CM | POA: Diagnosis not present

## 2021-12-22 DIAGNOSIS — Z20822 Contact with and (suspected) exposure to covid-19: Secondary | ICD-10-CM | POA: Diagnosis not present

## 2021-12-25 ENCOUNTER — Observation Stay (HOSPITAL_COMMUNITY)
Admission: AD | Admit: 2021-12-25 | Discharge: 2021-12-27 | Disposition: A | Discharge status: Home / Self Care | Payer: Medicaid Other | Source: Other Acute Inpatient Hospital | Attending: Internal Medicine | Admitting: Internal Medicine

## 2021-12-25 ENCOUNTER — Encounter (HOSPITAL_COMMUNITY): Payer: Self-pay | Admitting: Family Medicine

## 2021-12-25 ENCOUNTER — Other Ambulatory Visit: Payer: Self-pay

## 2021-12-25 DIAGNOSIS — R0683 Snoring: Secondary | ICD-10-CM | POA: Diagnosis not present

## 2021-12-25 DIAGNOSIS — Z6841 Body Mass Index (BMI) 40.0 and over, adult: Secondary | ICD-10-CM | POA: Insufficient documentation

## 2021-12-25 DIAGNOSIS — R7401 Elevation of levels of liver transaminase levels: Secondary | ICD-10-CM

## 2021-12-25 DIAGNOSIS — Z720 Tobacco use: Secondary | ICD-10-CM | POA: Diagnosis not present

## 2021-12-25 DIAGNOSIS — R079 Chest pain, unspecified: Principal | ICD-10-CM

## 2021-12-25 DIAGNOSIS — R072 Precordial pain: Secondary | ICD-10-CM | POA: Diagnosis not present

## 2021-12-25 DIAGNOSIS — Z87891 Personal history of nicotine dependence: Secondary | ICD-10-CM | POA: Diagnosis not present

## 2021-12-25 DIAGNOSIS — Z79899 Other long term (current) drug therapy: Secondary | ICD-10-CM | POA: Diagnosis not present

## 2021-12-25 DIAGNOSIS — F3175 Bipolar disorder, in partial remission, most recent episode depressed: Secondary | ICD-10-CM | POA: Diagnosis present

## 2021-12-25 DIAGNOSIS — I200 Unstable angina: Secondary | ICD-10-CM | POA: Diagnosis not present

## 2021-12-25 DIAGNOSIS — Z885 Allergy status to narcotic agent status: Secondary | ICD-10-CM | POA: Diagnosis not present

## 2021-12-25 DIAGNOSIS — F319 Bipolar disorder, unspecified: Secondary | ICD-10-CM | POA: Diagnosis not present

## 2021-12-25 DIAGNOSIS — R001 Bradycardia, unspecified: Secondary | ICD-10-CM | POA: Diagnosis not present

## 2021-12-25 DIAGNOSIS — I252 Old myocardial infarction: Secondary | ICD-10-CM | POA: Diagnosis not present

## 2021-12-25 DIAGNOSIS — R748 Abnormal levels of other serum enzymes: Secondary | ICD-10-CM

## 2021-12-25 DIAGNOSIS — F332 Major depressive disorder, recurrent severe without psychotic features: Secondary | ICD-10-CM | POA: Diagnosis present

## 2021-12-25 DIAGNOSIS — K219 Gastro-esophageal reflux disease without esophagitis: Secondary | ICD-10-CM

## 2021-12-25 DIAGNOSIS — M0820 Juvenile rheumatoid arthritis with systemic onset, unspecified site: Principal | ICD-10-CM | POA: Diagnosis present

## 2021-12-25 LAB — TSH: TSH: 0.797 u[IU]/mL (ref 0.350–4.500)

## 2021-12-25 LAB — SEDIMENTATION RATE: Sed Rate: 26 mm/hr — ABNORMAL HIGH (ref 0–22)

## 2021-12-25 LAB — C-REACTIVE PROTEIN: CRP: 1 mg/dL — ABNORMAL HIGH (ref <1.0)

## 2021-12-25 LAB — HIV ANTIBODY (ROUTINE TESTING W REFLEX): HIV Screen 4th Generation wRfx: NONREACTIVE

## 2021-12-25 LAB — FERRITIN: Ferritin: 42 ng/mL (ref 11–307)

## 2021-12-25 MED ORDER — ONDANSETRON HCL 4 MG/2ML IJ SOLN: 4.0000 mg | Freq: Four times a day (QID) | INTRAMUSCULAR | Status: DC | PRN

## 2021-12-25 MED ORDER — ONDANSETRON HCL 4 MG PO TABS: 4.0000 mg | ORAL_TABLET | Freq: Four times a day (QID) | ORAL | Status: DC | PRN

## 2021-12-25 MED ORDER — PANTOPRAZOLE SODIUM 40 MG PO TBEC
80.0000 mg | DELAYED_RELEASE_TABLET | Freq: Every day | ORAL | Status: DC
  Administered 2021-12-26 – 2021-12-27 (×2): 80 mg via ORAL
  Filled 2021-12-25 (×2): qty 2

## 2021-12-25 MED ORDER — SODIUM CHLORIDE 0.9% FLUSH: 3.0000 mL | INTRAVENOUS | Status: DC | PRN

## 2021-12-25 MED ORDER — ALPRAZOLAM 0.25 MG PO TABS
0.2500 mg | ORAL_TABLET | Freq: Every day | ORAL | Status: DC | PRN
  Administered 2021-12-25 – 2021-12-26 (×2): 0.25 mg via ORAL
  Filled 2021-12-25 (×2): qty 1

## 2021-12-25 MED ORDER — ARIPIPRAZOLE 2 MG PO TABS
2.0000 mg | ORAL_TABLET | Freq: Every day | ORAL | Status: DC
  Administered 2021-12-26 – 2021-12-27 (×2): 2 mg via ORAL
  Filled 2021-12-25 (×2): qty 1

## 2021-12-25 MED ORDER — SODIUM CHLORIDE 0.9 % IV SOLN: 250.0000 mL | INTRAVENOUS | Status: DC | PRN

## 2021-12-25 MED ORDER — ENOXAPARIN SODIUM 40 MG/0.4ML IJ SOSY
40.0000 mg | PREFILLED_SYRINGE | INTRAMUSCULAR | Status: DC
  Administered 2021-12-25 – 2021-12-26 (×2): 40 mg via SUBCUTANEOUS
  Filled 2021-12-25 (×2): qty 0.4

## 2021-12-25 MED ORDER — SODIUM CHLORIDE 0.9% FLUSH
3.0000 mL | Freq: Two times a day (BID) | INTRAVENOUS | Status: DC
  Administered 2021-12-25 – 2021-12-27 (×4): 3 mL via INTRAVENOUS

## 2021-12-25 MED ORDER — ACETAMINOPHEN 325 MG PO TABS
650.0000 mg | ORAL_TABLET | Freq: Four times a day (QID) | ORAL | Status: DC | PRN
  Administered 2021-12-27: 650 mg via ORAL
  Filled 2021-12-25: qty 2

## 2021-12-25 MED ORDER — HYDROCODONE-ACETAMINOPHEN 5-325 MG PO TABS
1.0000 | ORAL_TABLET | ORAL | Status: DC | PRN
  Administered 2021-12-26 – 2021-12-27 (×2): 2 via ORAL
  Filled 2021-12-25 (×2): qty 2

## 2021-12-25 MED ORDER — ACETAMINOPHEN 650 MG RE SUPP: 650.0000 mg | Freq: Four times a day (QID) | RECTAL | Status: DC | PRN

## 2021-12-25 NOTE — Assessment & Plan Note (Signed)
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 Leflunomide discontinued given hepatic steatosis and h/o elevated LFTs

## 2021-12-25 NOTE — Assessment & Plan Note (Signed)
Continue abilify  

## 2021-12-25 NOTE — Assessment & Plan Note (Addendum)
Lipase 86 No pain, TTP on exam, N/V Will repeat in AM

## 2021-12-25 NOTE — Assessment & Plan Note (Signed)
Continue PPI ?

## 2021-12-25 NOTE — Assessment & Plan Note (Addendum)
31 year old with history of acute onset of chest pain today with history of adult onset Still's disease  -obs to telemetry -work up for pericarditis in setting of still's disease  -routine echo, no indication for emergent echo. Vitals stable, no chest pain, no rub on exam.  -inflammatory markers -chest pain free -troponin wnl x 2 -ekg pending -CTA chest done at [facility] [place]-called for results. Negative PE, mild cardiomegaly  -consult cardiology tomorrow

## 2021-12-25 NOTE — H&P (Signed)
History and Physical    Patient: [name] HUT:[id number] DOB: 03-23-1991 DOA: 12/25/2021 DOS: the patient was seen and examined on 12/25/2021 PCP: Rosalee Kaufman, PA-C  Patient coming from: Outside Hospital - lives alone. Uses walker at times.    Chief Complaint: chest pain   HPI: [name] is a 31 y.o. adult with medical history significant of Stills disease, depression, bipolar, GERD who presented to outside facility with complaints of chest pain x 1 day. He states he woke up around 4Am and had pain over his left, lateral anterior chest wall that was squeezing in nature. He met his dad for breakfast and states he all of a sudden had light headedness/dizziness and the same chest pain that felt like an elephant sitting his chest. Pain radiated to left shoulder and up left jaw/into back. Had left hand tingling. He had no shortness of breath, but some nausea. Episode lasted 10-15 minutes and went to ER. Felt like NG helped the pain.   States he may have had subjective fever this week and allergies have been acting up. Having headaches and has been taking a lot of NSAIDs. He states his left eye hurts to move, no surrounding redness or vision loss,  no palpitations, no shortness of breath or cough, abdominal pain, V/D, dysuria or leg swelling.   +FH of CAD. Her mother died of MI at age 47.   He vapes, does not drink alcohol.    ER Course:  per accepting physician:  31 year old female transferred from [facility] with breast medical history significant of stills disease who presented with complaints of chest pain.  Prior history of pericarditis in 2014.  Patient has been given nitroglycerin and aspirin.  EKG noted sinus bradycardia with 2 negative troponins.  Chest pain resolved with nitroglycerin face but wants removed return and therefore was replaced.  Case had been discussed with Dr. Harrell Gave of cardiology here at [facility].  Cardiology and echo services not available  until Wednesday.  Requesting transfer for need of further work-up.  Accepted to a cardiac telemetry bed.    Review of Systems: As mentioned in the history of present illness. All other systems reviewed and are negative. 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 His smoking use included cigarettes and e-cigarettes. He started smoking about 12 years ago. He has a 5.00 pack-year smoking history. He has never used smokeless tobacco. He reports current drug use. Drug: Marijuana. He reports that he does not drink alcohol.  Allergies  Allergen Reactions   Strawberry Extract Itching and Rash    Family History  Problem Relation Age of Onset   Heart disease Mother    Heart attack Mother    Depression Father    Rheum arthritis  Father    Hypertension Father    Bipolar disorder Brother     Prior to Admission medications   Medication Sig Start Date End Date Taking? Authorizing Provider  ARIPiprazole (ABILIFY) 10 MG tablet Take 10 mg by mouth daily. 02/01/21   [provider]  calcium carbonate (TUMS - DOSED IN MG ELEMENTAL CALCIUM) 500 MG chewable tablet Chew 2-6 tablets by mouth daily as needed for indigestion or heartburn.    [provider]  diphenhydrAMINE (BENADRYL) 25 MG tablet Take 25 mg by mouth daily as needed for allergies.    [provider]  HYDROcodone-acetaminophen (NORCO/VICODIN) 5-325 MG tablet Take 1 tablet by mouth every 6 (six) hours as needed for moderate pain. 09/25/21 09/25/22  Fransico Meadow, PA-C  ibuprofen (ADVIL) 200 MG tablet Take 800 mg by mouth every 6 (six) hours as needed for moderate pain.    [provider]  methocarbamol (ROBAXIN) 500 MG tablet Take 1 tablet (500 mg total) by mouth 2 (two) times daily. 11/22/21   Smoot, Leary Roca, PA-C  omeprazole (PRILOSEC) 40 MG capsule Take 40 mg by mouth daily. 08/19/21   [provider]  ondansetron (ZOFRAN) 4 MG tablet Take 1 tablet (4 mg total) by mouth daily as needed for nausea or vomiting. 09/25/21 09/25/22  Fransico Meadow, PA-C  testosterone cypionate (DEPOTESTOSTERONE CYPIONATE) 200 MG/ML injection Inject 40 mg into the muscle every Sunday. 01/01/21   [provider]    Physical Exam: Vitals:   12/25/21 1730 12/25/21 1812 12/25/21 2030  BP: 139/82  130/76  Pulse: 64  63  Resp: 18  18  Temp: 98.8 F (37.1 C)  98.4 F (36.9 C)  TempSrc: Oral  Oral  SpO2: 99%  99%  Weight:  121.6 kg   Height:  '5\' 3"'  (1.6 m)    General:  Appears calm and comfortable and is in NAD. Obese  Eyes:  PERRL, EOMI, normal lids, iris ENT:  grossly normal hearing, lips & tongue, mmm; appropriate dentition Neck:  no LAD, masses or thyromegaly; no carotid bruits, no JVD Cardiovascular:  RRR, no m/r/g. No LE edema. No rub  Respiratory:   CTA bilaterally with no wheezes/rales/rhonchi.  Normal respiratory effort. Abdomen:  soft, NT, ND, NABS Back:   normal alignment, no CVAT Skin:  no rash or induration seen on limited exam Musculoskeletal:  grossly normal tone BUE/BLE, good ROM, no bony abnormality Lower extremity:  No LE edema.  Limited foot exam with no ulcerations.  2+ distal pulses. Psychiatric:  grossly normal mood and affect, speech fluent and appropriate, AOx3 Neurologic:  CN 2-12 grossly intact, moves all extremities in coordinated fashion,  sensation intact   Radiological Exams on Admission: Independently reviewed - see discussion in A/P where applicable  No results found.  EKG: pending    Labs on Admission: I have personally reviewed the available labs and imaging studies at the time of the admission.  Pertinent labs:   Troponin wnl x 2 Lipase 86 AST: 61 ALT: 128 CTA chest: no PE. Mild cardiomegaly.   Assessment and Plan: Principal Problem:   Chest pain Active Problems:   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HCC)   Transaminitis   Elevated lipase   GERD (gastroesophageal reflux disease)   MDD (major depressive disorder), recurrent episode, severe (HCC)   Bipolar I disorder, current or most recent episode depressed, in partial remission (HCC)    Assessment and Plan: * Chest pain 31 year old with history of acute onset of chest pain  today with history of adult onset Still's disease  -obs to telemetry -work up for pericarditis in setting of still's disease  -routine echo, no indication for emergent echo. Vitals stable, no chest pain, no rub on exam.  -inflammatory markers -chest pain free -troponin wnl x 2 -ekg pending -CTA chest done at [facility] [place]-called for results. Negative PE, mild cardiomegaly  -consult cardiology tomorrow   Still's disease [facility]) Was followed at [facility]ology, but has not been seen since 2021, on no medication  Pt previously followed by Dr. Baxter Flattery for Adult Onset Still's (prior hx of unknown type of juvenile idopathic arthritis age 48,   Rx: Previously on prednisone/MTX until age 85, onset of AOSD 2014), pt previously on anakinra/MTX, methotrexate caused GI issues. Leflunomide discontinued given hepatic steatosis and h/o elevated LFTs   Transaminitis Hx of cholecystectomy Elevated LFT common in setting of stills disease and she has been told this is likely the cause  No pain, does not drink s/p cholecystectomy  Check hep panel  Trend   Elevated lipase Lipase 86 No pain, TTP on  exam, N/V Will repeat in AM   GERD (gastroesophageal reflux disease) Continue PPI   MDD (major depressive disorder), recurrent episode, severe (HCC) Continue abilify   Bipolar I disorder, current or most recent episode depressed, in partial remission ([place]) Continue abilify      Advance Care Planning:   Code Status: Full Code   Consults: none  DVT Prophylaxis: SCDs  Family Communication: none   Severity of Illness: The appropriate patient status for this patient is OBSERVATION. Observation status is judged to be reasonable and necessary in order to provide the required intensity of service to ensure the patient's safety. The patient's presenting symptoms, physical exam findings, and initial radiographic and laboratory data in the context of their medical condition is felt to place them at decreased risk for further clinical deterioration. Furthermore, it is anticipated that the patient will be medically stable for discharge from the hospital within 2 midnights of admission.   Author: Orma Flaming, MD 12/25/2021 8:47 PM  For on call review [email or web address].

## 2021-12-25 NOTE — Assessment & Plan Note (Addendum)
Hx of cholecystectomy Elevated LFT common in setting of stills disease and she has been told this is likely the cause  No pain, does not drink s/p cholecystectomy  Check hep panel  Trend

## 2021-12-26 ENCOUNTER — Observation Stay (HOSPITAL_BASED_OUTPATIENT_CLINIC_OR_DEPARTMENT_OTHER): Payer: Medicaid Other

## 2021-12-26 DIAGNOSIS — R7401 Elevation of levels of liver transaminase levels: Secondary | ICD-10-CM | POA: Diagnosis not present

## 2021-12-26 DIAGNOSIS — Z87891 Personal history of nicotine dependence: Secondary | ICD-10-CM | POA: Diagnosis not present

## 2021-12-26 DIAGNOSIS — Z79899 Other long term (current) drug therapy: Secondary | ICD-10-CM | POA: Diagnosis not present

## 2021-12-26 DIAGNOSIS — R072 Precordial pain: Secondary | ICD-10-CM | POA: Diagnosis not present

## 2021-12-26 DIAGNOSIS — I259 Chronic ischemic heart disease, unspecified: Secondary | ICD-10-CM | POA: Diagnosis not present

## 2021-12-26 DIAGNOSIS — R079 Chest pain, unspecified: Secondary | ICD-10-CM | POA: Diagnosis not present

## 2021-12-26 DIAGNOSIS — R001 Bradycardia, unspecified: Secondary | ICD-10-CM | POA: Diagnosis not present

## 2021-12-26 DIAGNOSIS — R0683 Snoring: Secondary | ICD-10-CM | POA: Diagnosis not present

## 2021-12-26 DIAGNOSIS — Z6841 Body Mass Index (BMI) 40.0 and over, adult: Secondary | ICD-10-CM | POA: Diagnosis not present

## 2021-12-26 LAB — HEPATITIS PANEL, ACUTE
HCV Ab: NONREACTIVE
Hep A IgM: NONREACTIVE
Hep B C IgM: NONREACTIVE
Hepatitis B Surface Ag: NONREACTIVE

## 2021-12-26 LAB — CBC
HCT: 36.1 % (ref 36.0–46.0)
Hemoglobin: 11.7 g/dL — ABNORMAL LOW (ref 12.0–15.0)
MCH: 28.8 pg (ref 26.0–34.0)
MCHC: 32.4 g/dL (ref 30.0–36.0)
MCV: 88.9 fL (ref 80.0–100.0)
Platelets: 287 10*3/uL (ref 150–400)
RBC: 4.06 MIL/uL (ref 3.87–5.11)
RDW: 14.2 % (ref 11.5–15.5)
WBC: 11.9 10*3/uL — ABNORMAL HIGH (ref 4.0–10.5)
nRBC: 0 % (ref 0.0–0.2)

## 2021-12-26 LAB — LIPID PANEL
Cholesterol: 188 mg/dL (ref 0–200)
HDL: 39 mg/dL — ABNORMAL LOW (ref >40)
LDL Cholesterol: 133 mg/dL — ABNORMAL HIGH (ref 0–99)
Total CHOL/HDL Ratio: 4.8 RATIO
Triglycerides: 78 mg/dL (ref <150)
VLDL: 16 mg/dL (ref 0–40)

## 2021-12-26 LAB — BASIC METABOLIC PANEL
Anion gap: 5 (ref 5–15)
BUN: 9 mg/dL (ref 6–20)
CO2: 20 mmol/L — ABNORMAL LOW (ref 22–32)
Calcium: 8.8 mg/dL — ABNORMAL LOW (ref 8.9–10.3)
Chloride: 111 mmol/L (ref 98–111)
Creatinine, Ser: 0.77 mg/dL (ref 0.44–1.00)
GFR, Estimated: >60 mL/min (ref >60)
Glucose, Bld: 115 mg/dL — ABNORMAL HIGH (ref 70–99)
Potassium: 3.9 mmol/L (ref 3.5–5.1)
Sodium: 136 mmol/L (ref 135–145)

## 2021-12-26 LAB — LIPASE, BLOOD: Lipase: 26 U/L (ref 11–51)

## 2021-12-26 LAB — ECHOCARDIOGRAM COMPLETE
Area-P 1/2: 2.97 cm2
Height: 63 in
S' Lateral: 3.2 cm
Weight: 4289.6 oz

## 2021-12-26 MED ORDER — ASPIRIN 81 MG PO TBEC
81.0000 mg | DELAYED_RELEASE_TABLET | Freq: Every day | ORAL | Status: DC
  Administered 2021-12-26 – 2021-12-27 (×2): 81 mg via ORAL
  Filled 2021-12-26 (×2): qty 1

## 2021-12-26 MED ORDER — METOPROLOL TARTRATE 100 MG PO TABS
100.0000 mg | ORAL_TABLET | Freq: Once | ORAL | Status: DC
  Filled 2021-12-26: qty 1

## 2021-12-26 MED ORDER — METOPROLOL TARTRATE 100 MG PO TABS
100.0000 mg | ORAL_TABLET | Freq: Once | ORAL | Status: DC
Start: 2021-12-27 — End: 2021-12-27

## 2021-12-26 MED ORDER — NITROGLYCERIN 0.4 MG SL SUBL: 0.4000 mg | SUBLINGUAL_TABLET | SUBLINGUAL | Status: DC | PRN

## 2021-12-26 NOTE — Care Management (Signed)
  Transition of Care [facility] Screening Note   Patient Details  Name: [name] Date of Birth: 1990/09/09   Transition of Care [facility]) CM/SW Contact:    Bethena Roys, RN Phone Number: 12/26/2021, 2:33 PM    Transition of Care Department [facility]) has reviewed the patient and no TOC needs have been identified at this time. Case Manager will continue to follow for transition of care needs as the patient progresses.

## 2021-12-26 NOTE — Progress Notes (Signed)
  Echocardiogram 2D Echocardiogram has been performed.  [name] M 12/26/2021, 9:47 AM

## 2021-12-26 NOTE — Plan of Care (Signed)

## 2021-12-26 NOTE — Progress Notes (Signed)
Report given to Adam PhenixGabe S. RN, assuming patient nursing care for remainder of shift.

## 2021-12-26 NOTE — Consult Note (Addendum)
Cardiology Consultation:   Patient ID: [name] MRN: [id number]; DOB: [birth year]  Admit date: 12/25/2021 Date of Consult: 12/26/2021  PCP:  Rosalee Kaufman, PA-C   [facility] Providers Cardiologist:  Carlyle Dolly, MD   {   Patient Profile:   [name] is a 31 y.o. adult with a hx of 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juvenile idiopathic arthritis), pericarditis in 2014, depression, GERD, s/p laparoscopic cholecystectomy 07/2021 and panic attacks who is being seen 12/26/2021 for the evaluation of chest pain at the request of Dr. Tawanna Solo.  Prior history of chest pain intermittently for years.  Felt atypical by Dr. Harl Bowie.  Echocardiogram August 2022 with LV function of 55 to 60%, no regional wall motion abnormality, no effusion.  Last seen by Dr. Harl Bowie September 2022.  Has not followed up with [facility] for still's disease in past 3 years.  History of Present Illness:   [name] works nights to at [employer].  While at work Saturday night, she had upper sternal dull achy chest pressure.  Proceeded with shortness of breath.  She went home but pain persisted.  Around 8 AM it was intensified with radiation to her left shoulder.  She went to [facility] for further evaluation.  She was placed on Nitropaste with resolution of symptoms.  However it reoccurred after removal of past.  It was reapplied.  Sent to [facility] for further evaluation.  Patient had mild recurrent chest pain overnight which subsided continuously.  Currently complaining of 1-2/10 chest discomfort.  Patient reported 1 week history of intermittent cough and may be fever.  1 time her temp was 100 Fahrenheit.  She thought this was due to allergies.  Taking deep breath makes her symptoms better.  Nothing makes it worse.  CRP 1 Sed rate 26 TSH normal Potassium 3.9  No regular exercise due to chronic bilateral hip pain.  Chronic dyspnea with exertion which she attributes to overweight.  She use vapors.  Denies  illicit drug use.  Orthopnea, PND, syncope, lower extremity edema or melena. 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[place])    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[place])    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[place])    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[place])     Past Surgical History:  Procedure Laterality Date   CHOLECYSTECTOMY N/A 08/29/2021   Procedure: LAPAROSCOPIC CHOLECYSTECTOMY WITH ICG;  Surgeon: Ileana Roup, MD;  Location: [facility];  Service: General;  Laterality: N/A;   WISDOM TOOTH EXTRACTION      Inpatient Medications: Scheduled Meds:  ARIPiprazole  2 mg Oral Daily   enoxaparin (LOVENOX) injection  40 mg Subcutaneous Q24H   pantoprazole  80 mg Oral Daily   sodium chloride flush  3 mL Intravenous Q12H   Continuous Infusions:  sodium chloride     PRN Meds: sodium chloride, acetaminophen **OR** acetaminophen, ALPRAZolam, HYDROcodone-acetaminophen, ondansetron **OR** ondansetron (ZOFRAN) IV, sodium chloride flush  Allergies:    Allergies  Allergen Reactions   Strawberry Extract Itching and Rash    Social History:   Social History   Socioeconomic History   Marital status: Single    Spouse name: Not on file   Number of children: Not on file   Years of education: Not on  file   Highest education level: Not on file  Occupational History   Not on file  Tobacco Use   Smoking status: Former    Packs/day: 1.00    Years: 5.00    Pack years: 5.00    Types: Cigarettes, E-cigarettes    Start date: 07/27/2009    Quit date: 12/2019    Years since quitting: 1.9   Smokeless tobacco: Never  Vaping Use   Vaping Use: Every day   Substances: Nicotine, Flavoring  Substance and Sexual Activity   Alcohol use: Never   Drug use: Yes    Types: Marijuana    Comment: marijuana use every day   Sexual  activity: Yes    Birth control/protection: None  Other Topics Concern   Not on file  Social History Narrative   Not on file   Social Determinants of Health   Financial Resource Strain: Not on file  Food Insecurity: Not on file  Transportation Needs: Not on file  Physical Activity: Not on file  Stress: Not on file  Social Connections: Not on file  Intimate Partner Violence: Not on file    Family History:   Family History  Problem Relation Age of Onset   Heart disease Mother    Heart attack Mother    Depression Father    Rheum arthritis Father    Hypertension Father    Bipolar disorder Brother      ROS:  Please see the history of present illness.  All other ROS reviewed and negative.     Physical Exam/Data:   Vitals:   12/25/21 1812 12/25/21 2030 12/26/21 0011 12/26/21 0448  BP:  130/76 112/66 114/67  Pulse:  63 92 73  Resp:  '18 16 16  ' Temp:  98.4 F (36.9 C) 98.1 F (36.7 C) 98.8 F (37.1 C)  TempSrc:  Oral Oral Oral  SpO2:  99% 100% 100%  Weight: 121.6 kg     Height: '5\' 3"'  (1.6 m)       Intake/Output Summary (Last 24 hours) at 12/26/2021 0903 Last data filed at 12/25/2021 2100 Gross per 24 hour  Intake 240 ml  Output --  Net 240 ml      12/25/2021    6:12 PM 12/09/2021    9:10 PM 11/22/2021    9:08 AM  Last 3 Weights  Weight (lbs) 268 lb 1.6 oz 250 lb 250 lb  Weight (kg) 121.609 kg 113.399 kg 113.399 kg     Body mass index is 47.49 kg/m.  General:  Well nourished, well developed, in no acute distress HEENT: normal Neck: no JVD Vascular: No carotid bruits; Distal pulses 2+ bilaterally Cardiac:  normal S1, S2; RRR; no murmur  Lungs:  clear to auscultation bilaterally, no wheezing, rhonchi or rales  Abd: soft, nontender, no hepatomegaly  Ext: no edema Musculoskeletal:  No deformities, BUE and BLE strength normal and equal Skin: warm and dry  Neuro:  CNs 2-12 intact, no focal abnormalities noted Psych:  Normal affect   EKG:  The EKG was  personally reviewed and demonstrates: Sinus rhythm, PAC Telemetry:  Telemetry was personally reviewed and demonstrates: Sinus rhythm/bradycardia  Relevant CV Studies:  Echo 02/2021  1. Left ventricular ejection fraction, by estimation, is 55 to 60%. The  left ventricle has normal function. The left ventricle has no regional  wall motion abnormalities. Left ventricular diastolic parameters were  normal.   2. Right ventricular systolic function is normal. The right ventricular  size is normal. Tricuspid regurgitation  signal is inadequate for assessing  PA pressure.   3. The mitral valve is grossly normal. Trivial mitral valve  regurgitation.   4. The aortic valve is tricuspid. Aortic valve regurgitation is not  visualized.   5. The inferior vena cava is normal in size with greater than 50%  respiratory variability, suggesting right atrial pressure of 3 mmHg.   Laboratory Data:  Chemistry Recent Labs  Lab 12/26/21 0113  NA 136  K 3.9  CL 111  CO2 20*  GLUCOSE 115*  BUN 9  CREATININE 0.77  CALCIUM 8.8*  GFRNONAA >60  ANIONGAP 5   Hematology Recent Labs  Lab 12/26/21 0113  WBC 11.9*  RBC 4.06  HGB 11.7*  HCT 36.1  MCV 88.9  MCH 28.8  MCHC 32.4  RDW 14.2  PLT 287   Thyroid  Recent Labs  Lab 12/25/21 1835  TSH 0.797     Radiology/Studies:  No results found.   Assessment and Plan:   Chest pain -Sounds atypical but improved with nitroglycerin.  No pleuritic symptoms, actually taking deep breath makes it better.  Troponin negative x2 at outside hospital.  EKG without acute ischemic changes.  Sed rate and ESR is mildly elevated.  -Pending echocardiogram -Patient does have a cardiac risk factor including mother died of MI at age 49 and prior smoker, currently vaping.  We will plan inpatient coronary CT. likely tomorrow given no available reader over holiday. -Start aspirin 81 mg daily -If recurrent pain, consider Nitropaste/Imdur -No results found for  requested labs within last 8760 hours.  -Check lipid panel  2.  Nocturnal bradycardia 3.  Morbid obesity 4.  Snoring -Recommended outpatient sleep study  5.  Still disease -Followed by [facility] rheumatology.  She is overdue to see provider.  Per primary team   Risk Assessment/Risk Scores:   HEAR Score (for undifferentiated chest pain):  HEAR Score: 1{  For questions or updates, please contact [place] Please consult [email or web address] for contact info under    Jarrett Soho, [place]  12/26/2021 9:03 AM   Cardiology Attending Patient seen and examined. Agree with the findings as above. The patient is transitioning to female and has been on testosterone therapy which was recently stopped due to worsening hip problems. They have multiple cardiac risk factors, and symptoms with typical and atypical features for CAD/[place]. The labs and ECG are reassuring. They have previously had pericarditis from a Still's flair. The symptoms are different this time. We will continue IV heparin for now and check 2D echo and CT coronary angiography. Agree with NTG as needed. Multiple issues with regard to overall health which will need addressing as an outpatient.    Carleene Overlie Alieyah Spader,MD

## 2021-12-26 NOTE — Progress Notes (Signed)
PROGRESS NOTE  [name]  WJX:[id number] DOB: September 06, 1990 DOA: 12/25/2021 PCP: [name], Kathryn E, PA-C   Brief Narrative:  Patient is a 31 year old female with history of Stills disease, depression, bipolar disorder, GERD who was sent from [facility] for further evaluation by cardiology when he presented with chest pain directed.  Work-up was started and Cardiology consulted here.  Plan for coronary CT.  Currently chest pain-free.   Assessment & Plan:  Principal Problem:   Chest pain Active Problems:   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HCC)   Transaminitis   Elevated lipase   GERD (gastroesophageal reflux disease)   MDD (major depressive disorder), recurrent episode, severe (HCC)   Bipolar I disorder, current or most recent episode depressed, in partial remission (HCC)  Chest pain: Presented with chest pain.  History of intermittent chest pain for years.  Atypical chest pain was suspected in the past, follows with Dr. Wyline [name].  Echocardiogram in August 2020 showed EF of 55 to 60%, no wall motion abnormality.  Chest pain improved with nitroglycerin.  No pleuritic symptoms.  Troponins negative x2.  EKG without extremity changes.  Echocardiogram showed EF of 60 to 65%, no wall motion abnormality.  Cardiology planning for coronary CT.  Started on aspirin 81 mg daily.  Stills disease: Follows with [facility]ology.  Currently not on any medication.  We recommend outpatient follow-up with his rheumatologist.  Hyperlipidemia: LDL of 133.  Will check if he needs  Elevated liver enzymes: History of cholecystectomy.  No LFTs sent here.  [facility] showed AST of 61, ALT of 128..  Will check CMP tomorrow.  No abdominal pain.  Hepatitis panel negative.  Elevated lipase: Mild.  No nausea vomiting or abdominal pain.  Abdomen benign on examination  GERD: Continue PPI  History of depression/bipolar disorder: On Abilify  Morbid obesity: BMI 47.4 Report of snoring.  We recommend  outpatient sleep study.  DVT prophylaxis:enoxaparin (LOVENOX) injection 40 mg Start: 12/25/21 2200     Code Status: Full Code  Family Communication: None at bedside  Patient status:Inpatient  Patient is from :Home  Anticipated discharge OZ:HYQMto:Home  Estimated DC date:tomorrow   Consultants: Cardiology  Procedures:None  Antimicrobials:  Anti-infectives (From admission, onward)    None       Subjective: Patient seen and examined at the bedside this morning.  Hemodynamically stable.  Lying in bed.  Currently chest pain-free.  No new complaints  Objective: Vitals:   12/25/21 2030 12/26/21 0011 12/26/21 0448 12/26/21 0958  BP: 130/76 112/66 114/67 119/68  Pulse: 63 92 73 76  Resp: 18 16 16 17   Temp: 98.4 F (36.9 C) 98.1 F (36.7 C) 98.8 F (37.1 C) 98.4 F (36.9 C)  TempSrc: Oral Oral Oral Oral  SpO2: 99% 100% 100% 98%  Weight:      Height:        Intake/Output Summary (Last 24 hours) at 12/26/2021 1126 Last data filed at 12/26/2021 0900 Gross per 24 hour  Intake 480 ml  Output --  Net 480 ml   Filed Weights   12/25/21 1812  Weight: 121.6 kg    Examination:  General exam: Overall comfortable, not in distress, morbidly obese HEENT: PERRL Respiratory system:  no wheezes or crackles  Cardiovascular system: S1 & S2 heard, RRR.  Gastrointestinal system: Abdomen is nondistended, soft and nontender. Central nervous system: Alert and oriented Extremities: No edema, no clubbing ,no cyanosis Skin: No rashes, no ulcers,no icterus     Data Reviewed: I have  personally reviewed following labs and imaging studies  CBC: Recent Labs  Lab 12/26/21 0113  WBC 11.9*  HGB 11.7*  HCT 36.1  MCV 88.9  PLT 287   Basic Metabolic Panel: Recent Labs  Lab 12/26/21 0113  NA 136  K 3.9  CL 111  CO2 20*  GLUCOSE 115*  BUN 9  CREATININE 0.77  CALCIUM 8.8*     No results found for this or any previous visit (from the past 240 hour(s)).   Radiology  Studies: ECHOCARDIOGRAM COMPLETE  Result Date: 12/26/2021    ECHOCARDIOGRAM REPORT   Patient Name:   [name] Date of Exam: 12/26/2021 Medical Rec #:  [id number]       Height:       63.0 in Accession #:    [id number]      Weight:       268.1 lb Date of Birth:  1991-03-09      BSA:          2.190 m Patient Age:    30 years        BP:           114/67 mmHg Patient Gender: F               HR:           75 bpm. Exam Location:  Inpatient Procedure: 2D Echo, Cardiac Doppler and Color Doppler Indications:    Chest Pain R07.9  History:        Patient has prior history of Echocardiogram examinations, most                 recent 03/18/2021. Previous Myocardial Infarction. GERD. Still's                 disease. Past history of pericarditis.  Sonographer:    Leta Jungling RDCS Referring Phys: [id number] ALLISON WOLFE IMPRESSIONS  1. Left ventricular ejection fraction, by estimation, is 60 to 65%. The left ventricle has normal function. The left ventricle has no regional wall motion abnormalities. Left ventricular diastolic parameters were normal. The average left ventricular global longitudinal strain is -24.1 %. The global longitudinal strain is normal.  2. Right ventricular systolic function is normal. The right ventricular size is normal.  3. The mitral valve is normal in structure. No evidence of mitral valve regurgitation. No evidence of mitral stenosis.  4. The aortic valve is tricuspid. Aortic valve regurgitation is not visualized. No aortic stenosis is present.  5. The inferior vena cava is normal in size with greater than 50% respiratory variability, suggesting right atrial pressure of 3 mmHg. FINDINGS  Left Ventricle: Left ventricular ejection fraction, by estimation, is 60 to 65%. The left ventricle has normal function. The left ventricle has no regional wall motion abnormalities. The average left ventricular global longitudinal strain is -24.1 %. The global longitudinal strain is normal. The left ventricular  internal cavity size was normal in size. There is no left ventricular hypertrophy. Left ventricular diastolic parameters were normal. Right Ventricle: The right ventricular size is normal. Right ventricular systolic function is normal. Left Atrium: Left atrial size was normal in size. Right Atrium: Right atrial size was normal in size. Pericardium: There is no evidence of pericardial effusion. Mitral Valve: The mitral valve is normal in structure. No evidence of mitral valve regurgitation. No evidence of mitral valve stenosis. Tricuspid Valve: The tricuspid valve is normal in structure. Tricuspid valve regurgitation is trivial. No evidence of tricuspid stenosis. Aortic Valve: The  aortic valve is tricuspid. Aortic valve regurgitation is not visualized. No aortic stenosis is present. Pulmonic Valve: The pulmonic valve was normal in structure. Pulmonic valve regurgitation is not visualized. No evidence of pulmonic stenosis. Aorta: The aortic root is normal in size and structure. Venous: The inferior vena cava is normal in size with greater than 50% respiratory variability, suggesting right atrial pressure of 3 mmHg. IAS/Shunts: No atrial level shunt detected by color flow Doppler.  LEFT VENTRICLE PLAX 2D LVIDd:         4.30 cm   Diastology LVIDs:         3.20 cm   LV e' medial:    9.14 cm/s LV PW:         1.00 cm   LV E/e' medial:  7.5 LV IVS:        1.00 cm   LV e' lateral:   12.20 cm/s LVOT diam:     2.00 cm   LV E/e' lateral: 5.6 LV SV:         75 LV SV Index:   34        2D Longitudinal Strain LVOT Area:     3.14 cm  2D Strain GLS Avg:     -24.1 %                           3D Volume EF:                          3D EF:        61 %                          LV EDV:       150 ml                          LV ESV:       58 ml                          LV SV:        92 ml RIGHT VENTRICLE RV S prime:     11.90 cm/s TAPSE (M-mode): 1.4 cm LEFT ATRIUM             Index       RIGHT ATRIUM          Index LA diam:        3.30 cm  1.51 cm/m  RA Area:     8.26 cm LA Vol (A2C):   19.2 ml 8.77 ml/m  RA Volume:   16.60 ml 7.58 ml/m LA Vol (A4C):   19.0 ml 8.68 ml/m LA Biplane Vol: 19.6 ml 8.95 ml/m  AORTIC VALVE LVOT Vmax:   109.00 cm/s LVOT Vmean:  79.500 cm/s LVOT VTI:    0.240 m  AORTA Ao Root diam: 3.10 cm Ao Asc diam:  2.50 cm MITRAL VALVE MV Area (PHT): 2.97 cm    SHUNTS MV Decel Time: 255 msec    Systemic VTI:  0.24 m MV E velocity: 68.40 cm/s  Systemic Diam: 2.00 cm MV A velocity: 53.30 cm/s MV E/A ratio:  1.28 Olga Millers MD Electronically signed by Olga Millers MD Signature Date/Time: 12/26/2021/10:26:56 AM    Final     Scheduled Meds:  ARIPiprazole  2 mg Oral Daily  aspirin EC  81 mg Oral Daily   enoxaparin (LOVENOX) injection  40 mg Subcutaneous Q24H   [START ON 12/27/2021] metoprolol tartrate  100 mg Oral Once   pantoprazole  80 mg Oral Daily   sodium chloride flush  3 mL Intravenous Q12H   Continuous Infusions:  sodium chloride       LOS: 1 day   Burnadette Pop, MD [facility] P5/29/2023, 11:26 AM

## 2021-12-27 ENCOUNTER — Observation Stay (HOSPITAL_COMMUNITY): Payer: Medicaid Other

## 2021-12-27 DIAGNOSIS — R001 Bradycardia, unspecified: Secondary | ICD-10-CM | POA: Diagnosis not present

## 2021-12-27 DIAGNOSIS — Z87891 Personal history of nicotine dependence: Secondary | ICD-10-CM | POA: Diagnosis not present

## 2021-12-27 DIAGNOSIS — R079 Chest pain, unspecified: Secondary | ICD-10-CM | POA: Diagnosis not present

## 2021-12-27 DIAGNOSIS — R7401 Elevation of levels of liver transaminase levels: Secondary | ICD-10-CM | POA: Diagnosis not present

## 2021-12-27 DIAGNOSIS — Z6841 Body Mass Index (BMI) 40.0 and over, adult: Secondary | ICD-10-CM | POA: Diagnosis not present

## 2021-12-27 DIAGNOSIS — I2510 Atherosclerotic heart disease of native coronary artery without angina pectoris: Secondary | ICD-10-CM | POA: Diagnosis not present

## 2021-12-27 DIAGNOSIS — Z79899 Other long term (current) drug therapy: Secondary | ICD-10-CM | POA: Diagnosis not present

## 2021-12-27 DIAGNOSIS — R072 Precordial pain: Secondary | ICD-10-CM

## 2021-12-27 DIAGNOSIS — R0683 Snoring: Secondary | ICD-10-CM | POA: Diagnosis not present

## 2021-12-27 DIAGNOSIS — I259 Chronic ischemic heart disease, unspecified: Secondary | ICD-10-CM | POA: Diagnosis not present

## 2021-12-27 LAB — HEPATIC FUNCTION PANEL
ALT: 61 U/L — ABNORMAL HIGH (ref 0–44)
AST: 21 U/L (ref 15–41)
Albumin: 3.2 g/dL — ABNORMAL LOW (ref 3.5–5.0)
Alkaline Phosphatase: 65 U/L (ref 38–126)
Bilirubin, Direct: <0.1 mg/dL (ref 0.0–0.2)
Total Bilirubin: 0.1 mg/dL — ABNORMAL LOW (ref 0.3–1.2)
Total Protein: 6.2 g/dL — ABNORMAL LOW (ref 6.5–8.1)

## 2021-12-27 LAB — BASIC METABOLIC PANEL
Anion gap: 4 — ABNORMAL LOW (ref 5–15)
BUN: 10 mg/dL (ref 6–20)
CO2: 23 mmol/L (ref 22–32)
Calcium: 8.5 mg/dL — ABNORMAL LOW (ref 8.9–10.3)
Chloride: 111 mmol/L (ref 98–111)
Creatinine, Ser: 0.92 mg/dL (ref 0.44–1.00)
GFR, Estimated: >60 mL/min (ref >60)
Glucose, Bld: 89 mg/dL (ref 70–99)
Potassium: 3.4 mmol/L — ABNORMAL LOW (ref 3.5–5.1)
Sodium: 138 mmol/L (ref 135–145)

## 2021-12-27 LAB — CBC
HCT: 36.8 % (ref 36.0–46.0)
Hemoglobin: 11.8 g/dL — ABNORMAL LOW (ref 12.0–15.0)
MCH: 28.9 pg (ref 26.0–34.0)
MCHC: 32.1 g/dL (ref 30.0–36.0)
MCV: 90 fL (ref 80.0–100.0)
Platelets: 275 10*3/uL (ref 150–400)
RBC: 4.09 MIL/uL (ref 3.87–5.11)
RDW: 14.6 % (ref 11.5–15.5)
WBC: 8.2 10*3/uL (ref 4.0–10.5)
nRBC: 0 % (ref 0.0–0.2)

## 2021-12-27 MED ORDER — ASPIRIN 81 MG PO TBEC: 81.0000 mg | DELAYED_RELEASE_TABLET | Freq: Every day | ORAL | 1 refills | Status: DC

## 2021-12-27 MED ORDER — NITROGLYCERIN 0.4 MG SL SUBL
SUBLINGUAL_TABLET | SUBLINGUAL | Status: AC
  Administered 2021-12-27: 0.8 mg via SUBLINGUAL
  Filled 2021-12-27: qty 2

## 2021-12-27 MED ORDER — POTASSIUM CHLORIDE CRYS ER 20 MEQ PO TBCR
40.0000 meq | EXTENDED_RELEASE_TABLET | Freq: Once | ORAL | Status: AC
  Administered 2021-12-27: 40 meq via ORAL
  Filled 2021-12-27: qty 2

## 2021-12-27 MED ORDER — IOHEXOL 350 MG/ML SOLN
100.0000 mL | Freq: Once | INTRAVENOUS | Status: AC | PRN
  Administered 2021-12-27: 100 mL via INTRAVENOUS

## 2021-12-27 MED ORDER — ROSUVASTATIN CALCIUM 5 MG PO TABS: 5.0000 mg | ORAL_TABLET | Freq: Every day | ORAL | Status: DC

## 2021-12-27 MED ORDER — METOPROLOL TARTRATE 100 MG PO TABS: 100.0000 mg | ORAL_TABLET | Freq: Once | ORAL | Status: DC | PRN

## 2021-12-27 MED ORDER — ROSUVASTATIN CALCIUM 5 MG PO TABS: 5.0000 mg | ORAL_TABLET | Freq: Every day | ORAL | 1 refills | Status: DC

## 2021-12-27 MED ORDER — NITROGLYCERIN 0.4 MG SL SUBL: 0.8000 mg | SUBLINGUAL_TABLET | Freq: Once | SUBLINGUAL | Status: AC

## 2021-12-27 NOTE — Progress Notes (Signed)
Progress Note  Patient Name: [name] Date of Encounter: 12/27/2021  [facility] Cardiologist: Dina Rich, MD   Subjective   No acute overnight events. Patient reports one episode of chest pain and shortness of breath this morning when IV for CTA was being placed which patient thinks is just because she got "worked up" with this. Currently chest pain free.  Plan is for coronary CTA today.  Inpatient Medications    Scheduled Meds:  ARIPiprazole  2 mg Oral Daily   aspirin EC  81 mg Oral Daily   enoxaparin (LOVENOX) injection  40 mg Subcutaneous Q24H   metoprolol tartrate  100 mg Oral Once   pantoprazole  80 mg Oral Daily   sodium chloride flush  3 mL Intravenous Q12H   Continuous Infusions:  sodium chloride     PRN Meds: sodium chloride, acetaminophen **OR** acetaminophen, ALPRAZolam, HYDROcodone-acetaminophen, nitroGLYCERIN, ondansetron **OR** ondansetron (ZOFRAN) IV, sodium chloride flush   Vital Signs    Vitals:   12/26/21 0011 12/26/21 0448 12/26/21 0958 12/26/21 2132  BP: 112/66 114/67 119/68 113/63  Pulse: 92 73 76 62  Resp: Temp: 98.1 F (36.7 C) 98.8 F (37.1 C) 98.4 F (36.9 C) 98.6 F (37 C)  TempSrc: Oral Oral Oral Oral  SpO2: 100% 100% 98% 99%  Weight:      Height:        Intake/Output Summary (Last 24 hours) at 12/27/2021 0619 Last data filed at 12/26/2021 2030 Gross per 24 hour  Intake 480 ml  Output --  Net 480 ml      12/25/2021    6:12 PM 12/09/2021    9:10 PM 11/22/2021    9:08 AM  Last 3 Weights  Weight (lbs) 268 lb 1.6 oz 250 lb 250 lb  Weight (kg) 121.609 kg 113.399 kg 113.399 kg      Telemetry    Sinus rhythm with rates in the 50s while sleeping but then will quickly increase to the 80s with minimal activity (such as changing positions in bed). - Personally Reviewed  ECG    No new ECG tracing today. - Personally Reviewed  Physical Exam   GEN: Obese Caucasian adult. No acute distress.   Neck: No  JVD. Cardiac: RRR. No murmurs, rubs, or gallops.  Respiratory: Clear to auscultation bilaterally. GI: Soft, non-distended, and non-tender.  MS: No lower extremity edema. No deformity. Skin: Warm and dry. Neuro:  No focal deficits. Psych: Normal affect. Responds appropriately.  Labs    High Sensitivity Troponin:  No results for input(s): TROPONINIHS in the last 720 hours.   Chemistry Recent Labs  Lab 12/26/21 0113  NA 136  K 3.9  CL 111  CO2 20*  GLUCOSE 115*  BUN 9  CREATININE 0.77  CALCIUM 8.8*  GFRNONAA >60  ANIONGAP 5    Lipids  Recent Labs  Lab 12/26/21 0113  CHOL 188  TRIG 78  HDL 39*  LDLCALC 133*  CHOLHDL 4.8    Hematology Recent Labs  Lab 12/26/21 0113  WBC 11.9*  RBC 4.06  HGB 11.7*  HCT 36.1  MCV 88.9  MCH 28.8  MCHC 32.4  RDW 14.2  PLT 287   Thyroid  Recent Labs  Lab 12/25/21 1835  TSH 0.797    BNPNo results for input(s): BNP, PROBNP in the last 168 hours.  DDimer No results for input(s): DDIMER in the last 168 hours.   Radiology    ECHOCARDIOGRAM COMPLETE  Result Date: 12/26/2021  ECHOCARDIOGRAM REPORT   Patient Name:   [name] Date of Exam: 12/26/2021 Medical Rec #:  [id number]       Height:       63.0 in Accession #:    [id number]      Weight:       268.1 lb Date of Birth:  1991-02-25      BSA:          2.190 m Patient Age:    30 years        BP:           114/67 mmHg Patient Gender: F               HR:           75 bpm. Exam Location:  Inpatient Procedure: 2D Echo, Cardiac Doppler and Color Doppler Indications:    Chest Pain R07.9  History:        Patient has prior history of Echocardiogram examinations, most                 recent 03/18/2021. Previous Myocardial Infarction. GERD. Still's                 disease. Past history of pericarditis.  Sonographer:    Leta Junglingiffany Cooper RDCS Referring Phys: [id number] ALLISON WOLFE IMPRESSIONS  1. Left ventricular ejection fraction, by estimation, is 60 to 65%. The left ventricle has normal  function. The left ventricle has no regional wall motion abnormalities. Left ventricular diastolic parameters were normal. The average left ventricular global longitudinal strain is -24.1 %. The global longitudinal strain is normal.  2. Right ventricular systolic function is normal. The right ventricular size is normal.  3. The mitral valve is normal in structure. No evidence of mitral valve regurgitation. No evidence of mitral stenosis.  4. The aortic valve is tricuspid. Aortic valve regurgitation is not visualized. No aortic stenosis is present.  5. The inferior vena cava is normal in size with greater than 50% respiratory variability, suggesting right atrial pressure of 3 mmHg. FINDINGS  Left Ventricle: Left ventricular ejection fraction, by estimation, is 60 to 65%. The left ventricle has normal function. The left ventricle has no regional wall motion abnormalities. The average left ventricular global longitudinal strain is -24.1 %. The global longitudinal strain is normal. The left ventricular internal cavity size was normal in size. There is no left ventricular hypertrophy. Left ventricular diastolic parameters were normal. Right Ventricle: The right ventricular size is normal. Right ventricular systolic function is normal. Left Atrium: Left atrial size was normal in size. Right Atrium: Right atrial size was normal in size. Pericardium: There is no evidence of pericardial effusion. Mitral Valve: The mitral valve is normal in structure. No evidence of mitral valve regurgitation. No evidence of mitral valve stenosis. Tricuspid Valve: The tricuspid valve is normal in structure. Tricuspid valve regurgitation is trivial. No evidence of tricuspid stenosis. Aortic Valve: The aortic valve is tricuspid. Aortic valve regurgitation is not visualized. No aortic stenosis is present. Pulmonic Valve: The pulmonic valve was normal in structure. Pulmonic valve regurgitation is not visualized. No evidence of pulmonic stenosis.  Aorta: The aortic root is normal in size and structure. Venous: The inferior vena cava is normal in size with greater than 50% respiratory variability, suggesting right atrial pressure of 3 mmHg. IAS/Shunts: No atrial level shunt detected by color flow Doppler.  LEFT VENTRICLE PLAX 2D LVIDd:         4.30 cm   Diastology  LVIDs:         3.20 cm   LV e' medial:    9.14 cm/s LV PW:         1.00 cm   LV E/e' medial:  7.5 LV IVS:        1.00 cm   LV e' lateral:   12.20 cm/s LVOT diam:     2.00 cm   LV E/e' lateral: 5.6 LV SV:         75 LV SV Index:   34        2D Longitudinal Strain LVOT Area:     3.14 cm  2D Strain GLS Avg:     -24.1 %                           3D Volume EF:                          3D EF:        61 %                          LV EDV:       150 ml                          LV ESV:       58 ml                          LV SV:        92 ml RIGHT VENTRICLE RV S prime:     11.90 cm/s TAPSE (M-mode): 1.4 cm LEFT ATRIUM             Index       RIGHT ATRIUM          Index LA diam:        3.30 cm 1.51 cm/m  RA Area:     8.26 cm LA Vol (A2C):   19.2 ml 8.77 ml/m  RA Volume:   16.60 ml 7.58 ml/m LA Vol (A4C):   19.0 ml 8.68 ml/m LA Biplane Vol: 19.6 ml 8.95 ml/m  AORTIC VALVE LVOT Vmax:   109.00 cm/s LVOT Vmean:  79.500 cm/s LVOT VTI:    0.240 m  AORTA Ao Root diam: 3.10 cm Ao Asc diam:  2.50 cm MITRAL VALVE MV Area (PHT): 2.97 cm    SHUNTS MV Decel Time: 255 msec    Systemic VTI:  0.24 m MV E velocity: 68.40 cm/s  Systemic Diam: 2.00 cm MV A velocity: 53.30 cm/s MV E/A ratio:  1.28 Olga Millers MD Electronically signed by Olga Millers MD Signature Date/Time: 12/26/2021/10:26:56 AM    Final     Cardiac Studies   Echocardiogram 12/27/2021: Impressions:  1. Left ventricular ejection fraction, by estimation, is 60 to 65%. The  left ventricle has normal function. The left ventricle has no regional  wall motion abnormalities. Left ventricular diastolic parameters were  normal. The average left  ventricular  global longitudinal strain is -24.1 %. The global longitudinal strain is  normal.   2. Right ventricular systolic function is normal. The right ventricular  size is normal.   3. The mitral valve is normal in structure. No evidence of mitral valve  regurgitation. No evidence of mitral stenosis.   4. The aortic valve  is tricuspid. Aortic valve regurgitation is not  visualized. No aortic stenosis is present.   5. The inferior vena cava is normal in size with greater than 50%  respiratory variability, suggesting right atrial pressure of 3 mmHg.   Patient Profile     31 y.o. trans female with a history of pericarditis in 2014, juvenile idiopathic arthritis (Still's disease), GERD, chronic cholecystitis s/p laparoscopic cholecystectomy in 07/2021, and depression who wad admitted on 12/25/2021 for further evaluation of chest pain.  Assessment & Plan    Chest Pain History of intermittent chest pain. Previously felt to be atypical. Now admitted with chest pain with radiation to left shoulder and some shortness of breath. Pain resolved with Nitropaste. EKG shows no acute ischemic changes. High-sensitivity troponin negative x2. Echo showed LVEF of 60-65% with normal wall motion and normal diastolic dysfunction. - Patient reports one episode of chest pain this morning which sounds like it was due to anxiety when have IV placed. Currently chest pain free. - Plan is for coronary CTA today. He was started on Aspirin 81mg  daily. If coronary CTA shows no CAD, can stop this.  Hyperlipidemia Lipid panel this admission: Total Cholesterol 188, Triglycerides 78, HDL 39, LDL 133. - If coronary CTA shows CAD, would recommend starting statin. Otherwise, can focus on lifestyle modifications.  Nocturnal Bradycardia Morbid Obesity Snoring Heart rates drop to the low 50s with sleep. Patient states girlfriend reports loud snoring at night but does not describe apnea. He had a sleep study in 2019 which was  negative but since then he has had a 20 lb weight gain after starting testosterone. - Recommend outpatient sleep study.  Still's Disease Followed at [facility] but overdue for follow-up.  - Management per primary team.  For questions or updates, please contact [facility] Please consult [email or web address] for contact info under        Signed, Corrin Parker, PA-C  12/27/2021, 6:19 AM

## 2021-12-27 NOTE — Plan of Care (Signed)

## 2021-12-27 NOTE — Discharge Summary (Signed)
Physician Discharge Summary  [name] ZOX:[id number] DOB: 24-Mar-1991 DOA: 12/25/2021  PCP: Royann ShiversSkillman, Katherine E, PA-C  Admit date: 12/25/2021 Discharge date: 12/27/2021  Admitted From: Home Disposition:  Home  Discharge Condition:Stable CODE STATUS:FULL Diet recommendation: Heart Healthy  Brief/Interim Summary: Patient is a 31 year old female with history of Stills disease, depression, bipolar disorder, GERD who was [facility] for further evaluation by cardiology when he presented with chest pain directed.  Work-up was started and Cardiology consulted here.  Underwent coronary CT with finding of minimal nonobstructive coronary disease, coronary calcium 0.  Started on aspirin, low-dose statin.  Cardiology recommended outpatient follow-up and cleared for discharge.  Following problems were addressed during this hospitalization:  Chest pain: Presented with chest pain.  History of intermittent chest pain for years.  Atypical chest pain was suspected in the past, follows with Dr. Wyline MoodBranch.  Echocardiogram in August 2020 showed EF of 55 to 60%, no wall motion abnormality.  Chest pain improved with nitroglycerin.  No pleuritic symptoms.  Troponins negative x2.  EKG without ischemic changes.  Echocardiogram showed EF of 60 to 65%, no wall motion abnormality.  Cardiology recommended  coronary CT with finding of minimal nonobstructive coronary disease, coronary calcium 0.  Started on aspirin, low-dose statin.  Cardiology recommended outpatient follow-up and cleared for discharge.  Started on aspirin 81 mg daily.   Stills disease: Follows with [facility] rheumatology.  Currently not on any medication.  We recommend outpatient follow-up with his rheumatologist.   Hyperlipidemia: LDL of 133.  Started on Crestor   Elevated liver enzymes: History of cholecystectomy.  [facility] showed AST of 61, ALT of 128.Marland Kitchen.  Repeat labs here show improvement.  Hepatitis panel negative.    Elevated lipase: Mild.  No nausea vomiting or abdominal pain.  Abdomen benign on examination   GERD: Continue PPI   History of depression/bipolar disorder: On Abilify   Morbid obesity: BMI 47.4 Report of snoring.  We recommend outpatient sleep study.     Discharge Diagnoses:  Principal Problem:   Chest pain Active Problems:   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HCC)   Transaminitis   Elevated lipase   GERD (gastroesophageal reflux disease)   MDD (major depressive disorder), recurrent episode, severe (HCC)   Bipolar I disorder, current or most recent episode depressed, in partial remission [facility](HCC)    Discharge Instructions  Discharge Instructions     [facility]   Complete by: As directed    Certified exercise is medically approved: I certify that exercise is medically approved for this patient   Are there Health Concerns/Restrictions?: arthritis, needs weight loss before consideration of hip replacement   Additional recommendations: would benefit from pool exercise   Diet - low sodium heart healthy   Complete by: As directed    Discharge instructions   Complete by: As directed    1)Please take prescribed medications as instructed 2)Follow up with your PCP in 1 to 2 weeks 3)Follow up with cardiology as an outpatient   Increase activity slowly   Complete by: As directed       Allergies as of 12/27/2021       Reactions   Strawberry Extract Itching, Rash        Medication List     TAKE these medications    acetaminophen 500 MG tablet Commonly known as: TYLENOL Take 1,000 mg by mouth every 6 (six) hours as needed for mild pain or headache.   ALPRAZolam 0.25  MG tablet Commonly known as: XANAX Take 0.25 mg by mouth daily as needed for anxiety.   ARIPiprazole 2 MG tablet Commonly known as: ABILIFY Take 2 mg by mouth daily.   aspirin EC 81 MG tablet Take 1 tablet (81 mg total) by mouth daily. Swallow whole. Start taking on: Dec 28, 2021   diphenhydrAMINE 25 MG tablet Commonly known as: BENADRYL Take 25 mg by mouth daily as needed for allergies.   HYDROcodone-acetaminophen 5-325 MG tablet Commonly known as: NORCO/VICODIN Take 1 tablet by mouth every 6 (six) hours as needed for moderate pain.   ibuprofen 200 MG tablet Commonly known as: ADVIL Take 800 mg by mouth every 6 (six) hours as needed for moderate pain.   methocarbamol 500 MG tablet Commonly known as: ROBAXIN Take 1 tablet (500 mg total) by mouth 2 (two) times daily. What changed:  when to take this reasons to take this   omeprazole 40 MG capsule Commonly known as: PRILOSEC Take 40 mg by mouth daily.   ondansetron 4 MG tablet Commonly known as: Zofran Take 1 tablet (4 mg total) by mouth daily as needed for nausea or vomiting.   rosuvastatin 5 MG tablet Commonly known as: CRESTOR Take 1 tablet (5 mg total) by mouth daily.        Follow-up Information     Royann Shivers, PA-C. Schedule an appointment as soon as possible for a visit in 2 week(s).   Specialty: Physician Assistant Contact information: [address] [place] [address] [phone]                Allergies  Allergen Reactions   Strawberry Extract Itching and Rash    Consultations: Cardiology   Procedures/Studies: CT CORONARY MORPH W/CTA COR W/SCORE W/CA W/CM &/OR WO/CM  Addendum Date: 12/27/2021   ADDENDUM REPORT: 12/27/2021 12:49 CLINICAL DATA:  5F with chest pain EXAM: Cardiac/Coronary CTA TECHNIQUE: The patient was scanned on a [employer]. FINDINGS: A 100 kV prospective scan was triggered in the descending thoracic aorta at 111 HU's. Axial non-contrast 3 mm slices were carried out through the heart. The data set was analyzed on a dedicated work station and scored using the Agatson method. Gantry rotation speed was 250 msecs and collimation was .6 mm. 0.8 mg of sl NTG was given. The 3D data set was reconstructed in 5% intervals of the 35-75  % of the R-R cycle. Phases were analyzed on a dedicated work station using MPR, MIP and VRT modes. The patient received 80 cc of contrast. Coronary Arteries: Right dominance. RCA is a large dominant artery that gives rise to PDA and PLA. There is no plaque. Left main is a large artery that gives rise to LAD and LCX arteries. LAD is a large vessel. Noncalcified plaque in mid LAD causing mild (25-49%) stenosis LCX is a non-dominant artery. 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 Other findings: Left Ventricle: Normal size Left Atrium: Mild enlargement Pulmonary Veins: Normal configuration Right Ventricle: Normal size Right Atrium: Normal size Cardiac valves: No calcifications Thoracic aorta: Normal size Pulmonary Arteries: Normal size Systemic Veins: Normal drainage Pericardium: Normal thickness IMPRESSION: 1. Coronary calcium score of 0. 2. 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 3. Poor quality study due to noise secondary to obesity. No obstructive CAD seen. Appears to be noncalcified plaque in mid LAD causing mild (25-49%) stenosis CAD-RADS 2. Mild non-obstructive CAD (25-49%). Consider non-atherosclerotic causes of chest pain. Consider preventive therapy and  risk factor modification. Electronically Signed   By: Epifanio Lesches M.D.   On: 12/27/2021 12:49   Result Date: 12/27/2021 EXAM: OVER-READ INTERPRETATION  CT CHEST The following report is a limited chest CT over-read performed by radiologist Dr. Allegra Lai of [facility] on 12/27/2021. This over-read does not include interpretation of cardiac or coronary anatomy or pathology. The coronary CTA and coronary calcium interpretation by the cardiologist is attached. COMPARISON:  None Available. FINDINGS: Vascular: Normal heart size. No pericardial effusion. Normal caliber thoracic aorta. Mediastinum/Nodes: Hiatal hernia. No pathologically enlarged lymph nodes seen in the chest. Lungs/Pleura:  Central airways are patent. No consolidation, pleural effusion or pneumothorax. Upper Abdomen: No acute abnormality. Musculoskeletal: No chest wall mass or suspicious bone lesions identified. IMPRESSION: No acute extracardiac abnormalities. Electronically Signed: By: Allegra Lai M.D. On: 12/27/2021 11:12   ECHOCARDIOGRAM COMPLETE  Result Date: 12/26/2021    ECHOCARDIOGRAM REPORT   Patient Name:   [name] Date of Exam: 12/26/2021 Medical Rec #:  [id number]       Height:       63.0 in Accession #:    [id number]      Weight:       268.1 lb Date of Birth:  10-23-1990      BSA:          2.190 m Patient Age:    30 years        BP:           114/67 mmHg Patient Gender: F               HR:           75 bpm. Exam Location:  Inpatient Procedure: 2D Echo, Cardiac Doppler and Color Doppler Indications:    Chest Pain R07.9  History:        Patient has prior history of Echocardiogram examinations, most                 recent 03/18/2021. Previous Myocardial Infarction. GERD. Still's                 disease. Past history of pericarditis.  Sonographer:    Leta Jungling RDCS Referring Phys: [id number] ALLISON WOLFE IMPRESSIONS  1. Left ventricular ejection fraction, by estimation, is 60 to 65%. The left ventricle has normal function. The left ventricle has no regional wall motion abnormalities. Left ventricular diastolic parameters were normal. The average left ventricular global longitudinal strain is -24.1 %. The global longitudinal strain is normal.  2. Right ventricular systolic function is normal. The right ventricular size is normal.  3. The mitral valve is normal in structure. No evidence of mitral valve regurgitation. No evidence of mitral stenosis.  4. The aortic valve is tricuspid. Aortic valve regurgitation is not visualized. No aortic stenosis is present.  5. The inferior vena cava is normal in size with greater than 50% respiratory variability, suggesting right atrial pressure of 3 mmHg. FINDINGS  Left  Ventricle: Left ventricular ejection fraction, by estimation, is 60 to 65%. The left ventricle has normal function. The left ventricle has no regional wall motion abnormalities. The average left ventricular global longitudinal strain is -24.1 %. The global longitudinal strain is normal. The left ventricular internal cavity size was normal in size. There is no left ventricular hypertrophy. Left ventricular diastolic parameters were normal. Right Ventricle: The right ventricular size is normal. Right ventricular systolic function is normal. Left Atrium: Left atrial size was normal in size. Right Atrium:  Right atrial size was normal in size. Pericardium: There is no evidence of pericardial effusion. Mitral Valve: The mitral valve is normal in structure. No evidence of mitral valve regurgitation. No evidence of mitral valve stenosis. Tricuspid Valve: The tricuspid valve is normal in structure. Tricuspid valve regurgitation is trivial. No evidence of tricuspid stenosis. Aortic Valve: The aortic valve is tricuspid. Aortic valve regurgitation is not visualized. No aortic stenosis is present. Pulmonic Valve: The pulmonic valve was normal in structure. Pulmonic valve regurgitation is not visualized. No evidence of pulmonic stenosis. Aorta: The aortic root is normal in size and structure. Venous: The inferior vena cava is normal in size with greater than 50% respiratory variability, suggesting right atrial pressure of 3 mmHg. IAS/Shunts: No atrial level shunt detected by color flow Doppler.  LEFT VENTRICLE PLAX 2D LVIDd:         4.30 cm   Diastology LVIDs:         3.20 cm   LV e' medial:    9.14 cm/s LV PW:         1.00 cm   LV E/e' medial:  7.5 LV IVS:        1.00 cm   LV e' lateral:   12.20 cm/s LVOT diam:     2.00 cm   LV E/e' lateral: 5.6 LV SV:         75 LV SV Index:   34        2D Longitudinal Strain LVOT Area:     3.14 cm  2D Strain GLS Avg:     -24.1 %                           3D Volume EF:                           3D EF:        61 %                          LV EDV:       150 ml                          LV ESV:       58 ml                          LV SV:        92 ml RIGHT VENTRICLE RV S prime:     11.90 cm/s TAPSE (M-mode): 1.4 cm LEFT ATRIUM             Index       RIGHT ATRIUM          Index LA diam:        3.30 cm 1.51 cm/m  RA Area:     8.26 cm LA Vol (A2C):   19.2 ml 8.77 ml/m  RA Volume:   16.60 ml 7.58 ml/m LA Vol (A4C):   19.0 ml 8.68 ml/m LA Biplane Vol: 19.6 ml 8.95 ml/m  AORTIC VALVE LVOT Vmax:   109.00 cm/s LVOT Vmean:  79.500 cm/s LVOT VTI:    0.240 m  AORTA Ao Root diam: 3.10 cm Ao Asc diam:  2.50 cm MITRAL VALVE MV Area (PHT): 2.97 cm    SHUNTS MV Decel Time: 255 msec  Systemic VTI:  0.24 m MV E velocity: 68.40 cm/s  Systemic Diam: 2.00 cm MV A velocity: 53.30 cm/s MV E/A ratio:  1.28 Olga Millers MD Electronically signed by Olga Millers MD Signature Date/Time: 12/26/2021/10:26:56 AM    Final       Subjective: Patient seen and examined at the bedside this morning.  Hemodynamically stable for discharge today.  Discharge Exam: Vitals:   12/26/21 2132 12/27/21 0817  BP: 113/63   Pulse: 62 (!) 59  Resp: 14 16  Temp: 98.6 F (37 C)   SpO2: 99% 96%   Vitals:   12/26/21 0448 12/26/21 0958 12/26/21 2132 12/27/21 0817  BP: 114/67 119/68 113/63   Pulse: 73 76 62 (!) 59  Resp: Temp: 98.8 F (37.1 C) 98.4 F (36.9 C) 98.6 F (37 C)   TempSrc: Oral Oral Oral   SpO2: 100% 98% 99% 96%  Weight:      Height:        General: Pt is alert, awake, not in acute distress Cardiovascular: RRR, S1/S2 +, no rubs, no gallops Respiratory: CTA bilaterally, no wheezing, no rhonchi Abdominal: Soft, NT, ND, bowel sounds + Extremities: no edema, no cyanosis    The results of significant diagnostics from this hospitalization (including imaging, microbiology, ancillary and laboratory) are listed below for reference.     Microbiology: No results found for this or any previous  visit (from the past 240 hour(s)).   Labs: BNP (last 3 results) No results for input(s): BNP in the last 8760 hours. Basic Metabolic Panel: Recent Labs  Lab 12/26/21 0113 12/27/21 0659  NA 136 138  K 3.9 3.4*  CL 111 111  CO2 20* 23  GLUCOSE 115* 89  BUN 9 10  CREATININE 0.77 0.92  CALCIUM 8.8* 8.5*   Liver Function Tests: Recent Labs  Lab 12/27/21 0659  AST 21  ALT 61*  ALKPHOS 65  BILITOT 0.1*  PROT 6.2*  ALBUMIN 3.2*   Recent Labs  Lab 12/26/21 0113  LIPASE 26   No results for input(s): AMMONIA in the last 168 hours. CBC: Recent Labs  Lab 12/26/21 0113 12/27/21 0659  WBC 11.9* 8.2  HGB 11.7* 11.8*  HCT 36.1 36.8  MCV 88.9 90.0  PLT 287 275   Cardiac Enzymes: No results for input(s): CKTOTAL, CKMB, CKMBINDEX, TROPONINI in the last 168 hours. BNP: Invalid input(s): POCBNP CBG: No results for input(s): GLUCAP in the last 168 hours. D-Dimer No results for input(s): DDIMER in the last 72 hours. Hgb A1c No results for input(s): HGBA1C in the last 72 hours. Lipid Profile Recent Labs    12/26/21 0113  CHOL 188  HDL 39*  LDLCALC 133*  TRIG 78  CHOLHDL 4.8   Thyroid function studies Recent Labs    12/25/21 1835  TSH 0.797   Anemia work up Recent Labs    12/25/21 1822  FERRITIN 42   Urinalysis    Component Value Date/Time   COLORURINE AMBER (A) 12/09/2021 2115   APPEARANCEUR TURBID (A) 12/09/2021 2115   LABSPEC 1.021 12/09/2021 2115   PHURINE 5.0 12/09/2021 2115   GLUCOSEU NEGATIVE 12/09/2021 2115   HGBUR LARGE (A) 12/09/2021 2115   BILIRUBINUR NEGATIVE 12/09/2021 2115   KETONESUR 5 (A) 12/09/2021 2115   PROTEINUR 100 (A) 12/09/2021 2115   UROBILINOGEN 1.0 11/21/2014 1530   NITRITE POSITIVE (A) 12/09/2021 2115   LEUKOCYTESUR LARGE (A) 12/09/2021 2115   Sepsis Labs Invalid input(s): PROCALCITONIN,  WBC,  LACTICIDVEN Microbiology  No results found for this or any previous visit (from the past 240 hour(s)).  Please note: You  were cared for by a hospitalist during your hospital stay. Once you are discharged, your primary care physician will handle any further medical issues. Please note that NO REFILLS for any discharge medications will be authorized once you are discharged, as it is imperative that you return to your primary care physician (or establish a relationship with a primary care physician if you do not have one) for your post hospital discharge needs so that they can reassess your need for medications and monitor your lab values.    Time coordinating discharge: 40 minutes  SIGNED:   Burnadette Pop, MD  [facility] 12/27/2021, 1:14 PM Pager [phone]  If 7PM-7AM, please contact night-coverage [email or web address] Password [email or web address]

## 2021-12-29 DIAGNOSIS — Z419 Encounter for procedure for purposes other than remedying health state, unspecified: Secondary | ICD-10-CM | POA: Diagnosis not present

## 2021-12-30 DIAGNOSIS — F411 Generalized anxiety disorder: Secondary | ICD-10-CM | POA: Diagnosis not present

## 2022-01-04 ENCOUNTER — Ambulatory Visit (INDEPENDENT_AMBULATORY_CARE_PROVIDER_SITE_OTHER): Payer: Medicaid Other | Admitting: Family Medicine

## 2022-01-04 ENCOUNTER — Encounter: Payer: Self-pay | Admitting: Family Medicine

## 2022-01-04 DIAGNOSIS — F3132 Bipolar disorder, current episode depressed, moderate: Secondary | ICD-10-CM | POA: Diagnosis not present

## 2022-01-04 DIAGNOSIS — M160 Bilateral primary osteoarthritis of hip: Secondary | ICD-10-CM

## 2022-01-04 DIAGNOSIS — F411 Generalized anxiety disorder: Secondary | ICD-10-CM

## 2022-01-04 DIAGNOSIS — R0683 Snoring: Secondary | ICD-10-CM

## 2022-01-04 DIAGNOSIS — R748 Abnormal levels of other serum enzymes: Secondary | ICD-10-CM

## 2022-01-04 DIAGNOSIS — R7401 Elevation of levels of liver transaminase levels: Secondary | ICD-10-CM

## 2022-01-04 DIAGNOSIS — M0820 Juvenile rheumatoid arthritis with systemic onset, unspecified site: Secondary | ICD-10-CM

## 2022-01-04 DIAGNOSIS — F332 Major depressive disorder, recurrent severe without psychotic features: Secondary | ICD-10-CM | POA: Diagnosis not present

## 2022-01-04 DIAGNOSIS — E559 Vitamin D deficiency, unspecified: Secondary | ICD-10-CM

## 2022-01-04 DIAGNOSIS — E039 Hypothyroidism, unspecified: Secondary | ICD-10-CM

## 2022-01-04 DIAGNOSIS — F1290 Cannabis use, unspecified, uncomplicated: Secondary | ICD-10-CM

## 2022-01-04 DIAGNOSIS — Z7689 Persons encountering health services in other specified circumstances: Secondary | ICD-10-CM | POA: Diagnosis not present

## 2022-01-04 DIAGNOSIS — M0800 Unspecified juvenile rheumatoid arthritis of unspecified site: Secondary | ICD-10-CM | POA: Diagnosis not present

## 2022-01-04 LAB — BAYER DCA HB A1C WAIVED: HB A1C (BAYER DCA - WAIVED): 4.9 % (ref 4.8–5.6)

## 2022-01-04 NOTE — Progress Notes (Signed)
   Subjective:  Patient ID: [name], adult    DOB: 03/26/1991, 31 y.o.   MRN: [id number]  Patient Care Team: ,  M, FNP as PCP - General (Family Medicine) Branch, Jonathan F, MD as PCP - Cardiology (Cardiology)   Chief Complaint:  New Patient (Initial Visit) (Obesity/Thyroid/Cholesterol/Right leg calf pain)   HPI: [name] is a 31 y.o. adult presenting on 01/04/2022 for New Patient (Initial Visit) (Obesity/Thyroid/Cholesterol/Right leg calf pain)   Pt presents today to establish care with new PCP. Pt is a transgender female who was formerly on testosterone therapy, has not been on in over a month per reports. Pt has a complex medical history including Still's disease, juvenile RA, bilateral hip OA, bipolar disorder, depression, anxiety, morbid obesity, elevated liver enzymes, vit D deficiency, insomnia, marijuana use, and nicotine use. Pt was recently admitted to [facility] [facility] for chest pain. Workup negative for coronary event. Symptoms attributed to Still's Disease. Pt has not followed up with rheumatology or ortho in over a year. Has not been to psychiatry in several months as insurance no longer covers former psychiatrist. During hospital admission, liver enzymes and lipase were elevated. LDL was also elevated and pt was started on low dose statin therapy. Main concern if weight management. Has not been physically active due to worsening arthritis in hips. Does not follow a specific diet. Was on phentermine in the past with a 30 pound weight loss but made anxiety worse. Pt also complains of ongoing daytime fatigue and loud snoring. Has not had a sleep study in the past. Prior PCP records have been requested. Available data in EHR reviewed extensively.    Relevant past medical, surgical, family, and social history reviewed and updated as indicated.  Allergies and medications reviewed and updated. Data reviewed: Chart in Epic.   Past Medical History:  Diagnosis Date    Anxiety attack    panic attacks   Depression    GERD (gastroesophageal reflux disease)    Headache    Myocardial infarction (HCC)    age 21   Pericarditis    RA (rheumatoid arthritis) (HCC)    Still disease, 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HCC)    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HCC)    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HCC)    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HCC)     Past Surgical History:  Procedure Laterality Date   CHOLECYSTECTOMY N/A 08/29/2021   Procedure: LAPAROSCOPIC CHOLECYSTECTOMY WITH ICG;  Surgeon: White, Christopher M, MD;  Location: [facility];  Service: General;  Laterality: N/A;   WISDOM TOOTH EXTRACTION      Social History   Socioeconomic History   Marital status: Significant Other    Spouse name: Not on file   Number of children: Not on file   Years of education: Not on file   Highest education level: Not on file  Occupational History   Not on file  Tobacco Use   Smoking status: Former    Packs/day: 1.00    Years: 5.00    Pack years: 5.00    Types: Cigarettes, E-cigarettes    Start date: 07/27/2009    Quit date: 12/2019    Years since quitting: 2.0   Smokeless tobacco: Never  Vaping Use   Vaping Use: Every day   Substances: Nicotine, Flavoring  Substance and Sexual Activity   Alcohol use: Never   Drug use: Yes    Types: Marijuana    Comment: marijuana use every day   Sexual activity: Yes    Birth control/protection: None    Other Topics Concern   Not on file  Social History Narrative   Not on file   Social Determinants of Health   Financial Resource Strain: Not on file  Food Insecurity: Not on file  Transportation Needs: Not on file  Physical Activity: Not on file  Stress: Not on file  Social Connections: Not on file  Intimate Partner Violence: Not on file    Outpatient Encounter Medications as of 01/04/2022  Medication Sig   acetaminophen (TYLENOL) 500 MG tablet Take 1,000 mg by mouth every 6 (six) hours as needed for mild pain or headache.   ALPRAZolam (XANAX) 0.25 MG tablet Take 0.25 mg by mouth  daily as needed for anxiety.   ARIPiprazole (ABILIFY) 2 MG tablet Take 2 mg by mouth daily.   aspirin EC 81 MG tablet Take 1 tablet (81 mg total) by mouth daily. Swallow whole.   diphenhydrAMINE (BENADRYL) 25 MG tablet Take 25 mg by mouth daily as needed for allergies.   ibuprofen (ADVIL) 200 MG tablet Take 800 mg by mouth every 6 (six) hours as needed for moderate pain.   omeprazole (PRILOSEC) 40 MG capsule Take 40 mg by mouth daily.   ondansetron (ZOFRAN) 4 MG tablet Take 1 tablet (4 mg total) by mouth daily as needed for nausea or vomiting.   rosuvastatin (CRESTOR) 5 MG tablet Take 1 tablet (5 mg total) by mouth daily.   [DISCONTINUED] HYDROcodone-acetaminophen (NORCO/VICODIN) 5-325 MG tablet Take 1 tablet by mouth every 6 (six) hours as needed for moderate pain. (Patient not taking: Reported on 12/26/2021)   [DISCONTINUED] methocarbamol (ROBAXIN) 500 MG tablet Take 1 tablet (500 mg total) by mouth 2 (two) times daily. (Patient taking differently: Take 500 mg by mouth 2 (two) times daily as needed for muscle spasms.)   No facility-administered encounter medications on file as of 01/04/2022.    Allergies  Allergen Reactions   Strawberry Extract Itching and Rash    Review of Systems  Constitutional:  Positive for activity change, appetite change and fatigue. Negative for chills, diaphoresis, fever and unexpected weight change.  Eyes:  Negative for photophobia and visual disturbance.  Respiratory:  Negative for cough, chest tightness and shortness of breath.   Cardiovascular:  Negative for chest pain, palpitations and leg swelling.  Gastrointestinal:  Negative for abdominal pain, diarrhea, nausea and vomiting.  Endocrine: Negative for cold intolerance, heat intolerance, polydipsia, polyphagia and polyuria.  Genitourinary:  Negative for decreased urine volume and difficulty urinating.  Musculoskeletal:  Positive for arthralgias, back pain, gait problem and myalgias. Negative for joint  swelling, neck pain and neck stiffness.  Neurological:  Positive for light-headedness. Negative for dizziness, tremors, seizures, syncope, facial asymmetry, speech difficulty, weakness, numbness and headaches.  Psychiatric/Behavioral:  Positive for agitation, decreased concentration, dysphoric mood and sleep disturbance. Negative for behavioral problems, confusion, hallucinations, self-injury and suicidal ideas. The patient is nervous/anxious. The patient is not hyperactive.        Objective:  BP 122/78   Pulse 90   Temp 98.8 F (37.1 C)   Ht 5' 3" (1.6 m)   Wt 271 lb (122.9 kg)   LMP 12/21/2021 (Approximate)   SpO2 96%   BMI 48.01 kg/m    Wt Readings from Last 3 Encounters:  01/04/22 271 lb (122.9 kg)  12/25/21 268 lb 1.6 oz (121.6 kg)  12/09/21 250 lb (113.4 kg)    Physical Exam Vitals and nursing note reviewed.  Constitutional:      Appearance: Normal appearance. He is morbidly obese.  HENT:     Head: Normocephalic and atraumatic.     Nose: Nose normal.     Mouth/Throat:     Mouth: Mucous membranes are moist.  Eyes:     Conjunctiva/sclera: Conjunctivae normal.     Pupils: Pupils are equal, round, and reactive to light.  Cardiovascular:     Rate and Rhythm: Normal rate and regular rhythm.     Heart sounds: Normal heart sounds. No murmur heard.   No friction rub. No gallop.  Pulmonary:     Effort: Pulmonary effort is normal.     Breath sounds: Normal breath sounds.  Musculoskeletal:     Right lower leg: No edema.     Left lower leg: No edema.  Skin:    General: Skin is warm and dry.     Capillary Refill: Capillary refill takes less than 2 seconds.  Neurological:     General: No focal deficit present.     Mental Status: He is alert and oriented to person, place, and time.     Gait: Gait abnormal (antalgic).  Psychiatric:        Mood and Affect: Mood normal.        Behavior: Behavior normal.        Thought Content: Thought content normal.        Judgment:  Judgment normal.    Results for orders placed or performed during the hospital encounter of 12/25/21  Ferritin  Result Value Ref Range   Ferritin 42 11 - 307 ng/mL  C-reactive protein  Result Value Ref Range   CRP 1.0 (H) <1.0 mg/dL  Sedimentation rate  Result Value Ref Range   Sed Rate 26 (H) 0 - 22 mm/hr  HIV Antibody (routine testing w rflx)  Result Value Ref Range   HIV Screen 4th Generation wRfx Non Reactive Non Reactive  TSH  Result Value Ref Range   TSH 0.797 0.350 - 4.500 uIU/mL  Basic metabolic panel  Result Value Ref Range   Sodium 136 135 - 145 mmol/L   Potassium 3.9 3.5 - 5.1 mmol/L   Chloride 111 98 - 111 mmol/L   CO2 20 (L) 22 - 32 mmol/L   Glucose, Bld 115 (H) 70 - 99 mg/dL   BUN 9 6 - 20 mg/dL   Creatinine, Ser 0.77 0.44 - 1.00 mg/dL   Calcium 8.8 (L) 8.9 - 10.3 mg/dL   GFR, Estimated >60 >60 mL/min   Anion gap 5 5 - 15  CBC  Result Value Ref Range   WBC 11.9 (H) 4.0 - 10.5 K/uL   RBC 4.06 3.87 - 5.11 MIL/uL   Hemoglobin 11.7 (L) 12.0 - 15.0 g/dL   HCT 36.1 36.0 - 46.0 %   MCV 88.9 80.0 - 100.0 fL   MCH 28.8 26.0 - 34.0 pg   MCHC 32.4 30.0 - 36.0 g/dL   RDW 14.2 11.5 - 15.5 %   Platelets 287 150 - 400 K/uL   nRBC 0.0 0.0 - 0.2 %  Lipase, blood  Result Value Ref Range   Lipase 26 11 - 51 U/L  Hepatitis panel, acute  Result Value Ref Range   Hepatitis B Surface Ag NON REACTIVE NON REACTIVE   HCV Ab NON REACTIVE NON REACTIVE   Hep A IgM NON REACTIVE NON REACTIVE   Hep B C IgM NON REACTIVE NON REACTIVE  Lipid panel  Result Value Ref Range   Cholesterol 188 0 - 200 mg/dL   Triglycerides 78 <150 mg/dL  HDL 39 (L) >40 mg/dL   Total CHOL/HDL Ratio 4.8 RATIO   VLDL 16 0 - 40 mg/dL   LDL Cholesterol 133 (H) 0 - 99 mg/dL  CBC  Result Value Ref Range   WBC 8.2 4.0 - 10.5 K/uL   RBC 4.09 3.87 - 5.11 MIL/uL   Hemoglobin 11.8 (L) 12.0 - 15.0 g/dL   HCT 36.8 36.0 - 46.0 %   MCV 90.0 80.0 - 100.0 fL   MCH 28.9 26.0 - 34.0 pg   MCHC 32.1 30.0 - 36.0  g/dL   RDW 14.6 11.5 - 15.5 %   Platelets 275 150 - 400 K/uL   nRBC 0.0 0.0 - 0.2 %  Basic metabolic panel  Result Value Ref Range   Sodium 138 135 - 145 mmol/L   Potassium 3.4 (L) 3.5 - 5.1 mmol/L   Chloride 111 98 - 111 mmol/L   CO2 23 22 - 32 mmol/L   Glucose, Bld 89 70 - 99 mg/dL   BUN 10 6 - 20 mg/dL   Creatinine, Ser 0.92 0.44 - 1.00 mg/dL   Calcium 8.5 (L) 8.9 - 10.3 mg/dL   GFR, Estimated >60 >60 mL/min   Anion gap 4 (L) 5 - 15  Hepatic function panel  Result Value Ref Range   Total Protein 6.2 (L) 6.5 - 8.1 g/dL   Albumin 3.2 (L) 3.5 - 5.0 g/dL   AST 21 15 - 41 U/L   ALT 61 (H) 0 - 44 U/L   Alkaline Phosphatase 65 38 - 126 U/L   Total Bilirubin 0.1 (L) 0.3 - 1.2 mg/dL   Bilirubin, Direct <0.1 0.0 - 0.2 mg/dL   Indirect Bilirubin NOT CALCULATED 0.3 - 0.9 mg/dL  ECHOCARDIOGRAM COMPLETE  Result Value Ref Range   Weight 4,289.6 oz   Height 63 in   BP 114/67 mmHg   S' Lateral 3.20 cm   Area-P 1/2 2.97 cm2       Pertinent labs & imaging results that were available during my care of the patient were reviewed by me and considered in my medical decision making.  Assessment & Plan:  Mella was seen today for new patient (initial visit).  Diagnoses and all orders for this visit:  Encounter to establish care Prior PCP records have been requested. EHR database reviewed in detail today. Will place new referral for specialists.  -     CMP14+EGFR -     CBC with Differential/Platelet -     Lipid panel -     VITAMIN D 25 Hydroxy (Vit-D Deficiency, Fractures) -     Thyroid Panel With TSH -     Lipase -     Amylase -     Magnesium -     Bayer DCA Hb A1c Waived -     Ambulatory referral to Orthopedic Surgery -     Ambulatory referral to Psychiatry -     Ambulatory referral to Sleep Studies  Morbid obesity (HCC) Diet and exercise encouraged. Labs as ordered. Handouts on weight management and diet provided to pt today. Will follow up in 6 weeks for BMI recheck.  -      CMP14+EGFR -     CBC with Differential/Platelet -     Lipid panel -     VITAMIN D 25 Hydroxy (Vit-D Deficiency, Fractures) -     Thyroid Panel With TSH -     Lipase -     Amylase -     Magnesium -       Bayer DCA Hb A1c Waived  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[place]) Pt aware to reestablish with rheumatology. -     CMP14+EGFR -     CBC with Differential/Platelet  Bipolar affective disorder, currently depressed, moderate (HCC) GAD (generalized anxiety disorder) Severe episode of recurrent major depressive disorder, without psychotic features [facility]) Referral place to psychiatry. Will recheck thyroid function.  -     Thyroid Panel With TSH -     Ambulatory referral to Psychiatry  Elevated lipase Transaminitis Will recheck labs today. -     CMP14+EGFR -     Lipase -     Amylase  Vitamin D deficiency Will check levels today and restart repletion therapy if warranted.  -     VITAMIN D 25 Hydroxy (Vit-D Deficiency, Fractures)  Loud snoring Referral for sleep study.  -     Ambulatory referral to Sleep Studies  Bilateral primary osteoarthritis of hip Referral placed for new ortho as pt does not wish to travel to [facility].  -     Ambulatory referral to Orthopedic Surgery  Juvenile rheumatoid arthritis ([place]) Pt aware to follow up with rheumatology.     Continue all other maintenance medications.  Follow up plan: Return in about 6 weeks (around 02/15/2022), or if symptoms worsen or fail to improve, for BMI.   Continue healthy lifestyle choices, including diet (rich in fruits, vegetables, and lean proteins, and low in salt and simple carbohydrates) and exercise (at least 30 minutes of moderate physical activity daily).  Educational handout given for weight loss  The above assessment and management plan was discussed with the patient. The patient verbalized understanding of and has agreed to the management plan. Patient is aware to call the clinic if they develop any new symptoms or if symptoms  persist or worsen. Patient is aware when to return to the clinic for a follow-up visit. Patient educated on when it is appropriate to go to the emergency department.   Monia Pouch, FNP-C [facility] [phone]

## 2022-01-04 NOTE — Patient Instructions (Signed)
Here is a guide to help us find out which weight loss medications will be covered by your insurance plan.  Please check out this web site  [employer] and follow the 3 simple steps.   Wegovy or Saxenda  There is also a phone number you can call if you do not have access to the Internet. [phone] (Monday- Friday 8am-8pm)  [employer] provides coverage information for more than 80% of the inquiries submitted!!  

## 2022-01-05 DIAGNOSIS — J0100 Acute maxillary sinusitis, unspecified: Secondary | ICD-10-CM | POA: Diagnosis not present

## 2022-01-05 LAB — CMP14+EGFR
ALT: 48 IU/L — ABNORMAL HIGH (ref 0–32)
AST: 27 IU/L (ref 0–40)
Albumin/Globulin Ratio: 1.5 (ref 1.2–2.2)
Albumin: 4.5 g/dL (ref 3.8–4.8)
Alkaline Phosphatase: 107 IU/L (ref 44–121)
BUN/Creatinine Ratio: 10 (ref 9–23)
BUN: 9 mg/dL (ref 6–20)
Bilirubin Total: 0.2 mg/dL (ref 0.0–1.2)
CO2: 21 mmol/L (ref 20–29)
Calcium: 9.4 mg/dL (ref 8.7–10.2)
Chloride: 105 mmol/L (ref 96–106)
Creatinine, Ser: 0.91 mg/dL (ref 0.57–1.00)
Globulin, Total: 3.1 g/dL (ref 1.5–4.5)
Glucose: 106 mg/dL — ABNORMAL HIGH (ref 70–99)
Potassium: 4.6 mmol/L (ref 3.5–5.2)
Sodium: 142 mmol/L (ref 134–144)
Total Protein: 7.6 g/dL (ref 6.0–8.5)
eGFR: 86 mL/min/{1.73_m2} (ref >59)

## 2022-01-05 LAB — LIPID PANEL
Chol/HDL Ratio: 3.3 ratio (ref 0.0–4.4)
Cholesterol, Total: 153 mg/dL (ref 100–199)
HDL: 47 mg/dL (ref >39)
LDL Chol Calc (NIH): 87 mg/dL (ref 0–99)
Triglycerides: 106 mg/dL (ref 0–149)
VLDL Cholesterol Cal: 19 mg/dL (ref 5–40)

## 2022-01-05 LAB — AMYLASE: Amylase: 32 U/L (ref 31–110)

## 2022-01-05 LAB — CBC WITH DIFFERENTIAL/PLATELET
Basophils Absolute: 0.1 10*3/uL (ref 0.0–0.2)
Basos: 0 %
EOS (ABSOLUTE): 0.4 10*3/uL (ref 0.0–0.4)
Eos: 3 %
Hematocrit: 42 % (ref 34.0–46.6)
Hemoglobin: 13.7 g/dL (ref 11.1–15.9)
Immature Grans (Abs): 0 10*3/uL (ref 0.0–0.1)
Immature Granulocytes: 0 %
Lymphocytes Absolute: 2.4 10*3/uL (ref 0.7–3.1)
Lymphs: 21 %
MCH: 28.8 pg (ref 26.6–33.0)
MCHC: 32.6 g/dL (ref 31.5–35.7)
MCV: 88 fL (ref 79–97)
Monocytes Absolute: 0.6 10*3/uL (ref 0.1–0.9)
Monocytes: 5 %
Neutrophils Absolute: 8 10*3/uL — ABNORMAL HIGH (ref 1.4–7.0)
Neutrophils: 71 %
Platelets: 316 10*3/uL (ref 150–450)
RBC: 4.76 x10E6/uL (ref 3.77–5.28)
RDW: 14.3 % (ref 11.7–15.4)
WBC: 11.4 10*3/uL — ABNORMAL HIGH (ref 3.4–10.8)

## 2022-01-05 LAB — VITAMIN D 25 HYDROXY (VIT D DEFICIENCY, FRACTURES): Vit D, 25-Hydroxy: 10 ng/mL — ABNORMAL LOW (ref 30.0–100.0)

## 2022-01-05 LAB — THYROID PANEL WITH TSH
Free Thyroxine Index: 1.8 (ref 1.2–4.9)
T3 Uptake Ratio: 20 % — ABNORMAL LOW (ref 24–39)
T4, Total: 8.8 ug/dL (ref 4.5–12.0)
TSH: 7.75 u[IU]/mL — ABNORMAL HIGH (ref 0.450–4.500)

## 2022-01-05 LAB — MAGNESIUM: Magnesium: 2.2 mg/dL (ref 1.6–2.3)

## 2022-01-05 LAB — LIPASE: Lipase: 24 U/L (ref 14–72)

## 2022-01-05 MED ORDER — VITAMIN D (ERGOCALCIFEROL) 1.25 MG (50000 UNIT) PO CAPS: 50000.0000 [IU] | ORAL_CAPSULE | ORAL | 3 refills | Status: DC

## 2022-01-05 MED ORDER — LEVOTHYROXINE SODIUM 25 MCG PO TABS: 25.0000 ug | ORAL_TABLET | Freq: Every day | ORAL | 1 refills | Status: DC

## 2022-01-05 NOTE — Addendum Note (Signed)
Addended by: Sonny Masters on: 01/05/2022 09:00 AM   Modules accepted: Orders

## 2022-01-06 DIAGNOSIS — F411 Generalized anxiety disorder: Secondary | ICD-10-CM | POA: Diagnosis not present

## 2022-01-09 ENCOUNTER — Encounter: Payer: Self-pay | Admitting: Family Medicine

## 2022-01-10 ENCOUNTER — Emergency Department (HOSPITAL_COMMUNITY): Payer: Medicaid Other

## 2022-01-10 ENCOUNTER — Emergency Department (HOSPITAL_COMMUNITY)
Admission: EM | Admit: 2022-01-10 | Discharge: 2022-01-10 | Disposition: A | Discharge status: Home / Self Care | Payer: Medicaid Other | Attending: Emergency Medicine | Admitting: Emergency Medicine

## 2022-01-10 ENCOUNTER — Encounter (HOSPITAL_COMMUNITY): Payer: Self-pay | Admitting: Emergency Medicine

## 2022-01-10 ENCOUNTER — Other Ambulatory Visit: Payer: Self-pay | Admitting: Family Medicine

## 2022-01-10 ENCOUNTER — Other Ambulatory Visit: Payer: Self-pay

## 2022-01-10 DIAGNOSIS — F17210 Nicotine dependence, cigarettes, uncomplicated: Secondary | ICD-10-CM | POA: Diagnosis not present

## 2022-01-10 DIAGNOSIS — M160 Bilateral primary osteoarthritis of hip: Secondary | ICD-10-CM | POA: Insufficient documentation

## 2022-01-10 DIAGNOSIS — R030 Elevated blood-pressure reading, without diagnosis of hypertension: Secondary | ICD-10-CM | POA: Diagnosis not present

## 2022-01-10 DIAGNOSIS — F419 Anxiety disorder, unspecified: Secondary | ICD-10-CM | POA: Diagnosis not present

## 2022-01-10 DIAGNOSIS — F332 Major depressive disorder, recurrent severe without psychotic features: Secondary | ICD-10-CM

## 2022-01-10 DIAGNOSIS — F411 Generalized anxiety disorder: Secondary | ICD-10-CM

## 2022-01-10 DIAGNOSIS — M79661 Pain in right lower leg: Secondary | ICD-10-CM | POA: Insufficient documentation

## 2022-01-10 DIAGNOSIS — Z6841 Body Mass Index (BMI) 40.0 and over, adult: Secondary | ICD-10-CM | POA: Diagnosis not present

## 2022-01-10 DIAGNOSIS — M0800 Unspecified juvenile rheumatoid arthritis of unspecified site: Secondary | ICD-10-CM | POA: Diagnosis not present

## 2022-01-10 DIAGNOSIS — K219 Gastro-esophageal reflux disease without esophagitis: Secondary | ICD-10-CM | POA: Diagnosis not present

## 2022-01-10 DIAGNOSIS — M061 Adult-onset Still's disease: Secondary | ICD-10-CM | POA: Diagnosis not present

## 2022-01-10 DIAGNOSIS — F3132 Bipolar disorder, current episode depressed, moderate: Secondary | ICD-10-CM

## 2022-01-10 DIAGNOSIS — E785 Hyperlipidemia, unspecified: Secondary | ICD-10-CM | POA: Diagnosis not present

## 2022-01-10 DIAGNOSIS — F339 Major depressive disorder, recurrent, unspecified: Secondary | ICD-10-CM | POA: Diagnosis not present

## 2022-01-10 MED ORDER — HYDROMORPHONE HCL 1 MG/ML IJ SOLN
1.0000 mg | Freq: Once | INTRAMUSCULAR | Status: AC
  Administered 2022-01-10: 1 mg via INTRAMUSCULAR
  Filled 2022-01-10: qty 1

## 2022-01-10 MED ORDER — HYDROCODONE-ACETAMINOPHEN 5-325 MG PO TABS: 1.0000 | ORAL_TABLET | Freq: Four times a day (QID) | ORAL | 0 refills | Status: DC | PRN

## 2022-01-10 MED ORDER — ONDANSETRON 4 MG PO TBDP
4.0000 mg | ORAL_TABLET | Freq: Once | ORAL | Status: AC
  Administered 2022-01-10: 4 mg via ORAL
  Filled 2022-01-10: qty 1

## 2022-01-10 NOTE — Discharge Instructions (Signed)
Your exam, plain x-rays and last months DVT study are all reassuring.  Plan to see your orthopedist on Friday as you have scheduled.  In the interim you may use the medication given and prescribed, however use caution with this medication as it will make you drowsy, do not drive within 4 hours of taking this medication.

## 2022-01-10 NOTE — ED Triage Notes (Signed)
Pt presents with right calf pain x 1 month, seen in this ED approx 2 weeks ago had US, negative for DVT, continues to have pain, despite taking Motrin.

## 2022-01-10 NOTE — ED Notes (Signed)
Patient transported to x-ray. ?

## 2022-01-10 NOTE — ED Notes (Signed)
Patients returns from x-ray

## 2022-01-11 ENCOUNTER — Telehealth: Payer: Self-pay

## 2022-01-11 MED FILL — Hydrocodone-Acetaminophen Tab 5-325 MG: ORAL | Qty: 6 | Status: AC

## 2022-01-11 NOTE — Telephone Encounter (Signed)
Transition Care Management Unsuccessful Follow-up Telephone Call  Date of discharge and from where:  01/10/2022 from [place]  Attempts:  1st Attempt  Reason for unsuccessful TCM follow-up call:  Unable to leave message    

## 2022-01-12 NOTE — Telephone Encounter (Signed)
Transition Care Management Unsuccessful Follow-up Telephone Call  Date of discharge and from where:  01/10/2022 from [facility]  Attempts:  2nd Attempt  Reason for unsuccessful TCM follow-up call:  Unable to leave message

## 2022-01-12 NOTE — ED Provider Notes (Signed)
[facility] EMERGENCY DEPARTMENT Provider Note   CSN: [id number] Arrival date & time: 01/10/22  1739     History  Chief Complaint  Patient presents with   Claudication    [name] is a 31 y.o. adult with a history most significant for Stills disease which is followed by [facility], also has GERD, primary arthritis bilateral hips presenting with persistent pain in his right calf, described as a deep ache at the site which causes waxing and waning pain, at its worst keep him awake with the intensity of pain. He underwent an ultrasound of the leg when the pain first presented on 4/25 which was negative for dvt or other source of pain.  He denies injury, swelling of the extremity, rash, radiation of pain to or from his hip.  He has taken tylenol and ibuprofen without significant improvement.  He is scheduled to see his orthopedist regarding this condition on Friday.  The history is provided by the patient.       Home Medications Prior to Admission medications   Medication Sig Start Date End Date Taking? Authorizing Provider  HYDROcodone-acetaminophen (NORCO/VICODIN) 5-325 MG tablet Take 1 tablet by mouth every 6 (six) hours as needed. 01/10/22  Yes Janeese Mcgloin, Raynelle Fanning, PA-C  HYDROcodone-acetaminophen (NORCO/VICODIN) 5-325 MG tablet Take 1 tablet by mouth every 6 (six) hours as needed. 01/10/22  Yes Reece Fehnel, Raynelle Fanning, PA-C  acetaminophen (TYLENOL) 500 MG tablet Take 1,000 mg by mouth every 6 (six) hours as needed for mild pain or headache.    [provider]  ALPRAZolam Prudy Feeler) 0.25 MG tablet Take 0.25 mg by mouth daily as needed for anxiety. 11/15/21   [provider]  ARIPiprazole (ABILIFY) 2 MG tablet Take 2 mg by mouth daily. 12/15/21   [provider]  aspirin EC 81 MG tablet Take 1 tablet (81 mg total) by mouth daily. Swallow whole. 12/28/21   Burnadette Pop, MD  diphenhydrAMINE (BENADRYL) 25 MG tablet Take 25 mg by mouth daily as needed for allergies.     [provider]  ibuprofen (ADVIL) 200 MG tablet Take 800 mg by mouth every 6 (six) hours as needed for moderate pain.    [provider]  levothyroxine (SYNTHROID) 25 MCG tablet Take 1 tablet (25 mcg total) by mouth daily. 01/05/22   Sonny Masters, FNP  omeprazole (PRILOSEC) 40 MG capsule Take 40 mg by mouth daily. 08/19/21   [provider]  ondansetron (ZOFRAN) 4 MG tablet Take 1 tablet (4 mg total) by mouth daily as needed for nausea or vomiting. 09/25/21 09/25/22  Elson Areas, PA-C  rosuvastatin (CRESTOR) 5 MG tablet Take 1 tablet (5 mg total) by mouth daily. 12/27/21   Burnadette Pop, MD  Vitamin D, Ergocalciferol, (DRISDOL) 1.25 MG (50000 UNIT) CAPS capsule Take 1 capsule (50,000 Units total) by mouth every 7 (seven) days. 01/05/22   Sonny Masters, FNP      Allergies    Strawberry extract    Review of Systems   Review of Systems  Constitutional: Negative.  Negative for fever.  HENT: Negative.    Respiratory: Negative.    Cardiovascular: Negative.   Gastrointestinal: Negative.   Musculoskeletal:  Positive for arthralgias. Negative for joint swelling and myalgias.  Skin: Negative.   Neurological:  Negative for weakness and numbness.    Physical Exam Updated Vital Signs BP 128/80 (BP Location: Right Arm)   Pulse 72   Temp 98.2 F (36.8 C) (Oral)   Resp 15  Ht 5\' 3"  (1.6 m)   Wt 117.9 kg   LMP 12/21/2021 (Approximate)   SpO2 99%   BMI 46.06 kg/m  Physical Exam Constitutional:      Appearance: He is well-developed.  HENT:     Head: Atraumatic.  Cardiovascular:     Comments: Pulses equal bilaterally Musculoskeletal:        General: Tenderness present.     Cervical back: Normal range of motion.     Right knee: Normal. No swelling, erythema or bony tenderness.     Right lower leg: Tenderness present.     Right ankle: Normal. Normal pulse.     Right Achilles Tendon: Normal.     Comments: No anterior tenderness right lower leg. Tender to  palpation of the calf right, no induration, erythema, cords, rash, signs of injury. Negative Homan's. No thigh pain to palpation.  Skin:    General: Skin is warm and dry.  Neurological:     Mental Status: He is alert.     Sensory: No sensory deficit.     Motor: No weakness.     Deep Tendon Reflexes: Reflexes normal.     ED Results / Procedures / Treatments   Labs (all labs ordered are listed, but only abnormal results are displayed) Labs Reviewed - No data to display  EKG None  Radiology DG Tibia/Fibula Right  Result Date: 01/10/2022 CLINICAL DATA:  Pain. EXAM: RIGHT TIBIA AND FIBULA - 2 VIEW COMPARISON:  None Available. FINDINGS: There is no evidence of fracture or other focal bone lesions. Soft tissues are unremarkable. IMPRESSION: Negative. Electronically Signed   By: 01/12/2022 M.D.   On: 01/10/2022 21:34    Procedures Procedures    Medications Ordered in ED Medications  HYDROmorphone (DILAUDID) injection 1 mg (1 mg Intramuscular Given 01/10/22 2128)  ondansetron (ZOFRAN-ODT) disintegrating tablet 4 mg (4 mg Oral Given 01/10/22 2127)    ED Course/ Medical Decision Making/ A&P                           Medical Decision Making Pt with acute right calf pain x 2+ weeks,  has had a negative venous doppler.  Exam is normal except for pain with palpation of the right calf.  No erythema, no hx of fever - infectious process doubtful.  No radiation of pain to or from the site.  He does have known bilateral hip arthritis, ? Referred pain from this condition.  He is scheduled to see ortho in 3 days, encouraged to keep this appt.   Amount and/or Complexity of Data Reviewed Radiology: ordered and independent interpretation performed.    Details: negative tib/fib films  Risk Prescription drug management. Drug therapy requiring intensive monitoring for toxicity. Risk Details: Small quantity of hydrocdone prescribed,may continue ibu.  Plan f/u with ortho as  scheduled.           Final Clinical Impression(s) / ED Diagnoses Final diagnoses:  Right calf pain    Rx / DC Orders ED Discharge Orders          Ordered    HYDROcodone-acetaminophen (NORCO/VICODIN) 5-325 MG tablet  Every 6 hours PRN        01/10/22 2200    HYDROcodone-acetaminophen (NORCO/VICODIN) 5-325 MG tablet  Every 6 hours PRN        01/10/22 2200              01/12/22, PA-C 01/12/22 1314    01/14/22  J, MD 01/12/22 1356

## 2022-01-13 ENCOUNTER — Ambulatory Visit (INDEPENDENT_AMBULATORY_CARE_PROVIDER_SITE_OTHER): Payer: Medicaid Other

## 2022-01-13 ENCOUNTER — Ambulatory Visit (INDEPENDENT_AMBULATORY_CARE_PROVIDER_SITE_OTHER): Payer: Medicaid Other | Admitting: Physician Assistant

## 2022-01-13 ENCOUNTER — Encounter: Payer: Self-pay | Admitting: Physician Assistant

## 2022-01-13 VITALS — Ht 63.0 in | Wt 264.4 lb

## 2022-01-13 DIAGNOSIS — M25551 Pain in right hip: Secondary | ICD-10-CM | POA: Diagnosis not present

## 2022-01-13 DIAGNOSIS — M25552 Pain in left hip: Secondary | ICD-10-CM

## 2022-01-13 MED ORDER — PREDNISONE 10 MG (21) PO TBPK: ORAL_TABLET | ORAL | 0 refills | Status: DC

## 2022-01-13 MED ORDER — CYCLOBENZAPRINE HCL 10 MG PO TABS: 10.0000 mg | ORAL_TABLET | Freq: Three times a day (TID) | ORAL | 0 refills | Status: DC | PRN

## 2022-01-13 MED ORDER — TRAMADOL HCL 50 MG PO TABS: 50.0000 mg | ORAL_TABLET | Freq: Three times a day (TID) | ORAL | 2 refills | Status: DC | PRN

## 2022-01-13 NOTE — Progress Notes (Signed)
Office Visit Note   Patient: [name]           Date of Birth: [birth year]           MRN: [id number] Visit Date: 01/13/2022              Requested by: Sonny Masters, FNP [address] [place],  [place] [address] PCP: Sonny Masters, FNP   Assessment & Plan: Visit Diagnoses:  1. Bilateral hip pain     Plan: Impression is severe bilateral hip degenerative joint disease right greater than left with underlying lumbar pain likel aggravated from walking with an antalgic gait.  We have discussed definitive treatment of total hip arthroplasty as cortisone injections have failed to provide any relief in the past.  She currently has a BMI of over 46 and will need to get to a weight of 225 pounds in order to attain a BMI of less than 40.  She understands and agrees.  We have made a referral to weight loss clinic.  I have agreed to call in a steroid pack, muscle relaxer and tramadol for her current pain.  Follow-up with [name] as needed.  Call with concerns or questions.  Follow-Up Instructions: Return if symptoms worsen or fail to improve.   Orders:  Orders Placed This Encounter  Procedures   XR Pelvis 1-2 Views   XR Lumbar Spine 2-3 Views   Amb Ref to Medical Weight Management   Meds ordered this encounter  Medications   predniSONE (STERAPRED UNI-PAK 21 TAB) 10 MG (21) TBPK tablet    Sig: Take as directed    Dispense:  21 tablet    Refill:  0   cyclobenzaprine (FLEXERIL) 10 MG tablet    Sig: Take 1 tablet (10 mg total) by mouth 3 (three) times daily as needed for muscle spasms.    Dispense:  30 tablet    Refill:  0   traMADol (ULTRAM) 50 MG tablet    Sig: Take 1 tablet (50 mg total) by mouth 3 (three) times daily as needed.    Dispense:  30 tablet    Refill:  2      Procedures: No procedures performed   Clinical Data: No additional findings.   Subjective: No chief complaint on file.   HPI patient is a pleasant 31 year old who comes in today with bilateral hip pain  right greater than left.  This is been ongoing for several years without any injury or change in activity.  She does note her symptoms have progressively worsened.  She has recently been getting pain to the middle of her lower back and into the right calf.  She has been worked up for DVT which was negative.  She notes locking to both hips.  Pain is worse with walking as well as with any activity.  She does get slight relief when lying down for short period of time.  She has taken Robaxin and Norco without significant relief.  She does note paresthesias to her right lower extremity.  She has had both hip joints injected with cortisone without relief.  She has been told in the past she needs hip replacements but has not been able to reach a BMI of less than 40.  Review of Systems as detailed in HPI.  All others reviewed and are negative.   Objective: Vital Signs: Ht 5\' 3"  (1.6 m)   Wt 264 lb 6.4 oz (119.9 kg)   LMP 12/21/2021 (Approximate)  BMI 46.84 kg/m   Physical Exam well-developed well-nourished patient in no acute distress.  Alert and oriented x3.  Ortho Exam bilateral hip exam reveals significant pain with logroll and FADIR and very limited internal rotation.  Positive straight leg raise on the right.  Increased pain with lumbar flexion.  No focal weakness.  Neurovascular intact distally.  Specialty Comments:  No specialty comments available.  Imaging: XR Lumbar Spine 2-3 Views  Result Date: 01/13/2022 Lumbar spine x-rays show mild to moderate degenerative changes L4-5  XR Pelvis 1-2 Views  Result Date: 01/13/2022 X-rays demonstrate significant degenerative changes to both hip joints.    PMFS History: Patient Active Problem List   Diagnosis Date Noted   Vitamin D deficiency 01/04/2022   Loud snoring 01/04/2022   GAD (generalized anxiety disorder) 01/04/2022   Marijuana use 01/04/2022   Chest pain 12/25/2021   Transaminitis 12/25/2021   GERD (gastroesophageal reflux  disease) 12/25/2021   Elevated lipase 12/25/2021   Compulsive skin picking 07/28/2020   Social phobia 07/28/2020   Bipolar I disorder, current or most recent episode depressed, in partial remission (HCC) 07/19/2020   Bilateral primary osteoarthritis of hip 07/19/2020   Allergic rhinitis due to pollen 05/12/2020   MDD (major depressive disorder), recurrent episode, severe (HCC) 05/02/2019   High risk medication use 05/03/2017   Tobacco use 05/29/2016   Morbid obesity (HCC) 11/05/2014   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   Insomnia 02/18/2014   Bipolar affective disorder, currently depressed, moderate (HCC) 05/25/2011   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   Past Medical History:  Diagnosis Date   Anxiety attack    panic attacks   Depression    GERD (gastroesophageal reflux disease)    Headache    Myocardial infarction [facility])    age 83   Pericarditis    RA (rheumatoid arthritis) (HCC)    Still disease, 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HCC)    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HCC)    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HCC)    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HCC)     Family History  Problem Relation Age of Onset   Heart disease Mother    Heart attack Mother    Depression Father    Rheum arthritis Father    Hypertension Father    Bipolar disorder Brother     Past Surgical History:  Procedure Laterality Date   CHOLECYSTECTOMY N/A 08/29/2021   Procedure: LAPAROSCOPIC CHOLECYSTECTOMY WITH ICG;  Surgeon: Andria Meuse, MD;  Location: [facility];  Service: General;  Laterality: N/A;   WISDOM TOOTH EXTRACTION     Social History   Occupational History   Not on file  Tobacco Use   Smoking status: Former    Packs/day: 1.00    Years: 5.00    Total pack years: 5.00    Types: Cigarettes, E-cigarettes    Start date: 07/27/2009    Quit date: 12/2019    Years since quitting: 2.0   Smokeless tobacco: Never  Vaping Use   Vaping Use: Every day   Substances: Nicotine, Flavoring  Substance and Sexual Activity   Alcohol use: Never    Drug use: Yes    Types: Marijuana    Comment: marijuana use every day   Sexual activity: Yes    Birth control/protection: None

## 2022-01-17 NOTE — Telephone Encounter (Signed)
Transition Care Management Follow-up Telephone Call Date of discharge and from where: 01/10/2022 from [facility] How have you been since you were released from the hospital? Patient stated that he is feeling better and did not have any questions or concerns at this time.  Any questions or concerns? No  Items Reviewed: Did the pt receive and understand the discharge instructions provided? Yes  Medications obtained and verified? Yes  Other? No  Any new allergies since your discharge? No  Dietary orders reviewed? No Do you have support at home? Yes   Functional Questionnaire: (I = Independent and D = Dependent) ADLs: I  Bathing/Dressing- I  Meal Prep- I  Eating- I  Maintaining continence- I  Transferring/Ambulation- I  Managing Meds- I   Follow up appointments reviewed:  PCP Hospital f/u appt confirmed? No  Specialist Hospital f/u appt confirmed? Yes  Ortho follow up completed.  Are transportation arrangements needed? No  If their condition worsens, is the pt aware to call PCP or go to the Emergency Dept.? Yes Was the patient provided with contact information for the PCP's office or ED? Yes Was to pt encouraged to call back with questions or concerns? Yes

## 2022-01-20 ENCOUNTER — Encounter: Payer: Medicaid Other | Admitting: Radiology

## 2022-01-28 DIAGNOSIS — Z419 Encounter for procedure for purposes other than remedying health state, unspecified: Secondary | ICD-10-CM | POA: Diagnosis not present

## 2022-01-30 DIAGNOSIS — F649 Gender identity disorder, unspecified: Secondary | ICD-10-CM | POA: Diagnosis not present

## 2022-02-03 ENCOUNTER — Telehealth: Payer: Self-pay | Admitting: Physician Assistant

## 2022-02-03 ENCOUNTER — Other Ambulatory Visit: Payer: Self-pay

## 2022-02-03 DIAGNOSIS — G8929 Other chronic pain: Secondary | ICD-10-CM

## 2022-02-03 NOTE — Telephone Encounter (Signed)
Pt is calling to state nothing is helping --would like to do the back injections

## 2022-02-03 NOTE — Telephone Encounter (Signed)
Spoke with patient. Ordered MRI

## 2022-02-03 NOTE — Telephone Encounter (Signed)
Can we get lumbar spine mri first

## 2022-02-10 DIAGNOSIS — F411 Generalized anxiety disorder: Secondary | ICD-10-CM | POA: Diagnosis not present

## 2022-02-13 ENCOUNTER — Other Ambulatory Visit: Payer: Medicaid Other

## 2022-02-15 ENCOUNTER — Ambulatory Visit: Payer: Medicaid Other | Admitting: Family Medicine

## 2022-02-15 ENCOUNTER — Encounter: Payer: Self-pay | Admitting: Family Medicine

## 2022-02-15 VITALS — BP 121/79 | HR 87 | Temp 98.8°F | Ht 63.0 in | Wt 270.0 lb

## 2022-02-15 DIAGNOSIS — F411 Generalized anxiety disorder: Secondary | ICD-10-CM | POA: Diagnosis not present

## 2022-02-15 DIAGNOSIS — F3132 Bipolar disorder, current episode depressed, moderate: Secondary | ICD-10-CM | POA: Diagnosis not present

## 2022-02-15 DIAGNOSIS — K219 Gastro-esophageal reflux disease without esophagitis: Secondary | ICD-10-CM | POA: Diagnosis not present

## 2022-02-15 DIAGNOSIS — F332 Major depressive disorder, recurrent severe without psychotic features: Secondary | ICD-10-CM | POA: Diagnosis not present

## 2022-02-15 DIAGNOSIS — M5441 Lumbago with sciatica, right side: Secondary | ICD-10-CM

## 2022-02-15 DIAGNOSIS — G8929 Other chronic pain: Secondary | ICD-10-CM

## 2022-02-15 MED ORDER — ARIPIPRAZOLE 10 MG PO TABS: 10.0000 mg | ORAL_TABLET | Freq: Every day | ORAL | 0 refills | Status: DC

## 2022-02-15 MED ORDER — OMEPRAZOLE 40 MG PO CPDR: 40.0000 mg | DELAYED_RELEASE_CAPSULE | Freq: Every day | ORAL | 2 refills | Status: DC

## 2022-02-15 MED ORDER — ORLISTAT 60 MG PO CAPS: 60.0000 mg | ORAL_CAPSULE | Freq: Three times a day (TID) | ORAL | 2 refills | Status: DC

## 2022-02-15 NOTE — Progress Notes (Signed)
Subjective:  Patient ID: [name], adult    DOB: 04/22/1991, 31 y.o.   MRN: [id number]  Patient Care Team: Baruch Gouty, FNP as PCP - General (Family Medicine) Harl Bowie Alphonse Guild, MD as PCP - Cardiology (Cardiology)   Chief Complaint:  Weight Check (Referral for pain management)   HPI: [name] is a 31 y.o. adult presenting on 02/15/2022, 31 y.o. for Weight Check (Referral for pain management)   1. Chronic bilateral low back pain with right-sided sciatica Ongoing. Was formerly followed by pain management and would like to go back. Did see ortho and was offered injections, trying to get this scheduled. No bowel or bladder incontinence, saddle anesthesia, or loss of function. Does cause pain with ambulation.   2. GERD without esophagitis On PPI therapy and tolerating well. Minimal flares with certain foods. No cough, dysphagia, voice change, hemoptysis, melena, or hematochezia.   3. Severe episode of recurrent major depressive disorder, without psychotic features ([place]) 4. GAD (generalized anxiety disorder) 5. Bipolar affective disorder, currently depressed, moderate ([place]) Has not been able to see psychiatry but has seen counselor. Out of medications. Would like Abilify refilled today.     02/15/2022    9:26 AM 01/04/2022   11:41 AM  Depression screen PHQ 2/9  Decreased Interest 3 3  Down, Depressed, Hopeless 3 3  PHQ - 2 Score 6 6  Altered sleeping 3 3  Tired, decreased energy 3 3  Change in appetite 0 3  Feeling bad or failure about yourself  1 2  Trouble concentrating 3 3  Moving slowly or fidgety/restless 2 2  Suicidal thoughts 0 0  PHQ-9 Score 18 22  Difficult doing work/chores Very difficult       02/15/2022    9:27 AM 01/04/2022   11:41 AM  GAD 7 : Generalized Anxiety Score  Nervous, Anxious, on Edge 1 2  Control/stop worrying 1 2  Worry too much - different things 2 2  Trouble relaxing 1 2  Restless 1 2  Easily annoyed or irritable 3 3  Afraid - awful  might happen 2 3  Total GAD 7 Score 11 16  Anxiety Difficulty Somewhat difficult      6. Morbid obesity ([place]) Has been watching diet and walking more. Kirke Shaggy and Mancel Parsons are not covered by insurance. Has not tried Orlistat.    7. Hypothyroidism Started on repletion therapy and reports less fatigue and malaise.   Relevant past medical, surgical, family, and social history reviewed and updated as indicated.  Allergies and medications reviewed and updated. Data reviewed: Chart in Epic.   Past Medical History:  Diagnosis Date   Anxiety attack    panic attacks   Depression    GERD (gastroesophageal reflux disease)    Headache    Myocardial infarction [facility])    age 28   Pericarditis    RA (rheumatoid arthritis) ([place])    Still disease, 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[place])    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[place])    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[place])    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[place])     Past Surgical History:  Procedure Laterality Date   CHOLECYSTECTOMY N/A 08/29/2021   Procedure: LAPAROSCOPIC CHOLECYSTECTOMY WITH ICG;  Surgeon: Ileana Roup, MD;  Location: [facility];  Service: General;  Laterality: N/A;   WISDOM TOOTH EXTRACTION      Social History   Socioeconomic History   Marital status: Significant Other    Spouse name: Not on file   Number of children: Not on  file   Years of education: Not on file   Highest education level: Not on file  Occupational History   Not on file  Tobacco Use   Smoking status: Former    Packs/day: 1.00    Years: 5.00    Total pack years: 5.00    Types: Cigarettes, E-cigarettes    Start date: 07/27/2009    Quit date: 12/2019    Years since quitting: 2.1   Smokeless tobacco: Never  Vaping Use   Vaping Use: Every day   Substances: Nicotine, Flavoring  Substance and Sexual Activity   Alcohol use: Never   Drug use: Yes    Types: Marijuana    Comment: marijuana use every day   Sexual activity: Yes    Birth control/protection: None  Other Topics Concern   Not on file  Social  History Narrative   Not on file   Social Determinants of Health   Financial Resource Strain: Not on file  Food Insecurity: Not on file  Transportation Needs: Not on file  Physical Activity: Not on file  Stress: Not on file  Social Connections: Not on file  Intimate Partner Violence: Not on file    Outpatient Encounter Medications as of 02/15/2022  Medication Sig   ARIPiprazole (ABILIFY) 10 MG tablet Take 1 tablet (10 mg total) by mouth daily.   cyclobenzaprine (FLEXERIL) 10 MG tablet Take 1 tablet (10 mg total) by mouth 3 (three) times daily as needed for muscle spasms.   diphenhydrAMINE (BENADRYL) 25 MG tablet Take 25 mg by mouth daily as needed for allergies.   ibuprofen (ADVIL) 200 MG tablet Take 800 mg by mouth every 6 (six) hours as needed for moderate pain.   levothyroxine (SYNTHROID) 25 MCG tablet Take 1 tablet (25 mcg total) by mouth daily.   ondansetron (ZOFRAN) 4 MG tablet Take 1 tablet (4 mg total) by mouth daily as needed for nausea or vomiting.   orlistat (ALLI) 60 MG capsule Take 1 capsule (60 mg total) by mouth 3 (three) times daily with meals.   predniSONE (STERAPRED UNI-PAK 21 TAB) 10 MG (21) TBPK tablet Take as directed   rosuvastatin (CRESTOR) 5 MG tablet Take 1 tablet (5 mg total) by mouth daily.   testosterone cypionate (DEPOTESTOSTERONE CYPIONATE) 200 MG/ML injection SMARTSIG:0.2 Milliliter(s) IM Once a Week   traMADol (ULTRAM) 50 MG tablet Take 1 tablet (50 mg total) by mouth 3 (three) times daily as needed.   Vitamin D, Ergocalciferol, (DRISDOL) 1.25 MG (50000 UNIT) CAPS capsule Take 1 capsule (50,000 Units total) by mouth every 7 (seven) days.   [DISCONTINUED] acetaminophen (TYLENOL) 500 MG tablet Take 1,000 mg by mouth every 6 (six) hours as needed for mild pain or headache.   [DISCONTINUED] ARIPiprazole (ABILIFY) 2 MG tablet Take 2 mg by mouth daily.   [DISCONTINUED] HYDROcodone-acetaminophen (NORCO/VICODIN) 5-325 MG tablet Take 1 tablet by mouth every 6 (six)  hours as needed.   [DISCONTINUED] HYDROcodone-acetaminophen (NORCO/VICODIN) 5-325 MG tablet Take 1 tablet by mouth every 6 (six) hours as needed.   [DISCONTINUED] omeprazole (PRILOSEC) 40 MG capsule Take 40 mg by mouth daily.   ALPRAZolam (XANAX) 0.25 MG tablet Take 0.25 mg by mouth daily as needed for anxiety. (Patient not taking: Reported on 02/15/2022)   aspirin EC 81 MG tablet Take 1 tablet (81 mg total) by mouth daily. Swallow whole. (Patient not taking: Reported on 02/15/2022)   omeprazole (PRILOSEC) 40 MG capsule Take 1 capsule (40 mg total) by mouth daily.   No facility-administered  encounter medications on file as of 02/15/2022.    Allergies  Allergen Reactions   Strawberry Extract Itching and Rash    Review of Systems  Constitutional:  Negative for activity change, appetite change, chills, diaphoresis, fatigue, fever and unexpected weight change.  HENT: Negative.  Negative for sore throat, trouble swallowing and voice change.   Eyes: Negative.   Respiratory:  Negative for cough, choking, chest tightness and shortness of breath.   Cardiovascular:  Negative for chest pain, palpitations and leg swelling.  Gastrointestinal:  Negative for abdominal pain, blood in stool, constipation, diarrhea, nausea and vomiting.       GERD  Endocrine: Negative.   Genitourinary:  Negative for dysuria, frequency and urgency.  Musculoskeletal:  Positive for arthralgias, back pain and gait problem. Negative for joint swelling, myalgias, neck pain and neck stiffness.  Skin: Negative.   Allergic/Immunologic: Negative.   Neurological:  Negative for dizziness, tremors, seizures, syncope, facial asymmetry, speech difficulty, weakness, light-headedness, numbness and headaches.  Hematological: Negative.   Psychiatric/Behavioral:  Negative for confusion, hallucinations, sleep disturbance and suicidal ideas.   All other systems reviewed and are negative.       Objective:  BP 121/79   Pulse 87   Temp  98.8 F (37.1 C)   Ht '5\' 3"'  (1.6 m)   Wt 270 lb (122.5 kg)   LMP  (LMP Unknown) Comment: just started testosterone last week  SpO2 95%   BMI 47.83 kg/m    Wt Readings from Last 3 Encounters:  02/15/22 270 lb (122.5 kg)  01/13/22 264 lb 6.4 oz (119.9 kg)  01/10/22 260 lb (117.9 kg)    Physical Exam Vitals and nursing note reviewed.  Constitutional:      General: He is not in acute distress.    Appearance: Normal appearance. He is morbidly obese. He is not ill-appearing, toxic-appearing or diaphoretic.  HENT:     Head: Normocephalic and atraumatic.     Nose: Nose normal.     Mouth/Throat:     Mouth: Mucous membranes are moist.  Eyes:     Conjunctiva/sclera: Conjunctivae normal.     Pupils: Pupils are equal, round, and reactive to light.  Cardiovascular:     Rate and Rhythm: Normal rate and regular rhythm.     Heart sounds: Normal heart sounds. No murmur heard.    No friction rub. No gallop.  Pulmonary:     Effort: Pulmonary effort is normal.     Breath sounds: Normal breath sounds.  Musculoskeletal:     Right lower leg: No edema.     Left lower leg: No edema.  Skin:    General: Skin is warm and dry.     Capillary Refill: Capillary refill takes less than 2 seconds.  Neurological:     General: No focal deficit present.     Mental Status: He is alert and oriented to person, place, and time.     Gait: Gait abnormal (antalgic).  Psychiatric:        Mood and Affect: Mood normal.        Behavior: Behavior normal.        Thought Content: Thought content normal.        Judgment: Judgment normal.     Results for orders placed or performed in visit on 01/04/22  CMP14+EGFR  Result Value Ref Range   Glucose 106 (H) 70 - 99 mg/dL   BUN 9 6 - 20 mg/dL   Creatinine, Ser 0.91 0.57 - 1.00 mg/dL  eGFR 86 >59 mL/min/1.73   BUN/Creatinine Ratio 10 9 - 23   Sodium 142 134 - 144 mmol/L   Potassium 4.6 3.5 - 5.2 mmol/L   Chloride 105 96 - 106 mmol/L   CO2 21 20 - 29 mmol/L    Calcium 9.4 8.7 - 10.2 mg/dL   Total Protein 7.6 6.0 - 8.5 g/dL   Albumin 4.5 3.8 - 4.8 g/dL   Globulin, Total 3.1 1.5 - 4.5 g/dL   Albumin/Globulin Ratio 1.5 1.2 - 2.2   Bilirubin Total 0.2 0.0 - 1.2 mg/dL   Alkaline Phosphatase 107 44 - 121 IU/L   AST 27 0 - 40 IU/L   ALT 48 (H) 0 - 32 IU/L  CBC with Differential/Platelet  Result Value Ref Range   WBC 11.4 (H) 3.4 - 10.8 x10E3/uL   RBC 4.76 3.77 - 5.28 x10E6/uL   Hemoglobin 13.7 11.1 - 15.9 g/dL   Hematocrit 42.0 34.0 - 46.6 %   MCV 88 79 - 97 fL   MCH 28.8 26.6 - 33.0 pg   MCHC 32.6 31.5 - 35.7 g/dL   RDW 14.3 11.7 - 15.4 %   Platelets 316 150 - 450 x10E3/uL   Neutrophils 71 Not Estab. %   Lymphs 21 Not Estab. %   Monocytes 5 Not Estab. %   Eos 3 Not Estab. %   Basos 0 Not Estab. %   Neutrophils Absolute 8.0 (H) 1.4 - 7.0 x10E3/uL   Lymphocytes Absolute 2.4 0.7 - 3.1 x10E3/uL   Monocytes Absolute 0.6 0.1 - 0.9 x10E3/uL   EOS (ABSOLUTE) 0.4 0.0 - 0.4 x10E3/uL   Basophils Absolute 0.1 0.0 - 0.2 x10E3/uL   Immature Granulocytes 0 Not Estab. %   Immature Grans (Abs) 0.0 0.0 - 0.1 x10E3/uL  Lipid panel  Result Value Ref Range   Cholesterol, Total 153 100 - 199 mg/dL   Triglycerides 106 0 - 149 mg/dL   HDL 47 >39 mg/dL   VLDL Cholesterol Cal 19 5 - 40 mg/dL   LDL Chol Calc (NIH) 87 0 - 99 mg/dL   Chol/HDL Ratio 3.3 0.0 - 4.4 ratio  VITAMIN D 25 Hydroxy (Vit-D Deficiency, Fractures)  Result Value Ref Range   Vit D, 25-Hydroxy 10.0 (L) 30.0 - 100.0 ng/mL  Thyroid Panel With TSH  Result Value Ref Range   TSH 7.750 (H) 0.450 - 4.500 uIU/mL   T4, Total 8.8 4.5 - 12.0 ug/dL   T3 Uptake Ratio 20 (L) 24 - 39 %   Free Thyroxine Index 1.8 1.2 - 4.9  Lipase  Result Value Ref Range   Lipase 24 14 - 72 U/L  Amylase  Result Value Ref Range   Amylase 32 31 - 110 U/L  Magnesium  Result Value Ref Range   Magnesium 2.2 1.6 - 2.3 mg/dL  Bayer DCA Hb A1c Waived  Result Value Ref Range   HB A1C (BAYER DCA - WAIVED) 4.9 4.8 - 5.6 %        Pertinent labs & imaging results that were available during my care of the patient were reviewed by me and considered in my medical decision making.  Assessment & Plan:  [name] was seen today for weight check.  Diagnoses and all orders for this visit:  Chronic bilateral low back pain with right-sided sciatica Has been seen by ortho and is scheduled to get injections. Would like referral to pain management, has seen Dr. Francesco Runner in the past and would like to go back.  -  Ambulatory referral to Pain Clinic  GERD without esophagitis No red flags present. Diet discussed. Avoid fried, spicy, fatty, greasy, and acidic foods. Avoid caffeine, nicotine, and alcohol. Do not eat 2-3 hours before bedtime and stay upright for at least 1-2 hours after eating. Eat small frequent meals. Avoid NSAID's like motrin and aleve. Medications as prescribed. Report any new or worsening symptoms. Follow up as discussed or sooner if needed.   -     omeprazole (PRILOSEC) 40 MG capsule; Take 1 capsule (40 mg total) by mouth daily.  Severe episode of recurrent major depressive disorder, without psychotic features ([place]) GAD (generalized anxiety disorder) Bipolar affective disorder, currently depressed, moderate ([place]) Awaiting to get in to see psychiatry. Will refill Abilify today as pt has yet to receive an appointment. Is seeing counselor on a regular basis.  -     ARIPiprazole (ABILIFY) 10 MG tablet; Take 1 tablet (10 mg total) by mouth daily.  Morbid obesity ([place]) Saxenda and Wegovy not covered by insurance. Will prescribe below. Diet and exercise encouraged.  -     orlistat (ALLI) 60 MG capsule; Take 1 capsule (60 mg total) by mouth 3 (three) times daily with meals.  Hypothyroidism Started on repletion therapy and feels better. Has not been long enough to recheck labs.    Continue all other maintenance medications.  Follow up plan: Return in about 2 months (around 04/18/2022), or if symptoms worsen  or fail to improve, for thyroid.   Continue healthy lifestyle choices, including diet (rich in fruits, vegetables, and lean proteins, and low in salt and simple carbohydrates) and exercise (at least 30 minutes of moderate physical activity daily).  Educational handout given for hypothyroidism  The above assessment and management plan was discussed with the patient. The patient verbalized understanding of and has agreed to the management plan. Patient is aware to call the clinic if they develop any new symptoms or if symptoms persist or worsen. Patient is aware when to return to the clinic for a follow-up visit. Patient educated on when it is appropriate to go to the emergency department.   Monia Pouch, FNP-C [facility] [phone]

## 2022-02-16 ENCOUNTER — Institutional Professional Consult (permissible substitution): Payer: Medicaid Other | Admitting: Neurology

## 2022-02-16 ENCOUNTER — Telehealth: Payer: Self-pay | Admitting: Neurology

## 2022-02-16 ENCOUNTER — Encounter: Payer: Self-pay | Admitting: Neurology

## 2022-02-16 DIAGNOSIS — F411 Generalized anxiety disorder: Secondary | ICD-10-CM | POA: Diagnosis not present

## 2022-02-16 NOTE — Telephone Encounter (Signed)
error 

## 2022-02-24 ENCOUNTER — Other Ambulatory Visit: Payer: Self-pay

## 2022-02-24 DIAGNOSIS — G8929 Other chronic pain: Secondary | ICD-10-CM

## 2022-02-27 ENCOUNTER — Telehealth: Payer: Self-pay | Admitting: Physician Assistant

## 2022-02-27 ENCOUNTER — Other Ambulatory Visit: Payer: Self-pay | Admitting: Physician Assistant

## 2022-02-27 MED ORDER — CYCLOBENZAPRINE HCL 10 MG PO TABS
10.0000 mg | ORAL_TABLET | Freq: Two times a day (BID) | ORAL | 0 refills | Status: DC | PRN
Start: 2022-02-27 — End: 2022-03-06

## 2022-02-27 MED ORDER — TRAMADOL HCL 50 MG PO TABS: 50.0000 mg | ORAL_TABLET | Freq: Three times a day (TID) | ORAL | 2 refills | Status: DC | PRN

## 2022-02-27 NOTE — Telephone Encounter (Signed)
Sent in

## 2022-02-27 NOTE — Telephone Encounter (Signed)
Pt called requesting refill of tramadol and flexural. Pt states she has her 1 st appt with pain management Aug 11 to last her until her appt. Please send to pharmacy on file. Pt phone number is [phone].

## 2022-02-28 DIAGNOSIS — Z419 Encounter for procedure for purposes other than remedying health state, unspecified: Secondary | ICD-10-CM | POA: Diagnosis not present

## 2022-03-01 ENCOUNTER — Ambulatory Visit: Payer: Medicaid Other | Admitting: Neurology

## 2022-03-01 VITALS — BP 155/97 | HR 97 | Ht 63.0 in | Wt 273.0 lb

## 2022-03-01 DIAGNOSIS — R0683 Snoring: Secondary | ICD-10-CM | POA: Diagnosis not present

## 2022-03-01 DIAGNOSIS — R519 Headache, unspecified: Secondary | ICD-10-CM | POA: Diagnosis not present

## 2022-03-01 DIAGNOSIS — G4730 Sleep apnea, unspecified: Secondary | ICD-10-CM

## 2022-03-01 DIAGNOSIS — G4719 Other hypersomnia: Secondary | ICD-10-CM

## 2022-03-01 DIAGNOSIS — R0681 Apnea, not elsewhere classified: Secondary | ICD-10-CM | POA: Diagnosis not present

## 2022-03-01 DIAGNOSIS — R351 Nocturia: Secondary | ICD-10-CM | POA: Diagnosis not present

## 2022-03-01 DIAGNOSIS — R030 Elevated blood-pressure reading, without diagnosis of hypertension: Secondary | ICD-10-CM | POA: Diagnosis not present

## 2022-03-01 DIAGNOSIS — Z6841 Body Mass Index (BMI) 40.0 and over, adult: Secondary | ICD-10-CM

## 2022-03-01 DIAGNOSIS — R635 Abnormal weight gain: Secondary | ICD-10-CM

## 2022-03-01 NOTE — Patient Instructions (Signed)

## 2022-03-01 NOTE — Progress Notes (Signed)
Subjective:    Patient ID: [name] is a 31 y.o. adult.  HPI    Huston Foley, MD, PhD [facility] [address] [address] [place], [place] [address]  Dear [name],   I saw your patient, [name], upon your kind request in my sleep clinic today for initial consultation of his sleep disorder, in particular, concern for underlying obstructive sleep apnea.  The patient is accompanied by his fianc today; he missed an appointment on 02/24/2022.  As you know, [name] is a 31 year old transgender (female to female) man with an underlying medical history of rheumatoid arthritis, pericarditis, Hx of MI, anxiety, depression, mixed hyperlipidemia, elevated liver enzymes, hypothyroidism, and morbid obesity with a BMI of over 45, who reports snoring and excessive daytime somnolence.  I reviewed the office note from 01/04/2022.  His Epworth sleepiness score is 11 out of 24, fatigue severity score is 53 out of 63.  He lives alone, his fiance is moving from [place] to [place] soon.  He has 3 cats in the household.  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  Bedtime generally during the workweek is 10 or 11 AM and rise time around 6 or 7 PM.  His schedule when he is not working is typically 9 to 10 PM in bed and rise time between 6 and 7 AM.  He does have nocturia about twice per average night and has woken up occasionally with a headache.  He quit smoking some 2 years ago but does vape nicotine daily.  He does not currently drink any alcohol and drinks caffeine in the form of coffee, 1 a day typically.  He has been told that he had bradycardia at night.  He has also had apneic sounds and pauses in his breathing while asleep.  He has woken up with a sense of gasping for air.  He has a maternal uncle with sleep apnea.  He had sleep study testing in or around 2019 and it was negative for sleep apnea at the  time per his recollection, testing was done in [place].  Prior test results are not available for my review today.  He reports a significant amount of weight gain in the interim in the realm of 40 pounds.  He is currently on testosterone injections.    His Past Medical History Is Significant For: Past Medical History:  Diagnosis Date   Anxiety attack    panic attacks   Depression    GERD (gastroesophageal reflux disease)    Headache    Myocardial infarction [facility])    age 62   Pericarditis    RA (rheumatoid arthritis) (HCC)    Still disease, 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HCC)    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HCC)    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HCC)    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HCC)     His Past Surgical History Is Significant For: Past Surgical History:  Procedure Laterality Date   CHOLECYSTECTOMY N/A 08/29/2021   Procedure: LAPAROSCOPIC CHOLECYSTECTOMY WITH ICG;  Surgeon: Andria Meuse, MD;  Location: [facility];  Service: General;  Laterality: N/A;   WISDOM TOOTH EXTRACTION      His Family History Is Significant For: Family History  Problem Relation Age of Onset   Heart disease Mother    Heart attack Mother    Depression Father    Rheum arthritis Father    Hypertension Father    Bipolar  disorder Brother     His Social History Is Significant For: Social History   Socioeconomic History   Marital status: Significant Other    Spouse name: Not on file   Number of children: Not on file   Years of education: Not on file   Highest education level: Not on file  Occupational History   Not on file  Tobacco Use   Smoking status: Former    Packs/day: 1.00    Years: 5.00    Total pack years: 5.00    Types: Cigarettes, E-cigarettes    Start date: 07/27/2009    Quit date: 12/2019    Years since quitting: 2.1   Smokeless tobacco: Never  Vaping Use   Vaping Use: Every day   Substances: Nicotine, Flavoring  Substance and Sexual Activity   Alcohol use: Never   Drug use: Yes    Types: Marijuana    Comment:  marijuana use every day   Sexual activity: Yes    Birth control/protection: None  Other Topics Concern   Not on file  Social History Narrative   Not on file   Social Determinants of Health   Financial Resource Strain: Not on file  Food Insecurity: Not on file  Transportation Needs: Not on file  Physical Activity: Not on file  Stress: Not on file  Social Connections: Not on file    His Allergies Are:  Allergies  Allergen Reactions   Strawberry Extract Itching and Rash  :   His Current Medications Are:  Outpatient Encounter Medications as of 03/01/2022  Medication Sig   ALPRAZolam (XANAX) 0.25 MG tablet Take 0.25 mg by mouth daily as needed for anxiety.   ARIPiprazole (ABILIFY) 10 MG tablet Take 1 tablet (10 mg total) by mouth daily.   aspirin EC 81 MG tablet Take 1 tablet (81 mg total) by mouth daily. Swallow whole.   cyclobenzaprine (FLEXERIL) 10 MG tablet Take 1 tablet (10 mg total) by mouth 2 (two) times daily as needed for muscle spasms.   diphenhydrAMINE (BENADRYL) 25 MG tablet Take 25 mg by mouth daily as needed for allergies.   ibuprofen (ADVIL) 200 MG tablet Take 800 mg by mouth every 6 (six) hours as needed for moderate pain.   levothyroxine (SYNTHROID) 25 MCG tablet Take 1 tablet (25 mcg total) by mouth daily.   omeprazole (PRILOSEC) 40 MG capsule Take 1 capsule (40 mg total) by mouth daily.   ondansetron (ZOFRAN) 4 MG tablet Take 1 tablet (4 mg total) by mouth daily as needed for nausea or vomiting.   orlistat (ALLI) 60 MG capsule Take 1 capsule (60 mg total) by mouth 3 (three) times daily with meals.   predniSONE (STERAPRED UNI-PAK 21 TAB) 10 MG (21) TBPK tablet Take as directed   rosuvastatin (CRESTOR) 5 MG tablet Take 1 tablet (5 mg total) by mouth daily.   testosterone cypionate (DEPOTESTOSTERONE CYPIONATE) 200 MG/ML injection SMARTSIG:0.2 Milliliter(s) IM Once a Week   traMADol (ULTRAM) 50 MG tablet Take 1 tablet (50 mg total) by mouth 3 (three) times daily as  needed.   Vitamin D, Ergocalciferol, (DRISDOL) 1.25 MG (50000 UNIT) CAPS capsule Take 1 capsule (50,000 Units total) by mouth every 7 (seven) days.   No facility-administered encounter medications on file as of 03/01/2022.  :   Review of Systems:  Out of a complete 14 point review of systems, all are reviewed and negative with the exception of these symptoms as listed below:   Review of Systems  Neurological:  Here for sleep consult. Pt reports prior sleep study in 2019 no sleep apnea found.  Reports snoring is present at night and occasional morning headaches. Steffanie Rainwater also report at times he will gasp for air and odd breathing patterns.     Objective:  Neurological Exam  Physical Exam Physical Examination:   Vitals:   03/01/22 1106  BP: (!) 155/97  Pulse: 97    General Examination: The patient is a very pleasant 31 y.o. female in no acute distress. He appears well-developed and well-nourished and well groomed.   HEENT: Normocephalic, atraumatic, pupils are equal, round and reactive to light, extraocular tracking is good without limitation to gaze excursion or nystagmus noted. Hearing is grossly intact. Face is symmetric with normal facial animation. Speech is clear with no dysarthria noted. There is no hypophonia. There is no lip, neck/head, jaw or voice tremor. Neck is supple with full range of passive and active motion. There are no carotid bruits on auscultation. Oropharynx exam reveals: mild mouth dryness, adequate dental hygiene and moderate airway crowding, due to tonsillar size of 2-3+ bilaterally.  Prominent uvula, small airway entry.  Mallampati class III.  Neck circumference of 19 inches.  Tongue protrudes centrally and palate elevates symmetrically, no significant overbite, minimal.  Chest: Clear to auscultation without wheezing, rhonchi or crackles noted.  Heart: S1+S2+0, regular and normal without murmurs, rubs or gallops noted.   Abdomen: Soft, non-tender and  non-distended.  Extremities: There is no pitting edema in the distal lower extremities bilaterally.   Skin: Warm and dry without trophic changes noted.   Musculoskeletal: exam reveals bilateral pain and a limp while walking.     Neurologically:  Mental status: The patient is awake, alert and oriented in all 4 spheres. His immediate and remote memory, attention, language skills and fund of knowledge are appropriate. There is no evidence of aphasia, agnosia, apraxia or anomia. Speech is clear with normal prosody and enunciation. Thought process is linear. Mood is normal and affect is normal.  Cranial nerves II - XII are as described above under HEENT exam.  Motor exam: Normal bulk, strength and tone is noted. There is no obvious tremor. Fine motor skills and coordination: grossly intact.  Cerebellar testing: No dysmetria or intention tremor. There is no truncal or gait ataxia.  Sensory exam: intact to light touch in the upper and lower extremities.  Gait, station and balance: He stands with mild difficulty and pushes himself up.  Walks with a limp, no walking aid.   Assessment and Plan:  In summary, Celester M Lazare is a very pleasant 31 y.o.-year old female with an underlying medical history of rheumatoid arthritis, pericarditis, Hx of MI, anxiety, depression, mixed hyperlipidemia, elevated liver enzymes, hypothyroidism, and morbid obesity with a BMI of over 45, whose history and physical exam concerning for sleep disordered breathing, supporting a current working diagnosis of unspecified sleep apnea, with the main differential diagnoses of obstructive sleep apnea (OSA) versus upper airway resistance syndrome (UARS) versus central sleep apnea (CSA), or mixed sleep apnea. A laboratory attended sleep study is considered gold standard for evaluation of sleep disordered breathing and is recommended at this time and clinically justified.   I had a long chat with the patient and his fiancee about my findings  and the diagnosis of sleep apnea, particularly OSA, its prognosis and treatment options. We talked about medical/conservative treatments, surgical interventions and non-pharmacological approaches for symptom control. I explained, in particular, the risks and ramifications of untreated moderate to severe  OSA, especially with respect to developing cardiovascular disease down the road, including congestive heart failure (CHF), difficult to treat hypertension, cardiac arrhythmias (particularly A-fib), neurovascular complications including TIA, stroke and dementia. Even type 2 diabetes has, in part, been linked to untreated OSA. Symptoms of untreated OSA may include (but may not be limited to) daytime sleepiness, nocturia (i.e. frequent nighttime urination), memory problems, mood irritability and suboptimally controlled or worsening mood disorder such as depression and/or anxiety, lack of energy, lack of motivation, physical discomfort, as well as recurrent headaches, especially morning or nocturnal headaches. We talked about the importance of maintaining a healthy lifestyle and striving for healthy weight.  The importance of complete nicotine cessation was also addressed.  In addition, we talked about the importance of striving for and maintaining good sleep hygiene. I recommended the following at this time: sleep study.  I outlined the differences between a laboratory attended sleep study which is considered more comprehensive and accurate over the option of a home sleep test (HST); the latter may lead to underestimation of sleep disordered breathing in some instances and does not help with diagnosing upper airway resistance syndrome and is not accurate enough to diagnose primary central sleep apnea typically. I explained the different sleep test procedures to the patient in detail and also outlined possible surgical and non-surgical treatment options of OSA, including the use of a pressure airway pressure (PAP)  device (ie CPAP, AutoPAP/APAP or BiPAP in certain circumstances), a custom-made dental device (aka oral appliance, which would require a referral to a specialist dentist or orthodontist typically, and is generally speaking not considered a good choice for patients with full dentures or edentulous state), upper airway surgical options, such as traditional UPPP (which is not considered a first-line treatment) or the Inspire device (hypoglossal nerve stimulator, which would involve a referral for consultation with an ENT surgeon, after careful selection, following inclusion criteria). I explained the PAP treatment option to the patient in detail, as this is generally considered first-line treatment.  The patient indicated that he would be willing to try PAP therapy, if the need arises. I explained the importance of being compliant with PAP treatment, not only for insurance purposes but primarily to improve patient's symptoms symptoms, and for the patient's long term health benefit, including to reduce His cardiovascular risks longer-term.    We will pick up our discussion about the next steps and treatment options after testing.  We will keep him posted as to the test results by phone call and/or MyChart messaging where possible.  We will plan to follow-up in sleep clinic accordingly as well.  I answered all their questions today and the patient and his fiancee were in agreement.   I encouraged him to call with any interim questions, concerns, problems or updates or email [name] through MyChart.  Generally speaking, sleep test authorizations may take up to 2 weeks, sometimes less, sometimes longer, the patient is encouraged to get in touch with [name] if they do not hear back from the sleep lab staff directly within the next 2 weeks.  Thank you very much for allowing me to participate in the care of this nice patient. If I can be of any further assistance to you please do not hesitate to call me at  [phone].  Sincerely,   Huston Foley, MD, PhD

## 2022-03-03 DIAGNOSIS — F411 Generalized anxiety disorder: Secondary | ICD-10-CM | POA: Diagnosis not present

## 2022-03-05 ENCOUNTER — Other Ambulatory Visit: Payer: Self-pay | Admitting: Psychiatry

## 2022-03-05 ENCOUNTER — Ambulatory Visit (HOSPITAL_COMMUNITY)
Admission: EM | Admit: 2022-03-05 | Discharge: 2022-03-06 | Disposition: A | Discharge status: Other Acute Inpatient Hospital | Payer: Medicaid Other | Attending: Urology | Admitting: Urology

## 2022-03-05 ENCOUNTER — Ambulatory Visit (HOSPITAL_COMMUNITY): Admission: AD | Admit: 2022-03-05 | Payer: Medicaid Other | Source: Home / Self Care | Admitting: Psychiatry

## 2022-03-05 DIAGNOSIS — F3130 Bipolar disorder, current episode depressed, mild or moderate severity, unspecified: Secondary | ICD-10-CM

## 2022-03-05 DIAGNOSIS — Z20822 Contact with and (suspected) exposure to covid-19: Secondary | ICD-10-CM | POA: Insufficient documentation

## 2022-03-05 DIAGNOSIS — F314 Bipolar disorder, current episode depressed, severe, without psychotic features: Secondary | ICD-10-CM | POA: Insufficient documentation

## 2022-03-05 DIAGNOSIS — F17200 Nicotine dependence, unspecified, uncomplicated: Secondary | ICD-10-CM | POA: Insufficient documentation

## 2022-03-05 DIAGNOSIS — Z79899 Other long term (current) drug therapy: Secondary | ICD-10-CM | POA: Insufficient documentation

## 2022-03-05 DIAGNOSIS — R45851 Suicidal ideations: Secondary | ICD-10-CM | POA: Insufficient documentation

## 2022-03-05 DIAGNOSIS — Z008 Encounter for other general examination: Secondary | ICD-10-CM

## 2022-03-05 LAB — SARS CORONAVIRUS 2 BY RT PCR: SARS Coronavirus 2 by RT PCR: NEGATIVE

## 2022-03-05 MED ORDER — MAGNESIUM HYDROXIDE 400 MG/5ML PO SUSP: 30.0000 mL | Freq: Every day | ORAL | Status: DC | PRN

## 2022-03-05 MED ORDER — ACETAMINOPHEN 325 MG PO TABS
650.0000 mg | ORAL_TABLET | Freq: Four times a day (QID) | ORAL | Status: DC | PRN
  Administered 2022-03-06: 650 mg via ORAL
  Filled 2022-03-05: qty 2

## 2022-03-05 MED ORDER — TRAZODONE HCL 50 MG PO TABS: 50.0000 mg | ORAL_TABLET | Freq: Every evening | ORAL | Status: DC | PRN

## 2022-03-05 MED ORDER — HYDROXYZINE HCL 25 MG PO TABS
25.0000 mg | ORAL_TABLET | Freq: Three times a day (TID) | ORAL | Status: DC | PRN
  Administered 2022-03-06: 25 mg via ORAL
  Filled 2022-03-05: qty 1

## 2022-03-05 MED ORDER — ALUM & MAG HYDROXIDE-SIMETH 200-200-20 MG/5ML PO SUSP: 30.0000 mL | ORAL | Status: DC | PRN
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
## 2022-03-05 NOTE — Progress Notes (Signed)
Pt complains of chest pain at a level of 5 of 10. States he believes this is related to anxiety as this is a common feeling when he is anxious.  Also has leg pain that he says is from the need for bilateral hip replacement. Is on weight loss program so that he can have hip replacement surgery. Able to ambulate but uses rolling walker prn.

## 2022-03-06 ENCOUNTER — Other Ambulatory Visit: Payer: Self-pay

## 2022-03-06 ENCOUNTER — Encounter (HOSPITAL_COMMUNITY): Payer: Self-pay | Admitting: Student

## 2022-03-06 ENCOUNTER — Inpatient Hospital Stay (HOSPITAL_COMMUNITY)
Admission: AD | Admit: 2022-03-06 | Discharge: 2022-03-09 | DRG: 885 | Disposition: A | Discharge status: Home / Self Care | Payer: Medicaid Other | Source: Intra-hospital | Attending: Emergency Medicine | Admitting: Emergency Medicine

## 2022-03-06 DIAGNOSIS — F4010 Social phobia, unspecified: Secondary | ICD-10-CM | POA: Diagnosis not present

## 2022-03-06 DIAGNOSIS — M5431 Sciatica, right side: Secondary | ICD-10-CM | POA: Diagnosis not present

## 2022-03-06 DIAGNOSIS — Z8261 Family history of arthritis: Secondary | ICD-10-CM

## 2022-03-06 DIAGNOSIS — I252 Old myocardial infarction: Secondary | ICD-10-CM | POA: Diagnosis not present

## 2022-03-06 DIAGNOSIS — Z79891 Long term (current) use of opiate analgesic: Secondary | ICD-10-CM | POA: Diagnosis not present

## 2022-03-06 DIAGNOSIS — K219 Gastro-esophageal reflux disease without esophagitis: Secondary | ICD-10-CM | POA: Diagnosis present

## 2022-03-06 DIAGNOSIS — F17290 Nicotine dependence, other tobacco product, uncomplicated: Secondary | ICD-10-CM | POA: Diagnosis not present

## 2022-03-06 DIAGNOSIS — F319 Bipolar disorder, unspecified: Secondary | ICD-10-CM | POA: Diagnosis present

## 2022-03-06 DIAGNOSIS — F3132 Bipolar disorder, current episode depressed, moderate: Secondary | ICD-10-CM | POA: Diagnosis not present

## 2022-03-06 DIAGNOSIS — Z79890 Hormone replacement therapy (postmenopausal): Secondary | ICD-10-CM

## 2022-03-06 DIAGNOSIS — M0820 Juvenile rheumatoid arthritis with systemic onset, unspecified site: Secondary | ICD-10-CM | POA: Diagnosis present

## 2022-03-06 DIAGNOSIS — G4700 Insomnia, unspecified: Secondary | ICD-10-CM | POA: Diagnosis not present

## 2022-03-06 DIAGNOSIS — Z9049 Acquired absence of other specified parts of digestive tract: Secondary | ICD-10-CM

## 2022-03-06 DIAGNOSIS — F332 Major depressive disorder, recurrent severe without psychotic features: Secondary | ICD-10-CM

## 2022-03-06 DIAGNOSIS — F411 Generalized anxiety disorder: Secondary | ICD-10-CM | POA: Diagnosis not present

## 2022-03-06 DIAGNOSIS — Z818 Family history of other mental and behavioral disorders: Secondary | ICD-10-CM

## 2022-03-06 DIAGNOSIS — Z79899 Other long term (current) drug therapy: Secondary | ICD-10-CM

## 2022-03-06 DIAGNOSIS — F1290 Cannabis use, unspecified, uncomplicated: Secondary | ICD-10-CM | POA: Diagnosis present

## 2022-03-06 DIAGNOSIS — Z9102 Food additives allergy status: Secondary | ICD-10-CM

## 2022-03-06 DIAGNOSIS — Z720 Tobacco use: Secondary | ICD-10-CM | POA: Diagnosis present

## 2022-03-06 LAB — COMPREHENSIVE METABOLIC PANEL
ALT: 34 U/L (ref 0–44)
AST: 19 U/L (ref 15–41)
Albumin: 3.6 g/dL (ref 3.5–5.0)
Alkaline Phosphatase: 62 U/L (ref 38–126)
Anion gap: 7 (ref 5–15)
BUN: 8 mg/dL (ref 6–20)
CO2: 22 mmol/L (ref 22–32)
Calcium: 9.1 mg/dL (ref 8.9–10.3)
Chloride: 109 mmol/L (ref 98–111)
Creatinine, Ser: 0.9 mg/dL (ref 0.44–1.00)
GFR, Estimated: >60 mL/min (ref >60)
Glucose, Bld: 101 mg/dL — ABNORMAL HIGH (ref 70–99)
Potassium: 3.9 mmol/L (ref 3.5–5.1)
Sodium: 138 mmol/L (ref 135–145)
Total Bilirubin: 0.7 mg/dL (ref 0.3–1.2)
Total Protein: 6.4 g/dL — ABNORMAL LOW (ref 6.5–8.1)

## 2022-03-06 LAB — CBC WITH DIFFERENTIAL/PLATELET
Abs Immature Granulocytes: 0.04 10*3/uL (ref 0.00–0.07)
Basophils Absolute: 0.1 10*3/uL (ref 0.0–0.1)
Basophils Relative: 1 %
Eosinophils Absolute: 0.4 10*3/uL (ref 0.0–0.5)
Eosinophils Relative: 4 %
HCT: 37.7 % (ref 36.0–46.0)
Hemoglobin: 12.6 g/dL (ref 12.0–15.0)
Immature Granulocytes: 0 %
Lymphocytes Relative: 27 %
Lymphs Abs: 3 10*3/uL (ref 0.7–4.0)
MCH: 29 pg (ref 26.0–34.0)
MCHC: 33.4 g/dL (ref 30.0–36.0)
MCV: 86.9 fL (ref 80.0–100.0)
Monocytes Absolute: 0.8 10*3/uL (ref 0.1–1.0)
Monocytes Relative: 7 %
Neutro Abs: 6.8 10*3/uL (ref 1.7–7.7)
Neutrophils Relative %: 61 %
Platelets: 281 10*3/uL (ref 150–400)
RBC: 4.34 MIL/uL (ref 3.87–5.11)
RDW: 14.1 % (ref 11.5–15.5)
WBC: 11.1 10*3/uL — ABNORMAL HIGH (ref 4.0–10.5)
nRBC: 0 % (ref 0.0–0.2)

## 2022-03-06 LAB — POCT URINE DRUG SCREEN - MANUAL ENTRY (I-SCREEN)
POC Amphetamine UR: NOT DETECTED
POC Buprenorphine (BUP): NOT DETECTED
POC Cocaine UR: NOT DETECTED
POC Marijuana UR: POSITIVE — AB
POC Methadone UR: NOT DETECTED
POC Methamphetamine UR: NOT DETECTED
POC Morphine: NOT DETECTED
POC Oxazepam (BZO): POSITIVE — AB
POC Oxycodone UR: NOT DETECTED
POC Secobarbital (BAR): NOT DETECTED

## 2022-03-06 LAB — LIPID PANEL
Cholesterol: 185 mg/dL (ref 0–200)
HDL: 36 mg/dL — ABNORMAL LOW (ref >40)
LDL Cholesterol: 129 mg/dL — ABNORMAL HIGH (ref 0–99)
Total CHOL/HDL Ratio: 5.1 RATIO
Triglycerides: 102 mg/dL (ref <150)
VLDL: 20 mg/dL (ref 0–40)

## 2022-03-06 LAB — POCT PREGNANCY, URINE: Preg Test, Ur: NEGATIVE

## 2022-03-06 LAB — TSH: TSH: 4.044 u[IU]/mL (ref 0.350–4.500)

## 2022-03-06 LAB — HEMOGLOBIN A1C
Hgb A1c MFr Bld: 4.9 % (ref 4.8–5.6)
Mean Plasma Glucose: 93.93 mg/dL

## 2022-03-06 LAB — ETHANOL: Alcohol, Ethyl (B): <10 mg/dL (ref <10)

## 2022-03-06 MED ORDER — NICOTINE 21 MG/24HR TD PT24
21.0000 mg | MEDICATED_PATCH | Freq: Every day | TRANSDERMAL | Status: DC
Start: 2022-03-07 — End: 2022-03-09
  Administered 2022-03-07: 21 mg via TRANSDERMAL
  Filled 2022-03-06 (×5): qty 1

## 2022-03-06 MED ORDER — IBUPROFEN 800 MG PO TABS
800.0000 mg | ORAL_TABLET | Freq: Three times a day (TID) | ORAL | Status: DC | PRN
  Administered 2022-03-06 (×3): 800 mg via ORAL
  Filled 2022-03-06 (×3): qty 1

## 2022-03-06 MED ORDER — PANTOPRAZOLE SODIUM 40 MG PO TBEC
40.0000 mg | DELAYED_RELEASE_TABLET | Freq: Every day | ORAL | Status: DC
  Administered 2022-03-06: 40 mg via ORAL
  Filled 2022-03-06: qty 1

## 2022-03-06 MED ORDER — ALUM & MAG HYDROXIDE-SIMETH 200-200-20 MG/5ML PO SUSP: 30.0000 mL | ORAL | Status: DC | PRN

## 2022-03-06 MED ORDER — ROSUVASTATIN CALCIUM 5 MG PO TABS
5.0000 mg | ORAL_TABLET | Freq: Every day | ORAL | Status: DC
  Administered 2022-03-07 – 2022-03-08 (×2): 5 mg via ORAL
  Filled 2022-03-06 (×5): qty 1

## 2022-03-06 MED ORDER — VITAMIN D (ERGOCALCIFEROL) 1.25 MG (50000 UNIT) PO CAPS
50000.0000 [IU] | ORAL_CAPSULE | ORAL | Status: DC
  Administered 2022-03-06: 50000 [IU] via ORAL
  Filled 2022-03-06: qty 1

## 2022-03-06 MED ORDER — LEVOTHYROXINE SODIUM 25 MCG PO TABS
25.0000 ug | ORAL_TABLET | Freq: Every day | ORAL | Status: DC
  Administered 2022-03-07 – 2022-03-09 (×3): 25 ug via ORAL
  Filled 2022-03-06 (×5): qty 1

## 2022-03-06 MED ORDER — ROSUVASTATIN CALCIUM 5 MG PO TABS
5.0000 mg | ORAL_TABLET | Freq: Every day | ORAL | Status: DC
  Administered 2022-03-06: 5 mg via ORAL
  Filled 2022-03-06: qty 1

## 2022-03-06 MED ORDER — ACETAMINOPHEN 325 MG PO TABS
650.0000 mg | ORAL_TABLET | Freq: Four times a day (QID) | ORAL | Status: DC | PRN
  Administered 2022-03-06 – 2022-03-08 (×5): 650 mg via ORAL
  Filled 2022-03-06 (×4): qty 2

## 2022-03-06 MED ORDER — NICOTINE 21 MG/24HR TD PT24: 21.0000 mg | MEDICATED_PATCH | Freq: Every day | TRANSDERMAL | Status: DC

## 2022-03-06 MED ORDER — LEVOTHYROXINE SODIUM 25 MCG PO TABS
25.0000 ug | ORAL_TABLET | Freq: Every day | ORAL | Status: DC
  Administered 2022-03-06: 25 ug via ORAL
  Filled 2022-03-06: qty 1

## 2022-03-06 MED ORDER — MAGNESIUM HYDROXIDE 400 MG/5ML PO SUSP: 30.0000 mL | Freq: Every day | ORAL | Status: DC | PRN

## 2022-03-06 MED ORDER — NICOTINE 21 MG/24HR TD PT24: 21.0000 mg | MEDICATED_PATCH | Freq: Every day | TRANSDERMAL | 0 refills | Status: DC

## 2022-03-06 MED ORDER — TRAZODONE HCL 50 MG PO TABS
50.0000 mg | ORAL_TABLET | Freq: Every evening | ORAL | Status: DC | PRN
  Administered 2022-03-06 – 2022-03-07 (×2): 50 mg via ORAL
  Filled 2022-03-06: qty 1

## 2022-03-06 MED ORDER — ARIPIPRAZOLE 10 MG PO TABS
10.0000 mg | ORAL_TABLET | Freq: Every day | ORAL | Status: DC
  Administered 2022-03-06: 10 mg via ORAL
  Filled 2022-03-06: qty 1

## 2022-03-06 MED ORDER — ARIPIPRAZOLE 10 MG PO TABS
10.0000 mg | ORAL_TABLET | Freq: Every day | ORAL | Status: DC
  Administered 2022-03-07 – 2022-03-09 (×3): 10 mg via ORAL
  Filled 2022-03-06 (×5): qty 1

## 2022-03-06 MED ORDER — ACETAMINOPHEN 325 MG PO TABS
ORAL_TABLET | ORAL | Status: AC
  Filled 2022-03-06: qty 2

## 2022-03-06 MED ORDER — PANTOPRAZOLE SODIUM 40 MG PO TBEC
40.0000 mg | DELAYED_RELEASE_TABLET | Freq: Every day | ORAL | Status: DC
  Administered 2022-03-07 – 2022-03-09 (×3): 40 mg via ORAL
  Filled 2022-03-06 (×5): qty 1

## 2022-03-06 MED ORDER — TRAZODONE HCL 50 MG PO TABS: 50.0000 mg | ORAL_TABLET | Freq: Every evening | ORAL | Status: DC | PRN

## 2022-03-06 MED ORDER — NICOTINE 21 MG/24HR TD PT24
21.0000 mg | MEDICATED_PATCH | Freq: Every day | TRANSDERMAL | Status: DC
  Administered 2022-03-06: 21 mg via TRANSDERMAL
  Filled 2022-03-06: qty 1

## 2022-03-06 MED ORDER — HYDROXYZINE HCL 25 MG PO TABS
25.0000 mg | ORAL_TABLET | Freq: Three times a day (TID) | ORAL | Status: DC | PRN
  Administered 2022-03-07 – 2022-03-08 (×4): 25 mg via ORAL
  Filled 2022-03-06 (×5): qty 1

## 2022-03-06 NOTE — Tx Team (Signed)
Initial Treatment Plan 03/06/2022 11:06 PM [name] [id number]    PATIENT STRESSORS: Financial difficulties   Medication change or noncompliance     PATIENT STRENGTHS: Ability for insight  Average or above average intelligence  Financial means  Special hobby/interest  Supportive family/friends    PATIENT IDENTIFIED PROBLEMS:     Medication noncompliance  depression  Suicidal ideation  "Feel better"  "Get back on medication'         DISCHARGE CRITERIA:  Improved stabilization in mood, thinking, and/or behavior Need for constant or close observation no longer present Verbal commitment to aftercare and medication compliance  PRELIMINARY DISCHARGE PLAN: Outpatient therapy Return to previous living arrangement  PATIENT/FAMILY INVOLVEMENT: This treatment plan has been presented to and reviewed with the patient, [name],  The patient and family have been given the opportunity to ask questions and make suggestions.  Earnest Conroy, RN 03/06/2022, 11:06 PM

## 2022-03-06 NOTE — ED Notes (Signed)
Pt is currently sleeping. Breathing even and unlabored.  Staff will continue to monitor for safety.  

## 2022-03-06 NOTE — ED Notes (Signed)
Vol consent signed and faxed to [facility] 

## 2022-03-06 NOTE — ED Provider Notes (Signed)
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 Final   Chloride 12/09/2021 105  98 - 111 mmol/L Final   CO2 12/09/2021 24  22 - 32 mmol/L Final   Glucose, Bld 12/09/2021 107 (H)  70 - 99 mg/dL Final   Glucose reference range applies only to samples taken after fasting for at least 8 hours.   BUN 12/09/2021 10  6 - 20 mg/dL Final   Creatinine, Ser 12/09/2021 1.00  0.44 - 1.00 mg/dL Final   Calcium  12/09/2021 9.2  8.9 - 10.3 mg/dL Final   GFR, Estimated 12/09/2021 >60  >60 mL/min Final   Comment: (NOTE) Calculated using the CKD-EPI Creatinine Equation (2021)    Anion gap 12/09/2021 7  5 - 15 Final   Performed at [facility], [address], [place], [place] [address]   Specimen Description 12/10/2021 RIGHT ANTECUBITAL   Final   Special Requests 12/10/2021 BOTTLES DRAWN AEROBIC AND ANAEROBIC Blood Culture adequate volume   Final   Culture 12/10/2021    Final                   Value:NO GROWTH 5 DAYS Performed at [facility], [address], [place], [place] [address]    Report Status 12/10/2021 12/15/2021 FINAL   Final   Specimen Description 12/10/2021 LEFT ANTECUBITAL   Final   Special Requests 12/10/2021 BOTTLES DRAWN AEROBIC AND ANAEROBIC Blood Culture adequate volume   Final   Culture 12/10/2021    Final                   Value:NO GROWTH 5 DAYS Performed at [facility], [address], [place], [place] [address]    Report Status 12/10/2021 12/15/2021 FINAL   Final  Admission on 09/25/2021, Discharged on 09/25/2021  Component Date Value Ref Range Status   WBC 09/25/2021 9.8  4.0 - 10.5 K/uL Final   RBC 09/25/2021 4.85  3.87 - 5.11 MIL/uL Final   Hemoglobin 09/25/2021 14.1  12.0 - 15.0 g/dL Final   HCT 09/25/2021 43.4  36.0 - 46.0 % Final   MCV 09/25/2021 89.5  80.0 - 100.0 fL Final  MCH 09/25/2021 29.1  26.0 - 34.0 pg Final   MCHC 09/25/2021 32.5  30.0 - 36.0 g/dL Final   RDW 09/25/2021 14.6  11.5 - 15.5 % Final   Platelets 09/25/2021 258  150 - 400 K/uL Final   nRBC 09/25/2021 0.0  0.0 - 0.2 % Final   Neutrophils Relative % 09/25/2021 65  % Final   Neutro Abs 09/25/2021 6.5  1.7 - 7.7 K/uL Final   Lymphocytes Relative 09/25/2021 25  % Final   Lymphs Abs 09/25/2021 2.5  0.7 - 4.0 K/uL Final   Monocytes Relative 09/25/2021 5  % Final   Monocytes Absolute 09/25/2021 0.4  0.1 - 1.0 K/uL Final   Eosinophils Relative 09/25/2021 4  % Final   Eosinophils Absolute 09/25/2021  0.4  0.0 - 0.5 K/uL Final   Basophils Relative 09/25/2021 1  % Final   Basophils Absolute 09/25/2021 0.1  0.0 - 0.1 K/uL Final   Immature Granulocytes 09/25/2021 0  % Final   Abs Immature Granulocytes 09/25/2021 0.02  0.00 - 0.07 K/uL Final   Performed at [facility], [address], [place], [place] [address]   Sodium 09/25/2021 136  135 - 145 mmol/L Final   Potassium 09/25/2021 4.2  3.5 - 5.1 mmol/L Final   Chloride 09/25/2021 106  98 - 111 mmol/L Final   CO2 09/25/2021 23  22 - 32 mmol/L Final   Glucose, Bld 09/25/2021 95  70 - 99 mg/dL Final   Glucose reference range applies only to samples taken after fasting for at least 8 hours.   BUN 09/25/2021 11  6 - 20 mg/dL Final   Creatinine, Ser 09/25/2021 0.87  0.44 - 1.00 mg/dL Final   Calcium 09/25/2021 9.1  8.9 - 10.3 mg/dL Final   Total Protein 09/25/2021 7.7  6.5 - 8.1 g/dL Final   Albumin 09/25/2021 3.9  3.5 - 5.0 g/dL Final   AST 09/25/2021 23  15 - 41 U/L Final   ALT 09/25/2021 36  0 - 44 U/L Final   Alkaline Phosphatase 09/25/2021 64  38 - 126 U/L Final   Total Bilirubin 09/25/2021 0.7  0.3 - 1.2 mg/dL Final   GFR, Estimated 09/25/2021 >60  >60 mL/min Final   Comment: (NOTE) Calculated using the CKD-EPI Creatinine Equation (2021)    Anion gap 09/25/2021 7  5 - 15 Final   Performed at [facility], [address], [place], [place] [address]   Lipase 09/25/2021 26  11 - 51 U/L Final   Performed at [facility], [address], [place], [place] [address]   hCG, Ernst Spell Chain, Quant, S 09/25/2021 <1  <5 mIU/mL Final   Comment:          GEST. AGE      CONC.  (mIU/mL)   <=1 WEEK        5 - 50     2 WEEKS       50 - 500     3 WEEKS       100 - 10,000     4 WEEKS     1,000 - 30,000     5 WEEKS     3,500 - 115,000   6-8 WEEKS     12,000 - 270,000    12 WEEKS     15,000 - 220,000        FEMALE AND NON-PREGNANT FEMALE:     LESS THAN 5 mIU/mL Performed at [facility], [address] [place], [place] [address]  Allergies:  Strawberry extract  PTA Medications: (Not in a hospital admission)   Medical Decision Making  Patient is recommended for inpatient treatment for crisis management, stabilization and safety. Patient will be transferred to [facility] for continuous assessment while awaiting a bed at [facility] on 03/06/22. Lab Orders         CBC with Differential/Platelet         Comprehensive metabolic panel         Hemoglobin A1c         Ethanol         Lipid panel         TSH         Pregnancy, urine         POCT Urine Drug Screen - (I-Screen)     Resume home medications    Recommendations  Based on my evaluation the patient does not appear to have an emergency medical condition.  Ophelia Shoulder, NP 03/06/22  12:40 AM

## 2022-03-06 NOTE — ED Notes (Signed)
Pt sleeping quietly breathing even and unlabored.  Pt is in view of Nursing station.  Will continue to monitor for safety and await dispo.

## 2022-03-06 NOTE — Progress Notes (Signed)
Patient ID: [name], adult   DOB: Nov 21, 1990, 31 y.o.   MRN: [id number] Admission note: Patient is a voluntary admission in no acute distress for depression and SI with no plan. Pt stated she stopped taking her medication about a year ago because she was tired of taking then and it was not as effective. Pt endorses AH seeing full figure shadows. Pt uses a walker for ambulation due to herniated disc. Pt is denies no past suicidal attempt by self harm. Pt admitted to unit per protocol, skin assessment and belonging search done. No skin issues noted. Consent signed by pt. Pt educated on therapeutic milieu rules. Pt was introduced to milieu by nursing staff. Fall risk / suicide safety plan explained to the patient. 15 minutes checks started for safety.

## 2022-03-06 NOTE — ED Notes (Signed)
Pt is sleeping soundly no signs of distress or sleep disturbance noted respiration are easy and skin color  WNL for ethnicity.

## 2022-03-06 NOTE — ED Provider Notes (Signed)
Behavioral Health Progress Note  Date and Time: 03/06/2022 10:57 AM Name: [name] MRN:  [id number]  Subjective:   [name] is a 31 year old transgender female with a past psychiatric history of bipolar affective disorder 1, previous admission in 2020 for suicidal thoughts.  The patient presented voluntarily to the [facility] behavioral health urgent care complaining of depression and suicidal thoughts without a plan.  The patient has been recommended for inpatient placement.  Of note, the patient reports that his mother died when he was 77 years old and he attempted to commit suicide via overdose after that.  On interview and assessment this morning, the patient is cooperative and appropriate but exhibits a depressed affect.  He denies experiencing any suicidal thoughts since arriving at the urgent care.  However, the patient reports a tumultuous relationship with his romantic partner, that he is living alone, and that he is not currently connected with a psychiatrist or therapist.  He states that he has been off his medication for several months.  Continue to recommend inpatient placement.  Patient voluntarily consents to treatment.  Patient reports seeing shadows when he is alone.  He is not distressed by this.  He denies experiencing homicidal thoughts.  He reports sleeping well.  He denies experiencing any sort of withdrawal at this time.  He denies recent alcohol use.  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[place])     Past Surgical History:  Procedure Laterality Date   CHOLECYSTECTOMY N/A 08/29/2021   Procedure: LAPAROSCOPIC CHOLECYSTECTOMY WITH ICG;  Surgeon: Ileana Roup, MD;  Location: [facility] ORS;  Service: General;  Laterality: N/A;   WISDOM TOOTH EXTRACTION     Family History:  Family History  Problem Relation Age of Onset   Heart disease Mother    Heart attack Mother    Depression Father    Rheum arthritis Father    Hypertension Father    Bipolar disorder Brother    Family Psychiatric  History: as per H and P Social History:  Social History   Substance and Sexual Activity  Alcohol Use Never     Social History   Substance and Sexual Activity  Drug Use Yes   Types: Marijuana   Comment: marijuana use every day    Social History   Socioeconomic History   Marital status: Significant Other    Spouse name: Not on file   Number of children: Not on file   Years of education: Not on file   Highest education level: Not on file  Occupational History   Not on file  Tobacco Use   Smoking status: Former    Packs/day: 1.00    Years: 5.00    Total pack years: 5.00    Types: Cigarettes, E-cigarettes    Start date: 07/27/2009    Quit date: 12/2019    Years since quitting: 2.1   Smokeless  tobacco: Never  Vaping Use   Vaping Use: Every day   Substances: Nicotine, Flavoring  Substance and Sexual Activity   Alcohol use: Never   Drug use: Yes    Types: Marijuana    Comment: marijuana use every day   Sexual activity: Yes    Birth control/protection: None  Other Topics Concern   Not on file  Social History Narrative   Not on file   Social Determinants of Health   Financial Resource Strain: Not on file  Food Insecurity: Not on file  Transportation Needs: Not on file  Physical Activity: Not on file  Stress: Not on file  Social Connections: Not on file   SDOH:  SDOH Screenings   Alcohol Screen: Medium  Risk (05/02/2019)   Alcohol Screen    Last Alcohol Screening Score (AUDIT): 30  Depression (PHQ2-9): Medium Risk (02/15/2022)   Depression (PHQ2-9)    PHQ-2 Score: 18  Financial Resource Strain: Not on file  Food Insecurity: Not on file  Housing: Not on file  Physical Activity: Not on file  Social Connections: Not on file  Stress: Not on file  Tobacco Use: Medium Risk (02/15/2022)   Patient History    Smoking Tobacco Use: Former    Smokeless Tobacco Use: Never    Passive Exposure: Not on file  Transportation Needs: Not on file   Additional Social History:                         Sleep: Good  Appetite:  Fair  Current Medications:  Current Facility-Administered Medications  Medication Dose Route Frequency Provider Last Rate Last Admin   acetaminophen (TYLENOL) tablet 650 mg  650 mg Oral Q6H PRN Ajibola, Ene A, NP       alum & mag hydroxide-simeth (MAALOX/MYLANTA) 200-200-20 MG/5ML suspension 30 mL  30 mL Oral Q4H PRN Ajibola, Ene A, NP       ARIPiprazole (ABILIFY) tablet 10 mg  10 mg Oral Daily Ajibola, Ene A, NP   10 mg at 03/06/22 0905   hydrOXYzine (ATARAX) tablet 25 mg  25 mg Oral TID PRN Ajibola, Ene A, NP       ibuprofen (ADVIL) tablet 800 mg  800 mg Oral TID PRN Ajibola, Ene A, NP   800 mg at 03/06/22 0855   levothyroxine (SYNTHROID) tablet 25 mcg  25 mcg Oral Daily Ajibola, Ene A, NP   25 mcg at 03/06/22 1505   magnesium hydroxide (MILK OF MAGNESIA) suspension 30 mL  30 mL Oral Daily PRN Ajibola, Ene A, NP       nicotine (NICODERM CQ - dosed in mg/24 hours) patch 21 mg  21 mg Transdermal Daily Corky Sox, MD   21 mg at 03/06/22 0911   pantoprazole (PROTONIX) EC tablet 40 mg  40 mg Oral Daily Ajibola, Ene A, NP   40 mg at 03/06/22 0905   rosuvastatin (CRESTOR) tablet 5 mg  5 mg Oral Daily Ajibola, Ene A, NP   5 mg at 03/06/22 0905   traZODone (DESYREL) tablet 50 mg  50 mg Oral QHS PRN Ajibola, Ene A, NP       Vitamin D (Ergocalciferol) (DRISDOL) 1.25 MG (50000  UNIT) capsule 50,000 Units  50,000 Units Oral Q7 days Ajibola, Ene A, NP   50,000 Units at 03/06/22 0905   Current Outpatient Medications  Medication Sig Dispense Refill   ALPRAZolam (XANAX) 0.25 MG tablet Take 0.25 mg by mouth daily as needed for anxiety.  ARIPiprazole (ABILIFY) 10 MG tablet Take 1 tablet (10 mg total) by mouth daily. 90 tablet 0   cyclobenzaprine (FLEXERIL) 10 MG tablet Take 1 tablet (10 mg total) by mouth 2 (two) times daily as needed for muscle spasms. 20 tablet 0   diphenhydrAMINE (BENADRYL) 25 MG tablet Take 25 mg by mouth daily as needed for allergies.     ibuprofen (ADVIL) 200 MG tablet Take 800 mg by mouth every 6 (six) hours as needed (For leg pain).     levothyroxine (SYNTHROID) 25 MCG tablet Take 1 tablet (25 mcg total) by mouth daily. 90 tablet 1   Menthol-Methyl Salicylate (MUSCLE RUB) 10-15 % CREA Apply 1 Application topically as needed (For leg pain).     omeprazole (PRILOSEC) 40 MG capsule Take 1 capsule (40 mg total) by mouth daily. 90 capsule 2   ondansetron (ZOFRAN) 8 MG tablet Take 8 mg by mouth every 8 (eight) hours as needed for nausea or vomiting.     orlistat (ALLI) 60 MG capsule Take 1 capsule (60 mg total) by mouth 3 (three) times daily with meals. 90 capsule 2   promethazine (PHENERGAN) 25 MG tablet Take 25 mg by mouth every 6 (six) hours as needed for nausea or vomiting.     rosuvastatin (CRESTOR) 5 MG tablet Take 1 tablet (5 mg total) by mouth daily. (Patient taking differently: Take 5 mg by mouth at bedtime.) 30 tablet 1   testosterone cypionate (DEPOTESTOSTERONE CYPIONATE) 200 MG/ML injection Inject 40 mg into the muscle every Wednesday.     traMADol (ULTRAM) 50 MG tablet Take 1 tablet (50 mg total) by mouth 3 (three) times daily as needed. (Patient taking differently: Take 50 mg by mouth 3 (three) times daily as needed (For pain).) 30 tablet 2   Vitamin D, Ergocalciferol, (DRISDOL) 1.25 MG (50000 UNIT) CAPS capsule Take 1 capsule (50,000 Units  total) by mouth every 7 (seven) days. (Patient taking differently: Take 50,000 Units by mouth every Friday.) 5 capsule 3    Labs  Lab Results:  Admission on 03/05/2022  Component Date Value Ref Range Status   WBC 03/06/2022 11.1 (H)  4.0 - 10.5 K/uL Final   RBC 03/06/2022 4.34  3.87 - 5.11 MIL/uL Final   Hemoglobin 03/06/2022 12.6  12.0 - 15.0 g/dL Final   HCT 03/06/2022 37.7  36.0 - 46.0 % Final   MCV 03/06/2022 86.9  80.0 - 100.0 fL Final   MCH 03/06/2022 29.0  26.0 - 34.0 pg Final   MCHC 03/06/2022 33.4  30.0 - 36.0 g/dL Final   RDW 03/06/2022 14.1  11.5 - 15.5 % Final   Platelets 03/06/2022 281  150 - 400 K/uL Final   nRBC 03/06/2022 0.0  0.0 - 0.2 % Final   Neutrophils Relative % 03/06/2022 61  % Final   Neutro Abs 03/06/2022 6.8  1.7 - 7.7 K/uL Final   Lymphocytes Relative 03/06/2022 27  % Final   Lymphs Abs 03/06/2022 3.0  0.7 - 4.0 K/uL Final   Monocytes Relative 03/06/2022 7  % Final   Monocytes Absolute 03/06/2022 0.8  0.1 - 1.0 K/uL Final   Eosinophils Relative 03/06/2022 4  % Final   Eosinophils Absolute 03/06/2022 0.4  0.0 - 0.5 K/uL Final   Basophils Relative 03/06/2022 1  % Final   Basophils Absolute 03/06/2022 0.1  0.0 - 0.1 K/uL Final   Immature Granulocytes 03/06/2022 0  % Final   Abs Immature Granulocytes 03/06/2022 0.04  0.00 - 0.07 K/uL Final  Performed at [facility], [place] [address], [place], [place] [address]   Sodium 03/06/2022 138  135 - 145 mmol/L Final   Potassium 03/06/2022 3.9  3.5 - 5.1 mmol/L Final   Chloride 03/06/2022 109  98 - 111 mmol/L Final   CO2 03/06/2022 22  22 - 32 mmol/L Final   Glucose, Bld 03/06/2022 101 (H)  70 - 99 mg/dL Final   Glucose reference range applies only to samples taken after fasting for at least 8 hours.   BUN 03/06/2022 8  6 - 20 mg/dL Final   Creatinine, Ser 03/06/2022 0.90  0.44 - 1.00 mg/dL Final   Calcium 03/06/2022 9.1  8.9 - 10.3 mg/dL Final   Total Protein 03/06/2022 6.4 (L)  6.5 - 8.1 g/dL Final    Albumin 03/06/2022 3.6  3.5 - 5.0 g/dL Final   AST 03/06/2022 19  15 - 41 U/L Final   ALT 03/06/2022 34  0 - 44 U/L Final   Alkaline Phosphatase 03/06/2022 62  38 - 126 U/L Final   Total Bilirubin 03/06/2022 0.7  0.3 - 1.2 mg/dL Final   GFR, Estimated 03/06/2022 >60  >60 mL/min Final   Comment: (NOTE) Calculated using the CKD-EPI Creatinine Equation (2021)    Anion gap 03/06/2022 7  5 - 15 Final   Performed at [facility] [address] [place], [place] [address]   Hgb A1c MFr Bld 03/06/2022 4.9  4.8 - 5.6 % Final   Comment: (NOTE) Pre diabetes:          5.7%-6.4%  Diabetes:              >6.4%  Glycemic control for   <7.0% adults with diabetes    Mean Plasma Glucose 03/06/2022 93.93  mg/dL Final   Performed at [facility], [place] [address] [place], [place] [address]   Alcohol, Ethyl (B) 03/06/2022 <10  <10 mg/dL Final   Comment: (NOTE) Lowest detectable limit for serum alcohol is 10 mg/dL.  For medical purposes only. Performed at [facility], [place] [address] [place], [place] [address]    Cholesterol 03/06/2022 185  0 - 200 mg/dL Final   Triglycerides 03/06/2022 102  <150 mg/dL Final   HDL 03/06/2022 36 (L)  >40 mg/dL Final   Total CHOL/HDL Ratio 03/06/2022 5.1  RATIO Final   VLDL 03/06/2022 20  0 - 40 mg/dL Final   LDL Cholesterol 03/06/2022 129 (H)  0 - 99 mg/dL Final   Comment:        Total Cholesterol/HDL:CHD Risk Coronary Heart Disease Risk Table                     Men   Women  1/2 Average Risk   3.4   3.3  Average Risk       5.0   4.4  2 X Average Risk   9.6   7.1  3 X Average Risk  23.4   11.0        Use the calculated Patient Ratio above and the CHD Risk Table to determine the patient's CHD Risk.        ATP III CLASSIFICATION (LDL):  <100     mg/dL   Optimal  100-129  mg/dL   Near or Above                    Optimal  130-159  mg/dL   Borderline  160-189  mg/dL  High  >190     mg/dL   Very High Performed at [facility] [address], [place], [place] [address]    TSH 03/06/2022 4.044  0.350 - 4.500 uIU/mL Final   Comment: Performed by a 3rd Generation assay with a functional sensitivity of <=0.01 uIU/mL. Performed at [facility], [place] [address] [place], [place] [address]   Preadmission on 03/05/2022  Component Date Value Ref Range Status   SARS Coronavirus 2 by RT PCR 03/05/2022 NEGATIVE  NEGATIVE Final   Comment: (NOTE) SARS-CoV-2 target nucleic acids are NOT DETECTED.  The SARS-CoV-2 RNA is generally detectable in upper and lower respiratory specimens during the acute phase of infection. The lowest concentration of SARS-CoV-2 viral copies this assay can detect is 250 copies / mL. A negative result does not preclude SARS-CoV-2 infection and should not be used as the sole basis for treatment or other patient management decisions.  A negative result may occur with improper specimen collection / handling, submission of specimen other than nasopharyngeal swab, presence of viral mutation(s) within the areas targeted by this assay, and inadequate number of viral copies (<250 copies / mL). A negative result must be combined with clinical observations, patient history, and epidemiological information.  Fact Sheet for Patients:   https://www.patel.info/  Fact Sheet for Healthcare Providers: https://hall.com/  This test is not yet approved or                           cleared by the [place] FDA and has been authorized for detection and/or diagnosis of SARS-CoV-2 by FDA under an Emergency Use Authorization (EUA).  This EUA will remain in effect (meaning this test can be used) for the duration of the COVID-19 declaration under Section 564(b)(1) of the Act, 21 U.S.C. section 360bbb-3(b)(1), unless the authorization is terminated or revoked sooner.  Performed at [facility], [place] [address] [place],  [address]   Office Visit  on 01/04/2022  Component Date Value Ref Range Status   Glucose 01/04/2022 106 (H)  70 - 99 mg/dL Final   BUN 01/04/2022 9  6 - 20 mg/dL Final   Creatinine, Ser 01/04/2022 0.91  0.57 - 1.00 mg/dL Final   eGFR 01/04/2022 86  >59 mL/min/1.73 Final   BUN/Creatinine Ratio 01/04/2022 10  9 - 23 Final   Sodium 01/04/2022 142  134 - 144 mmol/L Final   Potassium 01/04/2022 4.6  3.5 - 5.2 mmol/L Final   Chloride 01/04/2022 105  96 - 106 mmol/L Final   CO2 01/04/2022 21  20 - 29 mmol/L Final   Calcium 01/04/2022 9.4  8.7 - 10.2 mg/dL Final   Total Protein 01/04/2022 7.6  6.0 - 8.5 g/dL Final   Albumin 01/04/2022 4.5  3.8 - 4.8 g/dL Final   Globulin, Total 01/04/2022 3.1  1.5 - 4.5 g/dL Final   Albumin/Globulin Ratio 01/04/2022 1.5  1.2 - 2.2 Final   Bilirubin Total 01/04/2022 0.2  0.0 - 1.2 mg/dL Final   Alkaline Phosphatase 01/04/2022 107  44 - 121 IU/L Final   AST 01/04/2022 27  0 - 40 IU/L Final   ALT 01/04/2022 48 (H)  0 - 32 IU/L Final   WBC 01/04/2022 11.4 (H)  3.4 - 10.8 x10E3/uL Final   RBC 01/04/2022 4.76  3.77 - 5.28 x10E6/uL Final   Hemoglobin 01/04/2022 13.7  11.1 - 15.9 g/dL Final   Hematocrit 01/04/2022 42.0  34.0 -  46.6 % Final   MCV 01/04/2022 88  79 - 97 fL Final   MCH 01/04/2022 28.8  26.6 - 33.0 pg Final   MCHC 01/04/2022 32.6  31.5 - 35.7 g/dL Final   RDW 01/04/2022 14.3  11.7 - 15.4 % Final   Platelets 01/04/2022 316  150 - 450 x10E3/uL Final   Neutrophils 01/04/2022 71  Not Estab. % Final   Lymphs 01/04/2022 21  Not Estab. % Final   Monocytes 01/04/2022 5  Not Estab. % Final   Eos 01/04/2022 3  Not Estab. % Final   [place]os 01/04/2022 0  Not Estab. % Final   Neutrophils Absolute 01/04/2022 8.0 (H)  1.4 - 7.0 x10E3/uL Final   Lymphocytes Absolute 01/04/2022 2.4  0.7 - 3.1 x10E3/uL Final   Monocytes Absolute 01/04/2022 0.6  0.1 - 0.9 x10E3/uL Final   EOS (ABSOLUTE) 01/04/2022 0.4  0.0 - 0.4 x10E3/uL Final   Basophils Absolute 01/04/2022 0.1  0.0 - 0.2 x10E3/uL Final    Immature Granulocytes 01/04/2022 0  Not Estab. % Final   Immature Grans (Abs) 01/04/2022 0.0  0.0 - 0.1 x10E3/uL Final   Cholesterol, Total 01/04/2022 153  100 - 199 mg/dL Final   Triglycerides 01/04/2022 106  0 - 149 mg/dL Final   HDL 01/04/2022 47  >39 mg/dL Final   VLDL Cholesterol Cal 01/04/2022 19  5 - 40 mg/dL Final   LDL Chol Calc (NIH) 01/04/2022 87  0 - 99 mg/dL Final   Chol/HDL Ratio 01/04/2022 3.3  0.0 - 4.4 ratio Final   Comment:                                   T. Chol/HDL Ratio                                             Men  Women                               1/2 Avg.Risk  3.4    3.3                                   Avg.Risk  5.0    4.4                                2X Avg.Risk  9.6    7.1                                3X Avg.Risk 23.4   11.0    Vit D, 25-Hydroxy 01/04/2022 10.0 (L)  30.0 - 100.0 ng/mL Final   Comment: Vitamin D deficiency has been defined by the [place] practice guideline as a level of serum 25-OH vitamin D less than 20 ng/mL (1,2). The Endocrine Society went on to further define vitamin D insufficiency as a level between 21 and 29 ng/mL (2). 1. IOM (Institute of Medicine). 2010. Dietary reference    intakes for calcium and D. Combine: The    [employer].  2. Holick MF, [place], Bischoff-Ferrari HA, et al.    Evaluation, treatment, and prevention of vitamin D    deficiency: an Endocrine Society clinical practice    guideline. JCEM. 2011 Jul; 96(7):1911-30.    TSH 01/04/2022 7.750 (H)  0.450 - 4.500 uIU/mL Final   T4, Total 01/04/2022 8.8  4.5 - 12.0 ug/dL Final   T3 Uptake Ratio 01/04/2022 20 (L)  24 - 39 % Final   Free Thyroxine Index 01/04/2022 1.8  1.2 - 4.9 Final   Lipase 01/04/2022 24  14 - 72 U/L Final   Amylase 01/04/2022 32  31 - 110 U/L Final   Magnesium 01/04/2022 2.2  1.6 - 2.3 mg/dL Final   HB A1C (BAYER DCA - WAIVED) 01/04/2022 4.9  4.8 - 5.6 % Final   Comment:           Prediabetes: 5.7 - 6.4          Diabetes: >6.4          Glycemic control for adults with diabetes: <7.0   Admission on 12/25/2021, Discharged on 12/27/2021  Component Date Value Ref Range Status   Weight 12/26/2021 4,289.6  oz Final   Height 12/26/2021 63  in Final   BP 12/26/2021 114/67  mmHg Final   S' Lateral 12/26/2021 3.20  cm Final   Area-P 1/2 12/26/2021 2.97  cm2 Final   Ferritin 12/25/2021 42  11 - 307 ng/mL Final   Performed at [facility], [place] [address] [place], [place] [address]   CRP 12/25/2021 1.0 (H)  <1.0 mg/dL Final   Performed at [facility] [address], [place], [place] [address]   Sed Rate 12/25/2021 26 (H)  0 - 22 mm/hr Final   Performed at [facility] [address], [place], [place] [address]   HIV Screen 4th Generation wRfx 12/25/2021 Non Reactive  Non Reactive Final   Performed at [facility], [place] [address] [place], [place] [address]   TSH 12/25/2021 0.797  0.350 - 4.500 uIU/mL Final   Comment: Performed by a 3rd Generation assay with a functional sensitivity of <=0.01 uIU/mL. Performed at [facility], [place] [address] [place], [place] [address]    Sodium 12/26/2021 136  135 - 145 mmol/L Final   Potassium 12/26/2021 3.9  3.5 - 5.1 mmol/L Final   Chloride 12/26/2021 111  98 - 111 mmol/L Final   CO2 12/26/2021 20 (L)  22 - 32 mmol/L Final   Glucose, Bld 12/26/2021 115 (H)  70 - 99 mg/dL Final   Glucose reference range applies only to samples taken after fasting for at least 8 hours.   BUN 12/26/2021 9  6 - 20 mg/dL Final   Creatinine, Ser 12/26/2021 0.77  0.44 - 1.00 mg/dL Final   Calcium 12/26/2021 8.8 (L)  8.9 - 10.3 mg/dL Final   GFR, Estimated 12/26/2021 >60  >60 mL/min Final   Comment: (NOTE) Calculated using the CKD-EPI Creatinine Equation (2021)    Anion gap 12/26/2021 5  5 - 15 Final   Performed at [facility] [address], [place], [place] [address]   WBC 12/26/2021 11.9 (H)  4.0 - 10.5 K/uL  Final   RBC 12/26/2021 4.06  3.87 - 5.11 MIL/uL Final   Hemoglobin 12/26/2021 11.7 (L)  12.0 - 15.0 g/dL Final   HCT 12/26/2021 36.1  36.0 - 46.0 % Final   MCV 12/26/2021 88.9  80.0 - 100.0  fL Final   MCH 12/26/2021 28.8  26.0 - 34.0 pg Final   MCHC 12/26/2021 32.4  30.0 - 36.0 g/dL Final   RDW 12/26/2021 14.2  11.5 - 15.5 % Final   Platelets 12/26/2021 287  150 - 400 K/uL Final   nRBC 12/26/2021 0.0  0.0 - 0.2 % Final   Performed at [facility], [place] [address], [place], [place] [address]   Lipase 12/26/2021 26  11 - 51 U/L Final   Performed at [facility] [address] [place], [place] [address]   Hepatitis B Surface Ag 12/26/2021 NON REACTIVE  NON REACTIVE Final   HCV Ab 12/26/2021 NON REACTIVE  NON REACTIVE Final   Comment: (NOTE) Nonreactive HCV antibody screen is consistent with no HCV infections,  unless recent infection is suspected or other evidence exists to indicate HCV infection.     Hep A IgM 12/26/2021 NON REACTIVE  NON REACTIVE Final   Hep B C IgM 12/26/2021 NON REACTIVE  NON REACTIVE Final   Performed at [facility], [place] [address] [place],  [place] [address]   Cholesterol 12/26/2021 188  0 - 200 mg/dL Final   Triglycerides 12/26/2021 78  <150 mg/dL Final   HDL 12/26/2021 39 (L)  >40 mg/dL Final   Total CHOL/HDL Ratio 12/26/2021 4.8  RATIO Final   VLDL 12/26/2021 16  0 - 40 mg/dL Final   LDL Cholesterol 12/26/2021 133 (H)  0 - 99 mg/dL Final   Comment:        Total Cholesterol/HDL:CHD Risk Coronary Heart Disease Risk Table                     Men   Women  1/2 Average Risk   3.4   3.3  Average Risk       5.0   4.4  2 X Average Risk   9.6   7.1  3 X Average Risk  23.4   11.0        Use the calculated Patient Ratio above and the CHD Risk Table to determine the patient's CHD Risk.        ATP III CLASSIFICATION (LDL):  <100     mg/dL   Optimal  100-129  mg/dL   Near or Above                    Optimal  130-159  mg/dL   Borderline   160-189  mg/dL   High  >190     mg/dL   Very High Performed at [facility] [address] [place], [place] [address]    WBC 12/27/2021 8.2  4.0 - 10.5 K/uL Final   RBC 12/27/2021 4.09  3.87 - 5.11 MIL/uL Final   Hemoglobin 12/27/2021 11.8 (L)  12.0 - 15.0 g/dL Final   HCT 12/27/2021 36.8  36.0 - 46.0 % Final   MCV 12/27/2021 90.0  80.0 - 100.0 fL Final   MCH 12/27/2021 28.9  26.0 - 34.0 pg Final   MCHC 12/27/2021 32.1  30.0 - 36.0 g/dL Final   RDW 12/27/2021 14.6  11.5 - 15.5 % Final   Platelets 12/27/2021 275  150 - 400 K/uL Final   nRBC 12/27/2021 0.0  0.0 - 0.2 % Final   Performed at [facility] [address], [place], [place] [address]   Sodium 12/27/2021 138  135 - 145 mmol/L Final   Potassium 12/27/2021 3.4 (L)  3.5 - 5.1 mmol/L Final   Chloride 12/27/2021 111  98 - 111 mmol/L Final   CO2 12/27/2021 23  22 - 32 mmol/L Final   Glucose, Bld 12/27/2021 89  70 - 99 mg/dL Final   Glucose reference range applies only to samples taken after fasting for at least 8 hours.   BUN 12/27/2021 10  6 - 20 mg/dL Final   Creatinine, Ser 12/27/2021 0.92  0.44 - 1.00 mg/dL Final   Calcium 12/27/2021 8.5 (L)  8.9 - 10.3 mg/dL Final   GFR, Estimated 12/27/2021 >60  >60 mL/min Final   Comment: (NOTE) Calculated using the CKD-EPI Creatinine Equation (2021)    Anion gap 12/27/2021 4 (L)  5 - 15 Final   Performed at [facility] [address] [place], [place] [address]   Total Protein 12/27/2021 6.2 (L)  6.5 - 8.1 g/dL Final   Albumin 12/27/2021 3.2 (L)  3.5 - 5.0 g/dL Final   AST 12/27/2021 21  15 - 41 U/L Final   ALT 12/27/2021 61 (H)  0 - 44 U/L Final   Alkaline Phosphatase 12/27/2021 65  38 - 126 U/L Final   Total Bilirubin 12/27/2021 0.1 (L)  0.3 - 1.2 mg/dL Final   Bilirubin, Direct 12/27/2021 <0.1  0.0 - 0.2 mg/dL Final   Indirect Bilirubin 12/27/2021 NOT CALCULATED  0.3 - 0.9 mg/dL Final   Performed at [facility] [address] [place], [place]  [address]  Admission on 12/09/2021, Discharged on 12/10/2021  Component Date Value Ref Range Status   Color, Urine 12/09/2021 AMBER (A)  YELLOW Final   BIOCHEMICALS MAY BE AFFECTED BY COLOR   APPearance 12/09/2021 TURBID (A)  CLEAR Final   Specific Gravity, Urine 12/09/2021 1.021  1.005 - 1.030 Final   pH 12/09/2021 5.0  5.0 - 8.0 Final   Glucose, UA 12/09/2021 NEGATIVE  NEGATIVE mg/dL Final   Hgb urine dipstick 12/09/2021 LARGE (A)  NEGATIVE Final   Bilirubin Urine 12/09/2021 NEGATIVE  NEGATIVE Final   Ketones, ur 12/09/2021 5 (A)  NEGATIVE mg/dL Final   Protein, ur 12/09/2021 100 (A)  NEGATIVE mg/dL Final   Nitrite 12/09/2021 POSITIVE (A)  NEGATIVE Final   Leukocytes,Ua 12/09/2021 LARGE (A)  NEGATIVE Final   RBC / HPF 12/09/2021 >50 (H)  0 - 5 RBC/hpf Final   WBC, UA 12/09/2021 >50 (H)  0 - 5 WBC/hpf Final   Bacteria, UA 12/09/2021 MANY (A)  NONE SEEN Final   Squamous Epithelial / LPF 12/09/2021 0-5  0 - 5 Final   WBC Clumps 12/09/2021 PRESENT   Final   Mucus 12/09/2021 PRESENT   Final   Performed at [facility] [facility], [address] [place], [place] [address]   WBC 12/09/2021 11.5 (H)  4.0 - 10.5 K/uL Final   RBC 12/09/2021 4.87  3.87 - 5.11 MIL/uL Final   Hemoglobin 12/09/2021 14.1  12.0 - 15.0 g/dL Final   HCT 12/09/2021 43.4  36.0 - 46.0 % Final   MCV 12/09/2021 89.1  80.0 - 100.0 fL Final   MCH 12/09/2021 29.0  26.0 - 34.0 pg Final   MCHC 12/09/2021 32.5  30.0 - 36.0 g/dL Final   RDW 12/09/2021 14.6  11.5 - 15.5 % Final   Platelets 12/09/2021 304  150 - 400 K/uL Final   nRBC 12/09/2021 0.0  0.0 - 0.2 % Final   Neutrophils Relative % 12/09/2021 64  % Final   Neutro Abs 12/09/2021 7.4  1.7 - 7.7 K/uL  Final   Lymphocytes Relative 12/09/2021 27  % Final   Lymphs Abs 12/09/2021 3.1  0.7 - 4.0 K/uL Final   Monocytes Relative 12/09/2021 5  % Final   Monocytes Absolute 12/09/2021 0.6  0.1 - 1.0 K/uL Final   Eosinophils Relative 12/09/2021 3  % Final   Eosinophils Absolute 12/09/2021 0.3   0.0 - 0.5 K/uL Final   Basophils Relative 12/09/2021 1  % Final   Basophils Absolute 12/09/2021 0.1  0.0 - 0.1 K/uL Final   Immature Granulocytes 12/09/2021 0  % Final   Abs Immature Granulocytes 12/09/2021 0.03  0.00 - 0.07 K/uL Final   Performed at [facility], [address] [place], [place] [address]   Sodium 12/09/2021 136  135 - 145 mmol/L Final   Potassium 12/09/2021 3.7  3.5 - 5.1 mmol/L Final   Chloride 12/09/2021 105  98 - 111 mmol/L Final   CO2 12/09/2021 24  22 - 32 mmol/L Final   Glucose, Bld 12/09/2021 107 (H)  70 - 99 mg/dL Final   Glucose reference range applies only to samples taken after fasting for at least 8 hours.   BUN 12/09/2021 10  6 - 20 mg/dL Final   Creatinine, Ser 12/09/2021 1.00  0.44 - 1.00 mg/dL Final   Calcium 12/09/2021 9.2  8.9 - 10.3 mg/dL Final   GFR, Estimated 12/09/2021 >60  >60 mL/min Final   Comment: (NOTE) Calculated using the CKD-EPI Creatinine Equation (2021)    Anion gap 12/09/2021 7  5 - 15 Final   Performed at [facility], [address] [place], [place] [address]   Specimen Description 12/10/2021 RIGHT ANTECUBITAL   Final   Special Requests 12/10/2021 BOTTLES DRAWN AEROBIC AND ANAEROBIC Blood Culture adequate volume   Final   Culture 12/10/2021    Final                   Value:NO GROWTH 5 DAYS Performed at [facility], [address], [place], [place] [address]    Report Status 12/10/2021 12/15/2021 FINAL   Final   Specimen Description 12/10/2021 LEFT ANTECUBITAL   Final   Special Requests 12/10/2021 BOTTLES DRAWN AEROBIC AND ANAEROBIC Blood Culture adequate volume   Final   Culture 12/10/2021    Final                   Value:NO GROWTH 5 DAYS Performed at [facility], [address] [place],  [address]    Report Status 12/10/2021 12/15/2021 FINAL   Final  Admission on 09/25/2021, Discharged on 09/25/2021  Component Date Value Ref Range Status   WBC 09/25/2021 9.8  4.0 - 10.5 K/uL Final   RBC 09/25/2021 4.85  3.87 - 5.11  MIL/uL Final   Hemoglobin 09/25/2021 14.1  12.0 - 15.0 g/dL Final   HCT 09/25/2021 43.4  36.0 - 46.0 % Final   MCV 09/25/2021 89.5  80.0 - 100.0 fL Final   MCH 09/25/2021 29.1  26.0 - 34.0 pg Final   MCHC 09/25/2021 32.5  30.0 - 36.0 g/dL Final   RDW 09/25/2021 14.6  11.5 - 15.5 % Final   Platelets 09/25/2021 258  150 - 400 K/uL Final   nRBC 09/25/2021 0.0  0.0 - 0.2 % Final   Neutrophils Relative % 09/25/2021 65  % Final   Neutro Abs 09/25/2021 6.5  1.7 - 7.7 K/uL Final   Lymphocytes Relative 09/25/2021 25  % Final   Lymphs Abs 09/25/2021 2.5  0.7 - 4.0 K/uL  Final   Monocytes Relative 09/25/2021 5  % Final   Monocytes Absolute 09/25/2021 0.4  0.1 - 1.0 K/uL Final   Eosinophils Relative 09/25/2021 4  % Final   Eosinophils Absolute 09/25/2021 0.4  0.0 - 0.5 K/uL Final   Basophils Relative 09/25/2021 1  % Final   Basophils Absolute 09/25/2021 0.1  0.0 - 0.1 K/uL Final   Immature Granulocytes 09/25/2021 0  % Final   Abs Immature Granulocytes 09/25/2021 0.02  0.00 - 0.07 K/uL Final   Performed at [facility], [address], [place], [place] [address]   Sodium 09/25/2021 136  135 - 145 mmol/L Final   Potassium 09/25/2021 4.2  3.5 - 5.1 mmol/L Final   Chloride 09/25/2021 106  98 - 111 mmol/L Final   CO2 09/25/2021 23  22 - 32 mmol/L Final   Glucose, Bld 09/25/2021 95  70 - 99 mg/dL Final   Glucose reference range applies only to samples taken after fasting for at least 8 hours.   BUN 09/25/2021 11  6 - 20 mg/dL Final   Creatinine, Ser 09/25/2021 0.87  0.44 - 1.00 mg/dL Final   Calcium 09/25/2021 9.1  8.9 - 10.3 mg/dL Final   Total Protein 09/25/2021 7.7  6.5 - 8.1 g/dL Final   Albumin 09/25/2021 3.9  3.5 - 5.0 g/dL Final   AST 09/25/2021 23  15 - 41 U/L Final   ALT 09/25/2021 36  0 - 44 U/L Final   Alkaline Phosphatase 09/25/2021 64  38 - 126 U/L Final   Total Bilirubin 09/25/2021 0.7  0.3 - 1.2 mg/dL Final   GFR, Estimated 09/25/2021 >60  >60 mL/min Final   Comment:  (NOTE) Calculated using the CKD-EPI Creatinine Equation (2021)    Anion gap 09/25/2021 7  5 - 15 Final   Performed at [facility], [address], [place], [place] [address]   Lipase 09/25/2021 26  11 - 51 U/L Final   Performed at [facility], [address], [place], [place] [address]   hCG, Ernst Spell Chain, Quant, S 09/25/2021 <1  <5 mIU/mL Final   Comment:          GEST. AGE      CONC.  (mIU/mL)   <=1 WEEK        5 - 50     2 WEEKS       50 - 500     3 WEEKS       100 - 10,000     4 WEEKS     1,000 - 30,000     5 WEEKS     3,500 - 115,000   6-8 WEEKS     12,000 - 270,000    12 WEEKS     15,000 - 220,000        FEMALE AND NON-PREGNANT FEMALE:     LESS THAN 5 mIU/mL Performed at [facility], [address], [place], [place] [address]     Blood Alcohol level:  Lab Results  Component Value Date   [facility] <10 03/06/2022   ETH <10 74/06/8785    Metabolic Disorder Labs: Lab Results  Component Value Date   HGBA1C 4.9 03/06/2022   MPG 93.93 03/06/2022   MPG 99.67 05/03/2019   No results found for: "PROLACTIN" Lab Results  Component Value Date   CHOL 185 03/06/2022   TRIG 102 03/06/2022   HDL 36 (L) 03/06/2022   CHOLHDL 5.1 03/06/2022   VLDL 20 03/06/2022   LDLCALC 129 (H) 03/06/2022  [place] 87 01/04/2022    Therapeutic Lab Levels: No results found for: "LITHIUM" No results found for: "VALPROATE" No results found for: "CBMZ"  Physical Findings   AIMS    Flowsheet Row Admission (Discharged) from 05/02/2019 in [facility]  AIMS Total Score 0      AUDIT    Flowsheet Row Admission (Discharged) from 05/02/2019 in [facility]  Alcohol Use Disorder Identification Test Final Score (AUDIT) 30      GAD-7    Flowsheet Row Office Visit from 02/15/2022 in [place] Visit from 01/04/2022 in [place]  Total GAD-7 Score 11 16      PHQ2-9    [place] Office Visit from 02/15/2022 in [place] Visit from 01/04/2022 in [place]  PHQ-2 Total Score 6 6  PHQ-9 Total Score 18 22      Flowsheet Row Pre-admit from 03/05/2022 in [place] ED from 01/10/2022 in [place] Admission (Discharged) from 12/25/2021 in [place] CATEGORY High Risk No Risk No Risk        Musculoskeletal  Strength & Muscle Tone: within normal limits Gait & Station: normal Patient leans: N/A  Psychiatric Specialty Exam  Presentation  General Appearance: Casual  Eye Contact:Good  Speech:Clear and Coherent  Speech Volume:Normal  Handedness:Right   Mood and Affect  Mood:Depressed; Anxious  Affect:Congruent   Thought Process  Thought Processes:Coherent; Linear  Descriptions of Associations:Intact  Orientation:Full (Time, Place and Person)  Thought Content:Logical     Hallucinations:Hallucinations: Visual Description of Visual Hallucinations: sees shadows when alone  Ideas of Reference:None  Suicidal Thoughts: no Homicidal Thoughts:Homicidal Thoughts: No   Sensorium  Memory:Immediate Good; Recent Good; Remote Good  Judgment:Fair  Insight:Fair   Executive Functions  Concentration:Fair  Attention Span:Fair  [place]   Psychomotor Activity  Psychomotor Activity:Psychomotor Activity: Normal   Assets  Assets:Communication Skills; Desire for Improvement   Sleep  Sleep: good   Physical Exam  Physical Exam Constitutional:      Appearance: the patient is not toxic-appearing.  Pulmonary:     Effort: Pulmonary effort is normal.  Neurological:     General: No focal deficit present.     Mental Status: the patient is alert and oriented to person, place, and time.   Review of Systems  Respiratory:  Negative for shortness of breath.    Cardiovascular:  Negative for chest pain.  Gastrointestinal:  Negative for abdominal pain, constipation, diarrhea, nausea and vomiting.  Neurological:  Negative for headaches.   Blood pressure 120/80, pulse 60, temperature 98.1 F (36.7 C), temperature source Oral, resp. rate 18, SpO2 100 %. There is no height or weight on file to calculate BMI.  Treatment Plan Summary: Daily contact with patient to assess and evaluate symptoms and progress in treatment and Medication management   Status: Voluntary, continue to recommend inpatient placement  Bipolar depression -Continue Abilify 10 mg, home medication that the patient has been noncompliant with -Continue home Synthroid and other somatic medications -Hold home Xanax and Tramadol given patient's noncompliance -Patient can receive home testosterone on an outpatient basis.   Corky Sox, MD 03/06/2022 10:57 AM

## 2022-03-06 NOTE — ED Provider Notes (Cosign Needed Addendum)
FBC/OBS ASAP Discharge Summary  Date and Time: 03/06/2022 4:21 PM  Name: [name]  MRN:  [id number]   Discharge Diagnoses:  Final diagnoses:  Bipolar I disorder, most recent episode [facility])    Subjective:  [name] is a 31 year old transgender female with a past psychiatric history of bipolar affective disorder 1, previous admission in 2020 for suicidal thoughts.  The patient presented voluntarily to the [facility] behavioral health urgent care complaining of depression and suicidal thoughts without a plan.  The patient has been recommended for inpatient placement.  Of note, the patient reports that his mother died when he was 20 years old and he attempted to commit suicide via overdose after that.   On interview and assessment this morning, the patient is cooperative and appropriate but exhibits a depressed affect.  He denies experiencing any suicidal thoughts since arriving at the urgent care.  However, the patient reports a tumultuous relationship with his romantic partner, that he is living alone, and that he is not currently connected with a psychiatrist or therapist.  He states that he has been off his medication for several months.  Continue to recommend inpatient placement.  Patient voluntarily consents to treatment.   Patient reports seeing shadows when he is alone.  He is not distressed by this.  He denies experiencing homicidal thoughts.  He reports sleeping well.  He denies experiencing any sort of withdrawal at this time.  He denies recent alcohol use.  Stay Summary: patient was appropriate and cooperative  Total Time spent with patient: 15 minutes  Past Psychiatric History: as above Past Medical History:  Past Medical History:  Diagnosis Date   Anxiety attack    panic attacks   Depression    GERD (gastroesophageal reflux disease)    Headache    Myocardial infarction [facility])    age 89   Pericarditis    RA (rheumatoid arthritis) (HCC)    Still disease,  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HCC)    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HCC)    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HCC)    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HCC)     Past Surgical History:  Procedure Laterality Date   CHOLECYSTECTOMY N/A 08/29/2021   Procedure: LAPAROSCOPIC CHOLECYSTECTOMY WITH ICG;  Surgeon: Andria Meuse, MD;  Location: [facility];  Service: General;  Laterality: N/A;   WISDOM TOOTH EXTRACTION     Family History:  Family History  Problem Relation Age of Onset   Heart disease Mother    Heart attack Mother    Depression Father    Rheum arthritis Father    Hypertension Father    Bipolar disorder Brother    Family Psychiatric History: as per H and P Social History:  Social History   Substance and Sexual Activity  Alcohol Use Never     Social History   Substance and Sexual Activity  Drug Use Yes   Types: Marijuana   Comment: marijuana use every day    Social History   Socioeconomic History   Marital status: Significant Other    Spouse name: Not on file   Number of children: Not on file   Years of education: Not on file   Highest education level: Not on file  Occupational History   Not on file  Tobacco Use   Smoking status: Former    Packs/day: 1.00    Years: 5.00    Total pack years: 5.00    Types: Cigarettes, E-cigarettes    Start date: 07/27/2009    Quit date: 12/2019  Years since quitting: 2.1   Smokeless tobacco: Never  Vaping Use   Vaping Use: Every day   Substances: Nicotine, Flavoring  Substance and Sexual Activity   Alcohol use: Never   Drug use: Yes    Types: Marijuana    Comment: marijuana use every day   Sexual activity: Yes    Birth control/protection: None  Other Topics Concern   Not on file  Social History Narrative   Not on file   Social Determinants of Health   Financial Resource Strain: Not on file  Food Insecurity: Not on file  Transportation Needs: Not on file  Physical Activity: Not on file  Stress: Not on file  Social Connections: Not on file   SDOH:  SDOH  Screenings   Alcohol Screen: Medium Risk (05/02/2019)   Alcohol Screen    Last Alcohol Screening Score (AUDIT): 30  Depression (PHQ2-9): Medium Risk (02/15/2022)   Depression (PHQ2-9)    PHQ-2 Score: 18  Financial Resource Strain: Not on file  Food Insecurity: Not on file  Housing: Not on file  Physical Activity: Not on file  Social Connections: Not on file  Stress: Not on file  Tobacco Use: Medium Risk (02/15/2022)   Patient History    Smoking Tobacco Use: Former    Smokeless Tobacco Use: Never    Passive Exposure: Not on file  Transportation Needs: Not on file    Tobacco Cessation:  Prescription not provided because: transfer  Current Medications:  Current Facility-Administered Medications  Medication Dose Route Frequency Provider Last Rate Last Admin   acetaminophen (TYLENOL) tablet 650 mg  650 mg Oral Q6H PRN Ajibola, Ene A, NP   650 mg at 03/06/22 1609   alum & mag hydroxide-simeth (MAALOX/MYLANTA) 200-200-20 MG/5ML suspension 30 mL  30 mL Oral Q4H PRN Ajibola, Ene A, NP       ARIPiprazole (ABILIFY) tablet 10 mg  10 mg Oral Daily Ajibola, Ene A, NP   10 mg at 03/06/22 0905   hydrOXYzine (ATARAX) tablet 25 mg  25 mg Oral TID PRN Ajibola, Ene A, NP       ibuprofen (ADVIL) tablet 800 mg  800 mg Oral TID PRN Ajibola, Ene A, NP   800 mg at 03/06/22 0855   levothyroxine (SYNTHROID) tablet 25 mcg  25 mcg Oral Daily Ajibola, Ene A, NP   25 mcg at 03/06/22 0737   magnesium hydroxide (MILK OF MAGNESIA) suspension 30 mL  30 mL Oral Daily PRN Ajibola, Ene A, NP       nicotine (NICODERM CQ - dosed in mg/24 hours) patch 21 mg  21 mg Transdermal Daily Carlyn Reichert, MD   21 mg at 03/06/22 0911   pantoprazole (PROTONIX) EC tablet 40 mg  40 mg Oral Daily Ajibola, Ene A, NP   40 mg at 03/06/22 0905   rosuvastatin (CRESTOR) tablet 5 mg  5 mg Oral Daily Ajibola, Ene A, NP   5 mg at 03/06/22 0905   traZODone (DESYREL) tablet 50 mg  50 mg Oral QHS PRN Ajibola, Ene A, NP       Vitamin D  (Ergocalciferol) (DRISDOL) 1.25 MG (50000 UNIT) capsule 50,000 Units  50,000 Units Oral Q7 days Ajibola, Ene A, NP   50,000 Units at 03/06/22 0905   Current Outpatient Medications  Medication Sig Dispense Refill   ARIPiprazole (ABILIFY) 10 MG tablet Take 1 tablet (10 mg total) by mouth daily. 90 tablet 0   ibuprofen (ADVIL) 200 MG tablet Take 800 mg by mouth  every 6 (six) hours as needed (For leg pain).     levothyroxine (SYNTHROID) 25 MCG tablet Take 1 tablet (25 mcg total) by mouth daily. 90 tablet 1   omeprazole (PRILOSEC) 40 MG capsule Take 1 capsule (40 mg total) by mouth daily. 90 capsule 2   rosuvastatin (CRESTOR) 5 MG tablet Take 1 tablet (5 mg total) by mouth daily. (Patient taking differently: Take 5 mg by mouth at bedtime.) 30 tablet 1   testosterone cypionate (DEPOTESTOSTERONE CYPIONATE) 200 MG/ML injection Inject 40 mg into the muscle every Wednesday.     Vitamin D, Ergocalciferol, (DRISDOL) 1.25 MG (50000 UNIT) CAPS capsule Take 1 capsule (50,000 Units total) by mouth every 7 (seven) days. (Patient taking differently: Take 50,000 Units by mouth every Friday.) 5 capsule 3   [START ON 03/07/2022] nicotine (NICODERM CQ - DOSED IN MG/24 HOURS) 21 mg/24hr patch Place 1 patch (21 mg total) onto the skin daily. 28 patch 0   traZODone (DESYREL) 50 MG tablet Take 1 tablet (50 mg total) by mouth at bedtime as needed for sleep.      PTA Medications: (Not in a hospital admission)      02/15/2022    9:26 AM 01/04/2022   11:41 AM  Depression screen PHQ 2/9  Decreased Interest 3 3  Down, Depressed, Hopeless 3 3  PHQ - 2 Score 6 6  Altered sleeping 3 3  Tired, decreased energy 3 3  Change in appetite 0 3  Feeling bad or failure about yourself  1 2  Trouble concentrating 3 3  Moving slowly or fidgety/restless 2 2  Suicidal thoughts 0 0  PHQ-9 Score 18 22  Difficult doing work/chores Very difficult     Flowsheet Row [facility]  ED from 01/10/2022 in [place] EMERGENCY DEPARTMENT Admission (Discharged) from 12/25/2021 in [facility] Progressive Care  C-SSRS RISK CATEGORY High Risk No Risk No Risk       Musculoskeletal  Strength & Muscle Tone: within normal limits Gait & Station: normal Patient leans: N/A  Psychiatric Specialty Exam  Presentation  General Appearance: Casual  Eye Contact:Good  Speech:Clear and Coherent  Speech Volume:Normal  Handedness:Right   Mood and Affect  Mood:Depressed; Anxious  Affect:Congruent   Thought Process  Thought Processes:Coherent; Linear  Descriptions of Associations:Intact  Orientation:Full (Time, Place and Person)  Thought Content:Logical     Hallucinations:Hallucinations: Visual Description of Visual Hallucinations: sees shadows when alone  Ideas of Reference:None  Suicidal Thoughts: no Homicidal Thoughts:Homicidal Thoughts: No   Sensorium  Memory:Immediate Good; Recent Good; Remote Good  Judgment:Fair  Insight:Fair   Executive Functions  Concentration:Fair  Attention Span:Fair  Recall:Fair  Fund of Knowledge:Fair  Language:Fair   Psychomotor Activity  Psychomotor Activity:Psychomotor Activity: Normal   Assets  Assets:Communication Skills; Desire for Improvement   Sleep  Sleep: fair   Physical Exam  Physical Exam Constitutional:      Appearance: the patient is not toxic-appearing.  Pulmonary:     Effort: Pulmonary effort is normal.  Neurological:     General: No focal deficit present.     Mental Status: the patient is alert and oriented to person, place, and time.   Review of Systems  Respiratory:  Negative for shortness of breath.   Cardiovascular:  Negative for chest pain.  Gastrointestinal:  Negative for abdominal pain, constipation, diarrhea, nausea and vomiting.  Neurological:  Negative for headaches.   Blood pressure 120/80, pulse 60, temperature 98.1 F (36.7 C), temperature  source Oral, resp. rate 18,  SpO2 100 %. There is no height or weight on file to calculate BMI.  Demographic Factors:  transgender  Loss Factors: NA  Historical Factors: Prior suicide attempts Living alone  Risk Reduction Factors:   Positive coping skills  Continued Clinical Symptoms:  Depression:   Hopelessness  Cognitive Features That Contribute To Risk:  None    Suicide Risk:  Moderate: Patient presents with identifiable suicidal ideation.  The patient has a past history of suicide attempt.  Expect this risk assessment to improve with medication management, integration into the therapeutic milieu, and disposition planning.  Plan Of Care/Follow-up recommendations:  Per [facility]  Disposition: Transfer to [facility]  Carlyn Reichert, MD 03/06/2022, 4:21 PM

## 2022-03-06 NOTE — Progress Notes (Signed)
SBAR Report received from previous nurse. Pt received calm and visible on unit. Pt denies current SI/ HI, A/V H, depression, anxiety, and rates pain 8/10 at this time. Pt  appears otherwise stable and free of distress. Pt reminded of camera surveillance, q 15 min rounds, and rules of the milieu. Will continue to assess.     03/06/22 2235  Psych Admission Type (Psych Patients Only)  Admission Status Voluntary  Psychosocial Assessment  Patient Complaints Anxiety;Depression  Eye Contact Fair  Facial Expression Anxious  Affect Depressed  Speech Logical/coherent  Interaction Assertive  Motor Activity Slow  Appearance/Hygiene In scrubs  Behavior Characteristics Cooperative  Mood Depressed  Thought Process  Coherency WDL  Content WDL  Delusions None reported or observed  Perception Hallucinations  Hallucination Auditory  Judgment Impaired  Confusion None  Danger to Self  Current suicidal ideation? Denies  Self-Injurious Behavior No self-injurious ideation or behavior indicators observed or expressed   Agreement Not to Harm Self Yes  Description of Agreement verbal contract  Danger to Others  Danger to Others None reported or observed

## 2022-03-06 NOTE — Discharge Instructions (Addendum)
Transfer to [facility] 

## 2022-03-06 NOTE — ED Notes (Signed)
Cooperative with lab draw urine specimen requested pt states she can not void at this time fluids given.

## 2022-03-07 ENCOUNTER — Encounter (HOSPITAL_COMMUNITY): Payer: Self-pay | Admitting: Student

## 2022-03-07 ENCOUNTER — Other Ambulatory Visit: Payer: Self-pay

## 2022-03-07 DIAGNOSIS — F17290 Nicotine dependence, other tobacco product, uncomplicated: Secondary | ICD-10-CM | POA: Diagnosis not present

## 2022-03-07 DIAGNOSIS — M5431 Sciatica, right side: Secondary | ICD-10-CM | POA: Diagnosis not present

## 2022-03-07 DIAGNOSIS — M0820 Juvenile rheumatoid arthritis with systemic onset, unspecified site: Secondary | ICD-10-CM | POA: Diagnosis not present

## 2022-03-07 DIAGNOSIS — Z8261 Family history of arthritis: Secondary | ICD-10-CM | POA: Diagnosis not present

## 2022-03-07 DIAGNOSIS — Z9049 Acquired absence of other specified parts of digestive tract: Secondary | ICD-10-CM | POA: Diagnosis not present

## 2022-03-07 DIAGNOSIS — K219 Gastro-esophageal reflux disease without esophagitis: Secondary | ICD-10-CM | POA: Diagnosis not present

## 2022-03-07 DIAGNOSIS — F4010 Social phobia, unspecified: Secondary | ICD-10-CM | POA: Diagnosis not present

## 2022-03-07 DIAGNOSIS — F411 Generalized anxiety disorder: Secondary | ICD-10-CM | POA: Diagnosis not present

## 2022-03-07 DIAGNOSIS — F3132 Bipolar disorder, current episode depressed, moderate: Secondary | ICD-10-CM | POA: Diagnosis not present

## 2022-03-07 DIAGNOSIS — G4700 Insomnia, unspecified: Secondary | ICD-10-CM | POA: Diagnosis not present

## 2022-03-07 DIAGNOSIS — I252 Old myocardial infarction: Secondary | ICD-10-CM | POA: Diagnosis not present

## 2022-03-07 LAB — URINALYSIS, COMPLETE (UACMP) WITH MICROSCOPIC
Bilirubin Urine: NEGATIVE
Glucose, UA: NEGATIVE mg/dL
Ketones, ur: NEGATIVE mg/dL
Leukocytes,Ua: NEGATIVE
Nitrite: NEGATIVE
Protein, ur: NEGATIVE mg/dL
Specific Gravity, Urine: 1.012 (ref 1.005–1.030)
pH: 5 (ref 5.0–8.0)

## 2022-03-07 MED ORDER — VITAMIN D (ERGOCALCIFEROL) 1.25 MG (50000 UNIT) PO CAPS
50000.0000 [IU] | ORAL_CAPSULE | ORAL | Status: DC
  Administered 2022-03-07: 50000 [IU] via ORAL
  Filled 2022-03-07 (×2): qty 1

## 2022-03-07 NOTE — Group Note (Signed)
Recreation Therapy Group Note   Group Topic:Animal Assisted Therapy   Group Date: 03/07/2022 Start Time: 1425 End Time: 1500 Facilitators: Rudolph Dobler, Benito Mccreedy, LRT Location: [address] Dayroom  Animal-Assisted Activity (AAA) Program Checklist/Progress Notes Patient Eligibility Criteria Checklist & Daily Group note for Rec Tx Intervention   AAA/T Program Assumption of Risk Form signed by Patient/ or Parent Legal Guardian YES  Patient is free of allergies or severe asthma  YES  Patient reports no fear of animals YES  Patient reports no history of cruelty to animals YES  Patient understands their participation is voluntary YES  Patient washes hands before animal contact YES  Patient washes hands after animal contact YES  Group Description: Patients provided opportunity to interact with trained and credentialed [employer] Therapy dog and the community volunteer/dog handler. Patients practiced appropriate animal interaction and were educated on dog safety outside of the hospital in common community settings. Patients were encouraged to socialize with one another and share about their experiences with animals and pets. Patients participated with turn taking for dog interactions with structure imposed as needed based on number of participants and quality of spontaneous participation delivered.  Goal Area(s) Addresses:  Patient will demonstrate appropriate social skills during group session.  Patient will demonstrate ability to follow instructions during group session.  Patient will identify if a reduction in stress level occurs as a result of participation in animal assisted therapy session.    Education: Charity fundraiser, Health visitor, Communication & Social Skills   Affect/Mood: Congruent and Euthymic   Participation Level: Moderate   Participation Quality: Independent and Minimal Cues   Behavior: Appropriate, Attentive , and Cooperative   Modes of Intervention:  Activity, Teaching laboratory technician, and Socialization   Patient Response to Interventions:  Receptive   Education Outcome:  Acknowledges education   Clinical Observations/Individualized Feedback: Kathryn "[name]" was active with prompts supporting their participation of session activities and group discussion. Pt appropriately pet the visiting therapy dog, Brianna during AAA programming. When asked, pt expressed that they have 3 cats as pets at home. Once pt was vocal in the group discussion, additional stories were shared freely.   Benito Mccreedy Lavern Crimi, LRT, CTRS 03/07/2022 4:24 PM

## 2022-03-07 NOTE — H&P (Signed)
Psychiatric Admission Assessment Adult  Patient Identification: [name] MRN:  [id number] Date of Evaluation:  03/07/2022 Chief Complaint:  Bipolar depression (HCC) [F31.9] Principal Diagnosis: Bipolar affective disorder, currently depressed, moderate (HCC) Diagnosis:  Principal Problem:   Bipolar affective disorder, currently depressed, moderate (HCC) Active Problems:   Social phobia   Tobacco use   GAD (generalized anxiety disorder)   Marijuana use   Bipolar depression (HCC)  History of Present Illness: "[name]" is a 31 YO F to M transgender patient with a history of bipolar disorder, GAD and cannabis use who presented with worsening mood instability, irritability and feeling agitated on a voluntary basis to [facility] ER. After medical clearance he was admitted to inpatient [facility].              On interview, the patient is pleasant and cooperative. He explains that he was doing well for 3 years. When his coverage changed and no longer allowed him to continue with his care at [facility], he went off medications, thinking he was stable. He noted that since this happened in January, his mood has been in decline, with increasing irritability and agitation. He works third shift, so sometimes sleep is good and sometimes not at all. Regardless, he is always fatigued. Appetite is poor. Pain from right leg sciatica, hip and back pain is very high and he is frustrated with not being able to get relief. He has been anxious and has racing thoughts about his worries all the time. Sometimes he thinks he sees shadows out of the corners but no frank hallucinations, paranoia or delusions. He would like to get restarted on medications quickly and return to previous stable state.   Associated Signs/Symptoms: Depression Symptoms:  depressed mood, insomnia, fatigue, difficulty concentrating, anxiety, loss of energy/fatigue, disturbed sleep, Duration of Depression Symptoms: No data recorded (Hypo) Manic  Symptoms:  Irritable Mood, Anxiety Symptoms:  Excessive Worry, Psychotic Symptoms:   illusions PTSD Symptoms: NA Total Time spent with patient: 45 minutes  Past Psychiatric History: at [facility] 2020. Previously outpatient at [facility] but unable to go since January. Has been on sanax, abilify, effexor and lithium in the past.   Is the patient at risk to self? Yes.    Has the patient been a risk to self in the past 6 months? Yes.    Has the patient been a risk to self within the distant past? Yes.    Is the patient a risk to others? No.  Has the patient been a risk to others in the past 6 months? No.  Has the patient been a risk to others within the distant past? No.   [place] Scale:  Flowsheet Row [facility] 01/10/2022 in [facility] EMERGENCY DEPARTMENT  C-SSRS RISK CATEGORY No Risk High Risk No Risk        Prior Inpatient Therapy:   Prior Outpatient Therapy:    Alcohol Screening: 1. How often do you have a drink containing alcohol?: Never 2. How many drinks containing alcohol do you have on a typical day when you are drinking?: 1 or 2 3. How often do you have six or more drinks on one occasion?: Never AUDIT-C Score: 0 4. How often during the last year have you found that you were not able to stop drinking once you had started?: Never 5. How often during the last year have you failed to  do what was normally expected from you because of drinking?: Never 6. How often during the last year have you needed a first drink in the morning to get yourself going after a heavy drinking session?: Never 7. How often during the last year have you had a feeling of guilt of remorse after drinking?: Never 8. How often during the last year have you been unable to remember what happened the night before because you had been drinking?: Never 9. Have you or  someone else been injured as a result of your drinking?: No 10. Has a relative or friend or a doctor or another health worker been concerned about your drinking or suggested you cut down?: No Alcohol Use Disorder Identification Test Final Score (AUDIT): 0 Substance Abuse History in the last 12 months:  Yes.   Consequences of Substance Abuse: Medical Consequences:  delay in care from pain clinic Previous Psychotropic Medications: Yes  Psychological Evaluations: Yes  Past Medical History:  Past Medical History:  Diagnosis Date   Anxiety attack    panic attacks   Depression    GERD (gastroesophageal reflux disease)    Headache    Myocardial infarction [facility])    age 89   Pericarditis    RA (rheumatoid arthritis) (HCC)    Still disease, 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HCC)    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HCC)    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HCC)    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HCC)     Past Surgical History:  Procedure Laterality Date   CHOLECYSTECTOMY N/A 08/29/2021   Procedure: LAPAROSCOPIC CHOLECYSTECTOMY WITH ICG;  Surgeon: Andria Meuse, MD;  Location: [facility];  Service: General;  Laterality: N/A;   WISDOM TOOTH EXTRACTION     Family History:  Family History  Problem Relation Age of Onset   Heart disease Mother    Heart attack Mother    Depression Father    Rheum arthritis Father    Hypertension Father    Bipolar disorder Brother    Family Psychiatric  History: brother with substance and bipolar. Father with depression. Mother with anxiety.  Tobacco Screening:   quit cigarettes 2 years ago but vapes nicotine now Social History:  Social History   Substance and Sexual Activity  Alcohol Use Not Currently     Social History   Substance and Sexual Activity  Drug Use Not Currently   Types: Marijuana   Comment: cannabis use d/o, 2 weeks of sobriety    Additional Social History: Marital status: Long term relationship Long term relationship, how long?: 12 months What types of issues is patient dealing with in the  relationship?: States "I feel stuck" due to issues of infidelity.                         Allergies:   Allergies  Allergen Reactions   Strawberry Extract Itching and Rash   Lab Results:  Results for orders placed or performed during the hospital encounter of 03/05/22 (from the past 48 hour(s))  SARS Coronavirus 2 by RT PCR (hospital order, performed in [facility] hospital lab) *cepheid single result test* Anterior Nasal Swab     Status: None   Collection Time: 03/05/22  8:03 PM   Specimen: Anterior Nasal Swab  Result Value Ref Range   SARS Coronavirus 2 by RT PCR NEGATIVE NEGATIVE    Comment: (NOTE) SARS-CoV-2 target nucleic acids are NOT DETECTED.  The SARS-CoV-2 RNA is generally detectable in upper and lower respiratory specimens during the acute phase of  infection. The lowest concentration of SARS-CoV-2 viral copies this assay can detect is 250 copies / mL. A negative result does not preclude SARS-CoV-2 infection and should not be used as the sole basis for treatment or other patient management decisions.  A negative result may occur with improper specimen collection / handling, submission of specimen other than nasopharyngeal swab, presence of viral mutation(s) within the areas targeted by this assay, and inadequate number of viral copies (<250 copies / mL). A negative result must be combined with clinical observations, patient history, and epidemiological information.  Fact Sheet for Patients:   RoadLapTop.co.za  Fact Sheet for Healthcare Providers: http://kim-miller.com/  This test is not yet approved or  cleared by the [place] FDA and has been authorized for detection and/or diagnosis of SARS-CoV-2 by FDA under an Emergency Use Authorization (EUA).  This EUA will remain in effect (meaning this test can be used) for the duration of the COVID-19 declaration under Section 564(b)(1) of the Act, 21 U.S.C. section  360bbb-3(b)(1), unless the authorization is terminated or revoked sooner.  Performed at [facility], [address], [place], [place] [address]     Blood Alcohol level:  Lab Results  Component Value Date   [facility] <10 03/06/2022   ETH <10 05/01/2019    Metabolic Disorder Labs:  Lab Results  Component Value Date   HGBA1C 4.9 03/06/2022   MPG 93.93 03/06/2022   MPG 99.67 05/03/2019   No results found for: "PROLACTIN" Lab Results  Component Value Date   CHOL 185 03/06/2022   TRIG 102 03/06/2022   HDL 36 (L) 03/06/2022   CHOLHDL 5.1 03/06/2022   VLDL 20 03/06/2022   LDLCALC 129 (H) 03/06/2022   LDLCALC 87 01/04/2022    Current Medications: Current Facility-Administered Medications  Medication Dose Route Frequency Provider Last Rate Last Admin   acetaminophen (TYLENOL) tablet 650 mg  650 mg Oral Q6H PRN Carlyn Reichert, MD   650 mg at 03/07/22 1304   alum & mag hydroxide-simeth (MAALOX/MYLANTA) 200-200-20 MG/5ML suspension 30 mL  30 mL Oral Q4H PRN Carlyn Reichert, MD       ARIPiprazole (ABILIFY) tablet 10 mg  10 mg Oral Daily Carlyn Reichert, MD   10 mg at 03/07/22 0816   hydrOXYzine (ATARAX) tablet 25 mg  25 mg Oral TID PRN Carlyn Reichert, MD   25 mg at 03/07/22 0615   levothyroxine (SYNTHROID) tablet 25 mcg  25 mcg Oral Q0600 Carlyn Reichert, MD   25 mcg at 03/07/22 9211   magnesium hydroxide (MILK OF MAGNESIA) suspension 30 mL  30 mL Oral Daily PRN Carlyn Reichert, MD       nicotine (NICODERM CQ - dosed in mg/24 hours) patch 21 mg  21 mg Transdermal Daily Carlyn Reichert, MD   21 mg at 03/07/22 0823   pantoprazole (PROTONIX) EC tablet 40 mg  40 mg Oral Daily Carlyn Reichert, MD   40 mg at 03/07/22 0827   rosuvastatin (CRESTOR) tablet 5 mg  5 mg Oral Daily Carlyn Reichert, MD       traZODone (DESYREL) tablet 50 mg  50 mg Oral QHS PRN Carlyn Reichert, MD   50 mg at 03/06/22 2228   Vitamin D (Ergocalciferol) (DRISDOL) 1.25 MG (50000 UNIT) capsule 50,000  Units  50,000 Units Oral Q7 days Roselle Locus, MD   50,000 Units at 03/07/22 0900   PTA Medications: Medications Prior to Admission  Medication Sig Dispense Refill Last Dose   ARIPiprazole (ABILIFY) 10 MG tablet  Take 1 tablet (10 mg total) by mouth daily. 90 tablet 0    ibuprofen (ADVIL) 200 MG tablet Take 800 mg by mouth every 6 (six) hours as needed (For leg pain).      levothyroxine (SYNTHROID) 25 MCG tablet Take 1 tablet (25 mcg total) by mouth daily. 90 tablet 1    nicotine (NICODERM CQ - DOSED IN MG/24 HOURS) 21 mg/24hr patch Place 1 patch (21 mg total) onto the skin daily. 28 patch 0    omeprazole (PRILOSEC) 40 MG capsule Take 1 capsule (40 mg total) by mouth daily. 90 capsule 2    rosuvastatin (CRESTOR) 5 MG tablet Take 1 tablet (5 mg total) by mouth daily. (Patient taking differently: Take 5 mg by mouth at bedtime.) 30 tablet 1    testosterone cypionate (DEPOTESTOSTERONE CYPIONATE) 200 MG/ML injection Inject 40 mg into the muscle every Wednesday.      traZODone (DESYREL) 50 MG tablet Take 1 tablet (50 mg total) by mouth at bedtime as needed for sleep.      Vitamin D, Ergocalciferol, (DRISDOL) 1.25 MG (50000 UNIT) CAPS capsule Take 1 capsule (50,000 Units total) by mouth every 7 (seven) days. (Patient taking differently: Take 50,000 Units by mouth every Friday.) 5 capsule 3     Musculoskeletal: Strength & Muscle Tone: within normal limits Gait & Station:  antalgic Patient leans: Left            Psychiatric Specialty Exam:  Presentation  General Appearance: Appropriate for Environment  Eye Contact:Fair  Speech:Normal Rate  Speech Volume:Normal  Handedness:Right   Mood and Affect  Mood:Anxious; Depressed  Affect:Constricted   Thought Process  Thought Processes:Linear  Duration of Psychotic Symptoms: No data recorded Past Diagnosis of Schizophrenia or Psychoactive disorder: No data recorded Descriptions of Associations:Intact  Orientation:Full  (Time, Place and Person)  Thought Content:Logical  Hallucinations:Hallucinations: None Description of Visual Hallucinations: sees shadows when alone  Ideas of Reference:None  Suicidal Thoughts:Suicidal Thoughts: No  Homicidal Thoughts:Homicidal Thoughts: No   Sensorium  Memory:Immediate Fair; Recent Fair; Remote Fair  Judgment:Fair  Insight:Fair   Executive Functions  Concentration:Fair  Attention Span:Fair  Recall:Fair  Fund of Knowledge:Fair  Language:Good   Psychomotor Activity  Psychomotor Activity:Psychomotor Activity: Decreased   Assets  Assets:Communication Skills   Sleep  Sleep:Sleep: Poor    Physical Exam: Physical Exam Vitals and nursing note reviewed.  Constitutional:      Appearance: He is obese.  HENT:     Head: Normocephalic.     Nose: Nose normal.  Eyes:     Extraocular Movements: Extraocular movements intact.  Pulmonary:     Effort: Pulmonary effort is normal.  Musculoskeletal:     Cervical back: Normal range of motion.  Neurological:     General: No focal deficit present.     Mental Status: He is alert and oriented to person, place, and time.      Review of Systems  Constitutional:  Negative for chills and fever.  HENT:  Positive for sore throat. Negative for hearing loss.        Dental issues, dry mouth   Respiratory:  Negative for cough.   Cardiovascular:  Negative for chest pain and palpitations.  Gastrointestinal:  Negative for constipation, diarrhea, nausea and vomiting.  Genitourinary:  Negative for dysuria.  Musculoskeletal:  Positive for back pain and joint pain.  Skin:  Negative for rash.  Neurological:  Negative for dizziness, tremors and headaches.       Sciatica    Blood pressure Marland Kitchen)  140/80, pulse (!) 114, temperature 98.5 F (36.9 C), temperature source Oral, resp. rate (!) 24, height  (1.6 m), weight 122.5 kg, SpO2 98 %. Body mass index is 47.83 kg/m.     Treatment Plan Summary: Daily contact  with patient to assess and evaluate symptoms and progress in treatment and Medication management  Observation Level/Precautions:  15 minute checks  Laboratory:  Lipids, A1c, nutrition  Psychotherapy:    Medications:  abilify  PO daily  Consultations:    Discharge Concerns:  follow up care/coverage  Estimated LOS: 3-5d  Other:     Physician Treatment Plan for Primary Diagnosis: Bipolar affective disorder, currently depressed, moderate (HCC) Long Term Goal(s): Improvement in symptoms so as ready for discharge  Short Term Goals: Ability to identify changes in lifestyle to reduce recurrence of condition will improve, Ability to verbalize feelings will improve, Ability to disclose and discuss suicidal ideas, Ability to demonstrate self-control will improve, Ability to identify and develop effective coping behaviors will improve, Ability to maintain clinical measurements within normal limits will improve, Compliance with prescribed medications will improve, and Ability to identify triggers associated with substance abuse/mental health issues will improve  Physician Treatment Plan for Secondary Diagnosis: Principal Problem:   Bipolar affective disorder, currently depressed, moderate (HCC) Active Problems:   Social phobia   Tobacco use   GAD (generalized anxiety disorder)   Marijuana use   Bipolar depression (HCC)  Long Term Goal(s): Improvement in symptoms so as ready for discharge  Short Term Goals: Ability to identify changes in lifestyle to reduce recurrence of condition will improve, Ability to verbalize feelings will improve, Ability to disclose and discuss suicidal ideas, Ability to demonstrate self-control will improve, Ability to identify and develop effective coping behaviors will improve, Ability to maintain clinical measurements within normal limits will improve, Compliance with prescribed medications will improve, and Ability to identify triggers associated with substance  abuse/mental health issues will improve  I certify that inpatient services furnished can reasonably be expected to improve the patient's condition.    Roselle Locus, MD 8/8/20233:47 PM

## 2022-03-07 NOTE — Progress Notes (Signed)
Patient asked for lidocaine patch for her R calf pain.  Also asked about tramadol, flexeril, ibuprofen 800 mg that she takes at home.

## 2022-03-07 NOTE — BHH Suicide Risk Assessment (Signed)
[facility] Admission Suicide Risk Assessment   Nursing information obtained from:  Patient Demographic factors:  Caucasian, Gay, lesbian, or bisexual orientation Current Mental Status:  NA Loss Factors:  Decline in physical health Historical Factors:  Family history of mental illness or substance abuse, Impulsivity Risk Reduction Factors:  Positive coping skills or problem solving skills  Total Time spent with patient: 45 minutes Principal Problem: Bipolar affective disorder, currently depressed, moderate (HCC) Diagnosis:  Principal Problem:   Bipolar affective disorder, currently depressed, moderate (HCC) Active Problems:   Social phobia   Tobacco use   GAD (generalized anxiety disorder)   Marijuana use   Bipolar depression (HCC)  Subjective Data: "[name]" is a 31 YO F to M transgender patient with a history of bipolar disorder, GAD and cannabis use who presented with worsening mood instability, irritability and feeling agitated on a voluntary basis to [facility] ER. After medical clearance he was admitted to inpatient [facility].   On interview, the patient is pleasant and cooperative. He explains that he was doing well for 3 years. When his coverage changed and no longer allowed him to continue with his care at [facility], he went off medications, thinking he was stable. He noted that since this happened in January, his mood has been in decline, with increasing irritability and agitation. He works third shift, so sometimes sleep is good and sometimes not at all. Regardless, he is always fatigued. Appetite is poor. Pain from right leg sciatica, hip and back pain is very high and he is frustrated with not being able to get relief. He has been anxious and has racing thoughts about his worries all the time. Sometimes he thinks he sees shadows out of the corners but no frank hallucinations, paranoia or delusions. He would like to get restarted on medications quickly and return to previous stable state.   Continued  Clinical Symptoms:  Alcohol Use Disorder Identification Test Final Score (AUDIT): 0 The "Alcohol Use Disorders Identification Test", Guidelines for Use in Primary Care, Second Edition.  World Science writer [facility]). Score between 0-7:  no or low risk or alcohol related problems. Score between 8-15:  moderate risk of alcohol related problems. Score between 16-19:  high risk of alcohol related problems. Score 20 or above:  warrants further diagnostic evaluation for alcohol dependence and treatment.   CLINICAL FACTORS:   Severe Anxiety and/or Agitation Bipolar Disorder:   Mixed State Chronic Pain   Musculoskeletal: Strength & Muscle Tone: within normal limits Gait & Station:  antalgic Patient leans: Left  Psychiatric Specialty Exam:  Presentation  General Appearance: Appropriate for Environment  Eye Contact:Fair  Speech:Normal Rate  Speech Volume:Normal  Handedness:Right   Mood and Affect  Mood:Anxious; Depressed  Affect:Constricted   Thought Process  Thought Processes:Linear  Descriptions of Associations:Intact  Orientation:Full (Time, Place and Person)  Thought Content:Logical  History of Schizophrenia/Schizoaffective disorder:No data recorded Duration of Psychotic Symptoms:No data recorded Hallucinations:Hallucinations: None Description of Visual Hallucinations: sees shadows when alone  Ideas of Reference:None  Suicidal Thoughts:Suicidal Thoughts: No  Homicidal Thoughts:Homicidal Thoughts: No   Sensorium  Memory:Immediate Fair; Recent Fair; Remote Fair  Judgment:Fair  Insight:Fair   Executive Functions  Concentration:Fair  Attention Span:Fair  Recall:Fair  Fund of Knowledge:Fair  Language:Good   Psychomotor Activity  Psychomotor Activity:Psychomotor Activity: Decreased   Assets  Assets:Communication Skills   Sleep  Sleep:Sleep: Poor    Physical Exam: Physical Exam Vitals and nursing note reviewed.  Constitutional:       Appearance: He is obese.  HENT:     Head: Normocephalic.     Nose: Nose normal.  Eyes:     Extraocular Movements: Extraocular movements intact.  Pulmonary:     Effort: Pulmonary effort is normal.  Musculoskeletal:     Cervical back: Normal range of motion.  Neurological:     General: No focal deficit present.     Mental Status: He is alert and oriented to person, place, and time.    Review of Systems  Constitutional:  Negative for chills and fever.  HENT:  Positive for sore throat. Negative for hearing loss.        Dental issues, dry mouth   Respiratory:  Negative for cough.   Cardiovascular:  Negative for chest pain and palpitations.  Gastrointestinal:  Negative for constipation, diarrhea, nausea and vomiting.  Genitourinary:  Negative for dysuria.  Musculoskeletal:  Positive for back pain and joint pain.  Skin:  Negative for rash.  Neurological:  Negative for dizziness, tremors and headaches.       Sciatica   Blood pressure (!) 140/80, pulse (!) 114, temperature 98.5 F (36.9 C), temperature source Oral, resp. rate (!) 24, height 5\' 3"  (1.6 m), weight 122.5 kg, SpO2 98 %. Body mass index is 47.83 kg/m.   COGNITIVE FEATURES THAT CONTRIBUTE TO RISK:  None    SUICIDE RISK:   Mild:  Suicidal ideation of limited frequency, intensity, duration, and specificity.  There are no identifiable plans, no associated intent, mild dysphoria and related symptoms, good self-control (both objective and subjective assessment), few other risk factors, and identifiable protective factors, including available and accessible social support.  PLAN OF CARE:  Safety and Monitoring --  Admission to inpatient psychiatric unit for safety, stabilization and treatment -- Daily contact with patient to assess and evaluate symptoms and progress in treatment -- Patient's case to be discussed in multi-disciplinary team meeting. -- Patient will be encouraged to participate in the therapeutic group  milieu. -- Observation Level : q15 minute checks -- Vital signs:  q12 hours -- Precautions: suicide.  Plan  -Monitor Vitals. -Monitor for thoughts of harm to self or others -Monitor for psychosis, disorganization or changes to cognition -Monitor for withdrawal symptoms. -Monitor for medication side effects.  Labs/Studies: Lipids, A1c, nutrition  Medications: Restart abilify 10mg  PO daily, trazodone 50mg  PO QHS PRN    I certify that inpatient services furnished can reasonably be expected to improve the patient's condition.   , MD 03/07/2022, 3:34 PM

## 2022-03-07 NOTE — Group Note (Unsigned)
Date:  03/07/2022 Time:  9:17 AM  Group Topic/Focus:  Orientation:   The focus of this group is to educate the patient on the purpose and policies of crisis stabilization and provide a format to answer questions about their admission.  The group details unit policies and expectations of patients while admitted.     Participation Level:  {BHH PARTICIPATION PJKDT:26712}  Participation Quality:  {BHH PARTICIPATION QUALITY:22265}  Affect:  {BHH AFFECT:22266}  Cognitive:  {BHH COGNITIVE:22267}  Insight: {BHH Insight2:20797}  Engagement in Group:  {BHH ENGAGEMENT IN WPYKD:98338}  Modes of Intervention:  {BHH MODES OF INTERVENTION:22269}  Additional Comments:  ***  Jaquita Rector 03/07/2022, 9:17 AM

## 2022-03-07 NOTE — Progress Notes (Signed)
Adult Psychoeducational Group Note  Date:  03/07/2022 Time:  10:33 PM  Group Topic/Focus:  Wrap-Up Group:   The focus of this group is to help patients review their daily goal of treatment and discuss progress on daily workbooks.  Participation Level:  Active  Participation Quality:  Appropriate  Affect:  Appropriate  Cognitive:  Appropriate  Insight: Appropriate  Engagement in Group:  Developing/Improving  Modes of Intervention:  Discussion  Additional Comments:  Pt stated his goal for today was to focus on his treatment plan. Pt stated he accomplished his goal today. Pt stated he talked with his doctor and social worker about his care today. Pt rated his overall day a 5 out of 10. Pt stated his father today which improved his overall day. Pt stated he felt better about himself today. Pt stated he was able to attend all meals. Pt stated he took all medications provided today. Pt stated he attend all groups held today. Pt stated his appetite was pretty good today. Pt rated sleep last night was poor. Pt stated the goal tonight was to get some rest. Pt stated he had some physical pain tonight. Pt stated he had some moderate pain all over his body. Pt rated the moderated body pain a 7 on the pain level scale. Pt nurse was updated on the situation.  Pt deny visual hallucinations and auditory issues tonight. Pt denies thoughts of harming himself or others. Pt stated he would alert staff if anything changed  Felipa Furnace 03/07/2022, 10:33 PM

## 2022-03-07 NOTE — Progress Notes (Signed)
   03/07/22 1432  Psych Admission Type (Psych Patients Only)  Admission Status Voluntary  Psychosocial Assessment  Patient Complaints Anxiety;Other (Comment) ("racing thoughts")  Eye Contact Fair  Facial Expression Animated;Anxious  Affect Appropriate to circumstance  Speech Logical/coherent  Interaction Assertive  Motor Activity Slow  Appearance/Hygiene Unremarkable  Behavior Characteristics Cooperative;Calm  Mood Depressed  Thought Process  Coherency WDL  Content WDL  Delusions None reported or observed  Perception WDL (pt currently denies)  Hallucination None reported or observed  Judgment Impaired  Confusion None  Danger to Self  Current suicidal ideation? Denies  Self-Injurious Behavior No self-injurious ideation or behavior indicators observed or expressed   Agreement Not to Harm Self Yes  Description of Agreement verbal  Danger to Others  Danger to Others None reported or observed

## 2022-03-07 NOTE — Plan of Care (Signed)
  Problem: Coping: Goal: Level of anxiety will decrease Outcome: Progressing   Problem: Safety: Goal: Ability to remain free from injury will improve Outcome: Progressing   Problem: Coping: Goal: Ability to verbalize frustrations and anger appropriately will improve Outcome: Progressing   Problem: Safety: Goal: Periods of time without injury will increase Outcome: Progressing

## 2022-03-07 NOTE — [facility] Suicide Risk Assessment (Signed)
[facility] INPATIENT:  Family/Significant Other Suicide Prevention Education  Suicide Prevention Education:  Patient Refusal for Family/Significant Other Suicide Prevention Education: The patient [name] has refused to provide written consent for family/significant other to be provided Family/Significant Other Suicide Prevention Education during admission and/or prior to discharge.  Physician notified. CSW completed SPE with patient. Discussed potential triggers leading to suicidal ideation in addition to coping skills one might use in order to delay and distract self from self harming behaviors. CSW encouraged patient to utilize emergency services if they felt unable to maintain their safety. SPE flyer provided to patient at this time.   Corky Crafts 03/07/2022, 12:25 PM
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
## 2022-03-08 ENCOUNTER — Encounter (HOSPITAL_COMMUNITY): Payer: Self-pay

## 2022-03-08 DIAGNOSIS — F3132 Bipolar disorder, current episode depressed, moderate: Secondary | ICD-10-CM | POA: Diagnosis not present

## 2022-03-08 LAB — VITAMIN B12: Vitamin B-12: 361 pg/mL (ref 180–914)

## 2022-03-08 LAB — MAGNESIUM: Magnesium: 2.4 mg/dL (ref 1.7–2.4)

## 2022-03-08 MED ORDER — NAPROXEN 500 MG PO TABS
500.0000 mg | ORAL_TABLET | Freq: Two times a day (BID) | ORAL | Status: DC | PRN
  Administered 2022-03-08 – 2022-03-09 (×2): 500 mg via ORAL
  Filled 2022-03-08 (×2): qty 1

## 2022-03-08 MED ORDER — AMITRIPTYLINE HCL 25 MG PO TABS
25.0000 mg | ORAL_TABLET | Freq: Every day | ORAL | Status: DC
  Administered 2022-03-08: 25 mg via ORAL
  Filled 2022-03-08 (×2): qty 1

## 2022-03-08 NOTE — Progress Notes (Signed)
[facility] MD Progress Note  03/08/2022 4:01 PM [name]  MRN:  [id number]  History of Present Illness: "[name]" is a 31 YO F to M transgender patient with a history of bipolar disorder, GAD and cannabis use who presented with worsening mood instability, irritability and feeling agitated on a voluntary basis to [facility] ER. After medical clearance he was admitted to inpatient [facility].   24 hour EMR reviewed. Case discussed in progression rounds. Patient complained of poor sleep and vivid dreams. Right hip and leg pain, back pain continues.   Subjective:  [name] was seen this afternoon on rounds. He states that the tylenol did not help his pain at all. He is hoping to be discharged in time to make his appointment to the pain clinic for injections. He did not sleep very well overnight. He denies current hallucinations. No thoughts of harm to self or others. Discussed discharge planning and timeline.   Principal Problem: Bipolar affective disorder, currently depressed, moderate (HCC) Diagnosis: Principal Problem:   Bipolar affective disorder, currently depressed, moderate (HCC) Active Problems:   Social phobia   Tobacco use   GAD (generalized anxiety disorder)   Marijuana use   Bipolar depression (HCC)  Total Time spent with patient: 20 minutes  Past Psychiatric History: at [facility] 2020. Previously outpatient at [facility] but unable to go since January. Has been on Xanax, abilify, effexor and lithium in the past.   Past Medical History:  Past Medical History:  Diagnosis Date   Anxiety attack    panic attacks   Depression    GERD (gastroesophageal reflux disease)    Headache    Myocardial infarction [facility])    age 39   Pericarditis    RA (rheumatoid arthritis) (HCC)    Still disease, 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HCC)    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HCC)    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HCC)    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HCC)     Past Surgical History:  Procedure Laterality Date   CHOLECYSTECTOMY N/A 08/29/2021   Procedure: LAPAROSCOPIC  CHOLECYSTECTOMY WITH ICG;  Surgeon: Andria Meuse, MD;  Location: [facility] ORS;  Service: General;  Laterality: N/A;   WISDOM TOOTH EXTRACTION     Family History:  Family History  Problem Relation Age of Onset   Heart disease Mother    Heart attack Mother    Depression Father    Rheum arthritis Father    Hypertension Father    Bipolar disorder Brother    Family Psychiatric  History: brother with substance and bipolar. Father with depression. Mother with anxiety Social History:  Social History   Substance and Sexual Activity  Alcohol Use Not Currently     Social History   Substance and Sexual Activity  Drug Use Not Currently   Types: Marijuana   Comment: cannabis use d/o, 2 weeks of sobriety    Social History   Socioeconomic History   Marital status: Significant Other    Spouse name: Not on file   Number of children: Not on file   Years of education: Not on file   Highest education level: Not on file  Occupational History   Not on file  Tobacco Use   Smoking status: Former    Packs/day: 1.00    Years: 5.00    Total pack years: 5.00    Types: Cigarettes, E-cigarettes    Start date: 07/27/2009    Quit date: 12/2019    Years since quitting: 2.1   Smokeless tobacco: Never  Vaping Use   Vaping Use:  Every day   Substances: Nicotine, Flavoring  Substance and Sexual Activity   Alcohol use: Not Currently   Drug use: Not Currently    Types: Marijuana    Comment: cannabis use d/o, 2 weeks of sobriety   Sexual activity: Yes    Partners: Female    Birth control/protection: None  Other Topics Concern   Not on file  Social History Narrative   Not on file   Social Determinants of Health   Financial Resource Strain: Not on file  Food Insecurity: Not on file  Transportation Needs: Not on file  Physical Activity: Not on file  Stress: Not on file  Social Connections: Not on file   Additional Social History:                         Sleep:  Poor  Appetite:  Fair  Current Medications: Current Facility-Administered Medications  Medication Dose Route Frequency Provider Last Rate Last Admin   acetaminophen (TYLENOL) tablet 650 mg  650 mg Oral Q6H PRN Carlyn Reichert, MD   650 mg at 03/08/22 0622   alum & mag hydroxide-simeth (MAALOX/MYLANTA) 200-200-20 MG/5ML suspension 30 mL  30 mL Oral Q4H PRN Carlyn Reichert, MD       amitriptyline (ELAVIL) tablet 25 mg  25 mg Oral QHS Nayquan Evinger, Shelbie Hutching, MD       ARIPiprazole (ABILIFY) tablet 10 mg  10 mg Oral Daily Carlyn Reichert, MD   10 mg at 03/08/22 0757   hydrOXYzine (ATARAX) tablet 25 mg  25 mg Oral TID PRN Carlyn Reichert, MD   25 mg at 03/08/22 0038   levothyroxine (SYNTHROID) tablet 25 mcg  25 mcg Oral Q0600 Carlyn Reichert, MD   25 mcg at 03/08/22 3220   magnesium hydroxide (MILK OF MAGNESIA) suspension 30 mL  30 mL Oral Daily PRN Carlyn Reichert, MD       naproxen (NAPROSYN) tablet 500 mg  500 mg Oral BID PRN Roselle Locus, MD   500 mg at 03/08/22 1254   nicotine (NICODERM CQ - dosed in mg/24 hours) patch 21 mg  21 mg Transdermal Daily Carlyn Reichert, MD   21 mg at 03/07/22 2542   pantoprazole (PROTONIX) EC tablet 40 mg  40 mg Oral Daily Carlyn Reichert, MD   40 mg at 03/08/22 0757   rosuvastatin (CRESTOR) tablet 5 mg  5 mg Oral Daily Carlyn Reichert, MD   5 mg at 03/07/22 2107   Vitamin D (Ergocalciferol) (DRISDOL) 1.25 MG (50000 UNIT) capsule 50,000 Units  50,000 Units Oral Q7 days Roselle Locus, MD   50,000 Units at 03/07/22 0900    Lab Results:  Results for orders placed or performed during the hospital encounter of 03/06/22 (from the past 48 hour(s))  Urinalysis, Complete w Microscopic Urine, Clean Catch     Status: Abnormal   Collection Time: 03/07/22  1:37 PM  Result Value Ref Range   Color, Urine YELLOW YELLOW   APPearance CLOUDY (A) CLEAR   Specific Gravity, Urine 1.012 1.005 - 1.030   pH 5.0 5.0 - 8.0   Glucose, UA NEGATIVE NEGATIVE mg/dL   Hgb  urine dipstick SMALL (A) NEGATIVE   Bilirubin Urine NEGATIVE NEGATIVE   Ketones, ur NEGATIVE NEGATIVE mg/dL   Protein, ur NEGATIVE NEGATIVE mg/dL   Nitrite NEGATIVE NEGATIVE   Leukocytes,Ua NEGATIVE NEGATIVE   RBC / HPF 0-5 0 - 5 RBC/hpf   WBC, UA 0-5 0 - 5 WBC/hpf   Bacteria,  UA RARE (A) NONE SEEN   Squamous Epithelial / LPF 21-50 0 - 5   Mucus PRESENT     Comment: Performed at [facility], [address] [place], [place] [address]  Magnesium     Status: None   Collection Time: 03/08/22  6:29 AM  Result Value Ref Range   Magnesium 2.4 1.7 - 2.4 mg/dL    Comment: Performed at [facility], [address] [place], [place] [address]  Vitamin B12     Status: None   Collection Time: 03/08/22  6:29 AM  Result Value Ref Range   Vitamin B-12 361 180 - 914 pg/mL    Comment: (NOTE) This assay is not validated for testing neonatal or myeloproliferative syndrome specimens for Vitamin B12 levels. Performed at [facility], [address], [place], [place] [address]     Blood Alcohol level:  Lab Results  Component Value Date   ETH <10 03/06/2022   ETH <10 05/01/2019    Metabolic Disorder Labs: Lab Results  Component Value Date   HGBA1C 4.9 03/06/2022   MPG 93.93 03/06/2022   MPG 99.67 05/03/2019   No results found for: "PROLACTIN" Lab Results  Component Value Date   CHOL 185 03/06/2022   TRIG 102 03/06/2022   HDL 36 (L) 03/06/2022   CHOLHDL 5.1 03/06/2022   VLDL 20 03/06/2022   LDLCALC 129 (H) 03/06/2022   LDLCALC 87 01/04/2022    Physical Findings: AIMS:  , ,  ,  ,    CIWA:    COWS:     Musculoskeletal: Strength & Muscle Tone: within normal limits Gait & Station:  antalgic Patient leans: Left  Psychiatric Specialty Exam:  Presentation  General Appearance: Appropriate for Environment  Eye Contact:Fair  Speech:Normal Rate  Speech Volume:Normal  Handedness:Right   Mood and Affect   Mood:Anxious  Affect:Constricted   Thought Process  Thought Processes:Linear  Descriptions of Associations:Intact  Orientation:Full (Time, Place and Person)  Thought Content:Logical  History of Schizophrenia/Schizoaffective disorder:No data recorded Duration of Psychotic Symptoms:No data recorded Hallucinations:Hallucinations: None  Ideas of Reference:None  Suicidal Thoughts:Suicidal Thoughts: No  Homicidal Thoughts:Homicidal Thoughts: No   Sensorium  Memory:Immediate Good; Recent Fair; Remote Fair  Judgment:Fair  Insight:Fair   Executive Functions  Concentration:Fair  Attention Span:Fair  Recall:Fair  Fund of Knowledge:Fair  Language:Fair   Psychomotor Activity  Psychomotor Activity:Psychomotor Activity: Normal   Assets  Assets:Desire for Improvement; Communication Skills   Sleep  Sleep:Sleep: Poor    Physical Exam: Physical Exam Vitals and nursing note reviewed.  Constitutional:      Appearance: He is obese.  HENT:     Head: Normocephalic.     Nose: Nose normal.  Eyes:     Extraocular Movements: Extraocular movements intact.  Cardiovascular:     Rate and Rhythm: Normal rate.  Pulmonary:     Effort: Pulmonary effort is normal.  Musculoskeletal:        General: Normal range of motion.     Cervical back: Normal range of motion.  Neurological:     General: No focal deficit present.     Mental Status: He is alert and oriented to person, place, and time.    Review of Systems  Constitutional:  Negative for chills and fever.  HENT:  Negative for hearing loss.   Respiratory:  Negative for cough.   Cardiovascular:  Negative for chest pain and palpitations.  Gastrointestinal:  Negative for constipation and diarrhea.  Musculoskeletal:  Positive for  back pain, joint pain and myalgias.   Blood pressure 123/84, pulse 82, temperature 98.5 F (36.9 C), temperature source Oral, resp. rate (!) 24, height 5\' 3"  (1.6 m), weight 122.5 kg, SpO2 98  %. Body mass index is 47.83 kg/m.   Treatment Plan Summary: Daily contact with patient to assess and evaluate symptoms and progress in treatment and Medication management  Medications: Mood/anxiety: continue group therapy, milieu therapy, 1:1 evaluation with provider.  Continue abilify for mood stabilization Started elavil 25mg  PO QHS for sleep (also will help with pain) Pain management: Group, milieu therapy, 1:1 evaluation with provider.  Added naprosyn for pain Substance Abuse: brief intervention provided abstinence advised.   Medical: PRNs for pain, constipation, indigestion available.  Safety and Monitoring: In/voluntary admission to inpatient psychiatric unit for safety, stabilization and treatment Daily contact with patient to assess and evaluate symptoms and progress in treatment Patient's case to be discussed in multi-disciplinary team meeting Observation Level : q15 minute checks Vital signs: q12 hours Precautions: suicide, withdrawal, fall   , MD 03/08/2022, 4:01 PM

## 2022-03-08 NOTE — Group Note (Signed)
[facility] LCSW Group Therapy Note   Group Date: 03/08/2022 Start Time: 1300 End Time: 1400   Type of Therapy and Topic: Group Therapy: Avoiding Self-Sabotaging and Enabling Behaviors  Participation Level: Did Not Attend  Mood:  Description of Group:  In this group, patients will learn how to identify obstacles, self-sabotaging and enabling behaviors, as well as: what are they, why do we do them and what needs these behaviors meet. Discuss unhealthy relationships and how to have positive healthy boundaries with those that sabotage and enable. Explore aspects of self-sabotage and enabling in yourself and how to limit these self-destructive behaviors in everyday life.   Therapeutic Goals: 1. Patient will identify one obstacle that relates to self-sabotage and enabling behaviors 2. Patient will identify one personal self-sabotaging or enabling behavior they did prior to admission 3. Patient will state a plan to change the above identified behavior 4. Patient will demonstrate ability to communicate their needs through discussion and/or role play.    Summary of Patient Progress:   Patient did not attend group despite encouraged participation.    Therapeutic Modalities:  Cognitive Behavioral Therapy Person-Centered Therapy Motivational Interviewing    Isobelle Tuckett W Eithen Castiglia, LCSWA 

## 2022-03-08 NOTE — [facility] Group Notes (Signed)
Patient did not attend group.

## 2022-03-08 NOTE — Progress Notes (Signed)
Patient woke up after taking Trazodone stated that its giving them vivid dreams. Patient was anxious PRN atarax given.. Support and encouragement provided. Q 15 minutes safety checks ongoing Patient remains safe.

## 2022-03-08 NOTE — Progress Notes (Signed)
Patient appears fatigued. Patient denies SI/HI/AVH. Patient complied with morning medication with no reported side effects. Pt was requesting pain medication for right hip pain 7/10 and sleep medication. Pt states that the trazodone was giving him vivid nightmares. Pt states that his right hip has feelings of pin and needles with numbness. Patient remains safe on Q40min checks and contracts for safety.      03/08/22 0849  Psych Admission Type (Psych Patients Only)  Admission Status Voluntary  Psychosocial Assessment  Patient Complaints Anxiety;Sleep disturbance  Eye Contact Fair  Facial Expression Anxious;Sad  Affect Depressed  Speech Logical/coherent  Interaction Assertive  Motor Activity Slow  Appearance/Hygiene Unremarkable  Behavior Characteristics Cooperative;Calm  Mood Depressed  Thought Process  Coherency WDL  Content WDL  Delusions None reported or observed  Perception WDL  Hallucination None reported or observed  Judgment Impaired  Confusion None  Danger to Self  Current suicidal ideation? Denies  Agreement Not to Harm Self Yes  Description of Agreement verbal  Danger to Others  Danger to Others None reported or observed

## 2022-03-08 NOTE — Plan of Care (Signed)
  Problem: Education: Goal: Knowledge of General Education information will improve Description: Including pain rating scale, medication(s)/side effects and non-pharmacologic comfort measures Outcome: Progressing   Problem: Health Behavior/Discharge Planning: Goal: Ability to manage health-related needs will improve Outcome: Progressing   Problem: Clinical Measurements: Goal: Ability to maintain clinical measurements within normal limits will improve Outcome: Progressing Goal: Will remain free from infection Outcome: Progressing Goal: Diagnostic test results will improve Outcome: Progressing Goal: Respiratory complications will improve Outcome: Progressing Goal: Cardiovascular complication will be avoided Outcome: Progressing   Problem: Activity: Goal: Risk for activity intolerance will decrease Outcome: Progressing   Problem: Nutrition: Goal: Adequate nutrition will be maintained Outcome: Progressing   Problem: Coping: Goal: Level of anxiety will decrease Outcome: Progressing   Problem: Elimination: Goal: Will not experience complications related to bowel motility Outcome: Progressing Goal: Will not experience complications related to urinary retention Outcome: Progressing   Problem: Pain Managment: Goal: General experience of comfort will improve Outcome: Progressing   Problem: Safety: Goal: Ability to remain free from injury will improve Outcome: Progressing   Problem: Skin Integrity: Goal: Risk for impaired skin integrity will decrease Outcome: Progressing   Problem: Education: Goal: Knowledge of [place] [facility] Education information/materials will improve Outcome: Progressing Goal: Emotional status will improve Outcome: Progressing Goal: Mental status will improve Outcome: Progressing Goal: Verbalization of understanding the information provided will improve Outcome: Progressing   Problem: Activity: Goal: Interest or engagement in activities will  improve Outcome: Progressing Goal: Sleeping patterns will improve Outcome: Progressing   Problem: Coping: Goal: Ability to verbalize frustrations and anger appropriately will improve Outcome: Progressing Goal: Ability to demonstrate self-control will improve Outcome: Progressing   Problem: Health Behavior/Discharge Planning: Goal: Identification of resources available to assist in meeting health care needs will improve Outcome: Progressing Goal: Compliance with treatment plan for underlying cause of condition will improve Outcome: Progressing   Problem: Physical Regulation: Goal: Ability to maintain clinical measurements within normal limits will improve Outcome: Progressing   Problem: Safety: Goal: Periods of time without injury will increase Outcome: Progressing   

## 2022-03-08 NOTE — Progress Notes (Signed)
   03/08/22 2100  Psych Admission Type (Psych Patients Only)  Admission Status Voluntary  Psychosocial Assessment  Patient Complaints Anxiety  Eye Contact Fair  Facial Expression Anxious  Affect Depressed  Speech Logical/coherent  Interaction Assertive  Motor Activity Slow  Appearance/Hygiene Unremarkable  Behavior Characteristics Cooperative  Mood Depressed  Aggressive Behavior  Effect No apparent injury  Thought Process  Coherency WDL  Content WDL  Delusions None reported or observed  Perception WDL  Hallucination None reported or observed  Judgment WDL  Confusion None  Danger to Self  Current suicidal ideation? Denies  Danger to Others  Danger to Others None reported or observed

## 2022-03-08 NOTE — Progress Notes (Signed)
Pt attended NA group

## 2022-03-08 NOTE — BH IP Treatment Plan (Signed)
Interdisciplinary Treatment and Diagnostic Plan Update  03/08/2022 Time of Session: 9:10am  Yuri Flener Knouff MRN: [id number]  Principal Diagnosis: Bipolar affective disorder, currently depressed, moderate ([place])  Secondary Diagnoses: Principal Problem:   Bipolar affective disorder, currently depressed, moderate ([place]) Active Problems:   Social phobia   Tobacco use   GAD (generalized anxiety disorder)   Marijuana use   Bipolar depression ([place])   Current Medications:  Current Facility-Administered Medications  Medication Dose Route Frequency Provider Last Rate Last Admin   acetaminophen (TYLENOL) tablet 650 mg  650 mg Oral Q6H PRN Corky Sox, MD   650 mg at 03/08/22 0622   alum & mag hydroxide-simeth (MAALOX/MYLANTA) 200-200-20 MG/5ML suspension 30 mL  30 mL Oral Q4H PRN Corky Sox, MD       amitriptyline (ELAVIL) tablet 25 mg  25 mg Oral QHS Hill, Jackie Plum, MD       ARIPiprazole (ABILIFY) tablet 10 mg  10 mg Oral Daily Corky Sox, MD   10 mg at 03/08/22 0757   hydrOXYzine (ATARAX) tablet 25 mg  25 mg Oral TID PRN Corky Sox, MD   25 mg at 03/08/22 0038   levothyroxine (SYNTHROID) tablet 25 mcg  25 mcg Oral Q0600 Corky Sox, MD   25 mcg at 03/08/22 2458   magnesium hydroxide (MILK OF MAGNESIA) suspension 30 mL  30 mL Oral Daily PRN Corky Sox, MD       naproxen (NAPROSYN) tablet 500 mg  500 mg Oral BID PRN Maida Sale, MD   500 mg at 03/08/22 1254   nicotine (NICODERM CQ - dosed in mg/24 hours) patch 21 mg  21 mg Transdermal Daily Corky Sox, MD   21 mg at 03/07/22 0823   pantoprazole (PROTONIX) EC tablet 40 mg  40 mg Oral Daily Corky Sox, MD   40 mg at 03/08/22 0757   rosuvastatin (CRESTOR) tablet 5 mg  5 mg Oral Daily Corky Sox, MD   5 mg at 03/07/22 2107   Vitamin D (Ergocalciferol) (DRISDOL) 1.25 MG (50000 UNIT) capsule 50,000 Units  50,000 Units Oral Q7 days Maida Sale, MD   50,000 Units at 03/07/22 0900   PTA  Medications: Medications Prior to Admission  Medication Sig Dispense Refill Last Dose   ARIPiprazole (ABILIFY) 10 MG tablet Take 1 tablet (10 mg total) by mouth daily. 90 tablet 0    ibuprofen (ADVIL) 200 MG tablet Take 800 mg by mouth every 6 (six) hours as needed (For leg pain).      levothyroxine (SYNTHROID) 25 MCG tablet Take 1 tablet (25 mcg total) by mouth daily. 90 tablet 1    nicotine (NICODERM CQ - DOSED IN MG/24 HOURS) 21 mg/24hr patch Place 1 patch (21 mg total) onto the skin daily. 28 patch 0    omeprazole (PRILOSEC) 40 MG capsule Take 1 capsule (40 mg total) by mouth daily. 90 capsule 2    rosuvastatin (CRESTOR) 5 MG tablet Take 1 tablet (5 mg total) by mouth daily. (Patient taking differently: Take 5 mg by mouth at bedtime.) 30 tablet 1    testosterone cypionate (DEPOTESTOSTERONE CYPIONATE) 200 MG/ML injection Inject 40 mg into the muscle every Wednesday.      traZODone (DESYREL) 50 MG tablet Take 1 tablet (50 mg total) by mouth at bedtime as needed for sleep.      Vitamin D, Ergocalciferol, (DRISDOL) 1.25 MG (50000 UNIT) CAPS capsule Take 1 capsule (50,000 Units total) by mouth every 7 (seven) days. (Patient taking differently: Take 50,000  Units by mouth every Friday.) 5 capsule 3     Patient Stressors: Financial difficulties   Medication change or noncompliance    Patient Strengths: Ability for insight  Average or above average intelligence  Financial means  Special hobby/interest  Supportive family/friends   Treatment Modalities: Medication Management, Group therapy, Case management,  1 to 1 session with clinician, Psychoeducation, Recreational therapy.   Physician Treatment Plan for Primary Diagnosis: Bipolar affective disorder, currently depressed, moderate ([place]) Long Term Goal(s): Improvement in symptoms so as ready for discharge   Short Term Goals: Ability to identify changes in lifestyle to reduce recurrence of condition will improve Ability to verbalize feelings  will improve Ability to disclose and discuss suicidal ideas Ability to demonstrate self-control will improve Ability to identify and develop effective coping behaviors will improve Ability to maintain clinical measurements within normal limits will improve Compliance with prescribed medications will improve Ability to identify triggers associated with substance abuse/mental health issues will improve  Medication Management: Evaluate patient's response, side effects, and tolerance of medication regimen.  Therapeutic Interventions: 1 to 1 sessions, Unit Group sessions and Medication administration.  Evaluation of Outcomes: Not Met  Physician Treatment Plan for Secondary Diagnosis: Principal Problem:   Bipolar affective disorder, currently depressed, moderate ([place]) Active Problems:   Social phobia   Tobacco use   GAD (generalized anxiety disorder)   Marijuana use   Bipolar depression ([place])  Long Term Goal(s): Improvement in symptoms so as ready for discharge   Short Term Goals: Ability to identify changes in lifestyle to reduce recurrence of condition will improve Ability to verbalize feelings will improve Ability to disclose and discuss suicidal ideas Ability to demonstrate self-control will improve Ability to identify and develop effective coping behaviors will improve Ability to maintain clinical measurements within normal limits will improve Compliance with prescribed medications will improve Ability to identify triggers associated with substance abuse/mental health issues will improve     Medication Management: Evaluate patient's response, side effects, and tolerance of medication regimen.  Therapeutic Interventions: 1 to 1 sessions, Unit Group sessions and Medication administration.  Evaluation of Outcomes: Not Met   RN Treatment Plan for Primary Diagnosis: Bipolar affective disorder, currently depressed, moderate ([place]) Long Term Goal(s): Knowledge of disease and  therapeutic regimen to maintain health will improve  Short Term Goals: Ability to remain free from injury will improve, Ability to participate in decision making will improve, Ability to verbalize feelings will improve, Ability to disclose and discuss suicidal ideas, and Ability to identify and develop effective coping behaviors will improve  Medication Management: RN will administer medications as ordered by provider, will assess and evaluate patient's response and provide education to patient for prescribed medication. RN will report any adverse and/or side effects to prescribing provider.  Therapeutic Interventions: 1 on 1 counseling sessions, Psychoeducation, Medication administration, Evaluate responses to treatment, Monitor vital signs and CBGs as ordered, Perform/monitor CIWA, COWS, AIMS and Fall Risk screenings as ordered, Perform wound care treatments as ordered.  Evaluation of Outcomes: Not Met   LCSW Treatment Plan for Primary Diagnosis: Bipolar affective disorder, currently depressed, moderate ([place]) Long Term Goal(s): Safe transition to appropriate next level of care at discharge, Engage patient in therapeutic group addressing interpersonal concerns.  Short Term Goals: Engage patient in aftercare planning with referrals and resources, Increase social support, Increase emotional regulation, Facilitate acceptance of mental health diagnosis and concerns, Identify triggers associated with mental health/substance abuse issues, and Increase skills for wellness and recovery  Therapeutic Interventions: Assess  for all discharge needs, 1 to 1 time with Education officer, museum, Explore available resources and support systems, Assess for adequacy in community support network, Educate family and significant other(s) on suicide prevention, Complete Psychosocial Assessment, Interpersonal group therapy.  Evaluation of Outcomes: Not Met   Progress in Treatment: Attending groups: Yes. Participating in groups:  Yes. Taking medication as prescribed: Yes. Toleration medication: Yes. Family/Significant other contact made: No, will contact:  Patient declined consents  Patient understands diagnosis: Yes. Discussing patient identified problems/goals with staff: Yes. Medical problems stabilized or resolved: Yes. Denies suicidal/homicidal ideation: Yes. Issues/concerns per patient self-inventory: No.   New problem(s) identified: No, Describe:  None   New Short Term/Long Term Goal(s): medication stabilization, elimination of SI thoughts, development of comprehensive mental wellness plan.   Patient Goals: "To get back on my medications"   Discharge Plan or Barriers: Patient recently admitted. CSW will continue to follow and assess for appropriate referrals and possible discharge planning.   Reason for Continuation of Hospitalization: Anxiety Depression Medication stabilization Suicidal ideation  Estimated Length of Stay: 3 to 7 days   Last [place] Suicide Severity Risk Score: [place] Admission (Current) from 03/06/2022 in [facility] Pre-admit from 03/05/2022 in [facility] ED from 01/10/2022 in [facility] No Risk High Risk No Risk       Last PHQ 2/9 Scores:    02/15/2022    9:26 AM 01/04/2022   11:41 AM  Depression screen PHQ 2/9  Decreased Interest 3 3  Down, Depressed, Hopeless 3 3  PHQ - 2 Score 6 6  Altered sleeping 3 3  Tired, decreased energy 3 3  Change in appetite 0 3  Feeling bad or failure about yourself  1 2  Trouble concentrating 3 3  Moving slowly or fidgety/restless 2 2  Suicidal thoughts 0 0  PHQ-9 Score 18 22  Difficult doing work/chores Very difficult     Scribe for Treatment Team: Darleen Crocker, Latanya Presser 03/08/2022 2:56 PM

## 2022-03-09 DIAGNOSIS — F3132 Bipolar disorder, current episode depressed, moderate: Principal | ICD-10-CM

## 2022-03-09 MED ORDER — ARIPIPRAZOLE 10 MG PO TABS: 10.0000 mg | ORAL_TABLET | Freq: Every day | ORAL | 0 refills | Status: DC

## 2022-03-09 MED ORDER — AMITRIPTYLINE HCL 25 MG PO TABS: 25.0000 mg | ORAL_TABLET | Freq: Every day | ORAL | 0 refills | Status: DC

## 2022-03-09 MED ORDER — HYDROXYZINE HCL 25 MG PO TABS
25.0000 mg | ORAL_TABLET | Freq: Three times a day (TID) | ORAL | 0 refills | Status: DC | PRN
Start: 2022-03-09 — End: 2022-12-07

## 2022-03-09 MED ORDER — VITAMIN D (ERGOCALCIFEROL) 1.25 MG (50000 UNIT) PO CAPS: 50000.0000 [IU] | ORAL_CAPSULE | ORAL | 0 refills | Status: DC

## 2022-03-09 NOTE — BHH Group Notes (Signed)
Adult Psychoeducational Group Note  Date:  03/09/2022 Time:  9:52 AM  Group Topic/Focus:  Goals Group:   The focus of this group is to help patients establish daily goals to achieve during treatment and discuss how the patient can incorporate goal setting into their daily lives to aide in recovery.  Participation Level:  Active  Participation Quality:  Appropriate  Affect:  Appropriate  Cognitive:  Appropriate  Insight: Appropriate  Engagement in Group:  Engaged  Modes of Intervention:  Discussion   Donell Beers 03/09/2022, 9:52 AM

## 2022-03-09 NOTE — Progress Notes (Signed)
  [facility] Adult Case Management Discharge Plan :  Will you be returning to the same living situation after discharge:  Yes,  Patient to discharge to place of residence.  At discharge, do you have transportation home?: Yes,  Patient's POV located on site, to driver himself home.  Do you have the ability to pay for your medications: Yes,  [place] Medicaid  Release of information consent forms completed and in the chart;  Patient's signature needed at discharge.  Patient to Follow up at:  Follow-up Information     Services, [facility]. Go on 03/14/2022.   Why: You have a hospital follow up appointment for medication management services on  03/14/22 at 9:00 am.   This appointment will be held in person. Contact information: [address] [place] [address] [phone]         Services, [facility] Follow up on 03/16/2022.   Specialty: Behavioral Health Why: You have an appointment on 03/16/22 at 10:00 am for therapy services. This appointment will be Virtual telehealth. Contact information: [address] [place] [address] [place] [address] [phone]                 Next level of care provider has access to [facility] Link:no  Safety Planning and Suicide Prevention discussed: Yes,  SPE Completed with patient, declined consent for CSW to reach family/friend.  CSW completed SPE with patient. Discussed potential triggers leading to suicidal ideation in addition to coping skills one might use in order to delay and distract self from self harming behaviors. CSW encouraged patient to utilize emergency services if they felt unable to maintain their safety. SPE flyer provided to patient at this time.    Has patient been referred to the Quitline?: N/A patient is not a smoker Tobacco Use: Medium Risk (03/07/2022)   Patient History    Smoking Tobacco Use: Former    Smokeless Tobacco Use: Never    Passive Exposure: Not on file   Patient has been referred for addiction  treatment: Pt. refused referral Social History   Substance and Sexual Activity  Alcohol Use Not Currently   Social History   Substance and Sexual Activity  Drug Use Not Currently   Types: Marijuana   Comment: cannabis use d/o, 2 weeks of sobriety   Corky Crafts, LCSWA 03/09/2022, 9:08 AM

## 2022-03-09 NOTE — Discharge Summary (Signed)
Physician Discharge Summary Note  Patient:  [name] is an 31 y.o., adult MRN:  [id number] DOB:  1990-08-24 Patient phone:  [phone] (home)  Patient address:   [address] [place] [address],  Total Time spent with patient: 20 minutes  Date of Admission:  03/06/2022 Date of Discharge: 03/09/2022  Reason for Admission:   History of Present Illness: "[name]" is a 31 YO F to M transgender patient with a history of bipolar disorder, GAD and cannabis use who presented with worsening mood instability, irritability and feeling agitated on a voluntary basis to [facility] ER. After medical clearance he was admitted to inpatient [facility].              On interview, the patient is pleasant and cooperative. He explains that he was doing well for 3 years. When his coverage changed and no longer allowed him to continue with his care at [facility], he went off medications, thinking he was stable. He noted that since this happened in January, his mood has been in decline, with increasing irritability and agitation. He works third shift, so sometimes sleep is good and sometimes not at all. Regardless, he is always fatigued. Appetite is poor. Pain from right leg sciatica, hip and back pain is very high and he is frustrated with not being able to get relief. He has been anxious and has racing thoughts about his worries all the time. Sometimes he thinks he sees shadows out of the corners but no frank hallucinations, paranoia or delusions. He would like to get restarted on medications quickly and return to previous stable state.   Principal Problem: Bipolar affective disorder, currently depressed, moderate (HCC) Discharge Diagnoses: Principal Problem:   Bipolar affective disorder, currently depressed, moderate (HCC) Active Problems:   Social phobia   Tobacco use   GAD (generalized anxiety disorder)   Marijuana use   Bipolar depression (HCC)   Past Psychiatric History: at [facility] 2020.  Previously outpatient at [facility] but unable to go since January. Has been on sanax, abilify, effexor and lithium in the past.   Past Medical History:  Past Medical History:  Diagnosis Date   Anxiety attack    panic attacks   Depression    GERD (gastroesophageal reflux disease)    Headache    Myocardial infarction [facility])    age 55   Pericarditis    RA (rheumatoid arthritis) (HCC)    Still disease, 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HCC)    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HCC)    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HCC)    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HCC)     Past Surgical History:  Procedure Laterality Date   CHOLECYSTECTOMY N/A 08/29/2021   Procedure: LAPAROSCOPIC CHOLECYSTECTOMY WITH ICG;  Surgeon: Andria Meuse, MD;  Location: [facility] ORS;  Service: General;  Laterality: N/A;   WISDOM TOOTH EXTRACTION     Family History:  Family History  Problem Relation Age of Onset   Heart disease Mother    Heart attack Mother    Depression Father    Rheum arthritis Father    Hypertension Father    Bipolar disorder Brother    Family Psychiatric  History: brother with substance and bipolar. Father with depression. Mother with anxiety. Social History:  Social History   Substance and Sexual Activity  Alcohol Use Not Currently     Social History   Substance and Sexual Activity  Drug Use Not Currently   Types: Marijuana   Comment: cannabis use d/o, 2 weeks of sobriety  Social History   Socioeconomic History   Marital status: Significant Other    Spouse name: Not on file   Number of children: Not on file   Years of education: Not on file   Highest education level: Not on file  Occupational History   Not on file  Tobacco Use   Smoking status: Former    Packs/day: 1.00    Years: 5.00    Total pack years: 5.00    Types: Cigarettes, E-cigarettes    Start date: 07/27/2009    Quit date: 12/2019    Years since quitting: 2.1   Smokeless tobacco: Never  Vaping Use   Vaping Use: Every day   Substances: Nicotine, Flavoring  Substance and  Sexual Activity   Alcohol use: Not Currently   Drug use: Not Currently    Types: Marijuana    Comment: cannabis use d/o, 2 weeks of sobriety   Sexual activity: Yes    Partners: Female    Birth control/protection: None  Other Topics Concern   Not on file  Social History Narrative   Not on file   Social Determinants of Health   Financial Resource Strain: Not on file  Food Insecurity: Not on file  Transportation Needs: Not on file  Physical Activity: Not on file  Stress: Not on file  Social Connections: Not on file    Hospital Course:  During the course of patient's hospitalization, the 15-minute checks were adequate to ensure patient's safety. Patient did not exhibit erratic or aggressive behavior and was compliant with scheduled medication. Patient was recommended for outpatient psychiatry.  At the time of discharge patient is not reporting any acute suicidal/homicidal ideations/AVH, delusional thoughts or paranoia. Patient did not appear to be responding to any internal stimuli. Patient feels more confident about self-care & in managing their mental health problems. Patient currently denies any new issues or concerns. Education and supportive counseling provided throughout patient's hospital stay & upon discharge.   Today upon discharge evaluation, the patient gives a mood of "pretty good, actually". Patient denies any specific concerns. Patient slept well, appetite good, regular bowel movements. Patient denies any new physical complaints, and plans to attend the previously scheduled pain clinic follow up appointment. Patient feels that the medications have been helpful & is in agreement to continue current treatment regimen as recommended. Patient was able to engage in safety planning including plan to return to [facility] or contact emergency services if patient feels unable to maintain their own safety or the safety of others. Patient had no further questions, comments, or concerns. Patient left  [facility] with all personal belongings in no apparent distress. Transportation per safe transport to home was arranged for patient.   Physical Findings: AIMS:  , ,  ,  ,    CIWA:    COWS:     Musculoskeletal: Strength & Muscle Tone: within normal limits Gait & Station:  antalgic Patient leans: Left   Psychiatric Specialty Exam:  Presentation  General Appearance: Appropriate for Environment  Eye Contact:Fair  Speech:Normal Rate  Speech Volume:Normal  Handedness:Right   Mood and Affect  Mood:Anxious  Affect:Constricted   Thought Process  Thought Processes:Linear  Descriptions of Associations:Intact  Orientation:Full (Time, Place and Person)  Thought Content:Logical  History of Schizophrenia/Schizoaffective disorder:No data recorded Duration of Psychotic Symptoms:No data recorded Hallucinations:Hallucinations: None  Ideas of Reference:None  Suicidal Thoughts:Suicidal Thoughts: No  Homicidal Thoughts:Homicidal Thoughts: No   Sensorium  Memory:Immediate Good; Recent Fair; Remote Fair  Judgment:Fair  Insight:Fair  Executive Functions  Concentration:Fair  Attention Span:Fair  Recall:Fair  [employer] of Knowledge:Fair  Language:Fair   Psychomotor Activity  Psychomotor Activity:Psychomotor Activity: Normal   Assets  Assets:Desire for Improvement; Communication Skills   Sleep  Sleep:Sleep: Poor  Physical Exam: Physical Exam Vitals and nursing note reviewed.  Constitutional:      Appearance: Normal appearance. He is obese.  HENT:     Head: Normocephalic and atraumatic.     Nose: Nose normal.  Eyes:     Extraocular Movements: Extraocular movements intact.  Pulmonary:     Effort: Pulmonary effort is normal.  Musculoskeletal:        General: Normal range of motion.     Cervical back: Normal range of motion.  Neurological:     General: No focal deficit present.     Mental Status: He is alert and oriented to person, place, and time.   Psychiatric:        Mood and Affect: Mood normal.        Behavior: Behavior normal.        Thought Content: Thought content normal.        Judgment: Judgment normal.      Review of Systems  Constitutional:  Negative for chills and fever.  HENT:  Negative for hearing loss.   Eyes:  Negative for blurred vision.  Respiratory:  Negative for cough.   Genitourinary:  Negative for dysuria.  Musculoskeletal:  Positive for back pain, joint pain and myalgias.  Skin:  Negative for rash.  Neurological:  Negative for dizziness and headaches.  Psychiatric/Behavioral:  Negative for hallucinations and suicidal ideas. The patient is not nervous/anxious and does not have insomnia.     Blood pressure (!) 163/120, pulse (!) 122, temperature 98.6 F (37 C), temperature source Oral, resp. rate 20, height  (1.6 m), weight 122.5 kg, SpO2 98 %. Body mass index is 47.83 kg/m.     Social History   Tobacco Use  Smoking Status Former   Packs/day: 1.00   Years: 5.00   Total pack years: 5.00   Types: Cigarettes, E-cigarettes   Start date: 07/27/2009   Quit date: 12/2019   Years since quitting: 2.1  Smokeless Tobacco Never   Tobacco Cessation:  A prescription for an FDA-approved tobacco cessation medication was offered at discharge and the patient refused   Blood Alcohol level:  Lab Results  Component Value Date   [facility] <10 03/06/2022   ETH <10 05/01/2019    Metabolic Disorder Labs:  Lab Results  Component Value Date   HGBA1C 4.9 03/06/2022   MPG 93.93 03/06/2022   MPG 99.67 05/03/2019   No results found for: "PROLACTIN" Lab Results  Component Value Date   CHOL 185 03/06/2022   TRIG 102 03/06/2022   HDL 36 (L) 03/06/2022   CHOLHDL 5.1 03/06/2022   VLDL 20 03/06/2022   LDLCALC 129 (H) 03/06/2022   LDLCALC 87 01/04/2022    See Psychiatric Specialty Exam and Suicide Risk Assessment completed by Attending Physician prior to discharge.  Discharge destination:  Home  Is patient  on multiple antipsychotic therapies at discharge:  No     Discharge Instructions     Diet - low sodium heart healthy   Complete by: As directed    Discharge instructions   Complete by: As directed    Prescriptions for new medications provided for the patient to bridge to follow up appointment. The patient was informed that refills for these prescriptions are generally not provided, and patient  is encouraged to attend all follow up appointments to address medication refills and adjustments.   Today's discharge was reviewed with treatment team, and the team is in agreement that the patient is ready for discharge. The patient is was of the discharge plan for today and has been given opportunity to ask questions. At time of discharge, the patient does not vocalize any acute harm to self or others, is goal directed, able to advocate for self and organizational baseline.   At discharge, the patient is instructed to:  Take all medications as prescribed. Report any adverse effects and or reactions from the medicines to her outpatient provider promptly.  Do not engage in alcohol and/or illegal drug use while on prescription medicines.  In the event of worsening symptoms, patient is instructed to call the crisis hotline, 911 and or go to the nearest ED for appropriate evaluation and treatment of symptoms.  Follow-up with primary care provider for further care of medical issues, concerns and or health care needs. * follow up with primary care for further evaluation and treatment of blood pressure and cholesterol. Follow up with pain clinic for pain management.   Increase activity slowly   Complete by: As directed       Allergies as of 03/09/2022       Reactions   Strawberry Extract Itching, Rash        Medication List     STOP taking these medications    nicotine 21 mg/24hr patch Commonly known as: NICODERM CQ - dosed in mg/24 hours   traZODone 50 MG tablet Commonly known as: DESYREL        TAKE these medications      Indication  amitriptyline 25 MG tablet Commonly known as: ELAVIL Take 1 tablet (25 mg total) by mouth at bedtime.  Indication: Depressive Phase of Manic-Depression   ARIPiprazole 10 MG tablet Commonly known as: Abilify Take 1 tablet (10 mg total) by mouth daily.  Indication: MIXED BIPOLAR AFFECTIVE DISORDER   hydrOXYzine 25 MG tablet Commonly known as: ATARAX Take 1 tablet (25 mg total) by mouth 3 (three) times daily as needed for anxiety.  Indication: Feeling Anxious   ibuprofen 200 MG tablet Commonly known as: ADVIL Take 800 mg by mouth every 6 (six) hours as needed (For leg pain).  Indication: Pain   levothyroxine 25 MCG tablet Commonly known as: SYNTHROID Take 1 tablet (25 mcg total) by mouth daily.  Indication: Underactive Thyroid   omeprazole 40 MG capsule Commonly known as: PRILOSEC Take 1 capsule (40 mg total) by mouth daily.  Indication: Gastroesophageal Reflux Disease   rosuvastatin 5 MG tablet Commonly known as: CRESTOR Take 1 tablet (5 mg total) by mouth daily. What changed: when to take this  Indication: High Amount of Fats in the Blood   testosterone cypionate 200 MG/ML injection Commonly known as: DEPOTESTOSTERONE CYPIONATE Inject 40 mg into the muscle every Wednesday.  Indication: Transgender Man   Vitamin D (Ergocalciferol) 1.25 MG (50000 UNIT) Caps capsule Commonly known as: DRISDOL Take 1 capsule (50,000 Units total) by mouth every 7 (seven) days. Start taking on: March 14, 2022 What changed: when to take this  Indication: Vitamin D Deficiency        Follow-up Information     Services, [facility]. Go on 03/14/2022.   Why: You have a hospital follow up appointment for medication management services on  03/14/22 at 9:00 am.   This appointment will be held in person. Contact information: [address] [place] [address] [phone]         [facility], [facility] Follow up on 03/16/2022.    Specialty: Behavioral Health Why: You have an appointment on 03/16/22 at 10:00 am for therapy services. This appointment will be Virtual telehealth. Contact information: [address] [place] [address] [place] [address] [phone]                 Follow-up recommendations:  Activity:  as tolerated Diet:  low sodium, low fat Prescriptions for new medications provided for the patient to bridge to follow up appointment. The patient was informed that refills for these prescriptions are generally not provided, and patient is encouraged to attend all follow up appointments to address medication refills and adjustments.    Today's discharge was reviewed with treatment team, and the team is in agreement that the patient is ready for discharge. The patient is was of the discharge plan for today and has been given opportunity to ask questions. At time of discharge, the patient does not vocalize any acute harm to self or others, is goal directed, able to advocate for self and organizational baseline.    At discharge, the patient is instructed to:  Take all medications as prescribed. Report any adverse effects and or reactions from the medicines to her outpatient provider promptly.  Do not engage in alcohol and/or illegal drug use while on prescription medicines.  In the event of worsening symptoms, patient is instructed to call the crisis hotline, 911 and or go to the nearest ED for appropriate evaluation and treatment of symptoms.  Follow-up with primary care provider for further care of medical issues, concerns and or health care needs. Follow up with primary care for further evaluation and treatment for cholesterol and blood pressure. Follow up with pain clinic for pain management.   Signed: Roselle Locus, MD 03/09/2022, 9:09 AM

## 2022-03-09 NOTE — Progress Notes (Signed)
Pt was educated on discharge. Pt was given discharge papers. Pt was discharged to lobby. 

## 2022-03-09 NOTE — [facility]HH Suicide Risk Assessment (Signed)
[facility] Discharge Suicide Risk Assessment   Principal Problem: Bipolar affective disorder, currently depressed, moderate (HCC) Discharge Diagnoses: Principal Problem:   Bipolar affective disorder, currently depressed, moderate (HCC) Active Problems:   Social phobia   Tobacco use   GAD (generalized anxiety disorder)   Marijuana use   Bipolar depression (HCC)   Total Time spent with patient: 20 minutes  Musculoskeletal: Strength & Muscle Tone: within normal limits Gait & Station:  antalgic Patient leans: Left  Psychiatric Specialty Exam  Presentation  General Appearance: Appropriate for Environment  Eye Contact:Fair  Speech:Normal Rate  Speech Volume:Normal  Handedness:Right   Mood and Affect  Mood:Anxious  Duration of Depression Symptoms: No data recorded Affect:Constricted   Thought Process  Thought Processes:Linear  Descriptions of Associations:Intact  Orientation:Full (Time, Place and Person)  Thought Content:Logical  History of Schizophrenia/Schizoaffective disorder:No data recorded Duration of Psychotic Symptoms:No data recorded Hallucinations:Hallucinations: None  Ideas of Reference:None  Suicidal Thoughts:Suicidal Thoughts: No  Homicidal Thoughts:Homicidal Thoughts: No   Sensorium  Memory:Immediate Good; Recent Fair; Remote Fair  Judgment:Fair  Insight:Fair   Executive Functions  Concentration:Fair  Attention Span:Fair  Recall:Fair  Fund of Knowledge:Fair  Language:Fair   Psychomotor Activity  Psychomotor Activity:Psychomotor Activity: Normal   Assets  Assets:Desire for Improvement; Communication Skills   Sleep  Sleep:Sleep: Poor   Physical Exam: Physical Exam Vitals and nursing note reviewed.  Constitutional:      Appearance: Normal appearance. He is obese.  HENT:     Head: Normocephalic and atraumatic.     Nose: Nose normal.  Eyes:     Extraocular Movements: Extraocular movements intact.  Pulmonary:      Effort: Pulmonary effort is normal.  Musculoskeletal:        General: Normal range of motion.     Cervical back: Normal range of motion.  Neurological:     General: No focal deficit present.     Mental Status: He is alert and oriented to person, place, and time.  Psychiatric:        Mood and Affect: Mood normal.        Behavior: Behavior normal.        Thought Content: Thought content normal.        Judgment: Judgment normal.    Review of Systems  Constitutional:  Negative for chills and fever.  HENT:  Negative for hearing loss.   Eyes:  Negative for blurred vision.  Respiratory:  Negative for cough.   Genitourinary:  Negative for dysuria.  Musculoskeletal:  Positive for back pain, joint pain and myalgias.  Skin:  Negative for rash.  Neurological:  Negative for dizziness and headaches.  Psychiatric/Behavioral:  Negative for hallucinations and suicidal ideas. The patient is not nervous/anxious and does not have insomnia.    Blood pressure (!) 163/120, pulse (!) 122, temperature 98.6 F (37 C), temperature source Oral, resp. rate 20, height 5\' 3"  (1.6 m), weight 122.5 kg, SpO2 98 %. Body mass index is 47.83 kg/m.  Mental Status Per Nursing Assessment::   On Admission:  NA  Demographic Factors:  Caucasian and Gay, lesbian, or bisexual orientation  Loss Factors: NA  Historical Factors: Family history of mental illness or substance abuse and Impulsivity  Risk Reduction Factors:   Positive coping skills or problem solving skills  Continued Clinical Symptoms:  Chronic Pain  Cognitive Features That Contribute To Risk:  None    Suicide Risk:  Minimal: No identifiable suicidal ideation.  Patients presenting with no risk factors but with morbid ruminations; may  be classified as minimal risk based on the severity of the depressive symptoms   Follow-up Information     [facility]. Go on 03/14/2022.   Why: You have a hospital follow up appointment for  medication management services on  03/14/22 at 9:00 am.   This appointment will be held in person. Contact information: [address] [place] [address] [phone]         [facility] Follow up on 03/16/2022.   Specialty: Behavioral Health Why: You have an appointment on 03/16/22 at 10:00 am for therapy services. This appointment will be Virtual telehealth. Contact information: [address] [place] [address] [place] [address] [phone]                 Plan Of Care/Follow-up recommendations:  Activity:  as tolerated Diet:  low sodium, low fat Prescriptions for new medications provided for the patient to bridge to follow up appointment. The patient was informed that refills for these prescriptions are generally not provided, and patient is encouraged to attend all follow up appointments to address medication refills and adjustments.   Today's discharge was reviewed with treatment team, and the team is in agreement that the patient is ready for discharge. The patient is was of the discharge plan for today and has been given opportunity to ask questions. At time of discharge, the patient does not vocalize any acute harm to self or others, is goal directed, able to advocate for self and organizational baseline.   At discharge, the patient is instructed to:  Take all medications as prescribed. Report any adverse effects and or reactions from the medicines to her outpatient provider promptly.  Do not engage in alcohol and/or illegal drug use while on prescription medicines.  In the event of worsening symptoms, patient is instructed to call the crisis hotline, 911 and or go to the nearest ED for appropriate evaluation and treatment of symptoms.  Follow-up with primary care provider for further care of medical issues, concerns and or health care needs. Follow up with primary care for further evaluation and treatment for cholesterol and blood pressure. Follow up  with pain clinic for pain management.  Roselle Locus, MD 03/09/2022, 9:08 AM

## 2022-03-10 DIAGNOSIS — M47816 Spondylosis without myelopathy or radiculopathy, lumbar region: Secondary | ICD-10-CM | POA: Diagnosis not present

## 2022-03-10 DIAGNOSIS — M7918 Myalgia, other site: Secondary | ICD-10-CM | POA: Diagnosis not present

## 2022-03-10 DIAGNOSIS — M5136 Other intervertebral disc degeneration, lumbar region: Secondary | ICD-10-CM | POA: Diagnosis not present

## 2022-03-10 LAB — VITAMIN B1: Vitamin B1 (Thiamine): 112.6 nmol/L (ref 66.5–200.0)

## 2022-03-13 DIAGNOSIS — M51369 Other intervertebral disc degeneration, lumbar region without mention of lumbar back pain or lower extremity pain: Secondary | ICD-10-CM | POA: Insufficient documentation

## 2022-03-13 DIAGNOSIS — M5136 Other intervertebral disc degeneration, lumbar region: Secondary | ICD-10-CM | POA: Insufficient documentation

## 2022-03-14 ENCOUNTER — Ambulatory Visit: Payer: Medicaid Other | Admitting: Family Medicine

## 2022-03-14 DIAGNOSIS — F329 Major depressive disorder, single episode, unspecified: Secondary | ICD-10-CM | POA: Diagnosis not present

## 2022-03-15 ENCOUNTER — Ambulatory Visit: Payer: Medicaid Other | Admitting: Family Medicine

## 2022-03-16 DIAGNOSIS — F411 Generalized anxiety disorder: Secondary | ICD-10-CM | POA: Diagnosis not present

## 2022-03-21 ENCOUNTER — Ambulatory Visit: Payer: Medicaid Other | Admitting: Family Medicine

## 2022-03-21 ENCOUNTER — Other Ambulatory Visit: Payer: Self-pay | Admitting: Licensed Clinical Social Worker

## 2022-03-21 DIAGNOSIS — F329 Major depressive disorder, single episode, unspecified: Secondary | ICD-10-CM | POA: Diagnosis not present

## 2022-03-21 NOTE — Patient Outreach (Addendum)
Medicaid Managed Care Social Work Note  03/21/2022 Name:  [name] MRN:  [id number] DOB:  27-Mar-1991  [name] is an 31 y.o. year old adult who is a primary patient of Rakes, Doralee Albino, FNP.  The Medicaid Managed Care Coordination team was consulted for assistance with:  Mental Health Counseling and Resources  [name] was given information about Medicaid Managed Care Coordination team services today. [name]  Patient reports no longer needing [facility] services as she has successfully been connected with a long term mental health provider at [facility]  Patient is agreeable to connect this [facility] LCSW directly, if any future case management needs arise. Patient denies any current crises at this time. Patient was extremely thankful for this call.   Engaged with patient  for by telephone forinitial visit in response to referral for case management and/or care coordination services   Assessments/Interventions:  Review of past medical history, allergies, medications, health status, including review of consultants reports, laboratory and other test data, was performed as part of comprehensive evaluation and provision of chronic care management services.  SDOH Screenings   Housing: Low Risk  (05/18/2022)  Transportation Needs: No Transportation Needs (03/21/2022)  Alcohol Screen: Low Risk  (03/06/2022)  Depression (PHQ2-9): Medium Risk (04/25/2022)  Stress: Stress Concern Present (03/21/2022)  Tobacco Use: Medium Risk (05/15/2022)      Care Plan                 Allergies  Allergen Reactions   Strawberry Extract Itching and Rash    Medications Reviewed Today     Reviewed by Gordan Payment, RN (Registered Nurse) on 03/06/22 at 2318  Med List Status: <None>   Medication Order Taking? Sig Documenting Provider Last Dose Status Informant  ARIPiprazole (ABILIFY) 10 MG tablet [id number]  Take 1 tablet (10 mg total) by mouth daily. Sonny Masters, FNP  Active Self  ibuprofen  (ADVIL) 200 MG tablet [id number]  Take 800 mg by mouth every 6 (six) hours as needed (For leg pain). [provider]  Active Self  levothyroxine (SYNTHROID) 25 MCG tablet [id number]  Take 1 tablet (25 mcg total) by mouth daily. Sonny Masters, FNP  Active Self  nicotine (NICODERM CQ - DOSED IN MG/24 HOURS) 21 mg/24hr patch [id number]  Place 1 patch (21 mg total) onto the skin daily. Carlyn Reichert, MD  Active   omeprazole (PRILOSEC) 40 MG capsule [id number]  Take 1 capsule (40 mg total) by mouth daily. Sonny Masters, FNP  Active Self  rosuvastatin (CRESTOR) 5 MG tablet [id number]  Take 1 tablet (5 mg total) by mouth daily.  Patient taking differently: Take 5 mg by mouth at bedtime.   Burnadette Pop, MD  Active Self  testosterone cypionate (DEPOTESTOSTERONE CYPIONATE) 200 MG/ML injection [id number]  Inject 40 mg into the muscle every Wednesday. [provider]  Active Self  traZODone (DESYREL) 50 MG tablet [id number]  Take 1 tablet (50 mg total) by mouth at bedtime as needed for sleep. Carlyn Reichert, MD  Active   Vitamin D, Ergocalciferol, (DRISDOL) 1.25 MG (50000 UNIT) CAPS capsule [id number]  Take 1 capsule (50,000 Units total) by mouth every 7 (seven) days.  Patient taking differently: Take 50,000 Units by mouth every Friday.   Sonny Masters, FNP  Active Self            Patient Active Problem List   Diagnosis Date Noted   Bipolar depression (HCC) 03/06/2022   Chronic bilateral  low back pain with right-sided sciatica 02/15/2022   Vitamin D deficiency 01/04/2022   Loud snoring 01/04/2022   GAD (generalized anxiety disorder) 01/04/2022   Marijuana use 01/04/2022   Chest pain 12/25/2021   Transaminitis 12/25/2021   GERD without esophagitis 12/25/2021   Elevated lipase 12/25/2021   Compulsive skin picking 07/28/2020   Social phobia 07/28/2020   Bipolar I disorder, current or most recent episode depressed, in partial remission (HCC) 07/19/2020   Bilateral primary  osteoarthritis of hip 07/19/2020   Allergic rhinitis due to pollen 05/12/2020   MDD (major depressive disorder), recurrent episode, severe (HCC) 05/02/2019   High risk medication use 05/03/2017   Tobacco use 05/29/2016   Morbid obesity (HCC) 11/05/2014   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   Follow up:  Patient requests no follow-up at this time.  Plan: The Managed Medicaid care management team is available to follow up with the patient after provider conversation with the patient regarding recommendation for care management engagement and subsequent re-referral to the care management team.   Dickie La, BSW, MSW, LCSW Managed Medicaid LCSW [facility]  [facility] [email or web address] Phone: [phone]

## 2022-03-21 NOTE — Patient Instructions (Signed)
Visit Information  Mr. [name] was given information about Medicaid Managed Care team care coordination services as a part of their [facility] Medicaid benefit. [name] did not consent to engagement with the [facility] Managed Care team.   If you are experiencing a medical emergency, please call 911 or report to your local emergency department or urgent care.   If you have a non-emergency medical problem during routine business hours, please contact your provider's office and ask to speak with a nurse.   For questions related to your [facility] health plan, please call: [phone] or go here:[email or web address]  If you would like to schedule transportation through your [facility] plan, please call the following number at least 2 days in advance of your appointment: [phone].  You can also use the MTM portal or MTM mobile app to manage your rides. For the portal, please go to [email or web address]  Call the [facility] Crisis Line at [phone], at any time, 24 hours a day, 7 days a week. If you are in danger or need immediate medical attention call 911.  If you would like help to quit smoking, call 1-800-QUIT-NOW ((541)171-6989) OR Espaol: 1-855-Djelo-Ya (8-099-833-8250) o para ms informacin haga clic aqu or Text READY to 539-767 to register via text   The Managed Medicaid care management team is available to follow up with the patient after provider conversation with the patient regarding recommendation for care management engagement and subsequent re-referral to the care management team.   [name], BSW, MSW, LCSW Managed Medicaid LCSW [facility]  [facility] [name] Phone: [phone]

## 2022-03-22 ENCOUNTER — Ambulatory Visit (INDEPENDENT_AMBULATORY_CARE_PROVIDER_SITE_OTHER): Payer: Medicaid Other | Admitting: Family Medicine

## 2022-03-22 ENCOUNTER — Encounter: Payer: Self-pay | Admitting: Family Medicine

## 2022-03-22 VITALS — BP 131/85 | HR 102 | Temp 98.4°F | Ht 63.0 in | Wt 272.0 lb

## 2022-03-22 DIAGNOSIS — M0820 Juvenile rheumatoid arthritis with systemic onset, unspecified site: Secondary | ICD-10-CM | POA: Diagnosis not present

## 2022-03-22 DIAGNOSIS — G43909 Migraine, unspecified, not intractable, without status migrainosus: Secondary | ICD-10-CM | POA: Diagnosis not present

## 2022-03-22 DIAGNOSIS — G43019 Migraine without aura, intractable, without status migrainosus: Secondary | ICD-10-CM | POA: Diagnosis not present

## 2022-03-22 DIAGNOSIS — G44209 Tension-type headache, unspecified, not intractable: Secondary | ICD-10-CM | POA: Diagnosis not present

## 2022-03-22 DIAGNOSIS — I10 Essential (primary) hypertension: Secondary | ICD-10-CM | POA: Diagnosis not present

## 2022-03-22 DIAGNOSIS — R030 Elevated blood-pressure reading, without diagnosis of hypertension: Secondary | ICD-10-CM

## 2022-03-22 DIAGNOSIS — F319 Bipolar disorder, unspecified: Secondary | ICD-10-CM | POA: Diagnosis not present

## 2022-03-22 MED ORDER — PROPRANOLOL HCL 40 MG PO TABS: 40.0000 mg | ORAL_TABLET | Freq: Every day | ORAL | 3 refills | Status: DC

## 2022-03-22 NOTE — Progress Notes (Signed)
Subjective:  Patient ID: [name], adult    DOB: 1991/01/15, 31 y.o.   MRN: [id number]  Patient Care Team: Baruch Gouty, FNP as PCP - General (Family Medicine) Harl Bowie Alphonse Guild, MD as PCP - Cardiology (Cardiology)   Chief Complaint:  Hypertension (Pt states he is checking it as soon as he wakes up in morning , it has gotten worse - been going on for about 2 weeks ) and Headache (Been going on for about 2 days now )   HPI: [name] is a 31 y.o. adult presenting on 03/22/2022 for Hypertension (Pt states he is checking it as soon as he wakes up in morning , it has gotten worse - been going on for about 2 weeks ) and Headache (Been going on for about 2 days now )   Pt presents today with complaints of fluctuating blood pressure readings and worsening migraine headaches. Was on Topamax in the past for migraine prevention but has been off for a while. States headaches returned a few weeks ago and do not seem to be well controlled. States BP has been in the 170 range at home.   Hypertension This is a new problem. The current episode started in the past 7 days. The problem has been waxing and waning since onset. Associated symptoms include anxiety and headaches. Pertinent negatives include no blurred vision, chest pain, malaise/fatigue, neck pain, orthopnea, palpitations, peripheral edema, PND, shortness of breath or sweats. Risk factors for coronary artery disease include obesity. Past treatments include nothing. Compliance problems include exercise and diet.   Headache  This is a recurrent problem. The current episode started in the past 7 days. The problem occurs intermittently. The problem has been waxing and waning. The pain is located in the Temporal region. The pain does not radiate. The pain quality is similar to prior headaches. The quality of the pain is described as squeezing and throbbing. The pain is moderate. Associated symptoms include photophobia. Pertinent negatives  include no abdominal pain, abnormal behavior, anorexia, back pain, blurred vision, coughing, dizziness, drainage, ear pain, eye pain, eye redness, eye watering, facial sweating, fever, hearing loss, insomnia, loss of balance, muscle aches, nausea, neck pain, numbness, phonophobia, rhinorrhea, scalp tenderness, seizures, sinus pressure, sore throat, swollen glands, tingling, tinnitus, visual change, vomiting, weakness or weight loss. The symptoms are aggravated by bright light, noise and emotional stress. He has tried acetaminophen and darkened room for the symptoms. The treatment provided no relief. His past medical history is significant for hypertension.       Relevant past medical, surgical, family, and social history reviewed and updated as indicated.  Allergies and medications reviewed and updated. Data reviewed: Chart in Epic.   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[place])    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[place])    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[place])    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[place])     Past Surgical History:  Procedure Laterality Date   CHOLECYSTECTOMY N/A 08/29/2021   Procedure: LAPAROSCOPIC CHOLECYSTECTOMY WITH ICG;  Surgeon: Ileana Roup, MD;  Location: [facility] ORS;  Service: General;  Laterality: N/A;   WISDOM TOOTH EXTRACTION      Social History   Socioeconomic History  Marital status: Significant Other    Spouse name: Not on file   Number of children: Not on file   Years of education: Not on file   Highest education level: Not on file  Occupational History   Not on file  Tobacco Use   Smoking status: Former    Packs/day: 1.00    Years: 5.00    Total pack years: 5.00    Types: Cigarettes, E-cigarettes    Start date: 07/27/2009    Quit date: 12/2019    Years since  quitting: 2.2   Smokeless tobacco: Never  Vaping Use   Vaping Use: Every day   Substances: Nicotine, Flavoring  Substance and Sexual Activity   Alcohol use: Not Currently   Drug use: Not Currently    Types: Marijuana    Comment: cannabis use d/o, 2 weeks of sobriety   Sexual activity: Yes    Partners: Female    Birth control/protection: None  Other Topics Concern   Not on file  Social History Narrative   Not on file   Social Determinants of Health   Financial Resource Strain: Not on file  Food Insecurity: Not on file  Transportation Needs: Not on file  Physical Activity: Not on file  Stress: Not on file  Social Connections: Not on file  Intimate Partner Violence: Not on file    Outpatient Encounter Medications as of 03/22/2022  Medication Sig   amitriptyline (ELAVIL) 25 MG tablet Take 1 tablet (25 mg total) by mouth at bedtime.   ARIPiprazole (ABILIFY) 10 MG tablet Take 1 tablet (10 mg total) by mouth daily.   hydrOXYzine (ATARAX) 25 MG tablet Take 1 tablet (25 mg total) by mouth 3 (three) times daily as needed for anxiety.   ibuprofen (ADVIL) 200 MG tablet Take 800 mg by mouth every 6 (six) hours as needed (For leg pain).   levothyroxine (SYNTHROID) 25 MCG tablet Take 1 tablet (25 mcg total) by mouth daily.   omeprazole (PRILOSEC) 40 MG capsule Take 1 capsule (40 mg total) by mouth daily.   propranolol (INDERAL) 40 MG tablet Take 1 tablet (40 mg total) by mouth at bedtime.   rosuvastatin (CRESTOR) 5 MG tablet Take 1 tablet (5 mg total) by mouth daily. (Patient taking differently: Take 5 mg by mouth at bedtime.)   testosterone cypionate (DEPOTESTOSTERONE CYPIONATE) 200 MG/ML injection Inject 40 mg into the muscle every Wednesday.   Vitamin D, Ergocalciferol, (DRISDOL) 1.25 MG (50000 UNIT) CAPS capsule Take 1 capsule (50,000 Units total) by mouth every 7 (seven) days.   gabapentin (NEURONTIN) 300 MG capsule Take 300 mg by mouth 3 (three) times daily.   No  facility-administered encounter medications on file as of 03/22/2022.    Allergies  Allergen Reactions   Strawberry Extract Itching and Rash    Review of Systems  Constitutional:  Positive for activity change, appetite change and fatigue. Negative for chills, diaphoresis, fever, malaise/fatigue, unexpected weight change and weight loss.  HENT:  Negative for congestion, ear pain, hearing loss, rhinorrhea, sinus pressure, sore throat and tinnitus.   Eyes:  Positive for photophobia. Negative for blurred vision, pain, redness and visual disturbance.  Respiratory:  Negative for cough and shortness of breath.   Cardiovascular:  Negative for chest pain, palpitations, orthopnea and PND.  Gastrointestinal:  Negative for abdominal pain, anorexia, nausea and vomiting.  Genitourinary:  Negative for decreased urine volume and difficulty urinating.  Musculoskeletal:  Negative for back pain and neck pain.  Neurological:  Positive for headaches.  Negative for dizziness, tingling, tremors, seizures, syncope, facial asymmetry, speech difficulty, weakness, light-headedness, numbness and loss of balance.  Psychiatric/Behavioral:  Negative for confusion. The patient does not have insomnia.   All other systems reviewed and are negative.       Objective:  BP 131/85   Pulse (!) 102   Temp 98.4 F (36.9 C)   Ht 5\' 3"  (1.6 m)   Wt 272 lb (123.4 kg)   SpO2 96%   BMI 48.18 kg/m    Wt Readings from Last 3 Encounters:  03/22/22 272 lb (123.4 kg)  03/06/22 270 lb (122.5 kg)  03/01/22 273 lb (123.8 kg)    Physical Exam Vitals and nursing note reviewed.  Constitutional:      General: He is not in acute distress.    Appearance: He is well-developed. He is obese. He is not ill-appearing, toxic-appearing or diaphoretic.  HENT:     Head: Normocephalic and atraumatic.     Mouth/Throat:     Mouth: Mucous membranes are moist.  Eyes:     General: No visual field deficit.    Extraocular Movements:  Extraocular movements intact.     Pupils: Pupils are equal, round, and reactive to light.  Cardiovascular:     Rate and Rhythm: Normal rate and regular rhythm.     Heart sounds: Normal heart sounds.  Pulmonary:     Effort: Pulmonary effort is normal.     Breath sounds: Normal breath sounds.  Musculoskeletal:     Cervical back: Normal range of motion and neck supple.     Right lower leg: No edema.     Left lower leg: No edema.  Skin:    General: Skin is warm and dry.     Capillary Refill: Capillary refill takes less than 2 seconds.  Neurological:     General: No focal deficit present.     Mental Status: He is alert and oriented to person, place, and time.     GCS: GCS eye subscore is 4. GCS verbal subscore is 5. GCS motor subscore is 6.     Cranial Nerves: No cranial nerve deficit, dysarthria or facial asymmetry.     Sensory: No sensory deficit.     Motor: No weakness.     Coordination: Romberg sign negative. Coordination normal.     Gait: Gait normal.  Psychiatric:        Mood and Affect: Mood normal.        Speech: Speech normal.        Behavior: Behavior normal.     Results for orders placed or performed during the hospital encounter of 03/06/22  Urinalysis, Complete w Microscopic Urine, Clean Catch  Result Value Ref Range   Color, Urine YELLOW YELLOW   APPearance CLOUDY (A) CLEAR   Specific Gravity, Urine 1.012 1.005 - 1.030   pH 5.0 5.0 - 8.0   Glucose, UA NEGATIVE NEGATIVE mg/dL   Hgb urine dipstick SMALL (A) NEGATIVE   Bilirubin Urine NEGATIVE NEGATIVE   Ketones, ur NEGATIVE NEGATIVE mg/dL   Protein, ur NEGATIVE NEGATIVE mg/dL   Nitrite NEGATIVE NEGATIVE   Leukocytes,Ua NEGATIVE NEGATIVE   RBC / HPF 0-5 0 - 5 RBC/hpf   WBC, UA 0-5 0 - 5 WBC/hpf   Bacteria, UA RARE (A) NONE SEEN   Squamous Epithelial / LPF 21-50 0 - 5   Mucus PRESENT   Magnesium  Result Value Ref Range   Magnesium 2.4 1.7 - 2.4 mg/dL  Vitamin 05/06/22  Result  Value Ref Range   Vitamin B-12 361  180 - 914 pg/mL  Vitamin B1  Result Value Ref Range   Vitamin B1 (Thiamine) 112.6 66.5 - 200.0 nmol/L       Pertinent labs & imaging results that were available during my care of the patient were reviewed by me and considered in my medical decision making.  Assessment & Plan:  Aditi was seen today for hypertension and headache.  Diagnoses and all orders for this visit:  Intractable migraine without aura and without status migrainosus Elevated blood-pressure reading, without diagnosis of hypertension BP acceptable in office today. Has been on preventative Topamax in the past for Migraines, has not taken in several months. Will trial preventative inderal as prescribed as this will be beneficial for HR, BP, headache prevention, and anxiety. Headache and BP log provided to pt today. Will follow up next month for reevaluation  -     propranolol (INDERAL) 40 MG tablet; Take 1 tablet (40 mg total) by mouth at bedtime.     Continue all other maintenance medications.  Follow up plan: Return if symptoms worsen or fail to improve.   Continue healthy lifestyle choices, including diet (rich in fruits, vegetables, and lean proteins, and low in salt and simple carbohydrates) and exercise (at least 30 minutes of moderate physical activity daily).  Educational handout given for migraine, headache diary  The above assessment and management plan was discussed with the patient. The patient verbalized understanding of and has agreed to the management plan. Patient is aware to call the clinic if they develop any new symptoms or if symptoms persist or worsen. Patient is aware when to return to the clinic for a follow-up visit. Patient educated on when it is appropriate to go to the emergency department.   Kari Baars, FNP-C [facility] [phone]

## 2022-03-29 DIAGNOSIS — F411 Generalized anxiety disorder: Secondary | ICD-10-CM | POA: Diagnosis not present

## 2022-03-31 DIAGNOSIS — Z419 Encounter for procedure for purposes other than remedying health state, unspecified: Secondary | ICD-10-CM | POA: Diagnosis not present

## 2022-04-03 DIAGNOSIS — F411 Generalized anxiety disorder: Secondary | ICD-10-CM | POA: Diagnosis not present

## 2022-04-04 ENCOUNTER — Telehealth: Payer: Self-pay | Admitting: Neurology

## 2022-04-04 NOTE — Telephone Encounter (Signed)
[facility] pending uploaded notes on the portal  

## 2022-04-10 NOTE — Telephone Encounter (Signed)
Checked status on the portal it is still pending.  

## 2022-04-18 ENCOUNTER — Ambulatory Visit: Payer: Medicaid Other | Admitting: Family Medicine

## 2022-04-18 DIAGNOSIS — F411 Generalized anxiety disorder: Secondary | ICD-10-CM | POA: Diagnosis not present

## 2022-04-18 NOTE — Telephone Encounter (Signed)
NPSG- MCD Leanne Chang: [id number] (exp. 04/04/22 to 07/09/22).  Patient is scheduled at [facility] for 05/07/22 at 8 pm.  Mailed packet to the patient.

## 2022-04-21 ENCOUNTER — Ambulatory Visit: Payer: Medicaid Other | Admitting: Family Medicine

## 2022-04-21 ENCOUNTER — Encounter: Payer: Self-pay | Admitting: Family Medicine

## 2022-04-24 DIAGNOSIS — M25551 Pain in right hip: Secondary | ICD-10-CM | POA: Diagnosis not present

## 2022-04-24 DIAGNOSIS — Z6841 Body Mass Index (BMI) 40.0 and over, adult: Secondary | ICD-10-CM | POA: Diagnosis not present

## 2022-04-24 DIAGNOSIS — Z0000 Encounter for general adult medical examination without abnormal findings: Secondary | ICD-10-CM | POA: Diagnosis not present

## 2022-04-24 DIAGNOSIS — M25552 Pain in left hip: Secondary | ICD-10-CM | POA: Diagnosis not present

## 2022-04-24 DIAGNOSIS — Z789 Other specified health status: Secondary | ICD-10-CM | POA: Diagnosis not present

## 2022-04-25 ENCOUNTER — Encounter: Payer: Self-pay | Admitting: Family Medicine

## 2022-04-25 ENCOUNTER — Ambulatory Visit (INDEPENDENT_AMBULATORY_CARE_PROVIDER_SITE_OTHER): Payer: Medicaid Other | Admitting: Family Medicine

## 2022-04-25 VITALS — BP 120/84 | HR 106 | Temp 98.0°F | Ht 63.0 in | Wt 281.0 lb

## 2022-04-25 DIAGNOSIS — Z23 Encounter for immunization: Secondary | ICD-10-CM

## 2022-04-25 DIAGNOSIS — E782 Mixed hyperlipidemia: Secondary | ICD-10-CM | POA: Diagnosis not present

## 2022-04-25 DIAGNOSIS — F411 Generalized anxiety disorder: Secondary | ICD-10-CM | POA: Diagnosis not present

## 2022-04-25 DIAGNOSIS — E8881 Metabolic syndrome: Secondary | ICD-10-CM | POA: Diagnosis not present

## 2022-04-25 DIAGNOSIS — E559 Vitamin D deficiency, unspecified: Secondary | ICD-10-CM | POA: Diagnosis not present

## 2022-04-25 DIAGNOSIS — E039 Hypothyroidism, unspecified: Secondary | ICD-10-CM | POA: Diagnosis not present

## 2022-04-25 MED ORDER — METFORMIN HCL 500 MG PO TABS: 500.0000 mg | ORAL_TABLET | Freq: Two times a day (BID) | ORAL | 3 refills | Status: DC

## 2022-04-25 NOTE — Progress Notes (Signed)
Subjective:  Patient ID: [name], adult    DOB: 10-23-1990, 31 y.o.   MRN: [id number]  Patient Care Team: Baruch Gouty, FNP as PCP - General (Family Medicine) Harl Bowie Alphonse Guild, MD as PCP - Cardiology (Cardiology)   Chief Complaint:  Medical Management of Chronic Issues (thyroid) and 2 month follow up thyroid   HPI: [name] is a 31 y.o. adult presenting on 04/25/2022 for Medical Management of Chronic Issues (thyroid) and 2 month follow up thyroid    1. Acquired hypothyroidism Compliant with medications - Yes Current medications - synthroid 25 mcg Adverse side effects - No Weight - gaining  Bowel habit changes - No Heat or cold intolerance - No Mood changes - No Changes in sleep habits - Yes Fatigue - Yes Skin, hair, or nail changes - No Tremor - No Palpitations - No Edema - No Shortness of breath - No  2. Metabolic syndrome 4. Morbid obesity (HC) Has gained weight since last visit. Has been trying to eat healthier but does not exercise on a regular basis. No longer on testosterone therapy. Is compliant with thyroid medications.   3. Vitamin D deficiency Pt is taking oral repletion therapy. Denies bone pain and tenderness, muscle weakness, fracture, and difficulty walking. Lab Results  Component Value Date   VD25OH 10.0 (L) 01/04/2022   Lab Results  Component Value Date   CALCIUM 9.1 03/06/2022     5. Mixed hyperlipidemia Compliant with medications - Yes Current medications - crestor Side effects from medications - No Diet - fairly healthy Exercise - not regular    Relevant past medical, surgical, family, and social history reviewed and updated as indicated.  Allergies and medications reviewed and updated. Data reviewed: Chart in Epic.   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[place])     Past Surgical History:  Procedure Laterality Date   CHOLECYSTECTOMY N/A 08/29/2021   Procedure: LAPAROSCOPIC CHOLECYSTECTOMY WITH ICG;  Surgeon: Ileana Roup, MD;  Location: [facility];  Service: General;  Laterality: N/A;   WISDOM TOOTH EXTRACTION      Social History   Socioeconomic History   Marital status: Significant Other    Spouse name: Not on file   Number of children: Not on file   Years of education: Not on file   Highest education level: Not on file  Occupational History   Not on file  Tobacco Use   Smoking status: Former    Packs/day: 1.00    Years: 5.00    Total pack years: 5.00    Types: Cigarettes, E-cigarettes    Start date: 07/27/2009    Quit date: 12/2019    Years since quitting: 2.3   Smokeless tobacco: Never  Vaping Use   Vaping Use: Every day   Substances: Nicotine, Flavoring  Substance and Sexual Activity   Alcohol use: Not Currently   Drug use: Not Currently    Types: Marijuana    Comment: cannabis use d/o, 2 weeks of sobriety   Sexual activity: Yes    Partners: Female    Birth  control/protection: None  Other Topics Concern   Not on file  Social History Narrative   Not on file   Social Determinants of Health   Financial Resource Strain: Not on file  Food Insecurity: Not on file  Transportation Needs: Not on file  Physical Activity: Not on file  Stress: Not on file  Social Connections: Not on file  Intimate Partner Violence: Not on file    Outpatient Encounter Medications as of 04/25/2022  Medication Sig   amitriptyline (ELAVIL) 25 MG tablet Take 1 tablet (25 mg total) by mouth at bedtime.   ARIPiprazole (ABILIFY) 10 MG tablet Take 1 tablet (10 mg total) by mouth daily.   gabapentin (NEURONTIN) 300 MG capsule Take 300 mg by mouth 3 (three) times daily.   gabapentin  (NEURONTIN) 300 MG capsule Take by mouth.   hydrOXYzine (ATARAX) 25 MG tablet Take 1 tablet (25 mg total) by mouth 3 (three) times daily as needed for anxiety.   IBU 600 MG tablet Take 600 mg by mouth every 6 (six) hours as needed.   ibuprofen (ADVIL) 200 MG tablet Take 800 mg by mouth every 6 (six) hours as needed (For leg pain).   levothyroxine (SYNTHROID) 25 MCG tablet Take 1 tablet (25 mcg total) by mouth daily.   metFORMIN (GLUCOPHAGE) 500 MG tablet Take 1 tablet (500 mg total) by mouth 2 (two) times daily with a meal.   omeprazole (PRILOSEC) 40 MG capsule Take 1 capsule (40 mg total) by mouth daily.   ondansetron (ZOFRAN) 8 MG tablet Take by mouth.   promethazine (PHENERGAN) 25 MG tablet Take 25 mg by mouth every 6 (six) hours as needed.   rosuvastatin (CRESTOR) 5 MG tablet Take 1 tablet (5 mg total) by mouth daily. (Patient taking differently: Take 5 mg by mouth at bedtime.)   Vitamin D, Ergocalciferol, (DRISDOL) 1.25 MG (50000 UNIT) CAPS capsule Take 1 capsule (50,000 Units total) by mouth every 7 (seven) days.   propranolol (INDERAL) 40 MG tablet Take 1 tablet (40 mg total) by mouth at bedtime. (Patient not taking: Reported on 04/25/2022)   [DISCONTINUED] testosterone cypionate (DEPOTESTOSTERONE CYPIONATE) 200 MG/ML injection Inject 40 mg into the muscle every Wednesday. (Patient not taking: Reported on 04/25/2022)   No facility-administered encounter medications on file as of 04/25/2022.    Allergies  Allergen Reactions   Strawberry Extract Itching and Rash    Review of Systems  Constitutional:  Positive for activity change, appetite change and fatigue. Negative for chills, diaphoresis, fever and unexpected weight change.  HENT: Negative.    Eyes: Negative.   Respiratory:  Negative for cough, chest tightness and shortness of breath.   Cardiovascular:  Negative for chest pain, palpitations and leg swelling.  Gastrointestinal:  Negative for abdominal pain, blood in stool,  constipation, diarrhea, nausea and vomiting.  Endocrine: Negative.   Genitourinary:  Negative for decreased urine volume, difficulty urinating, dysuria, frequency and urgency.  Musculoskeletal:  Positive for arthralgias. Negative for back pain, gait problem, joint swelling, myalgias, neck pain and neck stiffness.  Skin: Negative.   Allergic/Immunologic: Negative.   Neurological:  Positive for headaches. Negative for dizziness, tremors, seizures, syncope, facial asymmetry, speech difficulty, weakness, light-headedness and numbness.  Hematological: Negative.   Psychiatric/Behavioral:  Positive for sleep disturbance. Negative for agitation, behavioral problems, confusion, decreased concentration, dysphoric mood, hallucinations, self-injury and suicidal ideas. The patient is nervous/anxious. The patient is not hyperactive.   All other systems reviewed and are negative.       Objective:  BP 120/84   Pulse (!) 106   Temp 98 F (36.7 C)   Ht _0  (1.6 m)   Wt 281 lb (127.5 kg)   SpO2 97%   BMI 49.78 kg/m    Wt Readings from Last 3 Encounters:  04/25/22 281 lb (127.5 kg)  03/22/22 272 lb (123.4 kg)  03/01/22 273 lb (123.8 kg)    Physical Exam Vitals and nursing note reviewed.  Constitutional:      Appearance: Normal appearance. He is morbidly obese.  HENT:     Nose: Nose normal.     Mouth/Throat:     Mouth: Mucous membranes are moist.  Eyes:     Conjunctiva/sclera: Conjunctivae normal.     Pupils: Pupils are equal, round, and reactive to light.  Cardiovascular:     Rate and Rhythm: Regular rhythm. Tachycardia present.     Heart sounds: Normal heart sounds. No murmur heard.    No friction rub. No gallop.  Pulmonary:     Effort: Pulmonary effort is normal.     Breath sounds: Normal breath sounds.  Musculoskeletal:        General: Normal range of motion.     Cervical back: Normal range of motion and neck supple.     Right lower leg: No edema.     Left lower leg: No edema.   Skin:    General: Skin is warm and dry.     Capillary Refill: Capillary refill takes less than 2 seconds.  Neurological:     General: No focal deficit present.     Mental Status: He is alert and oriented to person, place, and time.  Psychiatric:        Mood and Affect: Mood normal.        Behavior: Behavior normal.        Thought Content: Thought content normal.        Judgment: Judgment normal.     Results for orders placed or performed in visit on 01/04/22  CMP14+EGFR  Result Value Ref Range   Glucose 106 (H) 70 - 99 mg/dL   BUN 9 6 - 20 mg/dL   Creatinine, Ser 0.91 0.57 - 1.00 mg/dL   eGFR 86 >59 mL/min/1.73   BUN/Creatinine Ratio 10 9 - 23   Sodium 142 134 - 144 mmol/L   Potassium 4.6 3.5 - 5.2 mmol/L   Chloride 105 96 - 106 mmol/L   CO2 21 20 - 29 mmol/L   Calcium 9.4 8.7 - 10.2 mg/dL   Total Protein 7.6 6.0 - 8.5 g/dL   Albumin 4.5 3.8 - 4.8 g/dL   Globulin, Total 3.1 1.5 - 4.5 g/dL   Albumin/Globulin Ratio 1.5 1.2 - 2.2   Bilirubin Total 0.2 0.0 - 1.2 mg/dL   Alkaline Phosphatase 107 44 - 121 IU/L   AST 27 0 - 40 IU/L   ALT 48 (H) 0 - 32 IU/L  CBC with Differential/Platelet  Result Value Ref Range   WBC 11.4 (H) 3.4 - 10.8 x10E3/uL   RBC 4.76 3.77 - 5.28 x10E6/uL   Hemoglobin 13.7 11.1 - 15.9 g/dL   Hematocrit 42.0 34.0 - 46.6 %   MCV 88 79 - 97 fL   MCH 28.8 26.6 - 33.0 pg   MCHC 32.6 31.5 - 35.7 g/dL   RDW 14.3 11.7 - 15.4 %   Platelets 316 150 - 450 x10E3/uL   Neutrophils 71 Not Estab. %   Lymphs 21 Not Estab. %   Monocytes 5 Not  Estab. %   Eos 3 Not Estab. %   Basos 0 Not Estab. %   Neutrophils Absolute 8.0 (H) 1.4 - 7.0 x10E3/uL   Lymphocytes Absolute 2.4 0.7 - 3.1 x10E3/uL   Monocytes Absolute 0.6 0.1 - 0.9 x10E3/uL   EOS (ABSOLUTE) 0.4 0.0 - 0.4 x10E3/uL   Basophils Absolute 0.1 0.0 - 0.2 x10E3/uL   Immature Granulocytes 0 Not Estab. %   Immature Grans (Abs) 0.0 0.0 - 0.1 x10E3/uL  Lipid panel  Result Value Ref Range   Cholesterol, Total 153  100 - 199 mg/dL   Triglycerides 106 0 - 149 mg/dL   HDL 47 >39 mg/dL   VLDL Cholesterol Cal 19 5 - 40 mg/dL   LDL Chol Calc (NIH) 87 0 - 99 mg/dL   Chol/HDL Ratio 3.3 0.0 - 4.4 ratio  VITAMIN D 25 Hydroxy (Vit-D Deficiency, Fractures)  Result Value Ref Range   Vit D, 25-Hydroxy 10.0 (L) 30.0 - 100.0 ng/mL  Thyroid Panel With TSH  Result Value Ref Range   TSH 7.750 (H) 0.450 - 4.500 uIU/mL   T4, Total 8.8 4.5 - 12.0 ug/dL   T3 Uptake Ratio 20 (L) 24 - 39 %   Free Thyroxine Index 1.8 1.2 - 4.9  Lipase  Result Value Ref Range   Lipase 24 14 - 72 U/L  Amylase  Result Value Ref Range   Amylase 32 31 - 110 U/L  Magnesium  Result Value Ref Range   Magnesium 2.2 1.6 - 2.3 mg/dL  Bayer DCA Hb A1c Waived  Result Value Ref Range   HB A1C (BAYER DCA - WAIVED) 4.9 4.8 - 5.6 %       Pertinent labs & imaging results that were available during my care of the patient were reviewed by me and considered in my medical decision making.  Assessment & Plan:  [name] was seen today for medical management of chronic issues and 2 month follow up thyroid.  Diagnoses and all orders for this visit:  Acquired hypothyroidism Labs pending, will adjust repletion regimen if warranted.  -     Thyroid Panel With TSH  Metabolic syndrome Morbid obesity (HCC) Diet and exercise encouraged. Will start on metformin as BS has been elevated in the past. Follow up in 3 months. Labs pending.  -     CMP14+EGFR -     CBC with Differential/Platelet -     Lipid panel -     Thyroid Panel With TSH -     metFORMIN (GLUCOPHAGE) 500 MG tablet; Take 1 tablet (500 mg total) by mouth 2 (two) times daily with a meal.  Vitamin D deficiency Labs pending. Continue repletion therapy. If indicated, will change repletion dosage. Eat foods rich in Vit D including milk, orange juice, yogurt with vitamin D added, salmon or mackerel, canned tuna fish, cereals with vitamin D added, and cod liver oil. Get out in the sun but make sure  to wear at least SPF 30 sunscreen. -     VITAMIN D 25 Hydroxy (Vit-D Deficiency, Fractures)  Mixed hyperlipidemia Diet encouraged - increase intake of fresh fruits and vegetables, increase intake of lean proteins. Bake, broil, or grill foods. Avoid fried, greasy, and fatty foods. Avoid fast foods. Increase intake of fiber-rich whole grains. Exercise encouraged - at least 150 minutes per week and advance as tolerated. Goal BMI < 25. Continue medications as prescribed. Follow up in 3-6 months as discussed.  -     Lipid panel  Continue all other maintenance medications.  Follow up plan: Return in about 3 months (around 07/25/2022), or if symptoms worsen or fail to improve.   Continue healthy lifestyle choices, including diet (rich in fruits, vegetables, and lean proteins, and low in salt and simple carbohydrates) and exercise (at least 30 minutes of moderate physical activity daily).  Educational handout given for thyroid disease  The above assessment and management plan was discussed with the patient. The patient verbalized understanding of and has agreed to the management plan. Patient is aware to call the clinic if they develop any new symptoms or if symptoms persist or worsen. Patient is aware when to return to the clinic for a follow-up visit. Patient educated on when it is appropriate to go to the emergency department.   Kathryn Pouch, FNP-C [facility] [phone]

## 2022-04-26 LAB — CMP14+EGFR
ALT: 51 IU/L — ABNORMAL HIGH (ref 0–32)
AST: 28 IU/L (ref 0–40)
Albumin/Globulin Ratio: 1.4 (ref 1.2–2.2)
Albumin: 3.9 g/dL (ref 3.9–4.9)
Alkaline Phosphatase: 95 IU/L (ref 44–121)
BUN/Creatinine Ratio: 12 (ref 9–23)
BUN: 11 mg/dL (ref 6–20)
Bilirubin Total: 0.3 mg/dL (ref 0.0–1.2)
CO2: 19 mmol/L — ABNORMAL LOW (ref 20–29)
Calcium: 8.8 mg/dL (ref 8.7–10.2)
Chloride: 104 mmol/L (ref 96–106)
Creatinine, Ser: 0.91 mg/dL (ref 0.57–1.00)
Globulin, Total: 2.7 g/dL (ref 1.5–4.5)
Glucose: 100 mg/dL — ABNORMAL HIGH (ref 70–99)
Potassium: 3.8 mmol/L (ref 3.5–5.2)
Sodium: 138 mmol/L (ref 134–144)
Total Protein: 6.6 g/dL (ref 6.0–8.5)
eGFR: 86 mL/min/{1.73_m2} (ref >59)

## 2022-04-26 LAB — CBC WITH DIFFERENTIAL/PLATELET
Basophils Absolute: 0 10*3/uL (ref 0.0–0.2)
Basos: 0 %
EOS (ABSOLUTE): 0.4 10*3/uL (ref 0.0–0.4)
Eos: 4 %
Hematocrit: 39.7 % (ref 34.0–46.6)
Hemoglobin: 13 g/dL (ref 11.1–15.9)
Immature Grans (Abs): 0 10*3/uL (ref 0.0–0.1)
Immature Granulocytes: 0 %
Lymphocytes Absolute: 2.1 10*3/uL (ref 0.7–3.1)
Lymphs: 23 %
MCH: 28.4 pg (ref 26.6–33.0)
MCHC: 32.7 g/dL (ref 31.5–35.7)
MCV: 87 fL (ref 79–97)
Monocytes Absolute: 0.5 10*3/uL (ref 0.1–0.9)
Monocytes: 5 %
Neutrophils Absolute: 6.3 10*3/uL (ref 1.4–7.0)
Neutrophils: 68 %
Platelets: 306 10*3/uL (ref 150–450)
RBC: 4.57 x10E6/uL (ref 3.77–5.28)
RDW: 13.6 % (ref 11.7–15.4)
WBC: 9.3 10*3/uL (ref 3.4–10.8)

## 2022-04-26 LAB — THYROID PANEL WITH TSH
Free Thyroxine Index: 1.9 (ref 1.2–4.9)
T3 Uptake Ratio: 23 % — ABNORMAL LOW (ref 24–39)
T4, Total: 8.3 ug/dL (ref 4.5–12.0)
TSH: 2.13 u[IU]/mL (ref 0.450–4.500)

## 2022-04-26 LAB — LIPID PANEL
Chol/HDL Ratio: 4.9 ratio — ABNORMAL HIGH (ref 0.0–4.4)
Cholesterol, Total: 172 mg/dL (ref 100–199)
HDL: 35 mg/dL — ABNORMAL LOW (ref >39)
LDL Chol Calc (NIH): 106 mg/dL — ABNORMAL HIGH (ref 0–99)
Triglycerides: 178 mg/dL — ABNORMAL HIGH (ref 0–149)
VLDL Cholesterol Cal: 31 mg/dL (ref 5–40)

## 2022-04-26 LAB — VITAMIN D 25 HYDROXY (VIT D DEFICIENCY, FRACTURES): Vit D, 25-Hydroxy: 17.8 ng/mL — ABNORMAL LOW (ref 30.0–100.0)

## 2022-04-30 DIAGNOSIS — Z419 Encounter for procedure for purposes other than remedying health state, unspecified: Secondary | ICD-10-CM | POA: Diagnosis not present

## 2022-05-03 ENCOUNTER — Other Ambulatory Visit: Payer: Self-pay | Admitting: Family Medicine

## 2022-05-03 ENCOUNTER — Encounter: Payer: Self-pay | Admitting: Family Medicine

## 2022-05-03 DIAGNOSIS — M0800 Unspecified juvenile rheumatoid arthritis of unspecified site: Secondary | ICD-10-CM

## 2022-05-04 ENCOUNTER — Ambulatory Visit: Payer: Medicaid Other

## 2022-05-09 ENCOUNTER — Encounter: Payer: Self-pay | Admitting: Bariatrics

## 2022-05-09 ENCOUNTER — Ambulatory Visit (INDEPENDENT_AMBULATORY_CARE_PROVIDER_SITE_OTHER): Payer: Medicaid Other | Admitting: Bariatrics

## 2022-05-09 VITALS — BP 137/81 | HR 107 | Temp 98.0°F | Ht 63.0 in | Wt 279.0 lb

## 2022-05-09 DIAGNOSIS — E786 Lipoprotein deficiency: Secondary | ICD-10-CM | POA: Diagnosis not present

## 2022-05-09 DIAGNOSIS — E782 Mixed hyperlipidemia: Secondary | ICD-10-CM | POA: Diagnosis not present

## 2022-05-09 DIAGNOSIS — E039 Hypothyroidism, unspecified: Secondary | ICD-10-CM

## 2022-05-09 DIAGNOSIS — E669 Obesity, unspecified: Secondary | ICD-10-CM

## 2022-05-09 DIAGNOSIS — E559 Vitamin D deficiency, unspecified: Secondary | ICD-10-CM | POA: Diagnosis not present

## 2022-05-09 DIAGNOSIS — F509 Eating disorder, unspecified: Secondary | ICD-10-CM | POA: Insufficient documentation

## 2022-05-09 DIAGNOSIS — F411 Generalized anxiety disorder: Secondary | ICD-10-CM | POA: Diagnosis not present

## 2022-05-09 DIAGNOSIS — R7309 Other abnormal glucose: Secondary | ICD-10-CM

## 2022-05-09 DIAGNOSIS — R5383 Other fatigue: Secondary | ICD-10-CM | POA: Diagnosis not present

## 2022-05-09 DIAGNOSIS — Z6841 Body Mass Index (BMI) 40.0 and over, adult: Secondary | ICD-10-CM | POA: Diagnosis not present

## 2022-05-09 DIAGNOSIS — F5089 Other specified eating disorder: Secondary | ICD-10-CM | POA: Diagnosis not present

## 2022-05-10 DIAGNOSIS — Z0289 Encounter for other administrative examinations: Secondary | ICD-10-CM

## 2022-05-15 ENCOUNTER — Ambulatory Visit: Payer: Medicaid Other

## 2022-05-15 ENCOUNTER — Encounter: Payer: Self-pay | Admitting: Bariatrics

## 2022-05-15 DIAGNOSIS — F411 Generalized anxiety disorder: Secondary | ICD-10-CM | POA: Diagnosis not present

## 2022-05-15 NOTE — Progress Notes (Signed)
Chief Complaint:   OBESITY  Today's visit was #: 1 Starting weight: 279 lbs Starting date: 05/09/2022 Today's weight: 279 lbs Today's date: 05/09/2022 Total lbs lost to date: 0 Total lbs lost since last in-office visit: 0  Interim History: [name] is here today for an information session. He was referred by her Orthopedist.   Subjective:   1. Acquired hypothyroidism [name] is taking Synthroid. His last levels were stable per 3-4 years ago.   2. Vitamin D deficiency [name] is taking Vitamin D 50,000 IU weekly. Last Vit D level was 17.6.  3. Mixed hyperlipidemia [name] is taking Crestor currently.   4. Low HDL (under 40) [name]'s last HDL was low at 36, needs to be at least 40.0  5. Elevated glucose [name] is currently taking metformin.   6. Other disorder of eating [name] notes binge eating at night with sweets.   7. Other fatigue [name] notes fatigue and snoring. He has a sleep study coming up on 05/18/2022.  Assessment/Plan:   1. Acquired hypothyroidism [name] will continue Synthroid and gradually increase exercise.   2. Vitamin D deficiency [name] will continue Vitamin D, and we will recheck his level in the future.   3. Mixed hyperlipidemia [name] will continue Crestor, and will eliminate trans fats.   4. Low HDL (under 40) I reviewed lipid panel in depth with the patient today.   5. Elevated glucose [name] will continue metformin and take it with food.   6. Other disorder of eating [name] will consider seeing Dr. Mallie Mussel, our Bariatric Psychologist for evaluation.  7. Other fatigue [name] will continue with his scheduled sleep study, and EKG will be completed at his next visit.  - EKG 12-Lead; Future  8. Obesity, Current BMI 49.4 [name] is currently in the action stage of change. As such, his goal is to continue with weight loss efforts. He has agreed to keeping a food journal and adhering to recommended goals of  1800-1900 calories and 120-140 grams of protein.   [name] will begin journaling (see calories and protein amount).   Exercise goals: No exercise has been prescribed at this time.  Behavioral modification strategies: increasing lean protein intake, decreasing simple carbohydrates, increasing vegetables, increasing water intake, decreasing eating out, no skipping meals, meal planning and cooking strategies, and keeping healthy foods in the home.  [name] has agreed to follow-up with our clinic in 2 weeks. He was informed of the importance of frequent follow-up visits to maximize his success with intensive lifestyle modifications for his multiple health conditions.   Objective:   Blood pressure 137/81, pulse (!) 107, temperature 98 F (36.7 C), height 5\' 3"  (1.6 m), weight 279 lb (126.6 kg), SpO2 97 %. Body mass index is 49.42 kg/m.  General: Cooperative, alert, well developed, in no acute distress. HEENT: Conjunctivae and lids unremarkable. Cardiovascular: Regular rhythm.  Lungs: Normal work of breathing. Neurologic: No focal deficits.   Lab Results  Component Value Date   CREATININE 0.91 04/25/2022   BUN 11 04/25/2022   NA 138 04/25/2022   K 3.8 04/25/2022   CL 104 04/25/2022   CO2 19 (L) 04/25/2022   Lab Results  Component Value Date   ALT 51 (H) 04/25/2022   AST 28 04/25/2022   ALKPHOS 95 04/25/2022   BILITOT 0.3 04/25/2022   Lab Results  Component Value Date   HGBA1C 4.9 03/06/2022   HGBA1C 4.9 01/04/2022   HGBA1C 5.1 05/03/2019   No results found for: "INSULIN" Lab Results  Component Value Date   TSH 2.130 04/25/2022   Lab Results  Component Value Date   CHOL 172 04/25/2022   HDL 35 (L) 04/25/2022   LDLCALC 106 (H) 04/25/2022   TRIG 178 (H) 04/25/2022   CHOLHDL 4.9 (H) 04/25/2022   Lab Results  Component Value Date   VD25OH 17.8 (L) 04/25/2022   VD25OH 10.0 (L) 01/04/2022   Lab Results  Component Value Date   WBC 9.3 04/25/2022   HGB 13.0  04/25/2022   HCT 39.7 04/25/2022   MCV 87 04/25/2022   PLT 306 04/25/2022   Lab Results  Component Value Date   FERRITIN 42 12/25/2021   Attestation Statements:   Reviewed by clinician on day of visit: allergies, medications, problem list, medical history, surgical history, family history, social history, and previous encounter notes.   Wilhemena Durie, am acting as Location manager for [employer], DO.  I have reviewed the above documentation for accuracy and completeness, and I agree with the above. Jearld Lesch, DO

## 2022-05-15 NOTE — Therapy (Incomplete)
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[place])    Past Surgical History:  Procedure Laterality Date   CHOLECYSTECTOMY N/A 08/29/2021   Procedure: LAPAROSCOPIC CHOLECYSTECTOMY WITH ICG;  Surgeon: Ileana Roup, MD;  Location: [facility];  Service: General;  Laterality: N/A;   [place] EXTRACTION     Patient Active Problem List   Diagnosis Date Noted   Acquired hypothyroidism 05/09/2022   Mixed hyperlipidemia 05/09/2022   Low HDL (under 40) 05/09/2022   Elevated glucose 05/09/2022   Eating disorder 05/09/2022   Other fatigue 05/09/2022   Class 3 severe obesity with serious comorbidity and body mass index (BMI) of 45.0 to 49.9 in adult ([place]) 05/09/2022   Lumbar degenerative disc disease 03/13/2022   Bipolar depression ([place]) 03/06/2022   Chronic bilateral low back pain with right-sided sciatica 02/15/2022   Vitamin D deficiency 01/04/2022   Loud snoring 01/04/2022   GAD (generalized anxiety disorder) 01/04/2022   Marijuana use 01/04/2022   Chest pain 12/25/2021   Transaminitis 12/25/2021   GERD without esophagitis 12/25/2021   Elevated lipase 12/25/2021   Compulsive skin picking 07/28/2020   Social phobia 07/28/2020   Bipolar I disorder, current or most recent episode depressed, in partial remission ([place]) 07/19/2020   Bilateral primary osteoarthritis of hip 07/19/2020   Allergic rhinitis due to pollen 05/12/2020   MDD (major depressive disorder), recurrent episode, severe ([place]) 05/02/2019   High risk  medication use 05/03/2017   Tobacco use 05/29/2016   Arthralgia of multiple sites, bilateral 03/06/2016   Morbid obesity ([place]) 11/05/2014   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  ONSET DATE: chronic   SUBJECTIVE:  SUBJECTIVE STATEMENT: *** PERTINENT HISTORY:  Depression Myocardial Infarction  Pericarditis RA Still's disease  Anxiety  Obesity   PAIN:  Are you having pain? Yes: NPRS scale: ***/10 Pain location: *** Pain description: *** Aggravating factors: *** Relieving factors: ***   PRECAUTIONS: None  WEIGHT BEARING RESTRICTIONS No  FALLS:  Has patient fallen in last 6 months? {fallsyesno:27318}  LIVING ENVIRONMENT: Lives with: {[facility] lives with:25569::"lives with their family"} Lives in: {Lives in:25570} Stairs: {opstairs:27293} Has following equipment at home: {Assistive devices:23999}  OCCUPATION: ***  PLOF: {PLOF:24004}  PATIENT GOALS ***   OBJECTIVE:   DIAGNOSTIC FINDINGS:  Lumbar X-ray: Lumbar spine x-rays show mild to moderate degenerative changes L4-5  PATIENT SURVEYS:  Modified Oswestry ***   SCREENING FOR RED FLAGS: Bowel or bladder incontinence: {Yes/No:304960894} Spinal tumors: {Yes/No:304960894} Cauda equina syndrome: {Yes/No:304960894} Compression fracture: {Yes/No:304960894} Abdominal aneurysm:  {Yes/No:304960894}  COGNITION:  Overall cognitive status: {cognition:24006}     SENSATION: {sensation:27233}  MUSCLE LENGTH: Hamstrings: Right *** deg; Left *** deg Thomas test: Right *** deg; Left *** deg  POSTURE: {posture:25561}  PALPATION: ***  LUMBAR ROM:   Active  A/PROM  eval  Flexion   Extension   Right lateral flexion   Left lateral flexion   Right rotation   Left rotation    (Blank rows = not tested)  LOWER EXTREMITY ROM:     Active  Right eval Left eval  Hip flexion    Hip extension    Hip abduction    Hip adduction    Hip internal rotation    Hip external rotation    Knee flexion    Knee extension    Ankle dorsiflexion    Ankle plantarflexion    Ankle inversion    Ankle eversion     (Blank rows = not tested)  LOWER EXTREMITY MMT:    MMT Right eval Left eval  Hip flexion    Hip extension    Hip abduction    Hip adduction    Hip internal rotation    Hip external rotation    Knee flexion    Knee extension    Ankle dorsiflexion    Ankle plantarflexion    Ankle inversion    Ankle eversion     (Blank rows = not tested)  LUMBAR SPECIAL TESTS:  SLR  FUNCTIONAL TESTS:  5 x STS  GAIT: Distance walked: *** Assistive device utilized: {Assistive devices:23999} Level of assistance: {Levels of assistance:24026} Comments: ***    TODAY'S TREATMENT  [facility] Adult PT Treatment:                                                DATE: 05/04/22 Therapeutic Exercise: *** Manual Therapy: *** Neuromuscular re-ed: *** Therapeutic Activity: *** Modalities: *** Self Care: ***    PATIENT EDUCATION:  Education details: *** Person educated: {Person educated:25204} Education method: {Education Method:25205} Education comprehension: {Education Comprehension:25206}   HOME EXERCISE PROGRAM: ***  ASSESSMENT:  CLINICAL IMPRESSION: Patient presents for physical therapy evaluation and treatment for chronic low back.    OBJECTIVE  IMPAIRMENTS {opptimpairments:25111}.   ACTIVITY LIMITATIONS {activitylimitations:27494}  PARTICIPATION LIMITATIONS: {participationrestrictions:25113}  PERSONAL FACTORS {Personal factors:25162} are also affecting patient's functional outcome.   REHAB POTENTIAL: {rehabpotential:25112}  CLINICAL DECISION MAKING: {clinical decision making:25114}  EVALUATION COMPLEXITY: {Evaluation complexity:25115}   GOALS: Goals reviewed with patient? {yes/no:20286}  SHORT TERM GOALS: Target date: {follow  up:25551}  *** Baseline: Goal status: {GOALSTATUS:25110}  2.  *** Baseline:  Goal status: {GOALSTATUS:25110}  3.  *** Baseline:  Goal status: {GOALSTATUS:25110}  4.  *** Baseline:  Goal status: {GOALSTATUS:25110}  5.  *** Baseline:  Goal status: {GOALSTATUS:25110}  6.  *** Baseline:  Goal status: {GOALSTATUS:25110}  LONG TERM GOALS: Target date: {follow up:25551}  *** Baseline:  Goal status: {GOALSTATUS:25110}  2.  *** Baseline:  Goal status: {GOALSTATUS:25110}  3.  *** Baseline:  Goal status: {GOALSTATUS:25110}  4.  *** Baseline:  Goal status: {GOALSTATUS:25110}  5.  *** Baseline:  Goal status: {GOALSTATUS:25110}  6.  *** Baseline:  Goal status: {GOALSTATUS:25110}   PLAN: PT FREQUENCY: {rehab frequency:25116}  PT DURATION: {rehab duration:25117}  PLANNED INTERVENTIONS: {rehab planned interventions:25118::"Therapeutic exercises","Therapeutic activity","Neuromuscular re-education","Balance training","Gait training","Patient/Family education","Self Care","Joint mobilization"}.  PLAN FOR NEXT SESSION: ***  Gwendolyn Grant, PT, DPT, ATC 05/23/22 8:31 AM  [employer] Authorization   Choose one: Rehabilitative  Standardized Assessment or Functional Outcome Tool: See Pain Assessment and Oswestry  Score or Percent Disability: ***  Body Parts Treated (Select each separately):  {BodyParts:24813}. Overall deficits/functional limitations for body part selected:  {MILD/MODERATE:22562} {BodyParts:24813}. Overall deficits/functional limitations for body part selected: {MILD/MODERATE:22562} {BodyParts:24813}. Overall deficits/functional limitations for body part selected: {MILD/MODERATE:22562}   If treatment provided at initial evaluation, no treatment charged due to lack of authorization.

## 2022-05-18 ENCOUNTER — Ambulatory Visit (INDEPENDENT_AMBULATORY_CARE_PROVIDER_SITE_OTHER): Payer: Medicaid Other | Admitting: Neurology

## 2022-05-18 DIAGNOSIS — R0681 Apnea, not elsewhere classified: Secondary | ICD-10-CM

## 2022-05-18 DIAGNOSIS — R351 Nocturia: Secondary | ICD-10-CM

## 2022-05-18 DIAGNOSIS — G4730 Sleep apnea, unspecified: Secondary | ICD-10-CM

## 2022-05-18 DIAGNOSIS — R635 Abnormal weight gain: Secondary | ICD-10-CM

## 2022-05-18 DIAGNOSIS — G4733 Obstructive sleep apnea (adult) (pediatric): Secondary | ICD-10-CM

## 2022-05-18 DIAGNOSIS — G4719 Other hypersomnia: Secondary | ICD-10-CM

## 2022-05-18 DIAGNOSIS — R519 Headache, unspecified: Secondary | ICD-10-CM

## 2022-05-18 DIAGNOSIS — G4720 Circadian rhythm sleep disorder, unspecified type: Secondary | ICD-10-CM

## 2022-05-18 DIAGNOSIS — R0683 Snoring: Secondary | ICD-10-CM

## 2022-05-18 DIAGNOSIS — R030 Elevated blood-pressure reading, without diagnosis of hypertension: Secondary | ICD-10-CM

## 2022-05-22 ENCOUNTER — Other Ambulatory Visit: Payer: Self-pay | Admitting: Family Medicine

## 2022-05-22 DIAGNOSIS — E039 Hypothyroidism, unspecified: Secondary | ICD-10-CM

## 2022-05-22 DIAGNOSIS — L249 Irritant contact dermatitis, unspecified cause: Secondary | ICD-10-CM | POA: Diagnosis not present

## 2022-05-22 NOTE — Procedures (Signed)
Physician Interpretation:     [facility] POLYSOMNOGRAPHY  INTERPRETATION REPORT   STUDY DATE:  05/18/2022     PATIENT NAME:  [name]         DATE OF BIRTH:  1991-01-11  PATIENT ID:  [id number]    TYPE OF STUDY:  PSG  READING PHYSICIAN: Star Age, MD, PhD REFERRED BY: Darla Lesches, NP SCORING TECHNICIAN: Gaylyn Cheers, RPSGT   HISTORY:  31 year old transgender female with a history of rheumatoid arthritis, pericarditis, Hx of MI, anxiety, depression, mixed hyperlipidemia, elevated liver enzymes, hypothyroidism, and morbid obesity with a BMI of over 45, who reports snoring and excessive daytime somnolence. ?His Epworth sleepiness score is 11 out of 24, fatigue severity score is 53 out of 63.  Height: 63 in Weight: 273 lb (BMI 48) Neck Size: 19 in  MEDICATIONS: Xanax, Abilify, Aspirin, Flexeril, Benadryl, Advil, Synthroid, Prilosec, Zofran, Alli, Prednisone, Crestor, Depotestosterone cypionate, Ultram, Drisdol TECHNICAL DESCRIPTION: A registered sleep technologist  was in attendance for the duration of the recording.  Data collection, scoring, video monitoring, and reporting were performed in compliance with the AASM Manual for the Scoring of Sleep and Associated Events; (Hypopnea is scored based on the criteria listed in Section VIII D. 1b in the AASM Manual V2.6 using a 4% oxygen desaturation rule or Hypopnea is scored based on the criteria listed in Section VIII D. 1a in the AASM Manual V2.6 using 3% oxygen desaturation and /or arousal rule).   SLEEP CONTINUITY AND SLEEP ARCHITECTURE:  Lights-out was at 22:02: and lights-on at  04:58:, with a total recording time of 6 hours, 56 min. Total sleep time ( TST) was 387.5 minutes with a normal sleep efficiency at 93.0%.  BODY POSITION:  TST was divided  between the following sleep positions: 62.3% supine;  37.7% lateral;  0% prone. Duration of total sleep and percent of total sleep in their respective  position is as follows: supine 241 minutes (62%), non-supine 146 minutes (38%); right 146 minutes (38%), left 00 minutes (0%), and prone 00 minutes (0%).  Total supine REM sleep time was 43 minutes (48% of total REM sleep).  Sleep latency was normal at 11.5 minutes.  REM sleep latency was increased at 118.5 minutes. Of the total sleep time, the percentage of stage N1 sleep was 5.2%, stage N2 sleep was 66%, which is increased, stage N3 sleep was 6.2%, and REM sleep was 23.1%, which is normal. Wake after sleep onset (WASO) time accounted for 14 minutes with minimal to mild sleep fragmentation noted.   RESPIRATORY MONITORING:   Based on CMS criteria (using a 4% oxygen desaturation rule for scoring hypopneas), there were 37 apneas (33 obstructive; 3 central; 1 mixed), and 53 hypopneas.  Apnea index was 5.7. Hypopnea index was 8.2. The apnea-hypopnea index was 13.9/hour overall (13.9 supine, 28 non-supine; 38.9 REM, 50.2 supine REM).  There were 0 respiratory effort-related arousals (RERAs).  The RERA index was 0 events/h. Total respiratory disturbance index (RDI) was 13.9 events/h. RDI results showed: supine RDI  13.9 /h; non-supine RDI 14.0 /h; REM RDI 38.9 /h, supine REM RDI 50.2 /h.   Based on AASM criteria (using a 3% oxygen desaturation and /or arousal rule for scoring hypopneas), there were 37 apneas (33 obstructive; 3 central; 1 mixed), and 67 hypopneas. Apnea index was 5.7. Hypopnea index was 10.4. The apnea-hypopnea index was 16.1 overall (16.1 supine, 32 non-supine; 40.9 REM, 50.2 supine REM).  There were 0 respiratory effort-related arousals (RERAs).  The RERA index was 0 events/h. Total respiratory disturbance index (RDI) was 16.1 events/h. RDI results showed: supine RDI  16.1 /h; non-supine RDI 16.0 /h; REM RDI 40.9 /h, supine REM RDI 50.2 /h.   OXIMETRY: Oxyhemoglobin Saturation Nadir during sleep was at  81%) from a mean of 96%.  Of the Total sleep time (TST)   hypoxemia (=<88%) was present for   6.2 minutes, or 1.6% of total sleep time.   LIMB MOVEMENTS: There were 0 periodic limb movements of sleep (0.0/hr), of which 0 (0.0/hr) were associated with an arousal.  AROUSAL: There were 33 arousals in total, for an arousal index of 5 arousals/hour.  Of these, 15 were identified as respiratory-related arousals (2 /h), 0 were PLM-related arousals (0 /h), and 20 were non-specific arousals (3 /h).  EEG: Review of the EEG showed no abnormal electrical discharges and symmetrical bihemispheric findings.    EKG: The EKG revealed normal sinus rhythm (NSR). The average heart rate during sleep was 73 bpm. The heart rate during sleep varied between a minimum of Tachycardia and a maximum of 102 bpm.  AUDIO/VIDEO REVIEW: The audio and video review did not show any abnormal or unusual behaviors, movements, phonations or vocalizations. The patient took 1 restroom break. Snoring was mild to moderate.  POST-STUDY QUESTIONNAIRE: Post study, the patient indicated, that sleep was the same as usual.   IMPRESSION:   1. Obstructive sleep apnea (OSA) 2. Dysfunctions associated with sleep stages or arousal from sleep  RECOMMENDATIONS:   1. This study demonstrates overall mild obstructive sleep apnea, severe in REM sleep with a total AHI of 13.9/hour, REM AHI of 38.9/hour, and O2 nadir of 81%. Given the patient's medical history and sleep related complaints, treatment with positive airway pressure is recommended; this can be achieved in the form of autoPAP. Alternatively, a full-night CPAP titration study would allow optimization of therapy if needed. Other treatment options may include avoidance of supine sleep position along with weight loss, or the use of an oral appliance in selected patients. Please note, that untreated obstructive sleep apnea may carry additional perioperative morbidity. Patients with significant obstructive sleep apnea should receive perioperative PAP therapy and the surgeons and particularly  the anesthesiologist should be informed of the diagnosis and the severity of the sleep disordered breathing. 2. This study shows mild sleep fragmentation and mildly abnormal sleep stage percentages; these are nonspecific findings and per se do not signify an intrinsic sleep disorder or a cause for the patient's sleep-related symptoms. Causes include (but are not limited to) the first night effect of the sleep study, circadian rhythm disturbances, medication effect or an underlying mood disorder or medical problem.  3. The patient should be cautioned not to drive, work at heights, or operate dangerous or heavy equipment when tired or sleepy. Review and reiteration of good sleep hygiene measures should be pursued with any patient. 4. The patient will be seen in follow-up by Dr. Rexene Alberts at [facility] for discussion of the test results and further management strategies. The referring provider will be notified of the test results.   I certify that I have reviewed the entire raw data recording prior to the issuance of this report in accordance with the Standards of Accreditation of the American Academy of Sleep Medicine (AASM).  Star Age, MD, PhD [facility]) [place], ABPN (Neurology and Sleep)             Technical Report:   General Information  Name: [name], [name] BMI: 48.36  Physician: Star Age, MD  ID: [id number] Height: 63.0 in Technician: Gaylyn Cheers, RPSGT  Sex: Female Weight: 273.0 lb Record: [id number]  Age: 4 [October 10, 1990] Date: 05/18/2022    Medical & Medication History    [name] is a 31 year old transgender (female to female) man with an underlying medical history of rheumatoid arthritis, pericarditis, Hx of MI, anxiety, depression, mixed hyperlipidemia, elevated liver enzymes, hypothyroidism, and morbid obesity with a BMI of over 45, who reports snoring and excessive daytime somnolence. His Epworth sleepiness score is 11 out of 24, fatigue severity score  is 53 out of 63. Xanax, Abilify, Aspirin, Flexeril, Benadryl, Advil, Synthroid, Prilosec, Zofran, Alli, Prednisone, Crestor, Depotestosterone cypionate, Ultram, Drisdol   Sleep Disorder      Comments   Patient arrived for a diagnostic polysomnogram. Procedure explained and all questions answered. Standard paste setup without complications. Patient slept supine and right side. Mild to moderate snoring was heard. Respiratory events observed, primarily in REM sleep. Irregular cardiac rhythm noted. No significant PLMS observed. Patient had one restroom visit.    Lights out: 10:02:11 PM Lights on: 04:58:27 AM   Time Total Supine Side Prone Upright  Recording (TRT) 6h 56.52m 4h 16.41m 2h 40.48m 0h 0.92m 0h 0.59m  Sleep (TST) 6h 31.18m 4h 1.43m 2h 29.60m 0h 0.63m 0h 0.73m   Latency N1 N2 N3 REM Onset Per. Slp. Eff.  Actual 0h 0.80m 0h 3.33m 0h 32.54m 1h 58.50m 0h 11.73m 0h 11.77m 93.88%   Stg Dur Wake N1 N2 N3 REM  Total 25.5 20.0 254.0 24.0 89.5  Supine 14.5 14.5 160.0 24.0 43.0  Side 11.0 5.5 94.0 0.0 46.5  Prone 0.0 0.0 0.0 0.0 0.0  Upright 0.0 0.0 0.0 0.0 0.0   Stg % Wake N1 N2 N3 REM  Total 6.1 5.1 65.0 6.1 22.9  Supine 3.5 3.7 40.9 6.1 11.0  Side 2.6 1.4 24.0 0.0 11.9  Prone 0.0 0.0 0.0 0.0 0.0  Upright 0.0 0.0 0.0 0.0 0.0     Apnea Summary Sub Supine Side Prone Upright  Total 37 Total 37 21 16 0 0    REM 20 9 11  0 0    NREM 17 12 5  0 0  Obs 33 REM 19 9 10  0 0    NREM 14 9 5  0 0  Mix 1 REM 1 0 1 0 0    NREM 0 0 0 0 0  Cen 3 REM 0 0 0 0 0    NREM 3 3 0 0 0   Rera Summary Sub Supine Side Prone Upright  Total 0 Total 0 0 0 0 0    REM 0 0 0 0 0    NREM 0 0 0 0 0   Hypopnea Summary Sub Supine Side Prone Upright  Total 67 Total 67 44 23 0 0    REM 41 27 14 0 0    NREM 26 17 9  0 0   4% Hypopnea Summary Sub Supine Side Prone Upright  Total (4%) 53 Total 53 35 18 0 0    REM 38 27 11 0 0    NREM 15 8 7  0 0     AHI Total Obs Mix Cen  15.96 Apnea 5.68 5.06 0.15 0.46   Hypopnea 10.28 --  -- --  13.81 Hypopnea (4%) 8.13 -- -- --    Total Supine Side Prone Upright  Position AHI 15.96 16.15 15.65 0.00 0.00  REM AHI 40.89   NREM AHI 8.66   Position  RDI 15.96 16.15 15.65 0.00 0.00  REM RDI 40.89   NREM RDI 8.66    4% Hypopnea Total Supine Side Prone Upright  Position AHI (4%) 13.81 13.91 13.65 0.00 0.00  REM AHI (4%) 38.88   NREM AHI (4%) 6.44   Position RDI (4%) 13.81 13.91 13.65 0.00 0.00  REM RDI (4%) 38.88   NREM RDI (4%) 6.44    Desaturation Information Threshold: 2% <100% <90% <80% <70% <60% <50% <40%  Supine 138.0 31.0 0.0 0.0 0.0 0.0 0.0  Side 76.0 11.0 0.0 0.0 0.0 0.0 0.0  Prone 0.0 0.0 0.0 0.0 0.0 0.0 0.0  Upright 0.0 0.0 0.0 0.0 0.0 0.0 0.0  Total 214.0 42.0 0.0 0.0 0.0 0.0 0.0  Index 32.2 6.3 0.0 0.0 0.0 0.0 0.0   Threshold: 3% <100% <90% <80% <70% <60% <50% <40%  Supine 69.0 29.0 0.0 0.0 0.0 0.0 0.0  Side 47.0 11.0 0.0 0.0 0.0 0.0 0.0  Prone 0.0 0.0 0.0 0.0 0.0 0.0 0.0  Upright 0.0 0.0 0.0 0.0 0.0 0.0 0.0  Total 116.0 40.0 0.0 0.0 0.0 0.0 0.0  Index 17.4 6.0 0.0 0.0 0.0 0.0 0.0   Threshold: 4% <100% <90% <80% <70% <60% <50% <40%  Supine 56.0 29.0 0.0 0.0 0.0 0.0 0.0  Side 35.0 11.0 0.0 0.0 0.0 0.0 0.0  Prone 0.0 0.0 0.0 0.0 0.0 0.0 0.0  Upright 0.0 0.0 0.0 0.0 0.0 0.0 0.0  Total 91.0 40.0 0.0 0.0 0.0 0.0 0.0  Index 13.7 6.0 0.0 0.0 0.0 0.0 0.0   Threshold: 3% <100% <90% <80% <70% <60% <50% <40%  Supine 69 29 0 0 0 0 0  Side 47 11 0 0 0 0 0  Prone 0 0 0 0 0 0 0  Upright 0 0 0 0 0 0 0  Total 116 40 0 0 0 0 0   Awakening/Arousal Information # of Awakenings 7  Wake after sleep onset 14.24m  Wake after persistent sleep 14.65m   Arousal Assoc. Arousals Index  Apneas 6 0.9  Hypopneas 9 1.4  Leg Movements 1 0.2  Snore 0 0.0  PTT Arousals 0 0.0  Spontaneous 21 3.2  Total 37 5.7  Leg Movement Information PLMS LMs Index  Total LMs during PLMS 0 0.0  LMs w/ Microarousals 0 0.0   LM LMs Index  w/ Microarousal 1 0.2  w/ Awakening 1 0.2   w/ Resp Event 0 0.0  Spontaneous 0 0.0  Total 1 0.2     Desaturation threshold setting: 3% Minimum desaturation setting: 10 seconds SaO2 nadir: 81% The longest event was a 33 sec obstructive Hypopnea with a minimum SaO2 of 89%. The lowest SaO2 was 81% associated with a 29 sec obstructive Hypopnea. EKG Rates EKG Avg Max Min  Awake 90 103 67  Asleep 73 102 45  EKG Events: Tachycardia

## 2022-05-23 ENCOUNTER — Encounter: Payer: Self-pay | Admitting: Bariatrics

## 2022-05-23 ENCOUNTER — Ambulatory Visit (INDEPENDENT_AMBULATORY_CARE_PROVIDER_SITE_OTHER): Payer: Medicaid Other | Admitting: Bariatrics

## 2022-05-23 ENCOUNTER — Ambulatory Visit: Payer: Medicaid Other

## 2022-05-23 VITALS — BP 126/74 | HR 105 | Temp 98.1°F | Ht 63.0 in | Wt 281.0 lb

## 2022-05-23 DIAGNOSIS — E8881 Metabolic syndrome: Secondary | ICD-10-CM

## 2022-05-23 DIAGNOSIS — E538 Deficiency of other specified B group vitamins: Secondary | ICD-10-CM | POA: Diagnosis not present

## 2022-05-23 DIAGNOSIS — E559 Vitamin D deficiency, unspecified: Secondary | ICD-10-CM | POA: Diagnosis not present

## 2022-05-23 DIAGNOSIS — R5383 Other fatigue: Secondary | ICD-10-CM | POA: Diagnosis not present

## 2022-05-23 DIAGNOSIS — Z1331 Encounter for screening for depression: Secondary | ICD-10-CM

## 2022-05-23 DIAGNOSIS — E039 Hypothyroidism, unspecified: Secondary | ICD-10-CM

## 2022-05-23 DIAGNOSIS — E669 Obesity, unspecified: Secondary | ICD-10-CM

## 2022-05-23 DIAGNOSIS — Z0000 Encounter for general adult medical examination without abnormal findings: Secondary | ICD-10-CM

## 2022-05-23 DIAGNOSIS — Z6841 Body Mass Index (BMI) 40.0 and over, adult: Secondary | ICD-10-CM

## 2022-05-23 DIAGNOSIS — R0602 Shortness of breath: Secondary | ICD-10-CM | POA: Diagnosis not present

## 2022-05-23 DIAGNOSIS — E782 Mixed hyperlipidemia: Secondary | ICD-10-CM | POA: Diagnosis not present

## 2022-05-23 NOTE — Addendum Note (Signed)
Addended by: Star Age on: 05/23/2022 06:39 PM   Modules accepted: Orders

## 2022-05-24 ENCOUNTER — Telehealth: Payer: Self-pay | Admitting: *Deleted

## 2022-05-24 DIAGNOSIS — F649 Gender identity disorder, unspecified: Secondary | ICD-10-CM | POA: Diagnosis not present

## 2022-05-24 NOTE — Telephone Encounter (Signed)
-----   Message from Star Age, MD sent at 05/23/2022  6:39 PM EDT ----- Patient referred by PCP, seen by me on 03/01/22, diagnostic PSG on 05/18/22.    Please call and notify the patient that the recent sleep study showed obstructive sleep apnea. OSA is overall mild, but much more pronounced during REM sleep. OSA is worth treating to see if he feels better after treatment. To that end I recommend treatment for this in the form of autoPAP, which means, that we don't have to bring him back for a second sleep study with CPAP, but will let him try an autoPAP machine at home, through a DME company (of his choice, or as per insurance requirement). The DME representative will educate him on how to use the machine, how to put the mask on, etc. I have placed an order in the chart. Please send referral, talk to patient, send report to referring MD. We will need a FU in sleep clinic for 10 weeks post-PAP set up, please arrange that with me or one of our NPs. Thanks,   Star Age, MD, PhD [facility])

## 2022-05-24 NOTE — Telephone Encounter (Signed)
Pt returned my call. We discussed pt's sleep study results and recommendation by Dr Rexene Alberts for to try autopap at home. Pt is amenable to proceeding with autopap. We discussed the insurance compliance requirements which includes using the machine at least 4 hours at night and also being seen in our office for an initial follow-up appointment between 30 and 90 days after setup. Pt scheduled an appt on Thursday 07/27/22 at 115 pm. Discussed DME, pt would like [facility].   Autopap referral faxed to [facility]. Received a receipt of confirmation.

## 2022-05-24 NOTE — Therapy (Deleted)
OUTPATIENT PHYSICAL THERAPY THORACOLUMBAR EVALUATION   Patient Name: [name] MRN: [id number] DOB:09/23/1990, 31 y.o., adult Today's Date: 05/24/2022    Past Medical History:  Diagnosis Date   Anxiety attack    panic attacks   Back pain    Chest pain    Constipation    Depression    Drug use    Food allergy    Gallbladder problem    GERD (gastroesophageal reflux disease)    Headache    High blood pressure    History of heart attack    History of stomach ulcers    Hypothyroidism    Joint pain    Myocardial infarction [facility])    age 31   Osteoarthritis    Pericarditis    RA (rheumatoid arthritis) (HCC)    SOB (shortness of breath)    Still disease, 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[place])    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[place])    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[place])    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[place])    Swallowing difficulty    Vitamin D deficiency    Past Surgical History:  Procedure Laterality Date   CHOLECYSTECTOMY N/A 08/29/2021   Procedure: LAPAROSCOPIC CHOLECYSTECTOMY WITH ICG;  Surgeon: Ileana Roup, MD;  Location: [facility];  Service: General;  Laterality: N/A;   WISDOM TOOTH EXTRACTION     Patient Active Problem List   Diagnosis Date Noted   SOB (shortness of breath) on exertion 05/23/2022   Health care maintenance 41/96/2229   Metabolic syndrome 79/89/2119   Vitamin B12 deficiency 05/23/2022   Acquired hypothyroidism 05/09/2022   Mixed hyperlipidemia 05/09/2022   Low HDL (under 40) 05/09/2022   Elevated glucose 05/09/2022   Eating disorder 05/09/2022   Other fatigue 05/09/2022   Class 3 severe obesity with serious comorbidity and body mass index (BMI) of 45.0 to 49.9 in adult ([place]) 05/09/2022   Lumbar degenerative disc disease 03/13/2022   Bipolar depression ([place]) 03/06/2022   Chronic bilateral low back pain with right-sided sciatica 02/15/2022   Vitamin D deficiency 01/04/2022   Loud snoring 01/04/2022   GAD (generalized anxiety disorder) 01/04/2022   Marijuana use 01/04/2022   Chest pain  12/25/2021   Transaminitis 12/25/2021   GERD without esophagitis 12/25/2021   Elevated lipase 12/25/2021   Compulsive skin picking 07/28/2020   Social phobia 07/28/2020   Bipolar I disorder, current or most recent episode depressed, in partial remission ([place]) 07/19/2020   Bilateral primary osteoarthritis of hip 07/19/2020   Allergic rhinitis due to pollen 05/12/2020   MDD (major depressive disorder), recurrent episode, severe ([place]) 05/02/2019   High risk medication use 05/03/2017   Tobacco use 05/29/2016   Arthralgia of multiple sites, bilateral 03/06/2016   Morbid obesity ([place]) 11/05/2014   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  ONSET DATE: ***  SUBJECTIVE:  SUBJECTIVE STATEMENT: ***  PERTINENT HISTORY:  ***  PAIN:  Are you having pain? {OPRCPAIN:27236}  PRECAUTIONS: {Therapy precautions:24002}  WEIGHT BEARING RESTRICTIONS: {Yes ***/No:24003}  FALLS:  Has patient fallen in last 6 months? {fallsyesno:27318}  LIVING ENVIRONMENT: Lives with: {OPRC lives with:25569::"lives with their family"} Lives in: {Lives in:25570} Stairs: {opstairs:27293} Has following equipment at home: {Assistive devices:23999}  OCCUPATION: ***  PLOF: {PLOF:24004}  PATIENT GOALS: ***   OBJECTIVE:   DIAGNOSTIC FINDINGS:  ***  PATIENT SURVEYS:  {rehab surveys:24030}  SCREENING FOR RED FLAGS: Bowel or bladder incontinence: {Yes/No:304960894} Spinal tumors: {Yes/No:304960894} Cauda equina syndrome:  {Yes/No:304960894} Compression fracture: {Yes/No:304960894} Abdominal aneurysm: {Yes/No:304960894}  COGNITION: Overall cognitive status: {cognition:24006}     SENSATION: {sensation:27233}  MUSCLE LENGTH: Hamstrings: Right *** deg; Left *** deg Thomas test: Right *** deg; Left *** deg  POSTURE: {posture:25561}  PALPATION: ***  LUMBAR ROM:   AROM eval  Flexion   Extension   Right lateral flexion   Left lateral flexion   Right rotation   Left rotation    (Blank rows = not tested)  LOWER EXTREMITY ROM:     {AROM/PROM:27142}  Right eval Left eval  Hip flexion    Hip extension    Hip abduction    Hip adduction    Hip internal rotation    Hip external rotation    Knee flexion    Knee extension    Ankle dorsiflexion    Ankle plantarflexion    Ankle inversion    Ankle eversion     (Blank rows = not tested)  LOWER EXTREMITY MMT:    MMT Right eval Left eval  Hip flexion    Hip extension    Hip abduction    Hip adduction    Hip internal rotation    Hip external rotation    Knee flexion    Knee extension    Ankle dorsiflexion    Ankle plantarflexion    Ankle inversion    Ankle eversion     (Blank rows = not tested)  LUMBAR SPECIAL TESTS:  {lumbar special test:25242}  FUNCTIONAL TESTS:  {Functional tests:24029}  GAIT: Distance walked: *** Assistive device utilized: {Assistive devices:23999} Level of assistance: {Levels of assistance:24026} Comments: ***  TODAY'S TREATMENT:                                                                                                                              DATE: ***    PATIENT EDUCATION:  Education details: *** Person educated: {Person educated:25204} Education method: {Education Method:25205} Education comprehension: {Education Comprehension:25206}  HOME EXERCISE PROGRAM: ***  ASSESSMENT:  CLINICAL IMPRESSION: Patient is a *** y.o. *** who was seen today for physical therapy evaluation and  treatment for ***.   OBJECTIVE IMPAIRMENTS: {opptimpairments:25111}.   ACTIVITY LIMITATIONS: {activitylimitations:27494}  PARTICIPATION LIMITATIONS: {participationrestrictions:25113}  PERSONAL FACTORS: {Personal factors:25162} are also affecting patient's functional outcome.   REHAB POTENTIAL: {rehabpotential:25112}  CLINICAL DECISION MAKING: {clinical decision making:25114}  EVALUATION COMPLEXITY: {  Evaluation complexity:25115}   GOALS: Goals reviewed with patient? {yes/no:20286}  SHORT TERM GOALS: Target date: {follow up:25551}  *** Baseline: Goal status: {GOALSTATUS:25110}  2.  *** Baseline:  Goal status: {GOALSTATUS:25110}  3.  *** Baseline:  Goal status: {GOALSTATUS:25110}  4.  *** Baseline:  Goal status: {GOALSTATUS:25110}  5.  *** Baseline:  Goal status: {GOALSTATUS:25110}  6.  *** Baseline:  Goal status: {GOALSTATUS:25110}  LONG TERM GOALS: Target date: {follow up:25551}  *** Baseline:  Goal status: {GOALSTATUS:25110}  2.  *** Baseline:  Goal status: {GOALSTATUS:25110}  3.  *** Baseline:  Goal status: {GOALSTATUS:25110}  4.  *** Baseline:  Goal status: {GOALSTATUS:25110}  5.  *** Baseline:  Goal status: {GOALSTATUS:25110}  6.  *** Baseline:  Goal status: {GOALSTATUS:25110}  PLAN:  PT FREQUENCY: {rehab frequency:25116}  PT DURATION: {rehab duration:25117}  PLANNED INTERVENTIONS: {rehab planned interventions:25118::"Therapeutic exercises","Therapeutic activity","Neuromuscular re-education","Balance training","Gait training","Patient/Family education","Self Care","Joint mobilization"}.  PLAN FOR NEXT SESSION: ***   Ferry Matthis, PT 05/24/2022, 10:38 AM

## 2022-05-24 NOTE — Telephone Encounter (Signed)
Called pt & LVM with office number asking for call back to discuss results.  

## 2022-05-25 ENCOUNTER — Ambulatory Visit: Payer: Medicaid Other | Attending: Physical Therapy | Admitting: Physical Therapy

## 2022-05-25 LAB — VITAMIN B12: Vitamin B-12: 476 pg/mL (ref 232–1245)

## 2022-05-25 LAB — HEMOGLOBIN A1C
Est. average glucose Bld gHb Est-mCnc: 111 mg/dL
Hgb A1c MFr Bld: 5.5 % (ref 4.8–5.6)

## 2022-05-25 LAB — INSULIN, RANDOM: INSULIN: 16.4 u[IU]/mL (ref 2.6–24.9)

## 2022-05-29 ENCOUNTER — Telehealth: Payer: Self-pay | Admitting: Neurology

## 2022-05-29 NOTE — Telephone Encounter (Signed)
LVM and sent mychart msg informing pt of need to reschedule 12/28 appointment - MD out 

## 2022-05-31 DIAGNOSIS — Z419 Encounter for procedure for purposes other than remedying health state, unspecified: Secondary | ICD-10-CM | POA: Diagnosis not present

## 2022-06-01 ENCOUNTER — Other Ambulatory Visit: Payer: Self-pay | Admitting: *Deleted

## 2022-06-01 NOTE — Patient Outreach (Signed)
Medicaid Managed Care   Nurse Care Manager Note  06/01/2022 Name:  [name] MRN:  [id number] DOB:  [birth year]  [name] is an 31 y.o. year old adult who is [name] primary patient of [name], Kathryn Albino, FNP.  The [facility] Managed Care Coordination team was consulted for assistance with:    Weight Loss  [name] was given information about Medicaid Managed Care Coordination team services today. [name] Patient agreed to services and verbal consent obtained.  Engaged with patient by telephone for initial visit in response to provider referral for case management and/or care coordination services.   Assessments/Interventions:  Review of past medical history, allergies, medications, health status, including review of consultants reports, laboratory and other test data, was performed as part of comprehensive evaluation and provision of chronic care management services.  SDOH (Social Determinants of Health) assessments and interventions performed: SDOH Interventions    Flowsheet Row Patient Outreach Telephone from 06/01/2022 in [facility] Coordination Most recent reading at 06/01/2022 12:44 PM Patient Outreach Telephone from 03/21/2022 in [facility] Most recent reading at 05/18/2022  9:11 AM Office Visit from 04/25/2022 in [facility] Most recent reading at 04/25/2022  3:14 PM Office Visit from 02/15/2022 in [facility] Most recent reading at 02/15/2022  9:26 AM Office Visit from 01/04/2022 in [facility] Most recent reading at 01/04/2022 11:41 AM  SDOH Interventions       Food Insecurity Interventions Other (Comment)  [Referral to [employer] for local food pantries] -- -- -- --  Housing Interventions Intervention Not Indicated Intervention Not Indicated -- -- --  Transportation Interventions Intervention Not Indicated Intervention Not Indicated -- -- --   Utilities Interventions Intervention Not Indicated -- -- -- --  Depression Interventions/Treatment  -- -- Currently on Treatment, Counseling Currently on Treatment, Referral to Psychiatry, Counseling Referral to Psychiatry, Currently on Treatment  Stress Interventions -- Provide Counseling, [employer] -- -- --       Care Plan  Allergies  Allergen Reactions   Strawberry Extract Itching and Rash    Medications Reviewed Today     Reviewed by Kathryn Dach, RN (Registered Nurse) on 06/01/22 at 1242  Med List Status: <None>   Medication Order Taking? Sig Documenting Provider Last Dose Status Informant  amitriptyline (ELAVIL) 25 MG tablet [id number] Yes Take 1 tablet (25 mg total) by mouth at bedtime. [name] Taking Active   ARIPiprazole (ABILIFY) 10 MG tablet [id number] Yes Take 1 tablet (10 mg total) by mouth daily. [name] Taking Active   gabapentin (NEURONTIN) 300 MG capsule [id number] Yes Take 300 mg by mouth 3 (three) times daily. Provider, Historical, [name] Taking Active            Med Note ([name]   Thu Jun 01, 2022 12:38 PM) Taking 4 times daily  gabapentin (NEURONTIN) 300 MG capsule [id number]  Take by mouth. Provider, Historical, [name]  Expired 05/18/22 2359   hydrOXYzine (ATARAX) 25 MG tablet [id number] Yes Take 1 tablet (25 mg total) by mouth 3 (three) times daily as needed for anxiety. [name] Taking Active   ibuprofen (ADVIL) 200 MG tablet [id number] Yes Take 800 mg by mouth every 6 (six) hours as needed (For leg pain). Provider, Historical, [name] Taking Active Self  levothyroxine (SYNTHROID) 25 MCG tablet [id number] Yes TAKE ONE TABLET BY MOUTH DAILY [name], Kathryn Albino, FNP  Taking Active   metFORMIN (GLUCOPHAGE) 500 MG tablet [id number] Yes Take 1 tablet (500 mg total) by mouth 2 (two) times daily with [name] meal. [name], Kathryn Albino, FNP Taking Active   omeprazole (PRILOSEC) 40 MG capsule [id number] Yes Take 1  capsule (40 mg total) by mouth daily. Kathryn Masters, FNP Taking Active Self  ondansetron (ZOFRAN) 8 MG tablet [id number] Yes Take by mouth. Provider, Historical, [name] Taking Active   promethazine (PHENERGAN) 25 MG tablet [id number] Yes Take 25 mg by mouth every 6 (six) hours as needed. Provider, Historical, [name] Taking Active   propranolol (INDERAL) 40 MG tablet [id number] No Take 1 tablet (40 mg total) by mouth at bedtime.  Patient not taking: Reported on 05/09/2022   Kathryn Masters, FNP Not Taking Active   rosuvastatin (CRESTOR) 5 MG tablet [id number] Yes Take 1 tablet (5 mg total) by mouth daily. Kathryn Pop, [name] Taking Active Self  Vitamin D, Ergocalciferol, (DRISDOL) 1.25 MG (50000 UNIT) CAPS capsule [id number] Yes Take 1 capsule (50,000 Units total) by mouth every 7 (seven) days. [name] Taking Active             Patient Active Problem List   Diagnosis Date Noted   SOB (shortness of breath) on exertion 05/23/2022   Health care maintenance 05/23/2022   Metabolic syndrome 05/23/2022   Vitamin B12 deficiency 05/23/2022   Acquired hypothyroidism 05/09/2022   Mixed hyperlipidemia 05/09/2022   Low HDL (under 40) 05/09/2022   Elevated glucose 05/09/2022   Eating disorder 05/09/2022   Other fatigue 05/09/2022   Class 3 severe obesity with serious comorbidity and body mass index (BMI) of 45.0 to 49.9 in adult (HCC) 05/09/2022   Lumbar degenerative disc disease 03/13/2022   Bipolar depression (HCC) 03/06/2022   Chronic bilateral low back pain with right-sided sciatica 02/15/2022   Vitamin D deficiency 01/04/2022   Loud snoring 01/04/2022   GAD (generalized anxiety disorder) 01/04/2022   Marijuana use 01/04/2022   Chest pain 12/25/2021   Transaminitis 12/25/2021   GERD without esophagitis 12/25/2021   Elevated lipase 12/25/2021   Compulsive skin picking 07/28/2020   Social phobia 07/28/2020   Bipolar I disorder, current or most recent episode depressed, in partial  remission (HCC) 07/19/2020   Bilateral primary osteoarthritis of hip 07/19/2020   Allergic rhinitis due to pollen 05/12/2020   MDD (major depressive disorder), recurrent episode, severe (HCC) 05/02/2019   High risk medication use 05/03/2017   Tobacco use 05/29/2016   Arthralgia of multiple sites, bilateral 03/06/2016   Morbid obesity (HCC) 11/05/2014   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   Insomnia 02/18/2014   Bipolar affective disorder, currently depressed, moderate (HCC) 05/25/2011   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    Conditions to be addressed/monitored per PCP order:   Weight Loss  Care Plan : RN Care Manager Plan of Care  Updates made by Kathryn Dach, RN since 06/01/2022 12:00 AM     Problem: Health Management needs related to Weight Management      Long-Range Goal: Development of Plan of Care to address Health Management needs related to Weight Management   Start Date: 06/01/2022  Expected End Date: 08/30/2022  Note:   Current Barriers:  Knowledge Deficits related to plan of care for management of Weight Loss  Financial Constraints.   RNCM Clinical Goal(s):  Patient will verbalize understanding of plan for management of Weight Loss as evidenced by patient reports take all medications exactly as prescribed and will call provider for medication  related questions as evidenced by patient reports    work with Education officer, museum to address Limited access to food related to the management of Weight Loss as evidenced by review of EMR and patient or Education officer, museum report     through collaboration with Consulting civil engineer, provider, and care team.   Interventions: Inter-disciplinary care team collaboration (see longitudinal plan of care) Evaluation of current treatment plan related to  self management and patient's adherence to plan as established by provider Referral to BSW for local food pantries-schedule on 06/08/22 @ 11:15 am Reviewed upcoming appointments including: 06/06/22  with Weight Management, 06/07/22 for AutoPap, 07/10/22 with Bryan Lemma, 07/25/22 with PCP, 08/29/21 with Rheumatology and 08/30/21 with [facility] Provided patient 1-800-QUIT-NOW for assistance with smoking cessation(vaping)   Weight Loss:  (Status: New goal.)  Provided verbal and/or written education to patient re: provider recommended life style modifications;  Reviewed recommended dietary changes: avoid fad diets, make small/incremental dietary and exercise changes, eat at the table and avoid eating in front of the TV, plan management of cravings, monitor snacking and cravings in food diary; Assessed social determinant of health barriers;  Reviewed provider notes from initial weight management visit Provided patient with education on exercises to do while sitting  Patient Goals/Self-Care Activities: Take medications as prescribed   Attend all scheduled provider appointments Call provider office for new concerns or questions  Work with the social worker to address care coordination needs and will continue to work with the clinical team to address health care and disease management related needs       Follow Up:  Patient agrees to Care Plan and Follow-up.  Plan: The Managed Medicaid care management team will reach out to the patient again over the next 30 days.  Date/time of next scheduled RN care management/care coordination outreach:  07/05/22 @ 11:15am  Lurena Joiner RN, BSN [place]  [employer] manager

## 2022-06-01 NOTE — Patient Instructions (Signed)
Visit Information  [name] was given information about Medicaid Managed Care team care coordination services as a part of their [facility] Medicaid benefit. [name] verbally consented to engagement with the [facility] Managed Care team.   If you are experiencing a medical emergency, please call 911 or report to your local emergency department or urgent care.   If you have a non-emergency medical problem during routine business hours, please contact your provider's office and ask to speak with a nurse.   For questions related to your [facility] health plan, please call: [phone] or go here:[email or web address]  If you would like to schedule transportation through your [facility] plan, please call the following number at least 2 days in advance of your appointment: [phone].  You can also use the MTM portal or MTM mobile app to manage your rides. For the portal, please go to [email or web address].  Call the [place] at [phone], at any time, 24 hours a day, 7 days a week. If you are in danger or need immediate medical attention call 911.  If you would like help to quit smoking, call 1-800-QUIT-NOW 380 775 1006) OR Espaol: 1-855-Djelo-Ya (9-381-829-9371) o para ms informacin haga clic aqu or Text READY to 200-400 to register via text  [name],   Please see education materials related to weight loss provided by MyChart link.  Patient verbalizes understanding of instructions and care plan provided today and agrees to view in [name]. Active MyChart status and patient understanding of how to access instructions and care plan via MyChart confirmed with patient.     Telephone follow up appointment with Managed Medicaid care management team member scheduled for:07/05/22 @ 11:15 am  Lurena Joiner RN, BSN [place] RN Care Coordinator   Following is a copy of your plan of care:  Care Plan : RN Care Manager  Plan of Care  Updates made by Kathryn Montane, RN since 06/01/2022 12:00 AM     Problem: Health Management needs related to Weight Management      Long-Range Goal: Development of Plan of Care to address Health Management needs related to Weight Management   Start Date: 06/01/2022  Expected End Date: 08/30/2022  Note:   Current Barriers:  Knowledge Deficits related to plan of care for management of Weight Loss  Financial Constraints.   RNCM Clinical Goal(s):  Patient will verbalize understanding of plan for management of Weight Loss as evidenced by patient reports take all medications exactly as prescribed and will call provider for medication related questions as evidenced by patient reports    work with Education officer, museum to address Limited access to food related to the management of Weight Loss as evidenced by review of EMR and patient or social worker report     through collaboration with Consulting civil engineer, provider, and care team.   Interventions: Inter-disciplinary care team collaboration (see longitudinal plan of care) Evaluation of current treatment plan related to  self management and patient's adherence to plan as established by provider Referral to BSW for local food pantries-schedule on 06/08/22 @ 11:15 am Reviewed upcoming appointments including: 06/06/22 with Weight Management, 06/07/22 for AutoPap, 07/10/22 with [name], 07/25/22 with PCP, 08/29/21 with Rheumatology and 08/30/21 with [facility] Provided patient 1-800-QUIT-NOW for assistance with smoking cessation(vaping)   Weight Loss:  (Status: New goal.)  Provided verbal and/or written education to patient re: provider recommended life style modifications;  Reviewed recommended dietary changes: avoid fad diets, make small/incremental dietary and  exercise changes, eat at the table and avoid eating in front of the TV, plan management of cravings, monitor snacking and cravings in food diary; Assessed social determinant of  health barriers;  Reviewed provider notes from initial weight management visit Provided patient with education on exercises to do while sitting  Patient Goals/Self-Care Activities: Take medications as prescribed   Attend all scheduled provider appointments Call provider office for new concerns or questions  Work with the social worker to address care coordination needs and will continue to work with the clinical team to address health care and disease management related needs

## 2022-06-05 DIAGNOSIS — F411 Generalized anxiety disorder: Secondary | ICD-10-CM | POA: Diagnosis not present

## 2022-06-05 NOTE — Progress Notes (Unsigned)
Chief Complaint:   OBESITY [name] (MR# [id number]) is a 31 y.o. adult who presents for evaluation and treatment of obesity and related comorbidities. Current BMI is Body mass index is 49.78 kg/m. [name] has been struggling with his weight for many years and has been unsuccessful in either losing weight, maintaining weight loss, or reaching his healthy weight goal.  [name] is currently in the action stage of change and ready to dedicate time achieving and maintaining a healthier weight. [name] is interested in becoming our patient and working on intensive lifestyle modifications including (but not limited to) diet and exercise for weight loss.  [name] is here as a new patient.  [name] had her information session on 05/09/2022.  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  [name] does not like to cook.  [name] craves sweets.  [name]'s habits were reviewed today and are as follows: {MWM WT HABITS:23461}.  Depression Screen [name]'s Food and Mood (modified PHQ-9) score was 20.     05/23/2022   10:56 AM  Depression screen PHQ 2/9  Decreased Interest 3  Down, Depressed, Hopeless 3  PHQ - 2 Score 6  Altered sleeping 3  Tired, decreased energy 3  Change in appetite 3  Feeling bad or failure about yourself  2  Trouble concentrating 1  Moving slowly or fidgety/restless 1  Suicidal thoughts 1  PHQ-9 Score 20  Difficult doing work/chores Extremely dIfficult   Subjective:   1. Other fatigue [name] admits to daytime somnolence and admits to waking up still tired. Patient has a history of symptoms of daytime fatigue, morning fatigue, and morning headache. [name] generally gets 4 or 6 hours of sleep per night, and states that [name] has nightime awakenings. Snoring is present. Apneic episodes are present. Epworth Sleepiness Score is 10.   2. SOB (shortness of breath) on exertion [name] notes increasing shortness of breath with exercising and seems to be  worsening over time with weight gain. [name] notes getting out of breath sooner with activity than [name] used to. This has not gotten worse recently. [name] denies shortness of breath at rest or orthopnea.  3. Acquired hypothyroidism ***  4. Vitamin D deficiency ***  5. Health care maintenance ***  6. Mixed hyperlipidemia ***  7. Metabolic syndrome ***  8. Vitamin B12 deficiency ***  Assessment/Plan:   1. Other fatigue [name] does feel that his weight is causing his energy to be lower than it should be. Fatigue may be related to obesity, depression or many other causes. Labs will be ordered, and in the meanwhile, [name] will focus on self care including making healthy food choices, increasing physical activity and focusing on stress reduction.  - Hemoglobin A1c - Insulin, random - Vitamin B12  2. SOB (shortness of breath) on exertion [name] does feel that [name] gets out of breath more easily that [name] used to when [name] exercises. [name]'s shortness of breath appears to be obesity related and exercise induced. [name] has agreed to work on weight loss and gradually increase exercise to treat his exercise induced shortness of breath. Will continue to monitor closely.  3. Acquired hypothyroidism ***  4. Vitamin D deficiency ***  5. Health care maintenance ***  6. Mixed hyperlipidemia ***  7. Metabolic syndrome *** - Hemoglobin A1c - Insulin, random  8. Vitamin B12 deficiency *** - Vitamin B12  9. Depression screening [name] had a positive depression screening. Depression is commonly associated with obesity and often results in  emotional eating behaviors. We will monitor this closely and work on CBT to help improve the non-hunger eating patterns. Referral to Psychology may be required if no improvement is seen as [name] continues in our clinic.  10. Obesity, Current BMI 49.9 [name] is currently in the action stage of change and his goal is to continue with weight loss  efforts. I recommend [name] begin the structured treatment plan as follows:  [name] has agreed to the Category 4 Plan -100 calories.  Mindful eating and intentional eating were discussed.  [name] is to eliminate soda and not skip meals.  Protein shake handout was given.  Exercise goals: No exercise has been prescribed at this time.   Behavioral modification strategies: increasing lean protein intake, decreasing simple carbohydrates, increasing vegetables, increasing water intake, decreasing eating out, no skipping meals, meal planning and cooking strategies, keeping healthy foods in the home, and planning for success.  [name] was informed of the importance of frequent follow-up visits to maximize his success with intensive lifestyle modifications for his multiple health conditions. [name] was informed we would discuss his lab results at his next visit unless there is a critical issue that needs to be addressed sooner. [name] agreed to keep his next visit at the agreed upon time to discuss these results.  Objective:   Blood pressure 126/74, pulse (!) 105, temperature 98.1 F (36.7 C), height 5\' 3"  (1.6 m), weight 281 lb (127.5 kg), SpO2 96 %. Body mass index is 49.78 kg/m.  EKG: Normal sinus rhythm, rate (unable to maintain).  Indirect Calorimeter completed today shows a VO2 of 320 and a REE of 2203.  His calculated basal metabolic rate is 8786 thus his basal metabolic rate is worse than expected.  General: Cooperative, alert, well developed, in no acute distress. HEENT: Conjunctivae and lids unremarkable. Cardiovascular: Regular rhythm.  Lungs: Normal work of breathing. Neurologic: No focal deficits.   Lab Results  Component Value Date   CREATININE 0.91 04/25/2022   BUN 11 04/25/2022   NA 138 04/25/2022   K 3.8 04/25/2022   CL 104 04/25/2022   CO2 19 (L) 04/25/2022   Lab Results  Component Value Date   ALT 51 (H) 04/25/2022   AST 28 04/25/2022   ALKPHOS 95 04/25/2022   BILITOT 0.3  04/25/2022   Lab Results  Component Value Date   HGBA1C 5.5 05/23/2022   HGBA1C 4.9 03/06/2022   HGBA1C 4.9 01/04/2022   HGBA1C 5.1 05/03/2019   Lab Results  Component Value Date   INSULIN 16.4 05/23/2022   Lab Results  Component Value Date   TSH 2.130 04/25/2022   Lab Results  Component Value Date   CHOL 172 04/25/2022   HDL 35 (L) 04/25/2022   LDLCALC 106 (H) 04/25/2022   TRIG 178 (H) 04/25/2022   CHOLHDL 4.9 (H) 04/25/2022   Lab Results  Component Value Date   WBC 9.3 04/25/2022   HGB 13.0 04/25/2022   HCT 39.7 04/25/2022   MCV 87 04/25/2022   PLT 306 04/25/2022   Lab Results  Component Value Date   FERRITIN 42 12/25/2021   Attestation Statements:   Reviewed by clinician on day of visit: allergies, medications, problem list, medical history, surgical history, family history, social history, and previous encounter notes.   Wilhemena Durie, am acting as Location manager for [employer], DO.  I have reviewed the above documentation for accuracy and completeness, and I agree with the above. - ***

## 2022-06-06 ENCOUNTER — Encounter: Payer: Self-pay | Admitting: Bariatrics

## 2022-06-06 ENCOUNTER — Ambulatory Visit (INDEPENDENT_AMBULATORY_CARE_PROVIDER_SITE_OTHER): Payer: Medicaid Other | Admitting: Bariatrics

## 2022-06-06 ENCOUNTER — Ambulatory Visit: Payer: Medicaid Other

## 2022-06-06 VITALS — BP 126/71 | HR 126 | Temp 98.2°F | Ht 63.0 in | Wt 279.0 lb

## 2022-06-06 DIAGNOSIS — Z87898 Personal history of other specified conditions: Secondary | ICD-10-CM

## 2022-06-06 DIAGNOSIS — E88819 Insulin resistance, unspecified: Secondary | ICD-10-CM | POA: Diagnosis not present

## 2022-06-06 DIAGNOSIS — R7401 Elevation of levels of liver transaminase levels: Secondary | ICD-10-CM

## 2022-06-06 DIAGNOSIS — E669 Obesity, unspecified: Secondary | ICD-10-CM

## 2022-06-06 DIAGNOSIS — E7849 Other hyperlipidemia: Secondary | ICD-10-CM

## 2022-06-06 DIAGNOSIS — E559 Vitamin D deficiency, unspecified: Secondary | ICD-10-CM

## 2022-06-06 DIAGNOSIS — Z6841 Body Mass Index (BMI) 40.0 and over, adult: Secondary | ICD-10-CM

## 2022-06-08 ENCOUNTER — Encounter: Payer: Self-pay | Admitting: Bariatrics

## 2022-06-08 ENCOUNTER — Other Ambulatory Visit: Payer: Medicaid Other

## 2022-06-08 ENCOUNTER — Telehealth: Payer: Self-pay | Admitting: Neurology

## 2022-06-08 NOTE — Patient Outreach (Signed)
  Medicaid Managed Care   Unsuccessful Outreach Note  06/08/2022 Name: [name] MRN: [id number] DOB: 11/14/1990  Referred by: Rakes, Linda M, FNP Reason for referral : High Risk Managed Medicaid (MM Social work telephone outreach)   An unsuccessful telephone outreach was attempted today. The patient was referred to the case management team for assistance with care management and care coordination.   Follow Up Plan: The care management team will reach out to the patient again over the next 7 days.   [name], BSW, MHA [facility]  [place]  High Risk Managed Medicaid Team  [phone]  

## 2022-06-08 NOTE — Patient Instructions (Signed)
  Medicaid Managed Care   Unsuccessful Outreach Note  06/08/2022 Name: Tabithia Stroder Willhelm MRN: [id number] DOB: Apr 28, 1991  Referred by: Sonny Masters, FNP Reason for referral : High Risk Managed Medicaid (MM Social work telephone outreach)   An unsuccessful telephone outreach was attempted today. The patient was referred to the case management team for assistance with care management and care coordination.   Follow Up Plan: The care management team will reach out to the patient again over the next 7 days.   Gus Puma, BSW, [place] [facility]  [place]  High Risk Managed Medicaid Team  [phone]

## 2022-06-08 NOTE — Telephone Encounter (Signed)
Called pt to reschedule appointment with Dr. Frances Furbish. Left a message to return call to reschedule appointment.

## 2022-06-13 ENCOUNTER — Telehealth: Payer: Self-pay | Admitting: Neurology

## 2022-06-13 NOTE — Telephone Encounter (Signed)
Called pt and was prepared to  LVM stating that he is needing to schedule his Initial Cpap visit but vm is not set up. DME and between dates are 12/9-02/08/24

## 2022-06-15 ENCOUNTER — Encounter: Payer: Self-pay | Admitting: Family Medicine

## 2022-06-15 ENCOUNTER — Ambulatory Visit: Payer: Medicaid Other | Admitting: Family Medicine

## 2022-06-15 NOTE — Telephone Encounter (Signed)
Pt was scheduled Initial CPAP visit on 08/30/22 Pt informed to bring machine and power cord to appt.  DME: [facility] Ph: [phone] Fax: [phone] Set up date 06-07-2022 Airsense 10 AirFit p30i med

## 2022-06-19 ENCOUNTER — Encounter: Payer: Self-pay | Admitting: Bariatrics

## 2022-06-19 NOTE — Progress Notes (Signed)
Chief Complaint:   OBESITY [name] is here to discuss his progress with his obesity treatment plan along with follow-up of his obesity related diagnoses. [name] is on the Category 4 Plan -100 calories and states he is following his eating plan approximately 50% of the time. [name] states he is walking for 10 minutes 3 times per week.  Today's visit was #: 2 Starting weight: 281 lbs Starting date: 05/23/2022 Today's weight: 279 lbs Today's date: 06/06/2022 Total lbs lost to date: 2 Total lbs lost since last in-office visit: 2  Interim History: [name]'s weight is down 2 lbs since his first visit. He is eating more protein. He is back on testosterone injection 200 mg once weekly. He is snacking less.   Subjective:   1. Vitamin D deficiency [name] is taking Vitamin D, and his recent Vitamin D level is 17.8.  2. Other hyperlipidemia [name] is Taking Crestor.   3. History of tachycardia [name] has Beta blocker prescribed.   4. Elevated ALT measurement [name] is not critical.   5. Insulin resistance [name]'s insulin level is 16.4. His goal is <10  Assessment/Plan:   1. Vitamin D deficiency [name] will continue prescription Vitamin D 50,000 IU every week and will follow-up for routine testing of Vitamin D, at least 2-3 times per year to avoid over-replacement.  2. Other hyperlipidemia [name] will continue Crestor. Healthy fats versus Unhealthy fats were discussed and handout was provided.   3. History of tachycardia [name] will resume his medications. He will start on his auto-pap.   4. Elevated ALT measurement We will follow-up over time.   5. Insulin resistance Handouts on prediabetes and insulin resistance were given to the patient.   6. Obesity, Current BMI 49.6 [name] is currently in the action stage of change. As such, his goal is to continue with weight loss efforts. He has agreed to the Category 4 Plan.   Reviewed labs with the  patient from 04/25/2022, CMP, lipid, Vitamin D, CBC, glucose, and thyroid panel.   Exercise goals: As is.   Behavioral modification strategies: increasing lean protein intake, decreasing simple carbohydrates, increasing vegetables, increasing water intake, decreasing eating out, no skipping meals, meal planning and cooking strategies, keeping healthy foods in the home, and planning for success.  [name] has agreed to follow-up with our clinic in 2 weeks. He was informed of the importance of frequent follow-up visits to maximize his success with intensive lifestyle modifications for his multiple health conditions.   Objective:   Blood pressure 126/71, pulse (!) 126, temperature 98.2 F (36.8 C), height 5\' 3"  (1.6 m), weight 279 lb (126.6 kg), SpO2 98 %. Body mass index is 49.42 kg/m.  General: Cooperative, alert, well developed, in no acute distress. HEENT: Conjunctivae and lids unremarkable. Cardiovascular: Regular rhythm.  Lungs: Normal work of breathing. Neurologic: No focal deficits.   Lab Results  Component Value Date   CREATININE 0.91 04/25/2022   BUN 11 04/25/2022   NA 138 04/25/2022   K 3.8 04/25/2022   CL 104 04/25/2022   CO2 19 (L) 04/25/2022   Lab Results  Component Value Date   ALT 51 (H) 04/25/2022   AST 28 04/25/2022   ALKPHOS 95 04/25/2022   BILITOT 0.3 04/25/2022   Lab Results  Component Value Date   HGBA1C 5.5 05/23/2022   HGBA1C 4.9 03/06/2022   HGBA1C 4.9 01/04/2022   HGBA1C 5.1 05/03/2019   Lab Results  Component Value Date   INSULIN 16.4 05/23/2022   Lab  Results  Component Value Date   TSH 2.130 04/25/2022   Lab Results  Component Value Date   CHOL 172 04/25/2022   HDL 35 (L) 04/25/2022   LDLCALC 106 (H) 04/25/2022   TRIG 178 (H) 04/25/2022   CHOLHDL 4.9 (H) 04/25/2022   Lab Results  Component Value Date   VD25OH 17.8 (L) 04/25/2022   VD25OH 10.0 (L) 01/04/2022   Lab Results  Component Value Date   WBC 9.3 04/25/2022   HGB 13.0  04/25/2022   HCT 39.7 04/25/2022   MCV 87 04/25/2022   PLT 306 04/25/2022   Lab Results  Component Value Date   FERRITIN 42 12/25/2021   Attestation Statements:   Reviewed by clinician on day of visit: allergies, medications, problem list, medical history, surgical history, family history, social history, and previous encounter notes.   Trude Mcburney, am acting as Energy manager for [employer], DO.  I have reviewed the above documentation for accuracy and completeness, and I agree with the above. Corinna Capra, DO

## 2022-06-20 ENCOUNTER — Encounter: Payer: Self-pay | Admitting: Bariatrics

## 2022-06-20 ENCOUNTER — Ambulatory Visit: Payer: Medicaid Other | Admitting: Bariatrics

## 2022-06-20 VITALS — BP 134/79 | HR 92 | Temp 97.9°F | Ht 63.0 in | Wt 285.0 lb

## 2022-06-20 DIAGNOSIS — E039 Hypothyroidism, unspecified: Secondary | ICD-10-CM | POA: Diagnosis not present

## 2022-06-20 DIAGNOSIS — Z6841 Body Mass Index (BMI) 40.0 and over, adult: Secondary | ICD-10-CM

## 2022-06-20 DIAGNOSIS — E669 Obesity, unspecified: Secondary | ICD-10-CM

## 2022-06-20 DIAGNOSIS — E786 Lipoprotein deficiency: Secondary | ICD-10-CM

## 2022-06-27 ENCOUNTER — Other Ambulatory Visit: Payer: Self-pay

## 2022-06-27 MED ORDER — ROSUVASTATIN CALCIUM 5 MG PO TABS: 5.0000 mg | ORAL_TABLET | Freq: Every day | ORAL | 0 refills | Status: DC

## 2022-06-27 MED ORDER — VITAMIN D (ERGOCALCIFEROL) 1.25 MG (50000 UNIT) PO CAPS: 50000.0000 [IU] | ORAL_CAPSULE | ORAL | 0 refills | Status: DC

## 2022-06-27 NOTE — Telephone Encounter (Signed)
  Prescription Request  06/27/2022  Is this a "Controlled Substance" medicine?  Vitamin D, Ergocalciferol, (DRISDOL) 1.25 MG (50000 UNIT) CAPS capsule  rosuvastatin (CRESTOR) 5 MG tablet    Have you seen your PCP in the last 2 weeks? No. Pt has been discharged from practice due to no show. She is asking for refills till apt per discharge letter  If YES, route message to pool  -  If NO, patient needs to be scheduled for appointment.  What is the name of the medication or equipment?  Vitamin D, Ergocalciferol, (DRISDOL) 1.25 MG (50000 UNIT) CAPS capsule  rosuvastatin (CRESTOR) 5 MG tablet   Have you contacted your pharmacy to request a refill? no   Which pharmacy would you like this sent to? [facility]   Patient notified that their request is being sent to the clinical staff for review and that they should receive a response within 2 business days.

## 2022-06-27 NOTE — Telephone Encounter (Signed)
Please advise on refill requests. Dismissal letter was dated 06/15/22

## 2022-06-28 NOTE — Telephone Encounter (Signed)
LMOVM refills sent to pharmacy °

## 2022-07-03 ENCOUNTER — Encounter: Payer: Self-pay | Admitting: Bariatrics

## 2022-07-03 NOTE — Progress Notes (Signed)
Chief Complaint:   OBESITY [name] is here to discuss his progress with his obesity treatment plan along with follow-up of his obesity related diagnoses. [name] is on the Category 4 Plan and states [name] is following his eating plan approximately 5% of the time. [name] states [name] is walking for 10 minutes 3 times per week.  Today's visit was #: 3 Starting weight: 281 lbs Starting date: 05/23/2022 Today's weight: 285 lbs Today's date: 06/20/2022 Total lbs lost to date: 0 Total lbs lost since last in-office visit: 0  Interim History: [name] is up in water weight per our bioimpedance scale, but not in fat mass. [name] ate more rice last week.   Subjective:   1. Acquired hypothyroidism [name] is taking Synthroid.   2. Low HDL (under 40) [name] is working on his diet and exercise.   Assessment/Plan:   1. Acquired hypothyroidism [name] will continue Synthroid as directed.   2. Low HDL (under 40) Cedricka will work on decreasing his triglycerides and decrease carbohydrates, and increase activity.   3. Obesity, Current BMI 50.6 [name] is currently in the action stage of change. As such, his goal is to continue with weight loss efforts. [name] has agreed to following a lower carbohydrate, vegetable and lean protein rich diet plan.   Meal planning and intentional eating were discussed.  [name] is to cut his salt intake and decrease carbohydrates.  Exercise goals: As is.   Behavioral modification strategies: increasing lean protein intake, decreasing simple carbohydrates, increasing vegetables, increasing water intake, decreasing eating out, no skipping meals, meal planning and cooking strategies, keeping healthy foods in the home, and planning for success.  [name] has agreed to follow-up with our clinic in 3 weeks. [name] was informed of the importance of frequent follow-up visits to maximize his success with intensive lifestyle modifications for his multiple health conditions.    Objective:   Blood pressure 134/79, pulse 92, temperature 97.9 F (36.6 C), height 5\' 3"  (1.6 m), weight 285 lb (129.3 kg), SpO2 98 %. Body mass index is 50.49 kg/m.  General: Cooperative, alert, well developed, in no acute distress. HEENT: Conjunctivae and lids unremarkable. Cardiovascular: Regular rhythm.  Lungs: Normal work of breathing. Neurologic: No focal deficits.   Lab Results  Component Value Date   CREATININE 0.91 04/25/2022   BUN 11 04/25/2022   NA 138 04/25/2022   K 3.8 04/25/2022   CL 104 04/25/2022   CO2 19 (L) 04/25/2022   Lab Results  Component Value Date   ALT 51 (H) 04/25/2022   AST 28 04/25/2022   ALKPHOS 95 04/25/2022   BILITOT 0.3 04/25/2022   Lab Results  Component Value Date   HGBA1C 5.5 05/23/2022   HGBA1C 4.9 03/06/2022   HGBA1C 4.9 01/04/2022   HGBA1C 5.1 05/03/2019   Lab Results  Component Value Date   INSULIN 16.4 05/23/2022   Lab Results  Component Value Date   TSH 2.130 04/25/2022   Lab Results  Component Value Date   CHOL 172 04/25/2022   HDL 35 (L) 04/25/2022   LDLCALC 106 (H) 04/25/2022   TRIG 178 (H) 04/25/2022   CHOLHDL 4.9 (H) 04/25/2022   Lab Results  Component Value Date   VD25OH 17.8 (L) 04/25/2022   VD25OH 10.0 (L) 01/04/2022   Lab Results  Component Value Date   WBC 9.3 04/25/2022   HGB 13.0 04/25/2022   HCT 39.7 04/25/2022   MCV 87 04/25/2022   PLT 306 04/25/2022   Lab Results  Component  Value Date   FERRITIN 42 12/25/2021   Attestation Statements:   Reviewed by clinician on day of visit: allergies, medications, problem list, medical history, surgical history, family history, social history, and previous encounter notes.   Trude Mcburney, am acting as Energy manager for [employer], DO.  I have reviewed the above documentation for accuracy and completeness, and I agree with the above. Corinna Capra, DO

## 2022-07-05 ENCOUNTER — Other Ambulatory Visit: Payer: Medicaid Other | Admitting: *Deleted

## 2022-07-05 NOTE — Patient Instructions (Signed)
Visit Information  Mr. [name]  - as a part of your Medicaid benefit, you are eligible for care management and care coordination services at no cost or copay. I was unable to reach you by phone today but would be happy to help you with your health related needs. Please feel free to call me @ [phone].   A member of the Managed Medicaid care management team will reach out to you again over the next 14 days.   Estanislado Emms RN, BSN [place]  [facility]

## 2022-07-05 NOTE — Patient Outreach (Signed)
  Medicaid Managed Care   Unsuccessful Attempt Note   07/05/2022 Name: [name] MRN: [id number] DOB: 1990/09/19  Referred by: No primary care provider on file. Reason for referral : High Risk Managed Medicaid (Unsuccessful RNCM follow up telephone outreach)   An unsuccessful telephone outreach was attempted today. The patient was referred to the case management team for assistance with care management and care coordination.    Follow Up Plan: A HIPAA compliant phone message was left for the patient providing contact information and requesting a return call. and The Managed Medicaid care management team will reach out to the patient again over the next 14 days.    Estanislado Emms RN, BSN [place]  [facility]

## 2022-07-11 ENCOUNTER — Ambulatory Visit: Payer: Medicaid Other | Admitting: Bariatrics

## 2022-07-18 ENCOUNTER — Encounter: Payer: Self-pay | Admitting: Bariatrics

## 2022-07-18 ENCOUNTER — Ambulatory Visit: Payer: Medicaid Other | Admitting: Bariatrics

## 2022-07-18 VITALS — BP 123/82 | HR 93 | Temp 98.2°F | Ht 63.0 in | Wt 283.0 lb

## 2022-07-18 DIAGNOSIS — E88819 Insulin resistance, unspecified: Secondary | ICD-10-CM

## 2022-07-18 DIAGNOSIS — Z6841 Body Mass Index (BMI) 40.0 and over, adult: Secondary | ICD-10-CM | POA: Diagnosis not present

## 2022-07-18 DIAGNOSIS — E038 Other specified hypothyroidism: Secondary | ICD-10-CM | POA: Diagnosis not present

## 2022-07-18 DIAGNOSIS — E669 Obesity, unspecified: Secondary | ICD-10-CM | POA: Diagnosis not present

## 2022-07-18 MED ORDER — LEVOTHYROXINE SODIUM 25 MCG PO TABS: 25.0000 ug | ORAL_TABLET | Freq: Every day | ORAL | 0 refills | Status: DC

## 2022-07-25 ENCOUNTER — Ambulatory Visit: Payer: Medicaid Other | Admitting: Family Medicine

## 2022-07-26 ENCOUNTER — Other Ambulatory Visit: Payer: Medicaid Other | Admitting: *Deleted

## 2022-07-26 NOTE — Patient Outreach (Cosign Needed Addendum)
  Medicaid Managed Care   Unsuccessful Attempt Note   07/26/2022 Name: [name] MRN: [id number] DOB: [birth year]  Referred by: No primary care provider on file. Reason for referral : High Risk Managed Medicaid (Unsuccessful RNCM telephone follow up outreach)   Third unsuccessful telephone outreach was attempted today. The patient was referred to the case management team for assistance with care management and care coordination. The patient's primary care provider has been notified of our unsuccessful attempts to make or maintain contact with the patient. The care management team is pleased to engage with this patient at any time in the future should he/she be interested in assistance from the care management team.    Follow Up Plan: The  Patient has been provided with contact information for the Managed Medicaid care management team and has been advised to call with any health related questions or concerns.    Estanislado Emms RN, BSN [place]  [facility]

## 2022-07-27 ENCOUNTER — Ambulatory Visit: Payer: Medicaid Other | Admitting: Neurology

## 2022-08-08 ENCOUNTER — Ambulatory Visit: Payer: Medicaid Other | Admitting: Nurse Practitioner

## 2022-08-08 ENCOUNTER — Encounter: Payer: Self-pay | Admitting: Nurse Practitioner

## 2022-08-08 ENCOUNTER — Encounter: Payer: Self-pay | Admitting: Bariatrics

## 2022-08-08 VITALS — BP 135/87 | HR 100 | Temp 98.4°F | Ht 63.0 in | Wt 283.0 lb

## 2022-08-08 DIAGNOSIS — E669 Obesity, unspecified: Secondary | ICD-10-CM

## 2022-08-08 DIAGNOSIS — E782 Mixed hyperlipidemia: Secondary | ICD-10-CM

## 2022-08-08 DIAGNOSIS — Z6841 Body Mass Index (BMI) 40.0 and over, adult: Secondary | ICD-10-CM

## 2022-08-08 DIAGNOSIS — E559 Vitamin D deficiency, unspecified: Secondary | ICD-10-CM | POA: Diagnosis not present

## 2022-08-08 MED ORDER — VITAMIN D (ERGOCALCIFEROL) 1.25 MG (50000 UNIT) PO CAPS: 50000.0000 [IU] | ORAL_CAPSULE | ORAL | 0 refills | Status: DC

## 2022-08-08 NOTE — Progress Notes (Signed)
Chief Complaint:   OBESITY [name] is here to discuss his progress with his obesity treatment plan along with follow-up of his obesity related diagnoses. [name] is on following a lower carbohydrate, vegetable and lean protein rich diet plan and states he is following his eating plan approximately 50% of the time. [name] states he is walking for 10 minutes 3-4 times per week.  Today's visit was #: 4 Starting weight: 281 lbs Starting date: 05/23/2022 Today's weight: 283 lbs Today's date: 07/18/2022 Total lbs lost to date: 0 Total lbs lost since last in-office visit: 2  Interim History: [name] is down 2 pounds since his last visit.  He cut out fast food for the most part and he is cooking at home.  Subjective:   1. Insulin resistance [name] is currently taking metformin.  2. Other specified hypothyroidism [name] is taking levothyroxine as directed.  Assessment/Plan:   1. Insulin resistance [name] will continue metformin as directed.   2. Other specified hypothyroidism [name] will continue levothyroxine 25 mcg once daily, and we will refill for 1 month.   - levothyroxine (SYNTHROID) 25 MCG tablet; Take 1 tablet (25 mcg total) by mouth daily.  Dispense: 30 tablet; Refill: 0  3. Obesity, Current BMI 50.1 [name] is currently in the action stage of change. As such, his goal is to continue with weight loss efforts. He has agreed to practicing portion control and making smarter food choices, such as increasing vegetables and decreasing simple carbohydrates. (1700-1800 calories daily).  Meal planning and intentional eating were discussed.  He will work on increasing his water intake, and restart journaling.  Exercise goals: As is, increase duration to 20 minutes.  Behavioral modification strategies: increasing lean protein intake, decreasing simple carbohydrates, increasing vegetables, increasing water intake, decreasing eating out, no skipping meals, meal  planning and cooking strategies, keeping healthy foods in the home, and planning for success.  [name] has agreed to follow-up with our clinic in 2 to 3 weeks. He was informed of the importance of frequent follow-up visits to maximize his success with intensive lifestyle modifications for his multiple health conditions.   Objective:   Blood pressure 123/82, pulse 93, temperature 98.2 F (36.8 C), height 5\' 3"  (1.6 m), weight 283 lb (128.4 kg), SpO2 97 %. Body mass index is 50.13 kg/m.  General: Cooperative, alert, well developed, in no acute distress. HEENT: Conjunctivae and lids unremarkable. Cardiovascular: Regular rhythm.  Lungs: Normal work of breathing. Neurologic: No focal deficits.   Lab Results  Component Value Date   CREATININE 0.91 04/25/2022   BUN 11 04/25/2022   NA 138 04/25/2022   K 3.8 04/25/2022   CL 104 04/25/2022   CO2 19 (L) 04/25/2022   Lab Results  Component Value Date   ALT 51 (H) 04/25/2022   AST 28 04/25/2022   ALKPHOS 95 04/25/2022   BILITOT 0.3 04/25/2022   Lab Results  Component Value Date   HGBA1C 5.5 05/23/2022   HGBA1C 4.9 03/06/2022   HGBA1C 4.9 01/04/2022   HGBA1C 5.1 05/03/2019   Lab Results  Component Value Date   INSULIN 16.4 05/23/2022   Lab Results  Component Value Date   TSH 2.130 04/25/2022   Lab Results  Component Value Date   CHOL 172 04/25/2022   HDL 35 (L) 04/25/2022   LDLCALC 106 (H) 04/25/2022   TRIG 178 (H) 04/25/2022   CHOLHDL 4.9 (H) 04/25/2022   Lab Results  Component Value Date   VD25OH 17.8 (L) 04/25/2022  VD25OH 10.0 (L) 01/04/2022   Lab Results  Component Value Date   WBC 9.3 04/25/2022   HGB 13.0 04/25/2022   HCT 39.7 04/25/2022   MCV 87 04/25/2022   PLT 306 04/25/2022   Lab Results  Component Value Date   FERRITIN 42 12/25/2021   Attestation Statements:   Reviewed by clinician on day of visit: allergies, medications, problem list, medical history, surgical history, family history, social  history, and previous encounter notes.   Trude Mcburney, am acting as Energy manager for [employer], DO.  I have reviewed the above documentation for accuracy and completeness, and I agree with the above. Corinna Capra, DO

## 2022-08-08 NOTE — Patient Instructions (Signed)

## 2022-08-15 NOTE — Progress Notes (Signed)
Chief Complaint:   OBESITY [name] is here to discuss his progress with his obesity treatment plan along with follow-up of his obesity related diagnoses. [name] is on following a lower carbohydrate, vegetable and lean protein rich diet plan and states he is following his eating plan approximately 40% of the time. [name] states he is walking 10 minutes 3-4 times per week.  Today's visit was #: 5 Starting weight: 281 lbs Starting date: 05/23/2022 Today's weight: 283 lbs Today's date: 08/08/2022 Total lbs lost to date: 0 lbs Total lbs lost since last in-office visit: 0  Interim History: [name] has maintained weight since last visit.  Has some stress at home.  Denies stress eating.  Struggling with craving sweets and drinking enough water.  Not getting in enough protein.  Goal is to lose weight to have hip surgery.  Subjective:   1. Mixed hyperlipidemia Taking Crestor 5 mg daily.  Denies any side effects.  Not taking on the regular basis.   2. Vitamin D deficiency Has not been taking over the past month.  Denies any side effects while taking.  Assessment/Plan:   1. Mixed hyperlipidemia Take medication as directed.  Cardiovascular risk and specific lipid/LDL goals reviewed.  We discussed several lifestyle modifications today and [name] will continue to work on diet, exercise and weight loss efforts. Orders and follow up as documented in patient record.   Counseling Intensive lifestyle modifications are the first line treatment for this issue. Dietary changes: Increase soluble fiber. Decrease simple carbohydrates. Exercise changes: Moderate to vigorous-intensity aerobic activity 150 minutes per week if tolerated. Lipid-lowering medications: see documented in medical record.   2. Vitamin D deficiency We will refill Vit D 50K IU once a week for 1 month with 0 refills.  Side effects discussed.   -Refill Vitamin D, Ergocalciferol, (DRISDOL) 1.25 MG (50000 UNIT) CAPS  capsule; Take 1 capsule (50,000 Units total) by mouth every 7 (seven) days.  Dispense: 5 capsule; Refill: 0  3. Obesity, Current BMI 50.2 [name] is currently in the action stage of change. As such, his goal is to continue with weight loss efforts. He has agreed to following a lower carbohydrate, vegetable and lean protein rich diet plan.   Goal: Increase water and walking.  Exercise goals: All adults should avoid inactivity. Some physical activity is better than none, and adults who participate in any amount of physical activity gain some health benefits.  Behavioral modification strategies: increasing lean protein intake, increasing water intake, and no skipping meals.  [name] has agreed to follow-up with our clinic in 2 weeks. He was informed of the importance of frequent follow-up visits to maximize his success with intensive lifestyle modifications for his multiple health conditions.   Objective:   Blood pressure 135/87, pulse 100, temperature 98.4 F (36.9 C), height 5\' 3"  (1.6 m), weight 283 lb (128.4 kg), SpO2 96 %. Body mass index is 50.13 kg/m.  General: Cooperative, alert, well developed, in no acute distress. HEENT: Conjunctivae and lids unremarkable. Cardiovascular: Regular rhythm.  Lungs: Normal work of breathing. Neurologic: No focal deficits.   Lab Results  Component Value Date   CREATININE 0.91 04/25/2022   BUN 11 04/25/2022   NA 138 04/25/2022   K 3.8 04/25/2022   CL 104 04/25/2022   CO2 19 (L) 04/25/2022   Lab Results  Component Value Date   ALT 51 (H) 04/25/2022   AST 28 04/25/2022   ALKPHOS 95 04/25/2022   BILITOT 0.3 04/25/2022   Lab Results  Component Value Date   HGBA1C 5.5 05/23/2022   HGBA1C 4.9 03/06/2022   HGBA1C 4.9 01/04/2022   HGBA1C 5.1 05/03/2019   Lab Results  Component Value Date   INSULIN 16.4 05/23/2022   Lab Results  Component Value Date   TSH 2.130 04/25/2022   Lab Results  Component Value Date   CHOL 172 04/25/2022    HDL 35 (L) 04/25/2022   LDLCALC 106 (H) 04/25/2022   TRIG 178 (H) 04/25/2022   CHOLHDL 4.9 (H) 04/25/2022   Lab Results  Component Value Date   VD25OH 17.8 (L) 04/25/2022   VD25OH 10.0 (L) 01/04/2022   Lab Results  Component Value Date   WBC 9.3 04/25/2022   HGB 13.0 04/25/2022   HCT 39.7 04/25/2022   MCV 87 04/25/2022   PLT 306 04/25/2022   Lab Results  Component Value Date   FERRITIN 42 12/25/2021   Attestation Statements:   Reviewed by clinician on day of visit: allergies, medications, problem list, medical history, surgical history, family history, social history, and previous encounter notes.  I, Brendell Tyus, RMA, am acting as transcriptionist for Everardo Pacific, FNP.  I have reviewed the above documentation for accuracy and completeness, and I agree with the above. Everardo Pacific, FNP

## 2022-08-22 ENCOUNTER — Ambulatory Visit: Payer: Medicaid Other | Admitting: Bariatrics

## 2022-08-22 ENCOUNTER — Encounter: Payer: Self-pay | Admitting: Bariatrics

## 2022-08-22 VITALS — BP 134/85 | HR 103 | Temp 98.0°F | Ht 63.0 in | Wt 283.0 lb

## 2022-08-22 DIAGNOSIS — E559 Vitamin D deficiency, unspecified: Secondary | ICD-10-CM

## 2022-08-22 DIAGNOSIS — E88819 Insulin resistance, unspecified: Secondary | ICD-10-CM

## 2022-08-22 DIAGNOSIS — F5089 Other specified eating disorder: Secondary | ICD-10-CM

## 2022-08-22 DIAGNOSIS — E7800 Pure hypercholesterolemia, unspecified: Secondary | ICD-10-CM | POA: Diagnosis not present

## 2022-08-22 DIAGNOSIS — E038 Other specified hypothyroidism: Secondary | ICD-10-CM | POA: Diagnosis not present

## 2022-08-22 DIAGNOSIS — Z6841 Body Mass Index (BMI) 40.0 and over, adult: Secondary | ICD-10-CM

## 2022-08-22 DIAGNOSIS — E669 Obesity, unspecified: Secondary | ICD-10-CM

## 2022-08-22 MED ORDER — LEVOTHYROXINE SODIUM 25 MCG PO TABS: 25.0000 ug | ORAL_TABLET | Freq: Every day | ORAL | 0 refills | Status: DC

## 2022-08-22 MED ORDER — VITAMIN D (ERGOCALCIFEROL) 1.25 MG (50000 UNIT) PO CAPS: 50000.0000 [IU] | ORAL_CAPSULE | ORAL | 0 refills | Status: DC

## 2022-08-23 LAB — COMPREHENSIVE METABOLIC PANEL
ALT: 45 IU/L — ABNORMAL HIGH (ref 0–32)
AST: 27 IU/L (ref 0–40)
Albumin/Globulin Ratio: 1.3 (ref 1.2–2.2)
Albumin: 4.2 g/dL (ref 3.9–4.9)
Alkaline Phosphatase: 84 IU/L (ref 44–121)
BUN/Creatinine Ratio: 10 (ref 9–23)
BUN: 9 mg/dL (ref 6–20)
Bilirubin Total: 0.5 mg/dL (ref 0.0–1.2)
CO2: 21 mmol/L (ref 20–29)
Calcium: 9.2 mg/dL (ref 8.7–10.2)
Chloride: 105 mmol/L (ref 96–106)
Creatinine, Ser: 0.93 mg/dL (ref 0.57–1.00)
Globulin, Total: 3.3 g/dL (ref 1.5–4.5)
Glucose: 77 mg/dL (ref 70–99)
Potassium: 4.3 mmol/L (ref 3.5–5.2)
Sodium: 142 mmol/L (ref 134–144)
Total Protein: 7.5 g/dL (ref 6.0–8.5)
eGFR: 84 mL/min/{1.73_m2} (ref >59)

## 2022-08-23 LAB — LIPID PANEL WITH LDL/HDL RATIO
Cholesterol, Total: 171 mg/dL (ref 100–199)
HDL: 42 mg/dL (ref >39)
LDL Chol Calc (NIH): 107 mg/dL — ABNORMAL HIGH (ref 0–99)
LDL/HDL Ratio: 2.5 ratio (ref 0.0–3.2)
Triglycerides: 125 mg/dL (ref 0–149)
VLDL Cholesterol Cal: 22 mg/dL (ref 5–40)

## 2022-08-23 LAB — INSULIN, RANDOM: INSULIN: 24.4 u[IU]/mL (ref 2.6–24.9)

## 2022-08-23 LAB — TSH+T4F+T3FREE
Free T4: 1.07 ng/dL (ref 0.82–1.77)
T3, Free: 3 pg/mL (ref 2.0–4.4)
TSH: 1.56 u[IU]/mL (ref 0.450–4.500)

## 2022-08-23 LAB — VITAMIN D 25 HYDROXY (VIT D DEFICIENCY, FRACTURES): Vit D, 25-Hydroxy: 19.4 ng/mL — ABNORMAL LOW (ref 30.0–100.0)

## 2022-08-29 ENCOUNTER — Ambulatory Visit: Payer: Medicaid Other | Attending: Internal Medicine | Admitting: Internal Medicine

## 2022-08-29 ENCOUNTER — Encounter: Payer: Self-pay | Admitting: Internal Medicine

## 2022-08-29 VITALS — BP 121/81 | HR 86 | Resp 15 | Ht 63.0 in | Wt 285.0 lb

## 2022-08-29 DIAGNOSIS — M0800 Unspecified juvenile rheumatoid arthritis of unspecified site: Secondary | ICD-10-CM | POA: Diagnosis not present

## 2022-08-29 DIAGNOSIS — M160 Bilateral primary osteoarthritis of hip: Secondary | ICD-10-CM | POA: Diagnosis not present

## 2022-08-29 DIAGNOSIS — F424 Excoriation (skin-picking) disorder: Secondary | ICD-10-CM

## 2022-08-29 DIAGNOSIS — M0820 Juvenile rheumatoid arthritis with systemic onset, unspecified site: Secondary | ICD-10-CM | POA: Diagnosis not present

## 2022-08-29 NOTE — Progress Notes (Unsigned)
Chief Complaint:   OBESITY [name] is here to discuss his progress with his obesity treatment plan along with follow-up of his obesity related diagnoses. [name] is on following a lower carbohydrate, vegetable and lean protein rich diet plan and states he is following his eating plan approximately 40% of the time. [name] states he is walking for 10 minutes 3-4 times per week.  Today's visit was #: 6 Starting weight: 281 lbs Starting date: 05/23/2022 Today's weight: 283 lbs Today's date: 08/22/2022 Total lbs lost to date: 0 Total lbs lost since last in-office visit: 0  Interim History: [name]'s weight remains the same. He is meal planning, but certain types of food is not available.   Subjective:   1. Vitamin D deficiency [name] notes minimal sun exposure.   2. Other specified hypothyroidism [name] is taking Synthroid.   3. Elevated cholesterol [name] is not on medications.   4. Insulin resistance [name] is not on medications.   5. Other disorder of eating [name] is taking Topamax per Psychologist.   Assessment/Plan:   1. Vitamin D deficiency We will check labs today, and we will refill prescription Vitamin D for 1 month.   - Vitamin D, Ergocalciferol, (DRISDOL) 1.25 MG (50000 UNIT) CAPS capsule; Take 1 capsule (50,000 Units total) by mouth every 7 (seven) days.  Dispense: 5 capsule; Refill: 0 - VITAMIN D 25 Hydroxy (Vit-D Deficiency, Fractures)  2. Other specified hypothyroidism We will check labs today, and we will refill Synthroid for 1 month.   - levothyroxine (SYNTHROID) 25 MCG tablet; Take 1 tablet (25 mcg total) by mouth daily.  Dispense: 30 tablet; Refill: 0 - TSH+T4F+T3Free  3. Elevated cholesterol We will check labs today, and we will follow-up at [name]'s next visit.   - Lipid Panel With LDL/HDL Ratio - Comprehensive metabolic panel  4. Insulin resistance We will check labs today. [name] will keep all carbohydrates low.   -  Insulin, random - Lipid Panel With LDL/HDL Ratio - Comprehensive metabolic panel  5. Other disorder of eating [name] will continue her medications.   6. Obesity, Current BMI 50.2 [name] is currently in the action stage of change. As such, his goal is to continue with weight loss efforts. He has agreed to following a lower carbohydrate, vegetable and lean protein rich diet plan.   Meal planning and intentional eating. He will get back on track in March 2024. Keep water intake up and continue all medications.   Exercise goals: As is.   Behavioral modification strategies: increasing lean protein intake, decreasing simple carbohydrates, increasing vegetables, increasing water intake, decreasing eating out, no skipping meals, meal planning and cooking strategies, keeping healthy foods in the home, and planning for success.  [name] has agreed to follow-up with our clinic in 3 weeks. He was informed of the importance of frequent follow-up visits to maximize his success with intensive lifestyle modifications for his multiple health conditions.   [name] was informed we would discuss his lab results at his next visit unless there is a critical issue that needs to be addressed sooner. [name] agreed to keep his next visit at the agreed upon time to discuss these results.  Objective:   Blood pressure 134/85, pulse (!) 103, temperature 98 F (36.7 C), height 5\' 3"  (1.6 m), weight 283 lb (128.4 kg), SpO2 99 %. Body mass index is 50.13 kg/m.  General: Cooperative, alert, well developed, in no acute distress. HEENT: Conjunctivae and lids unremarkable. Cardiovascular: Regular rhythm.  Lungs: Normal work of  breathing. Neurologic: No focal deficits.   Lab Results  Component Value Date   CREATININE 0.93 08/22/2022   BUN 9 08/22/2022   NA 142 08/22/2022   K 4.3 08/22/2022   CL 105 08/22/2022   CO2 21 08/22/2022   Lab Results  Component Value Date   ALT 45 (H) 08/22/2022   AST 27  08/22/2022   ALKPHOS 84 08/22/2022   BILITOT 0.5 08/22/2022   Lab Results  Component Value Date   HGBA1C 5.5 05/23/2022   HGBA1C 4.9 03/06/2022   HGBA1C 4.9 01/04/2022   HGBA1C 5.1 05/03/2019   Lab Results  Component Value Date   INSULIN 24.4 08/22/2022   INSULIN 16.4 05/23/2022   Lab Results  Component Value Date   TSH 1.560 08/22/2022   Lab Results  Component Value Date   CHOL 171 08/22/2022   HDL 42 08/22/2022   LDLCALC 107 (H) 08/22/2022   TRIG 125 08/22/2022   CHOLHDL 4.9 (H) 04/25/2022   Lab Results  Component Value Date   VD25OH 19.4 (L) 08/22/2022   VD25OH 17.8 (L) 04/25/2022   VD25OH 10.0 (L) 01/04/2022   Lab Results  Component Value Date   WBC 9.3 04/25/2022   HGB 13.0 04/25/2022   HCT 39.7 04/25/2022   MCV 87 04/25/2022   PLT 306 04/25/2022   Lab Results  Component Value Date   FERRITIN 42 12/25/2021   Attestation Statements:   Reviewed by clinician on day of visit: allergies, medications, problem list, medical history, surgical history, family history, social history, and previous encounter notes.   Wilhemena Durie, am acting as Location manager for [employer], DO.  I have reviewed the above documentation for accuracy and completeness, and I agree with the above. Jearld Lesch, DO

## 2022-08-29 NOTE — Progress Notes (Signed)
Office Visit Note  Patient: [name]             Date of Birth: May 31, 1991           MRN: [id number]             PCP: Rosalee Kaufman, PA-C Referring: Baruch Gouty, FNP Visit Date: 08/29/2022   Subjective:  New Patient (Initial Visit) (Establish care)   History of Present Illness: [name] is a 32 y.o. adult here for arthritis with history of AOSD and unspecified pattern of JIA. They were on initial treatment with prednisone and methotrexate starting from the age of 2 for onset of inflammatory arthritis continued until age 19 before tapering off due to disease improvement. Onset of AOSD in 2014 with multiple episodes until most recent significant flare that was in 2017. This is characterized with systemic symptoms including fevers up to 104 Fahrenheit, joint pain, diffuse erythematous body rashes, and oral pustular lesions with diffuse pain all over.  The symptoms have responded to oral prednisone but has been tried on a number of steroid sparing medications. Initial biologic therapy with anakinra but was transition to Humira for long-term maintenance treatment.  Stopped Humira in 2021 due to no recent flares it was not seeing any significant improvement in the day-to-day joint pain symptoms with treatment.  Other medications tried include methotrexate and leflunomide this was complicated by GI intolerance and also worsening of chronic liver transaminase elevations.   He reports still experiencing low-grade fevers never above 101 Fahrenheit frequently occurring around nighttime especially after a day with a higher amount of stress and physical activity.  Bilateral hip pain is the most limiting symptom. Some right knee pain with walking and has noticed some abnormal rotation or posture on this side. Orthopedic evaluation has also noticed some spine curvature and disc slipping contributing to back pain. Takes ibuprofen very frequently for this reports about 800 mg 3-4 times  daily.  Does not see any improvement with Tylenol.  Does not take any other chronic medication for joint pain.  He is seeing a weight management clinic for trying to lose weight to be a candidate for total hip arthroplasty.  This has been challenging so far though due to limited exercise capacity from joint pain.   Activities of Daily Living:  Patient reports morning stiffness for 2 hours.   Patient Reports nocturnal pain.  Difficulty dressing/grooming: Reports Difficulty climbing stairs: Reports Difficulty getting out of chair: Reports Difficulty using hands for taps, buttons, cutlery, and/or writing: Denies  Review of Systems  Constitutional:  Positive for fatigue.  HENT:  Positive for mouth dryness. Negative for mouth sores.   Eyes:  Positive for dryness.  Respiratory:  Positive for shortness of breath.   Cardiovascular:  Positive for chest pain and palpitations.  Gastrointestinal:  Positive for diarrhea. Negative for blood in stool and constipation.  Endocrine: Negative for increased urination.  Genitourinary:  Negative for involuntary urination.  Musculoskeletal:  Positive for joint pain, gait problem, joint pain, joint swelling, myalgias, morning stiffness, muscle tenderness and myalgias. Negative for muscle weakness.  Skin:  Positive for sensitivity to sunlight. Negative for color change, rash and hair loss.  Allergic/Immunologic: Negative for susceptible to infections.  Neurological:  Positive for headaches. Negative for dizziness.  Hematological:  Negative for swollen glands.  Psychiatric/Behavioral:  Positive for depressed mood. Negative for sleep disturbance. The patient is nervous/anxious.     PMFS History:  Patient Active Problem List  Diagnosis Date Noted   Other specified hypothyroidism 08/22/2022   Elevated cholesterol 08/22/2022   Insulin resistance 07/18/2022   History of tachycardia 06/06/2022   Elevated ALT measurement 06/06/2022   SOB (shortness of breath) on  exertion 05/23/2022   Health care maintenance 05/23/2022   Metabolic syndrome 05/23/2022   Vitamin B12 deficiency 05/23/2022   Acquired hypothyroidism 05/09/2022   Mixed hyperlipidemia 05/09/2022   Low HDL (under 40) 05/09/2022   Elevated glucose 05/09/2022   Eating disorder 05/09/2022   Other fatigue 05/09/2022   Class 3 severe obesity with serious comorbidity and body mass index (BMI) of 45.0 to 49.9 in adult (HCC) 05/09/2022   Lumbar degenerative disc disease 03/13/2022   Bipolar depression (HCC) 03/06/2022   Chronic bilateral low back pain with right-sided sciatica 02/15/2022   Vitamin D deficiency 01/04/2022   Loud snoring 01/04/2022   GAD (generalized anxiety disorder) 01/04/2022   Marijuana use 01/04/2022   Chest pain 12/25/2021   Transaminitis 12/25/2021   GERD without esophagitis 12/25/2021   Elevated lipase 12/25/2021   Compulsive skin picking 07/28/2020   Social phobia 07/28/2020   Bipolar I disorder, current or most recent episode depressed, in partial remission (HCC) 07/19/2020   Bilateral primary osteoarthritis of hip 07/19/2020   Allergic rhinitis due to pollen 05/12/2020   MDD (major depressive disorder), recurrent episode, severe (HCC) 05/02/2019   High risk medication use 05/03/2017   Tobacco use 05/29/2016   Arthralgia of multiple sites, bilateral 03/06/2016   Morbid obesity (HCC) 11/05/2014   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   Insomnia 02/18/2014   Bipolar affective disorder, currently depressed, moderate (HCC) 05/25/2011   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    Past Medical History:  Diagnosis Date   Anxiety attack    panic attacks   Back pain    Chest pain    Constipation    Depression    Drug use    Food allergy    Gallbladder problem    GERD (gastroesophageal reflux disease)    Headache    High blood pressure    History of heart attack    History of stomach ulcers    Hypothyroidism    Joint pain    Myocardial infarction [facility])     age 45   Osteoarthritis    Pericarditis    RA (rheumatoid arthritis) (HCC)    SOB (shortness of breath)    Still disease, 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HCC)    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HCC)    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HCC)    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HCC)    Swallowing difficulty    Vitamin D deficiency     Family History  Problem Relation Age of Onset   Heart disease Mother    Heart attack Mother    High blood pressure Mother    Sudden death Mother    Depression Father    Rheum arthritis Father    Hypertension Father    Bipolar disorder Brother    Past Surgical History:  Procedure Laterality Date   CHOLECYSTECTOMY N/A 08/29/2021   Procedure: LAPAROSCOPIC CHOLECYSTECTOMY WITH ICG;  Surgeon: Andria Meuse, MD;  Location: [facility];  Service: General;  Laterality: N/A;   WISDOM TOOTH EXTRACTION     Social History   Social History Narrative   Not on file   Immunization History  Administered Date(s) Administered   Influenza,inj,Quad PF,6+ Mos 05/03/2017, 04/25/2022   Pneumococcal Conjugate-13 10/11/2017     Objective: Vital Signs: BP 121/81 (BP Location: Right Arm, Patient Position: Sitting, Cuff  Size: Normal)   Pulse 86   Resp 15   Ht 5\' 3"  (1.6 m)   Wt 285 lb (129.3 kg)   BMI 50.49 kg/m    Physical Exam Constitutional:      Appearance: He is obese.  HENT:     Right Ear: External ear normal.     Left Ear: External ear normal.     Mouth/Throat:     Mouth: Mucous membranes are moist.     Pharynx: Oropharynx is clear.  Eyes:     Conjunctiva/sclera: Conjunctivae normal.  Cardiovascular:     Rate and Rhythm: Normal rate and regular rhythm.  Pulmonary:     Effort: Pulmonary effort is normal.     Breath sounds: Normal breath sounds.  Musculoskeletal:     Right lower leg: No edema.     Left lower leg: No edema.  Lymphadenopathy:     Cervical: No cervical adenopathy.  Skin:    General: Skin is warm and dry.     Findings: No rash.     Comments: Skin and nail picking changes both hands   Neurological:     Mental Status: He is alert.  Psychiatric:        Mood and Affect: Mood normal.      Musculoskeletal Exam:  Shoulders full ROM no tenderness or swelling Elbows full ROM no tenderness or swelling Wrists full ROM no tenderness or swelling Fingers full ROM no tenderness or swelling Severely limited internal and external rotation in left hip, limited less severely on right side, no lateral hip tenderness to pressure Knees full ROM no tenderness or swelling, right appears slightly externally rotated Ankles full ROM no tenderness or swelling small superficial skin nodule on posterior left ankle   Investigation: No additional findings.  Imaging: No results found.  Recent Labs: Lab Results  Component Value Date   WBC 9.3 04/25/2022   HGB 13.0 04/25/2022   PLT 306 04/25/2022   NA 142 08/22/2022   K 4.3 08/22/2022   CL 105 08/22/2022   CO2 21 08/22/2022   GLUCOSE 77 08/22/2022   BUN 9 08/22/2022   CREATININE 0.93 08/22/2022   BILITOT 0.5 08/22/2022   ALKPHOS 84 08/22/2022   AST 27 08/22/2022   ALT 45 (H) 08/22/2022   PROT 7.5 08/22/2022   ALBUMIN 4.2 08/22/2022   CALCIUM 9.2 08/22/2022   GFRAA >60 05/01/2019    Speciality Comments: No specialty comments available.  Procedures:  No procedures performed Allergies: Strawberry extract   Assessment / Plan:     Visit Diagnoses: 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[place]) Juvenile rheumatoid arthritis ([place]) - Plan: Sedimentation rate, C-reactive protein, Iron, TIBC and Ferritin Panel, ANA, CBC with Differential/Platelet  Extensive inflammatory disease history no concerning findings on physical exam today.  The reported low-grade fevers may be consistent with some low-grade inflammation will check a workup today including serum inflammatory markers iron panel and complete blood count.  Will recheck the positive ANA which has previously been positive without specific antibodies identified.  If results are reassuring no specific  indication to resume maintenance therapy at this time and will follow-up for observation or as needed if has new flareup.  Bilateral primary osteoarthritis of hip  Very limited range of motion on exam consistent with advanced osteoarthritis in both hips.  Current ibuprofen dose for symptom relief is very high.  Currently working on weight loss to improve candidacy for arthroplasty which will be needed for definitive management.  Compulsive skin picking  Distal skin changes  involving both hands no associated paronychia or local inflammation noted.  Orders: Orders Placed This Encounter  Procedures   Sedimentation rate   C-reactive protein   Iron, TIBC and Ferritin Panel   ANA   CBC with Differential/Platelet   No orders of the defined types were placed in this encounter.   Follow-Up Instructions: No follow-ups on file.   Collier Salina, MD  Note - This record has been created using [employer].  Chart creation errors have been sought, but may not always  have been located. Such creation errors do not reflect on  the standard of medical care.

## 2022-08-30 ENCOUNTER — Encounter: Payer: Self-pay | Admitting: Neurology

## 2022-08-30 ENCOUNTER — Ambulatory Visit (INDEPENDENT_AMBULATORY_CARE_PROVIDER_SITE_OTHER): Payer: Medicaid Other | Admitting: Neurology

## 2022-08-30 ENCOUNTER — Encounter: Payer: Self-pay | Admitting: Bariatrics

## 2022-08-30 VITALS — BP 132/86 | Ht 63.0 in | Wt 286.3 lb

## 2022-08-30 DIAGNOSIS — G4733 Obstructive sleep apnea (adult) (pediatric): Secondary | ICD-10-CM | POA: Diagnosis not present

## 2022-08-30 DIAGNOSIS — G4719 Other hypersomnia: Secondary | ICD-10-CM

## 2022-08-30 NOTE — Patient Instructions (Signed)
I will increase your settings to a high limit, if you have any issues let me know, I will reach out in 1 months, try to use nightly > 4 hours, need to improve compliance for insurance requirements

## 2022-08-30 NOTE — Progress Notes (Signed)
Patient: [name] Date of Birth: [birth year]  Reason for Visit: Follow up History from: Patient Primary Neurologist: Rexene Alberts  ASSESSMENT AND PLAN 32 y.o. year old adult   1.  OSA on CPAP -Increased pressure 5-14 cmH2O, few spells of waking up gasping for air, pressure parameters have been 5-11, mostly stays around 10.5 -We discussed the importance of nightly use, for greater than 4 hours, some of his data is skewed since he works nights, does not sleep for long periods of time during the day, we reviewed that CPAP data is cumulative over 24 hours. -I will pull a download in about 4 weeks to see if his greater than 4-hour usage has improved -He is motivated to lose weight, working on weight loss clinic -We will otherwise follow up in 6 months or sooner if needed  HISTORY OF PRESENT ILLNESS: Today 08/30/22 Here for initial CPAP visit.  Had diagnostic PSG 05/18/22 showing obstructive sleep apnea.  OSA was mild, but much more pronounced during REM sleep.  Total AHI 13.9/hour, REM AHI 38.9/hour.  He initially presented with the symptoms of snoring and excessive daytime somnolence, waking up gasping for air. Using nasal pillow mask.  Set up date was 06/07/22.  Feels benefit from CPAP, feeling more resting, morning headaches resolved. Few times wake up at night rip mask off, gasping for air. Typically goes to bed around 9, puts mask on. Works nights, sleeps during the day. Usually only sleeps 3-4 hours during the day.  Notices better sleep on the days off, wakes up the next morning feeling great with CPAP.  Working with weight loss clinic. Is on testosterone, has resulted in weight gain.  ESS 6.  Review of CPAP data 07/31/22-08/29/22 shows you send 27/30 days at 90%, greater than 4 hours 14 days at 47%.  Average usage days used 4 hours and 28 minutes.  Minimum pressure 5, maximum pressure 11 cm water.  Pressure in the 95th percentile is 10.5, leak is 3.1, AHI 0.9.  HISTORY  I saw your patient,  [name], upon your kind request in my sleep clinic today for initial consultation of his sleep disorder, in particular, concern for underlying obstructive sleep apnea.  The patient is accompanied by his fianc today; he missed an appointment on 02/24/2022.  As you know, Mr. Nierman is a 32 year old transgender (female to female) man with an underlying medical history of rheumatoid arthritis, pericarditis, Hx of MI, anxiety, depression, mixed hyperlipidemia, elevated liver enzymes, hypothyroidism, and morbid obesity with a BMI of over 45, who reports snoring and excessive daytime somnolence.  I reviewed the office note from 01/04/2022.  His Epworth sleepiness score is 11 out of 24, fatigue severity score is 53 out of 63.  He lives alone, his fiance is moving from [place] to [place] soon.  He has 3 cats in the household.  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  Bedtime generally during the workweek is 10 or 11 AM and rise time around 6 or 7 PM.  His schedule when he is not working is typically 9 to 10 PM in bed and rise time between 6 and 7 AM.  He does have nocturia about twice per average night and has woken up occasionally with a headache.  He quit smoking some 2 years ago but does vape nicotine daily.  He does not currently drink any alcohol and drinks caffeine in  the form of coffee, 1 a day typically.  He has been told that he had bradycardia at night.  He has also had apneic sounds and pauses in his breathing while asleep.  He has woken up with a sense of gasping for air.  He has a maternal uncle with sleep apnea.  He had sleep study testing in or around 2019 and it was negative for sleep apnea at the time per his recollection, testing was done in [facility].  Prior test results are not available for my review today.  He reports a significant amount of weight gain in the interim in the realm of 40 pounds.  He is currently on  testosterone injections.   REVIEW OF SYSTEMS: Out of a complete 14 system review of symptoms, the patient complains only of the following symptoms, and all other reviewed systems are negative.  See HPI  ALLERGIES: Allergies  Allergen Reactions   Strawberry Extract Itching and Rash    HOME MEDICATIONS: Outpatient Medications Prior to Visit  Medication Sig Dispense Refill   amitriptyline (ELAVIL) 50 MG tablet Take 50 mg by mouth at bedtime.     ARIPiprazole (ABILIFY) 10 MG tablet Take 1 tablet (10 mg total) by mouth daily. 30 tablet 0   B-D TB SYRINGE .5CC/27GX1/2" 27G X 1/2" 0.5 ML MISC USE 1 EACH ONCE A WEEK FOR 84 DAYS     BD SAFETYGLIDE NEEDLE 18G X 1-1/2" MISC USE 1 EACH ONCE A WEEK FOR 84 DAYS     clonazePAM (KLONOPIN) 0.5 MG tablet Take 0.5 mg by mouth daily as needed.     gabapentin (NEURONTIN) 300 MG capsule Take 300 mg by mouth 3 (three) times daily.     hydrOXYzine (ATARAX) 25 MG tablet Take 1 tablet (25 mg total) by mouth 3 (three) times daily as needed for anxiety. 30 tablet 0   ibuprofen (ADVIL) 200 MG tablet Take 800 mg by mouth every 6 (six) hours as needed (For leg pain).     ibuprofen (ADVIL) 800 MG tablet Take 800 mg by mouth every 6 (six) hours as needed.     levothyroxine (SYNTHROID) 25 MCG tablet Take 1 tablet (25 mcg total) by mouth daily. 30 tablet 0   metFORMIN (GLUCOPHAGE) 500 MG tablet Take 1 tablet (500 mg total) by mouth 2 (two) times daily with a meal. 180 tablet 3   omeprazole (PRILOSEC) 40 MG capsule Take 1 capsule (40 mg total) by mouth daily. 90 capsule 2   ondansetron (ZOFRAN) 8 MG tablet Take by mouth.     ondansetron (ZOFRAN-ODT) 4 MG disintegrating tablet Take 4 mg by mouth 2 (two) times daily.     promethazine (PHENERGAN) 25 MG tablet Take 25 mg by mouth every 6 (six) hours as needed.     rosuvastatin (CRESTOR) 5 MG tablet Take 1 tablet (5 mg total) by mouth daily. 30 tablet 0   testosterone cypionate (DEPOTESTOSTERONE CYPIONATE) 200 MG/ML injection  Inject into the skin.     topiramate (TOPAMAX) 25 MG tablet Take 25 mg by mouth 2 (two) times daily.     TUBERCULIN SYR 1CC/27GX1/2" 27G X 1/2" 1 ML MISC Use 1 each once a week for 84 days     Vitamin D, Ergocalciferol, (DRISDOL) 1.25 MG (50000 UNIT) CAPS capsule Take 1 capsule (50,000 Units total) by mouth every 7 (seven) days. 5 capsule 0   gabapentin (NEURONTIN) 300 MG capsule Take by mouth.     No facility-administered medications prior to visit.  PAST MEDICAL HISTORY: Past Medical History:  Diagnosis Date   Anxiety attack    panic attacks   Back pain    Chest pain    Constipation    Depression    Drug use    Food allergy    Gallbladder problem    GERD (gastroesophageal reflux disease)    Headache    High blood pressure    History of heart attack    History of stomach ulcers    Hypothyroidism    Joint pain    Myocardial infarction [facility])    age 22   Osteoarthritis    Pericarditis    RA (rheumatoid arthritis) (HCC)    SOB (shortness of breath)    Still disease, 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[place])    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[place])    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[place])    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[place])    Swallowing difficulty    Vitamin D deficiency     PAST SURGICAL HISTORY: Past Surgical History:  Procedure Laterality Date   CHOLECYSTECTOMY N/A 08/29/2021   Procedure: LAPAROSCOPIC CHOLECYSTECTOMY WITH ICG;  Surgeon: Ileana Roup, MD;  Location: [facility];  Service: General;  Laterality: N/A;   WISDOM TOOTH EXTRACTION      FAMILY HISTORY: Family History  Problem Relation Age of Onset   Heart disease Mother    Heart attack Mother    High blood pressure Mother    Sudden death Mother    Depression Father    Rheum arthritis Father    Hypertension Father    Bipolar disorder Brother     SOCIAL HISTORY: Social History   Socioeconomic History   Marital status: Significant Other    Spouse name: Not on file   Number of children: Not on file   Years of education: Not on file   Highest education  level: Not on file  Occupational History   Not on file  Tobacco Use   Smoking status: Former    Packs/day: 1.00    Years: 5.00    Total pack years: 5.00    Types: Cigarettes, E-cigarettes    Start date: 07/27/2009    Quit date: 12/2019    Years since quitting: 2.6    Passive exposure: Current   Smokeless tobacco: Never  Vaping Use   Vaping Use: Every day   Substances: Nicotine, Flavoring  Substance and Sexual Activity   Alcohol use: Not Currently   Drug use: Not Currently    Types: Marijuana    Comment: cannabis use d/o, 2 weeks of sobriety   Sexual activity: Yes    Partners: Female    Birth control/protection: None  Other Topics Concern   Not on file  Social History Narrative   Not on file   Social Determinants of Health   Financial Resource Strain: Not on file  Food Insecurity: Food Insecurity Present (06/01/2022)   Hunger Vital Sign    Worried About Running Out of Food in the Last Year: Sometimes true    Ran Out of Food in the Last Year: Sometimes true  Transportation Needs: No Transportation Needs (06/01/2022)   PRAPARE - Hydrologist (Medical): No    Lack of Transportation (Non-Medical): No  Physical Activity: Not on file  Stress: Stress Concern Present (03/21/2022)   [place]    Feeling of Stress : To some extent  Social Connections: Not on file  Intimate Partner Violence: Not on file    PHYSICAL  EXAM  Vitals:   08/30/22 1101  BP: 132/86  Weight: 286 lb 5 oz (129.9 kg)  Height: 5\' 3"  (1.6 m)   Body mass index is 50.72 kg/m.  Generalized: Well developed, in no acute distress  Neurological examination  Mentation: Alert oriented to time, place, history taking. Follows all commands speech and language fluent Cranial nerve II-XII: Pupils were equal round reactive to light. Extraocular movements were full, visual field were full on confrontational test. Facial  sensation and strength were normal.  Head turning and shoulder shrug  were normal and symmetric. Motor: Decreased bilateral hip flexion due to pain Sensory: Sensory testing is intact to soft touch on all 4 extremities. No evidence of extinction is noted.  Coordination: Cerebellar testing reveals good finger-nose-finger and heel-to-shin bilaterally.  Gait and station: Gait is antalgic.  DIAGNOSTIC DATA (LABS, IMAGING, TESTING) - I reviewed patient records, labs, notes, testing and imaging myself where available.  Lab Results  Component Value Date   WBC 9.0 08/29/2022   HGB 14.6 08/29/2022   HCT 44.1 08/29/2022   MCV 85.1 08/29/2022   PLT 324 08/29/2022      Component Value Date/Time   NA 142 08/22/2022 1148   K 4.3 08/22/2022 1148   CL 105 08/22/2022 1148   CO2 21 08/22/2022 1148   GLUCOSE 77 08/22/2022 1148   GLUCOSE 101 (H) 03/06/2022 0110   BUN 9 08/22/2022 1148   CREATININE 0.93 08/22/2022 1148   CALCIUM 9.2 08/22/2022 1148   PROT 7.5 08/22/2022 1148   ALBUMIN 4.2 08/22/2022 1148   AST 27 08/22/2022 1148   ALT 45 (H) 08/22/2022 1148   ALKPHOS 84 08/22/2022 1148   BILITOT 0.5 08/22/2022 1148   GFRNONAA >60 03/06/2022 0110   GFRAA >60 05/01/2019 1927   Lab Results  Component Value Date   CHOL 171 08/22/2022   HDL 42 08/22/2022   LDLCALC 107 (H) 08/22/2022   TRIG 125 08/22/2022   CHOLHDL 4.9 (H) 04/25/2022   Lab Results  Component Value Date   HGBA1C 5.5 05/23/2022   Lab Results  Component Value Date   VITAMINB12 476 05/23/2022   Lab Results  Component Value Date   TSH 1.560 08/22/2022    08/24/2022, AGNP-C, DNP 08/30/2022, 11:39 AM [facility] [address] [place], [place] [phone]

## 2022-08-31 ENCOUNTER — Telehealth: Payer: Self-pay

## 2022-08-31 LAB — CBC WITH DIFFERENTIAL/PLATELET
Absolute Monocytes: 378 cells/uL (ref 200–950)
Basophils Absolute: 36 cells/uL (ref 0–200)
Basophils Relative: 0.4 %
Eosinophils Absolute: 333 cells/uL (ref 15–500)
Eosinophils Relative: 3.7 %
HCT: 44.1 % (ref 35.0–45.0)
Hemoglobin: 14.6 g/dL (ref 11.7–15.5)
Lymphs Abs: 2115 cells/uL (ref 850–3900)
MCH: 28.2 pg (ref 27.0–33.0)
MCHC: 33.1 g/dL (ref 32.0–36.0)
MCV: 85.1 fL (ref 80.0–100.0)
MPV: 10.2 fL (ref 7.5–12.5)
Monocytes Relative: 4.2 %
Neutro Abs: 6138 cells/uL (ref 1500–7800)
Neutrophils Relative %: 68.2 %
Platelets: 324 10*3/uL (ref 140–400)
RBC: 5.18 10*6/uL — ABNORMAL HIGH (ref 3.80–5.10)
RDW: 13.8 % (ref 11.0–15.0)
Total Lymphocyte: 23.5 %
WBC: 9 10*3/uL (ref 3.8–10.8)

## 2022-08-31 LAB — ANA: Anti Nuclear Antibody (ANA): POSITIVE — AB

## 2022-08-31 LAB — IRON,TIBC AND FERRITIN PANEL
%SAT: 10 % (calc) — ABNORMAL LOW (ref 16–45)
Ferritin: 18 ng/mL (ref 16–154)
Iron: 48 ug/dL (ref 40–190)
TIBC: 459 mcg/dL (calc) — ABNORMAL HIGH (ref 250–450)

## 2022-08-31 LAB — ANTI-NUCLEAR AB-TITER (ANA TITER): ANA Titer 1: 1:80 {titer} — ABNORMAL HIGH

## 2022-08-31 LAB — SEDIMENTATION RATE: Sed Rate: 22 mm/h — ABNORMAL HIGH (ref 0–20)

## 2022-08-31 LAB — C-REACTIVE PROTEIN: CRP: 12.3 mg/L — ABNORMAL HIGH (ref <8.0)

## 2022-08-31 NOTE — Telephone Encounter (Signed)
CPAP order from 08/30/2022 faxed to [facility].

## 2022-09-04 ENCOUNTER — Telehealth: Payer: Self-pay

## 2022-09-04 NOTE — Telephone Encounter (Signed)
Sent to patient MyChart

## 2022-09-04 NOTE — Telephone Encounter (Signed)
Patient called in and was wanting to see if the the Low carb meal plan could be sent to his mychart. Patient states he is going to the store today and he can't find the paper.

## 2022-09-04 NOTE — Progress Notes (Signed)
Lab results look okay she has very mild elevation in sed rate and CRP.  Iron studies show mild iron deficiency there is an normal ferritin so I do not think any current activity of stills disease.  ANA test remains a very low positive actually less than it has been in the past.  I would like to monitor symptoms we can schedule follow-up in about 6 months or as needed if there is a flareup.

## 2022-09-12 ENCOUNTER — Ambulatory Visit: Payer: Medicaid Other | Admitting: Bariatrics

## 2022-09-16 ENCOUNTER — Other Ambulatory Visit: Payer: Self-pay | Admitting: Bariatrics

## 2022-09-16 DIAGNOSIS — E038 Other specified hypothyroidism: Secondary | ICD-10-CM

## 2022-09-19 ENCOUNTER — Encounter: Payer: Self-pay | Admitting: Bariatrics

## 2022-09-19 ENCOUNTER — Ambulatory Visit: Payer: Medicaid Other | Admitting: Bariatrics

## 2022-09-19 VITALS — BP 129/85 | HR 125 | Temp 97.9°F | Ht 63.0 in | Wt 284.0 lb

## 2022-09-19 DIAGNOSIS — E559 Vitamin D deficiency, unspecified: Secondary | ICD-10-CM

## 2022-09-19 DIAGNOSIS — Z6841 Body Mass Index (BMI) 40.0 and over, adult: Secondary | ICD-10-CM | POA: Insufficient documentation

## 2022-09-19 DIAGNOSIS — E782 Mixed hyperlipidemia: Secondary | ICD-10-CM | POA: Diagnosis not present

## 2022-09-19 MED ORDER — VITAMIN D (ERGOCALCIFEROL) 1.25 MG (50000 UNIT) PO CAPS: 50000.0000 [IU] | ORAL_CAPSULE | ORAL | 0 refills | Status: DC

## 2022-09-27 ENCOUNTER — Encounter: Payer: Self-pay | Admitting: Neurology

## 2022-10-01 NOTE — Progress Notes (Unsigned)
Chief Complaint:   OBESITY [name] is here to discuss his progress with his obesity treatment plan along with follow-up of his obesity related diagnoses. [name] is on following a lower carbohydrate, vegetable and lean protein rich diet plan and states he is following his eating plan approximately 10% of the time. [name] states he is walking 10-15 minutes 3 times per week.  Today's visit was #: 7 Starting weight: 281 lbs Starting date: 05/23/2022 Today's weight: 284 lbs Today's date: 09/19/2022 Total lbs lost to date: 0 Total lbs lost since last in-office visit: +1  Interim History: [name] is under more stress and his situation has improved some. He is taking testosterone and increased dose.  Subjective:   1. Vitamin D deficiency [name] is taking high dose Vitamin D 50,000 IU.  2. Mixed hyperlipidemia [name] is taking Crestor as directed.  Assessment/Plan:   1. Vitamin D deficiency Continue current treatment plan.  Refill- Vitamin D, Ergocalciferol, (DRISDOL) 1.25 MG (50000 UNIT) CAPS capsule; Take 1 capsule (50,000 Units total) by mouth every 7 (seven) days.  Dispense: 5 capsule; Refill: 0  2. Mixed hyperlipidemia Continue Crestor. No trans fats and keep saturated fats low.  3. Morbid obesity ([place]) 4. BMI 50.0-59.9, adult ([place]) Keep water high. Will get more fruit for snacks and increase vegetables. Increase protein.  [name] is currently in the action stage of change. As such, his goal is to continue with weight loss efforts. He has agreed to following a lower carbohydrate, vegetable and lean protein rich diet plan.   Exercise goals:  Increase walking- will walk most days.  Behavioral modification strategies: increasing lean protein intake, decreasing simple carbohydrates, increasing vegetables, increasing water intake, decreasing eating out, no skipping meals, meal planning and cooking strategies, keeping healthy foods in the home, and planning for  success.  [name] has agreed to follow-up with our clinic in 4 weeks. He was informed of the importance of frequent follow-up visits to maximize his success with intensive lifestyle modifications for his multiple health conditions.   Objective:   Blood pressure 129/85, pulse (!) 125, temperature 97.9 F (36.6 C), height '5\' 3"'$  (1.6 m), weight 284 lb (128.8 kg), SpO2 95 %. Body mass index is 50.31 kg/m.  General: Cooperative, alert, well developed, in no acute distress. HEENT: Conjunctivae and lids unremarkable. Cardiovascular: Regular rhythm.  Lungs: Normal work of breathing. Neurologic: No focal deficits.   Lab Results  Component Value Date   CREATININE 0.93 08/22/2022   BUN 9 08/22/2022   NA 142 08/22/2022   K 4.3 08/22/2022   CL 105 08/22/2022   CO2 21 08/22/2022   Lab Results  Component Value Date   ALT 45 (H) 08/22/2022   AST 27 08/22/2022   ALKPHOS 84 08/22/2022   BILITOT 0.5 08/22/2022   Lab Results  Component Value Date   HGBA1C 5.5 05/23/2022   HGBA1C 4.9 03/06/2022   HGBA1C 4.9 01/04/2022   HGBA1C 5.1 05/03/2019   Lab Results  Component Value Date   INSULIN 24.4 08/22/2022   INSULIN 16.4 05/23/2022   Lab Results  Component Value Date   TSH 1.560 08/22/2022   Lab Results  Component Value Date   CHOL 171 08/22/2022   HDL 42 08/22/2022   LDLCALC 107 (H) 08/22/2022   TRIG 125 08/22/2022   CHOLHDL 4.9 (H) 04/25/2022   Lab Results  Component Value Date   VD25OH 19.4 (L) 08/22/2022   VD25OH 17.8 (L) 04/25/2022   VD25OH 10.0 (L) 01/04/2022  Lab Results  Component Value Date   WBC 9.0 08/29/2022   HGB 14.6 08/29/2022   HCT 44.1 08/29/2022   MCV 85.1 08/29/2022   PLT 324 08/29/2022   Lab Results  Component Value Date   IRON 48 08/29/2022   TIBC 459 (H) 08/29/2022   FERRITIN 18 08/29/2022   Attestation Statements:   Reviewed by clinician on day of visit: allergies, medications, problem list, medical history, surgical history, family  history, social history, and previous encounter notes.  I, Kathlene November, BS, CMA, am acting as transcriptionist for [employer], DO.  I have reviewed the above documentation for accuracy and completeness, and I agree with the above. Jearld Lesch, DO

## 2022-10-02 ENCOUNTER — Encounter: Payer: Self-pay | Admitting: Bariatrics

## 2022-10-10 ENCOUNTER — Ambulatory Visit: Payer: Medicaid Other | Admitting: Cardiology

## 2022-10-10 NOTE — Progress Notes (Deleted)
Clinical Summary Mr. Basse is a 32 y.o.adult  seen today for follow up of the following medical problems.    1. Chest pain   From prior visit:  - on and off for several years. Occurs approx 1-2 times a day. Feels increased frequency and severity over the last year. Squeezing feeling left side, 5/10. Can occur at rest or with exertion. No other associated symptoms. Not positional. Pain lasts up to 10 minutes. Nothing makes pain better. - seen in ER 12/2013 with chest pain. Cosntant 8/10 left sided, lasted 5 hours, worst with position. 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 She reports a long hospitalization at [facility] in [place], [place]. She states she was treated with prednisone at the time for ongoing chest pain due to pericarditis and joint pain, pain somewhat improved with predinsone. - from notes had chest pain at that time, trop of 2.4, non-specific EKG changes but clinical symptoms. CT PE negative. +rub on exam. Symptoms improved with NSAIDs and steroids - can walk a mile on flatground, some DOE with incline that is stable. No LE edema.     Current visit:   ER visit 01/31/2021 with chest pain [facility] - trop neg x1. CXR no acute process. EKG report (tracing not available) SR, no ischemic changes   - chest pain onoing few months - left sided upper chest, sometimes under left breast. Tightness, can occur at rest or with exertion. Worst with deep breathing. Can have some associated SOB. 5/10 in severity. Pain can last up 6-7 hours - psych tried higher klonopin without benefit.  - has taken ibuprofen '800mg'$  once daily - similar to her prior chest pains.     -echo 02/2021 LVEF 55-60%, no WMAs, no effusion - dont see she had inflammatory markers checked - had course of NSAIDs    - some ongoing chest pains at times, she thinks more related to anxiety. Better with klonopin - episodes 1-2 times per week. Can be positional, sometimes better with stretching.    2. Still's disease - followed by Dr Deatra Canter at [facility], rheum history reviewed from his last note 04/01/14 which appears to have been there first visit together. 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 Due to weight gain she has been resistant to continued predisone.   - followed at [facility]   3. Hyperlipidemia     Past Medical History:  Diagnosis Date   Anxiety attack    panic attacks   Back pain    Chest pain    Constipation    Depression    Drug use    Food allergy    Gallbladder problem    GERD (gastroesophageal reflux disease)    Headache    High blood pressure    History of heart attack    History of stomach ulcers    Hypothyroidism    Joint pain    Myocardial infarction [facility])    age 39   Osteoarthritis    Pericarditis    RA (rheumatoid arthritis) (HCC)    SOB (shortness of breath)    Still disease, 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[place])    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[place])    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[place])    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[place])    Swallowing difficulty  Vitamin D deficiency      Allergies  Allergen Reactions   Strawberry Extract Itching and Rash     Current Outpatient Medications  Medication Sig Dispense Refill   amitriptyline (ELAVIL) 50 MG tablet Take 50 mg by mouth at bedtime.     ARIPiprazole (ABILIFY) 10 MG tablet Take 1 tablet (10 mg total) by mouth daily. 30 tablet 0   B-D TB SYRINGE .5CC/27GX1/2" 27G X 1/2" 0.5 ML MISC USE 1 EACH ONCE A WEEK FOR 84 DAYS     BD SAFETYGLIDE NEEDLE 18G X 1-1/2" MISC USE 1 EACH ONCE A WEEK FOR 84 DAYS     clonazePAM (KLONOPIN) 0.5 MG tablet Take 0.5 mg by mouth daily as needed.     gabapentin (NEURONTIN) 300 MG capsule Take 300 mg by mouth 3 (three) times daily.     gabapentin (NEURONTIN) 300 MG capsule Take by mouth.      hydrOXYzine (ATARAX) 25 MG tablet Take 1 tablet (25 mg total) by mouth 3 (three) times daily as needed for anxiety. 30 tablet 0   ibuprofen (ADVIL) 200 MG tablet Take 800 mg by mouth every 6 (six) hours as needed (For leg pain).     ibuprofen (ADVIL) 800 MG tablet Take 800 mg by mouth every 6 (six) hours as needed.     levothyroxine (SYNTHROID) 25 MCG tablet TAKE 1 TABLET BY MOUTH EVERY DAY 90 tablet 0   metFORMIN (GLUCOPHAGE) 500 MG tablet Take 1 tablet (500 mg total) by mouth 2 (two) times daily with a meal. 180 tablet 3   omeprazole (PRILOSEC) 40 MG capsule Take 1 capsule (40 mg total) by mouth daily. 90 capsule 2   ondansetron (ZOFRAN) 8 MG tablet Take by mouth.     ondansetron (ZOFRAN-ODT) 4 MG disintegrating tablet Take 4 mg by mouth 2 (two) times daily.     promethazine (PHENERGAN) 25 MG tablet Take 25 mg by mouth every 6 (six) hours as needed.     rosuvastatin (CRESTOR) 5 MG tablet Take 1 tablet (5 mg total) by mouth daily. 30 tablet 0   testosterone cypionate (DEPOTESTOSTERONE CYPIONATE) 200 MG/ML injection Inject into the skin.     topiramate (TOPAMAX) 25 MG tablet Take 25 mg by mouth 2 (two) times daily.     TUBERCULIN SYR 1CC/27GX1/2" 27G X 1/2" 1 ML MISC Use 1 each once a week for 84 days     Vitamin D, Ergocalciferol, (DRISDOL) 1.25 MG (50000 UNIT) CAPS capsule Take 1 capsule (50,000 Units total) by mouth every 7 (seven) days. 5 capsule 0   No current facility-administered medications for this visit.     Past Surgical History:  Procedure Laterality Date   CHOLECYSTECTOMY N/A 08/29/2021   Procedure: LAPAROSCOPIC CHOLECYSTECTOMY WITH ICG;  Surgeon: Ileana Roup, MD;  Location: [facility];  Service: General;  Laterality: N/A;   WISDOM TOOTH EXTRACTION       Allergies  Allergen Reactions   Strawberry Extract Itching and Rash      Family History  Problem Relation Age of Onset   Heart disease Mother    Heart attack Mother    High blood pressure Mother    Sudden death  Mother    Depression Father    Rheum arthritis Father    Hypertension Father    Bipolar disorder Brother      Social History Mr. Lawrimore reports that he quit smoking about 2 years ago. His smoking use included cigarettes and e-cigarettes. He started smoking about 13 years  ago. He has a 5.00 pack-year smoking history. He has been exposed to tobacco smoke. He has never used smokeless tobacco. Mr. Raines reports that he does not currently use alcohol.   Review of Systems CONSTITUTIONAL: No weight loss, fever, chills, weakness or fatigue.  HEENT: Eyes: No visual loss, blurred vision, double vision or yellow sclerae.No hearing loss, sneezing, congestion, runny nose or sore throat.  SKIN: No rash or itching.  CARDIOVASCULAR:  RESPIRATORY: No shortness of breath, cough or sputum.  GASTROINTESTINAL: No anorexia, nausea, vomiting or diarrhea. No abdominal pain or blood.  GENITOURINARY: No burning on urination, no polyuria NEUROLOGICAL: No headache, dizziness, syncope, paralysis, ataxia, numbness or tingling in the extremities. No change in bowel or bladder control.  MUSCULOSKELETAL: No muscle, back pain, joint pain or stiffness.  LYMPHATICS: No enlarged nodes. No history of splenectomy.  PSYCHIATRIC: No history of depression or anxiety.  ENDOCRINOLOGIC: No reports of sweating, cold or heat intolerance. No polyuria or polydipsia.  Marland Kitchen   Physical Examination There were no vitals filed for this visit. There were no vitals filed for this visit.  Gen: resting comfortably, no acute distress HEENT: no scleral icterus, pupils equal round and reactive, no palptable cervical adenopathy,  CV Resp: Clear to auscultation bilaterally GI: abdomen is soft, non-tender, non-distended, normal bowel sounds, no hepatosplenomegaly MSK: extremities are warm, no edema.  Skin: warm, no rash Neuro:  no focal deficits Psych: appropriate affect   Diagnostic Studies  04/29/14 Echo  Study Conclusions  - Left  ventricle: The cavity size was normal. Wall thickness was increased in a pattern of mild LVH. Systolic function was normal. The estimated ejection fraction was in the range of 60% to 65%. Wall motion was normal; there were no regional wall motion abnormalities. Left ventricular diastolic function parameters were normal. - Mitral valve: There was trivial regurgitation.    11/2012 Echo [place] LVEF 55-60%, no WMAs, no effusion   Assessment and Plan   1. Atypical chest pain - long history of left sided chest pain, has had a prior episode of pericarditis in the past - longterm intermittent symptoms have thought to be inflammatory, perhaps related to her Still's disease - recent benign echo without pericardial effusion, she did not go for ESR and CRP at last visit. She has noticed current symptoms often come on with anxiety, better with klonopin - monitor at this time, if change in symptoms would reorder inflammatory markers. At this time does not appear to be recurrent pericaridits     Arnoldo Lenis, M.D., F.A.C.C.

## 2022-10-24 ENCOUNTER — Ambulatory Visit: Payer: Medicaid Other | Admitting: Bariatrics

## 2022-11-01 ENCOUNTER — Encounter (INDEPENDENT_AMBULATORY_CARE_PROVIDER_SITE_OTHER): Payer: Self-pay

## 2022-11-01 ENCOUNTER — Ambulatory Visit: Payer: Medicaid Other | Admitting: Bariatrics

## 2022-11-25 ENCOUNTER — Encounter: Payer: Self-pay | Admitting: Gastroenterology

## 2022-11-25 NOTE — Progress Notes (Unsigned)
Referring Provider:  Estanislado Pandy, MD Primary Care Physician:  Estanislado Pandy, MD Primary Gastroenterologist:  Dr. Marletta Lor  Chief Complaint  Patient presents with   Gastroesophageal Reflux    Sharp lower abdominal pain. Lots of acid reflux.     HPI:   [name] is a 32 y.o. adult presenting today at the request of  Sasser, Clarene Critchley, MD for GERD.   Reports GERD his entire life. Has been on omeprazole 40 mg daily and occasionally takes a second dose. Eats tums as well. Has been on omeprazole for a long time. Still with breakthrough symptoms almost daily. No nausea or vomiting. No brbpr or melena.   Food is also getting stuck in his throat. Sometimes it feels like foods will get stuck for days and he will get "disgusting burps". Dilation in 2019 was helpful. Symptoms returned about 1 year ago, worsening for the last 6 months. Trouble with solid foods and pills.   Stomach pains for about 1 year. Go as quickly as they come. 3-5 seconds. Increasing in frequency. Epigastric area. Occurs several days a week. No specific triggers.   NSAIDs:  Cut back to 4 a day.  Needs double hip replacements and has sciatic pain.  Gabapentin is helping. Used to take 12 ibuprofen a day.   Bowels are moving well.   EGD and colonoscopy in July 2019 at [facility] to review report. Esophageal, gastric, duodenal biopsies benign.      Past Medical History:  Diagnosis Date   Anxiety attack    panic attacks   Back pain    Bipolar disorder (HCC)    Chest pain    Constipation    Depression    Drug use    Food allergy    Gallbladder problem    GERD (gastroesophageal reflux disease)    Headache    High blood pressure    History of heart attack    History of stomach ulcers    based on symptoms, around age 25   HLD (hyperlipidemia)    Hypothyroidism    Joint pain    Myocardial infarction [facility])    age 68- patient reports it was stopped with nitroglycerine   Osteoarthritis    Pericarditis     RA (rheumatoid arthritis) (HCC)    SOB (shortness of breath)    Still disease, 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HCC)    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HCC)    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HCC)    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HCC)    Swallowing difficulty    Vitamin D deficiency     Past Surgical History:  Procedure Laterality Date   CHOLECYSTECTOMY N/A 08/29/2021   Procedure: LAPAROSCOPIC CHOLECYSTECTOMY WITH ICG;  Surgeon: Andria Meuse, MD;  Location: [facility];  Service: General;  Laterality: N/A;   WISDOM TOOTH EXTRACTION      Current Outpatient Medications  Medication Sig Dispense Refill   amitriptyline (ELAVIL) 50 MG tablet Take 50 mg by mouth at bedtime.     ARIPiprazole (ABILIFY) 10 MG tablet Take 1 tablet (10 mg total) by mouth daily. 30 tablet 0   B-D TB SYRINGE .5CC/27GX1/2" 27G X 1/2" 0.5 ML MISC USE 1 EACH ONCE A WEEK FOR 84 DAYS     BD SAFETYGLIDE NEEDLE 18G X 1-1/2" MISC USE 1 EACH ONCE A WEEK FOR 84 DAYS     clonazePAM (KLONOPIN) 0.5 MG tablet Take 0.5 mg by mouth daily as needed.     gabapentin (NEURONTIN) 300 MG capsule Take 300 mg  by mouth 3 (three) times daily.     ibuprofen (ADVIL) 200 MG tablet Take 800 mg by mouth every 6 (six) hours as needed (For leg pain).     ibuprofen (ADVIL) 800 MG tablet Take 800 mg by mouth every 6 (six) hours as needed.     levothyroxine (SYNTHROID) 25 MCG tablet TAKE 1 TABLET BY MOUTH EVERY DAY 90 tablet 0   metFORMIN (GLUCOPHAGE) 500 MG tablet Take 1 tablet (500 mg total) by mouth 2 (two) times daily with a meal. 180 tablet 3   ondansetron (ZOFRAN) 8 MG tablet Take by mouth.     ondansetron (ZOFRAN-ODT) 4 MG disintegrating tablet Take 4 mg by mouth 2 (two) times daily.     pantoprazole (PROTONIX) 40 MG tablet Take 1 tablet (40 mg total) by mouth daily before breakfast. 30 tablet 3   promethazine (PHENERGAN) 25 MG tablet Take 25 mg by mouth every 6 (six) hours as needed.     rosuvastatin (CRESTOR) 5 MG tablet Take 1 tablet (5 mg total) by mouth daily. 30 tablet 0   testosterone  cypionate (DEPOTESTOSTERONE CYPIONATE) 200 MG/ML injection Inject into the skin.     topiramate (TOPAMAX) 25 MG tablet Take 25 mg by mouth 2 (two) times daily.     TUBERCULIN SYR 1CC/27GX1/2" 27G X 1/2" 1 ML MISC Use 1 each once a week for 84 days     Vitamin D, Ergocalciferol, (DRISDOL) 1.25 MG (50000 UNIT) CAPS capsule Take 1 capsule (50,000 Units total) by mouth every 7 (seven) days. 5 capsule 0   gabapentin (NEURONTIN) 300 MG capsule Take by mouth.     hydrOXYzine (ATARAX) 25 MG tablet Take 1 tablet (25 mg total) by mouth 3 (three) times daily as needed for anxiety. (Patient not taking: Reported on 11/27/2022) 30 tablet 0   No current facility-administered medications for this visit.    Allergies as of 11/27/2022 - Review Complete 11/27/2022  Allergen Reaction Noted   Strawberry extract Itching and Rash 11/16/2012    Family History  Problem Relation Age of Onset   Heart disease Mother    Heart attack Mother    High blood pressure Mother    Sudden death Mother    Depression Father    Rheum arthritis Father    Hypertension Father    Bipolar disorder Brother    Colon cancer Neg Hx     Social History   Socioeconomic History   Marital status: Significant Other    Spouse name: Not on file   Number of children: Not on file   Years of education: Not on file   Highest education level: Not on file  Occupational History   Not on file  Tobacco Use   Smoking status: Former    Packs/day: 1.00    Years: 5.00    Additional pack years: 0.00    Total pack years: 5.00    Types: Cigarettes, E-cigarettes    Start date: 07/27/2009    Quit date: 12/2019    Years since quitting: 2.9    Passive exposure: Current   Smokeless tobacco: Never  Vaping Use   Vaping Use: Every day   Substances: Nicotine, Flavoring  Substance and Sexual Activity   Alcohol use: Not Currently   Drug use: Yes    Types: Marijuana    Comment: daily   Sexual activity: Yes    Partners: Female    Birth  control/protection: None  Other Topics Concern   Not on file  Social History  Narrative   Not on file   Social Determinants of Health   Financial Resource Strain: Not on file  Food Insecurity: Food Insecurity Present (06/01/2022)   Hunger Vital Sign    Worried About Running Out of Food in the Last Year: Sometimes true    Ran Out of Food in the Last Year: Sometimes true  Transportation Needs: No Transportation Needs (06/01/2022)   PRAPARE - Administrator, Civil Service (Medical): No    Lack of Transportation (Non-Medical): No  Physical Activity: Not on file  Stress: Stress Concern Present (03/21/2022)   [employer] of Occupational Health - Occupational Stress Questionnaire    Feeling of Stress : To some extent  Social Connections: Not on file  Intimate Partner Violence: Not on file    Review of Systems: Gen: Denies any fever, chills, cold or flulike symptoms, presyncope, syncope. CV: Denies chest pain, heart palpitations. Resp: Denies shortness of breath, cough. GI: See HPI GU : Denies urinary burning, urinary frequency, urinary hesitancy MS: Admits to back and bilateral hip pain. Derm: Denies rash. Psych: Denies depression, anxiety. Heme: See HPI  Physical Exam: BP 136/82 (BP Location: Left Arm, Patient Position: Sitting, Cuff Size: Large)   Pulse 89   Temp 98.1 F (36.7 C) (Temporal)   Ht 5\' 3"  (1.6 m)   Wt 288 lb 6.4 oz (130.8 kg)   BMI 51.09 kg/m  General:   Alert and oriented. Pleasant and cooperative. Well-nourished and well-developed.  Head:  Normocephalic and atraumatic. Eyes:  Without icterus, sclera clear and conjunctiva pink.  Ears:  Normal auditory acuity. Lungs:  Clear to auscultation bilaterally. No wheezes, rales, or rhonchi. No distress.  Heart:  S1, S2 present without murmurs appreciated.  Abdomen:  +BS, soft, non-tender and non-distended. No HSM noted. No guarding or rebound. No masses appreciated.  Rectal:  Deferred  Msk:   Symmetrical without gross deformities. Normal posture. Extremities:  Without edema. Neurologic:  Alert and  oriented x4;  grossly normal neurologically. Skin:  Intact without significant lesions or rashes. Psych:  Normal mood and affect.    Assessment:  32 year old transmale with history of RA, Stills disease, HTN, HLD, hypothyroidism, depression, anxiety, bipolar disorder, ?  MI, GERD, presenting today for further evaluation of GERD at the request of Dr. Neita Carp.  Also reporting epigastric abdominal pain and dysphagia.  GERD: Chronic.  Not adequately managed on omeprazole 40 mg daily.  Occasionally taking a second dose of omeprazole and or Tums.  Will change omeprazole to pantoprazole.  Will also arrange an upper endoscopy to further evaluate symptoms.  Dysphagia: Intermittent solid food and pill dysphagia over the last year, worsening over the last 6 months.  Reports similar symptoms in the past that improved with esophageal dilation in 2019 at [facility].  Query esophageal web, ring, stricture in the setting of uncontrolled GERD.  We will arrange for an EGD in the near future with possible dilation.  Epigastric abdominal pain: Intermittent short-lived sharp epigastric pain without identified trigger.  This is in the setting of chronic, uncontrolled GERD as well as daily NSAID use.  Differentials include gastritis, duodenitis, PUD, H. pylori.  Planning for an EGD in the near future to further evaluate symptoms.   Plan:  Proceed with upper endoscopy with propofol by Dr. Marletta Lor in near future. The risks, benefits, and alternatives have been discussed with the patient in detail. The patient states understanding and desires to proceed.  ASA 3 UPT No AM DM meds day of procedure.  Stop omeprazole. Start pantoprazole 40 mg daily. Limit NSAIDs is much as possible. Use Tylenol first for pain.  More than 3000 mg of Tylenol per 24 hours. Swallowing precautions discussed.  Written instructions provided  on AVS. Follow-up after EGD.   Ermalinda Memos, PA-C [facility] 11/27/2022

## 2022-11-25 NOTE — H&P (View-Only) (Signed)
Referring Provider:  Estanislado Pandy, MD Primary Care Physician:  Estanislado Pandy, MD Primary Gastroenterologist:  Dr. Marletta Lor  Chief Complaint  Patient presents with   Gastroesophageal Reflux    Sharp lower abdominal pain. Lots of acid reflux.     HPI:   [name] is a 32 y.o. adult presenting today at the request of  Sasser, Clarene Critchley, MD for GERD.   Reports GERD his entire life. Has been on omeprazole 40 mg daily and occasionally takes a second dose. Eats tums as well. Has been on omeprazole for a long time. Still with breakthrough symptoms almost daily. No nausea or vomiting. No brbpr or melena.   Food is also getting stuck in his throat. Sometimes it feels like foods will get stuck for days and he will get "disgusting burps". Dilation in 2019 was helpful. Symptoms returned about 1 year ago, worsening for the last 6 months. Trouble with solid foods and pills.   Stomach pains for about 1 year. Go as quickly as they come. 3-5 seconds. Increasing in frequency. Epigastric area. Occurs several days a week. No specific triggers.   NSAIDs:  Cut back to 4 a day.  Needs double hip replacements and has sciatic pain.  Gabapentin is helping. Used to take 12 ibuprofen a day.   Bowels are moving well.   EGD and colonoscopy in July 2019 at [facility] to review report. Esophageal, gastric, duodenal biopsies benign.      Past Medical History:  Diagnosis Date   Anxiety attack    panic attacks   Back pain    Bipolar disorder (HCC)    Chest pain    Constipation    Depression    Drug use    Food allergy    Gallbladder problem    GERD (gastroesophageal reflux disease)    Headache    High blood pressure    History of heart attack    History of stomach ulcers    based on symptoms, around age 76   HLD (hyperlipidemia)    Hypothyroidism    Joint pain    Myocardial infarction [facility])    age 81- patient reports it was stopped with nitroglycerine   Osteoarthritis    Pericarditis     RA (rheumatoid arthritis) (HCC)    SOB (shortness of breath)    Still disease, 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HCC)    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HCC)    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HCC)    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HCC)    Swallowing difficulty    Vitamin D deficiency     Past Surgical History:  Procedure Laterality Date   CHOLECYSTECTOMY N/A 08/29/2021   Procedure: LAPAROSCOPIC CHOLECYSTECTOMY WITH ICG;  Surgeon: Andria Meuse, MD;  Location: [facility];  Service: General;  Laterality: N/A;   WISDOM TOOTH EXTRACTION      Current Outpatient Medications  Medication Sig Dispense Refill   amitriptyline (ELAVIL) 50 MG tablet Take 50 mg by mouth at bedtime.     ARIPiprazole (ABILIFY) 10 MG tablet Take 1 tablet (10 mg total) by mouth daily. 30 tablet 0   B-D TB SYRINGE .5CC/27GX1/2" 27G X 1/2" 0.5 ML MISC USE 1 EACH ONCE A WEEK FOR 84 DAYS     BD SAFETYGLIDE NEEDLE 18G X 1-1/2" MISC USE 1 EACH ONCE A WEEK FOR 84 DAYS     clonazePAM (KLONOPIN) 0.5 MG tablet Take 0.5 mg by mouth daily as needed.     gabapentin (NEURONTIN) 300 MG capsule Take 300 mg  by mouth 3 (three) times daily.     ibuprofen (ADVIL) 200 MG tablet Take 800 mg by mouth every 6 (six) hours as needed (For leg pain).     ibuprofen (ADVIL) 800 MG tablet Take 800 mg by mouth every 6 (six) hours as needed.     levothyroxine (SYNTHROID) 25 MCG tablet TAKE 1 TABLET BY MOUTH EVERY DAY 90 tablet 0   metFORMIN (GLUCOPHAGE) 500 MG tablet Take 1 tablet (500 mg total) by mouth 2 (two) times daily with a meal. 180 tablet 3   ondansetron (ZOFRAN) 8 MG tablet Take by mouth.     ondansetron (ZOFRAN-ODT) 4 MG disintegrating tablet Take 4 mg by mouth 2 (two) times daily.     pantoprazole (PROTONIX) 40 MG tablet Take 1 tablet (40 mg total) by mouth daily before breakfast. 30 tablet 3   promethazine (PHENERGAN) 25 MG tablet Take 25 mg by mouth every 6 (six) hours as needed.     rosuvastatin (CRESTOR) 5 MG tablet Take 1 tablet (5 mg total) by mouth daily. 30 tablet 0   testosterone  cypionate (DEPOTESTOSTERONE CYPIONATE) 200 MG/ML injection Inject into the skin.     topiramate (TOPAMAX) 25 MG tablet Take 25 mg by mouth 2 (two) times daily.     TUBERCULIN SYR 1CC/27GX1/2" 27G X 1/2" 1 ML MISC Use 1 each once a week for 84 days     Vitamin D, Ergocalciferol, (DRISDOL) 1.25 MG (50000 UNIT) CAPS capsule Take 1 capsule (50,000 Units total) by mouth every 7 (seven) days. 5 capsule 0   gabapentin (NEURONTIN) 300 MG capsule Take by mouth.     hydrOXYzine (ATARAX) 25 MG tablet Take 1 tablet (25 mg total) by mouth 3 (three) times daily as needed for anxiety. (Patient not taking: Reported on 11/27/2022) 30 tablet 0   No current facility-administered medications for this visit.    Allergies as of 11/27/2022 - Review Complete 11/27/2022  Allergen Reaction Noted   Strawberry extract Itching and Rash 11/16/2012    Family History  Problem Relation Age of Onset   Heart disease Mother    Heart attack Mother    High blood pressure Mother    Sudden death Mother    Depression Father    Rheum arthritis Father    Hypertension Father    Bipolar disorder Brother    Colon cancer Neg Hx     Social History   Socioeconomic History   Marital status: Significant Other    Spouse name: Not on file   Number of children: Not on file   Years of education: Not on file   Highest education level: Not on file  Occupational History   Not on file  Tobacco Use   Smoking status: Former    Packs/day: 1.00    Years: 5.00    Additional pack years: 0.00    Total pack years: 5.00    Types: Cigarettes, E-cigarettes    Start date: 07/27/2009    Quit date: 12/2019    Years since quitting: 2.9    Passive exposure: Current   Smokeless tobacco: Never  Vaping Use   Vaping Use: Every day   Substances: Nicotine, Flavoring  Substance and Sexual Activity   Alcohol use: Not Currently   Drug use: Yes    Types: Marijuana    Comment: daily   Sexual activity: Yes    Partners: Female    Birth  control/protection: None  Other Topics Concern   Not on file  Social History  Narrative   Not on file   Social Determinants of Health   Financial Resource Strain: Not on file  Food Insecurity: Food Insecurity Present (06/01/2022)   Hunger Vital Sign    Worried About Running Out of Food in the Last Year: Sometimes true    Ran Out of Food in the Last Year: Sometimes true  Transportation Needs: No Transportation Needs (06/01/2022)   PRAPARE - Administrator, Civil Service (Medical): No    Lack of Transportation (Non-Medical): No  Physical Activity: Not on file  Stress: Stress Concern Present (03/21/2022)   [employer] of Occupational Health - Occupational Stress Questionnaire    Feeling of Stress : To some extent  Social Connections: Not on file  Intimate Partner Violence: Not on file    Review of Systems: Gen: Denies any fever, chills, cold or flulike symptoms, presyncope, syncope. CV: Denies chest pain, heart palpitations. Resp: Denies shortness of breath, cough. GI: See HPI GU : Denies urinary burning, urinary frequency, urinary hesitancy MS: Admits to back and bilateral hip pain. Derm: Denies rash. Psych: Denies depression, anxiety. Heme: See HPI  Physical Exam: BP 136/82 (BP Location: Left Arm, Patient Position: Sitting, Cuff Size: Large)   Pulse 89   Temp 98.1 F (36.7 C) (Temporal)   Ht 5\' 3"  (1.6 m)   Wt 288 lb 6.4 oz (130.8 kg)   BMI 51.09 kg/m  General:   Alert and oriented. Pleasant and cooperative. Well-nourished and well-developed.  Head:  Normocephalic and atraumatic. Eyes:  Without icterus, sclera clear and conjunctiva pink.  Ears:  Normal auditory acuity. Lungs:  Clear to auscultation bilaterally. No wheezes, rales, or rhonchi. No distress.  Heart:  S1, S2 present without murmurs appreciated.  Abdomen:  +BS, soft, non-tender and non-distended. No HSM noted. No guarding or rebound. No masses appreciated.  Rectal:  Deferred  Msk:   Symmetrical without gross deformities. Normal posture. Extremities:  Without edema. Neurologic:  Alert and  oriented x4;  grossly normal neurologically. Skin:  Intact without significant lesions or rashes. Psych:  Normal mood and affect.    Assessment:  32 year old transmale with history of RA, Stills disease, HTN, HLD, hypothyroidism, depression, anxiety, bipolar disorder, ?  MI, GERD, presenting today for further evaluation of GERD at the request of Dr. Neita Carp.  Also reporting epigastric abdominal pain and dysphagia.  GERD: Chronic.  Not adequately managed on omeprazole 40 mg daily.  Occasionally taking a second dose of omeprazole and or Tums.  Will change omeprazole to pantoprazole.  Will also arrange an upper endoscopy to further evaluate symptoms.  Dysphagia: Intermittent solid food and pill dysphagia over the last year, worsening over the last 6 months.  Reports similar symptoms in the past that improved with esophageal dilation in 2019 at [facility].  Query esophageal web, ring, stricture in the setting of uncontrolled GERD.  We will arrange for an EGD in the near future with possible dilation.  Epigastric abdominal pain: Intermittent short-lived sharp epigastric pain without identified trigger.  This is in the setting of chronic, uncontrolled GERD as well as daily NSAID use.  Differentials include gastritis, duodenitis, PUD, H. pylori.  Planning for an EGD in the near future to further evaluate symptoms.   Plan:  Proceed with upper endoscopy with propofol by Dr. Marletta Lor in near future. The risks, benefits, and alternatives have been discussed with the patient in detail. The patient states understanding and desires to proceed.  ASA 3 UPT No AM DM meds day of procedure.  Stop omeprazole. Start pantoprazole 40 mg daily. Limit NSAIDs is much as possible. Use Tylenol first for pain.  More than 3000 mg of Tylenol per 24 hours. Swallowing precautions discussed.  Written instructions provided  on AVS. Follow-up after EGD.   Ermalinda Memos, PA-C [facility] Gastroenterology 11/27/2022

## 2022-11-27 ENCOUNTER — Encounter: Payer: Self-pay | Admitting: Gastroenterology

## 2022-11-27 ENCOUNTER — Encounter: Payer: Self-pay | Admitting: *Deleted

## 2022-11-27 ENCOUNTER — Ambulatory Visit (INDEPENDENT_AMBULATORY_CARE_PROVIDER_SITE_OTHER): Payer: Medicaid Other | Admitting: Gastroenterology

## 2022-11-27 VITALS — BP 136/82 | HR 89 | Temp 98.1°F | Ht 63.0 in | Wt 288.4 lb

## 2022-11-27 DIAGNOSIS — R1013 Epigastric pain: Secondary | ICD-10-CM

## 2022-11-27 DIAGNOSIS — K219 Gastro-esophageal reflux disease without esophagitis: Secondary | ICD-10-CM | POA: Diagnosis not present

## 2022-11-27 DIAGNOSIS — R1310 Dysphagia, unspecified: Secondary | ICD-10-CM

## 2022-11-27 MED ORDER — PANTOPRAZOLE SODIUM 40 MG PO TBEC: 40.0000 mg | DELAYED_RELEASE_TABLET | Freq: Every day | ORAL | 3 refills | Status: DC

## 2022-11-27 NOTE — Patient Instructions (Addendum)
We will arrange for you to have an upper endoscopy with possible stretching of your esophagus in the near future with Dr. Marletta Lor.  Stop omeprazole.   Start pantoprazole 40 mg daily 30 minutes before breakfast.  Limit NSAID products is much as possible.  Try to use Tylenol first for pain.  No more than 3000 mg of Tylenol per 24 hours.  Swallowing precautions:  Eat slowly, take small bites, chew thoroughly, drink plenty of liquids throughout meals.  Avoid trough textures All meats should be chopped finely.  If something gets hung in your esophagus and will not come up or go down, proceed to the emergency room.    It was good to meet you today!  Will plan to see you back after your upper endoscopy.  Ermalinda Memos, PA-C [facility] Gastroenterology

## 2022-12-07 ENCOUNTER — Ambulatory Visit (INDEPENDENT_AMBULATORY_CARE_PROVIDER_SITE_OTHER): Payer: Medicaid Other | Admitting: Family Medicine

## 2022-12-07 ENCOUNTER — Encounter: Payer: Self-pay | Admitting: Family Medicine

## 2022-12-07 VITALS — BP 118/83 | HR 91 | Ht 63.0 in | Wt 284.1 lb

## 2022-12-07 DIAGNOSIS — E559 Vitamin D deficiency, unspecified: Secondary | ICD-10-CM | POA: Diagnosis not present

## 2022-12-07 DIAGNOSIS — E039 Hypothyroidism, unspecified: Secondary | ICD-10-CM

## 2022-12-07 DIAGNOSIS — Z23 Encounter for immunization: Secondary | ICD-10-CM

## 2022-12-07 DIAGNOSIS — R7301 Impaired fasting glucose: Secondary | ICD-10-CM

## 2022-12-07 DIAGNOSIS — Z6841 Body Mass Index (BMI) 40.0 and over, adult: Secondary | ICD-10-CM

## 2022-12-07 DIAGNOSIS — E782 Mixed hyperlipidemia: Secondary | ICD-10-CM | POA: Diagnosis not present
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
## 2022-12-07 NOTE — Patient Instructions (Addendum)
I appreciate the opportunity to provide care to you today!    Follow up:  2 weeks for pap smear and 1 month for weight loss  Labs: please stop by the lab during the week  to get your blood drawn (CBC, CMP, TSH, Lipid profile, HgA1c, Vit D)  Healthy tips for weight loss  Eat three meals per day at times discussed. Cut out all diet bevergages and drink only water Eat whole food plant based meals Cut out junk food, fast food and processed foods Exercise 150 minutes a week Lose 1-2 lbs per week. Keep a food journal Choose foods that grow in a garden or in a fruit orchard and protein of animals with fins or feathers.  Lifestyle Medicine - Whole Food, Plant Predominant Nutrition is highly recommended: Eat Plenty of vegetables, Mushrooms, fruits, Legumes, Whole Grains, Nuts, seeds in lieu of processed meats, processed snacks/pastries red meat, poultry, eggs.   -It is better to avoid simple carbohydrates including: Cakes, Sweet Desserts, Ice Cream, Soda (diet and regular), Sweet Tea, Candies, Chips, Cookies, Store Bought Juices, Alcohol in Excess of  1-2 drinks a day, Lemonade,  Artificial Sweeteners, Doughnuts, Coffee Creamers, "Sugar-free" Products, etc, etc.  This is not a complete list..... Exercise: If you are able: 30 -60 minutes a day ,4 days a week, or 150 minutes a week.  The longer the better.  Combine stretch, strength, and aerobic activities.  If you were told in the past that you have high risk for cardiovascular diseases, you may seek evaluation by your heart doctor prior to initiating moderate to intense exercise programs.    Please continue to a heart-healthy diet and increase your physical activities. Try to exercise for at least five days a week.      It was a pleasure to see you and I look forward to continuing to work together on your health and well-being. Please do not hesitate to call the office if you need care or have questions about your care.   Have a wonderful  day and week. With Gratitude, Gilmore Laroche MSN, FNP-BC
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
## 2022-12-16 ENCOUNTER — Telehealth: Payer: Medicaid Other | Admitting: Nurse Practitioner

## 2022-12-16 DIAGNOSIS — K2970 Gastritis, unspecified, without bleeding: Secondary | ICD-10-CM

## 2022-12-16 MED ORDER — ONDANSETRON HCL 4 MG PO TABS: 4.0000 mg | ORAL_TABLET | Freq: Three times a day (TID) | ORAL | 0 refills | Status: DC | PRN

## 2022-12-16 NOTE — Progress Notes (Signed)
Virtual Visit Consent   [name], you are scheduled for a virtual visit with Kathryn Daphine Deutscher, FNP, a [facility] provider, today.     Just as with appointments in the office, your consent must be obtained to participate.  Your consent will be active for this visit and any virtual visit you may have with one of our providers in the next 365 days.     If you have a MyChart account, a copy of this consent can be sent to you electronically.  All virtual visits are billed to your insurance company just like a traditional visit in the office.    As this is a virtual visit, video technology does not allow for your provider to perform a traditional examination.  This may limit your provider's ability to fully assess your condition.  If your provider identifies any concerns that need to be evaluated in person or the need to arrange testing (such as labs, EKG, etc.), we will make arrangements to do so.     Although advances in technology are sophisticated, we cannot ensure that it will always work on either your end or our end.  If the connection with a video visit is poor, the visit may have to be switched to a telephone visit.  With either a video or telephone visit, we are not always able to ensure that we have a secure connection.     I need to obtain your verbal consent now.   Are you willing to proceed with your visit today? YES   Jaedan M Crumpler has provided verbal consent on 12/16/2022 for a virtual visit (video or telephone).   Kathryn Daphine Deutscher, FNP   Date: 12/16/2022 5:49 PM   Virtual Visit via Video Note   I, [name], connected with [name] ([id number], [birth year]) on 12/16/22 at  5:30 PM EDT by a video-enabled telemedicine application and verified that I am speaking with the correct person using two identifiers.  Location: Patient: Virtual Visit Location Patient: Home Provider: Virtual Visit Location Provider: Mobile   I discussed the limitations  of evaluation and management by telemedicine and the availability of in person appointments. The patient expressed understanding and agreed to proceed.    History of Present Illness: [name] is a 32 y.o. who identifies as a transgender female who was assigned adult at birth, and is being seen today for nausea and vomiting  HPI: Emesis  This is a new problem. The problem has been waxing and waning. There has been no fever. Pertinent negatives include no diarrhea or myalgias. He has tried nothing for the symptoms.    Review of Systems  Gastrointestinal:  Positive for vomiting. Negative for diarrhea.  Musculoskeletal:  Negative for myalgias.    Problems:  Patient Active Problem List   Diagnosis Date Noted   BMI 50.0-59.9, adult (HCC) 09/19/2022   OSA on CPAP 08/30/2022   Other specified hypothyroidism 08/22/2022   Elevated cholesterol 08/22/2022   Insulin resistance 07/18/2022   History of tachycardia 06/06/2022   Elevated ALT measurement 06/06/2022   SOB (shortness of breath) on exertion 05/23/2022   Health care maintenance 05/23/2022   Metabolic syndrome 05/23/2022   Vitamin B12 deficiency 05/23/2022   Acquired hypothyroidism 05/09/2022   Mixed hyperlipidemia 05/09/2022   Low HDL (under 40) 05/09/2022   Elevated glucose 05/09/2022   Eating disorder 05/09/2022   Other fatigue 05/09/2022   Class 3 severe obesity with serious comorbidity and body mass index (BMI) of 45.0  to 49.9 in adult [facility]) 05/09/2022   Lumbar degenerative disc disease 03/13/2022   Bipolar depression (HCC) 03/06/2022   Chronic bilateral low back pain with right-sided sciatica 02/15/2022   Vitamin D deficiency 01/04/2022   Loud snoring 01/04/2022   GAD (generalized anxiety disorder) 01/04/2022   Marijuana use 01/04/2022   Chest pain 12/25/2021   Transaminitis 12/25/2021   GERD without esophagitis 12/25/2021   Elevated lipase 12/25/2021   Compulsive skin picking 07/28/2020   Social phobia 07/28/2020    Bipolar I disorder, current or most recent episode depressed, in partial remission (HCC) 07/19/2020   Bilateral primary osteoarthritis of hip 07/19/2020   Allergic rhinitis due to pollen 05/12/2020   MDD (major depressive disorder), recurrent episode, severe (HCC) 05/02/2019   High risk medication use 05/03/2017   Tobacco use 05/29/2016   Arthralgia of multiple sites, bilateral 03/06/2016   Morbid obesity (HCC) 11/05/2014   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   Insomnia 02/18/2014   Bipolar affective disorder, currently depressed, moderate (HCC) 05/25/2011   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    Allergies:  Allergies  Allergen Reactions   Strawberry Extract Itching and Rash   Medications:  Current Outpatient Medications:    amitriptyline (ELAVIL) 50 MG tablet, Take 50 mg by mouth at bedtime., Disp: , Rfl:    ARIPiprazole (ABILIFY) 10 MG tablet, Take 1 tablet (10 mg total) by mouth daily., Disp: 30 tablet, Rfl: 0   B-D TB SYRINGE .5CC/27GX1/2" 27G X 1/2" 0.5 ML MISC, USE 1 EACH ONCE A WEEK FOR 84 DAYS, Disp: , Rfl:    BD SAFETYGLIDE NEEDLE 18G X 1-1/2" MISC, USE 1 EACH ONCE A WEEK FOR 84 DAYS, Disp: , Rfl:    clonazePAM (KLONOPIN) 0.5 MG tablet, Take 0.5 mg by mouth daily as needed., Disp: , Rfl:    gabapentin (NEURONTIN) 300 MG capsule, Take 300 mg by mouth 3 (three) times daily., Disp: , Rfl:    ibuprofen (ADVIL) 200 MG tablet, Take 800 mg by mouth every 6 (six) hours as needed (For leg pain)., Disp: , Rfl:    levothyroxine (SYNTHROID) 25 MCG tablet, TAKE 1 TABLET BY MOUTH EVERY DAY, Disp: 90 tablet, Rfl: 0   metFORMIN (GLUCOPHAGE) 500 MG tablet, Take 1 tablet (500 mg total) by mouth 2 (two) times daily with a meal., Disp: 180 tablet, Rfl: 3   ondansetron (ZOFRAN-ODT) 4 MG disintegrating tablet, Take 4 mg by mouth 2 (two) times daily., Disp: , Rfl:    pantoprazole (PROTONIX) 40 MG tablet, Take 1 tablet (40 mg total) by mouth daily before breakfast., Disp: 30 tablet, Rfl:  3   promethazine (PHENERGAN) 25 MG tablet, Take 25 mg by mouth every 6 (six) hours as needed., Disp: , Rfl:    rosuvastatin (CRESTOR) 5 MG tablet, Take 1 tablet (5 mg total) by mouth daily., Disp: 30 tablet, Rfl: 0   testosterone cypionate (DEPOTESTOSTERONE CYPIONATE) 200 MG/ML injection, Inject into the skin. 0.3mg  once a week, Disp: , Rfl:    topiramate (TOPAMAX) 25 MG tablet, Take 25 mg by mouth 2 (two) times daily., Disp: , Rfl:    TUBERCULIN SYR 1CC/27GX1/2" 27G X 1/2" 1 ML MISC, Use 1 each once a week for 84 days, Disp: , Rfl:    Vitamin D, Ergocalciferol, (DRISDOL) 1.25 MG (50000 UNIT) CAPS capsule, Take 1 capsule (50,000 Units total) by mouth every 7 (seven) days., Disp: 5 capsule, Rfl: 0  Observations/Objective: Patient is well-developed, well-nourished in no acute distress.  Resting comfortably  at home.  Head is normocephalic, atraumatic.  No labored breathing.  Speech is clear and coherent with logical content.  Patient is alert and oriented at baseline.    Assessment and Plan:  [name] in today with chief complaint of Emesis   1. Viral gastritis First 24 Hours-Clear liquids  popsicles  Jello  gatorade  Sprite Second 24 hours-Add Full liquids ( Liquids you cant see through) Third 24 hours- Bland diet ( foods that are baked or broiled)  *avoiding fried foods and highly spiced foods* During these 3 days  Avoid milk, cheese, ice cream or any other dairy products  Avoid caffeine- REMEMBER Mt. Dew and Mello Yellow contain lots of caffeine You should eat and drink in  Frequent small volumes If no improvement in symptoms or worsen in 2-3 days should RETRUN TO OFFICE or go to ER!   Meds ordered this encounter  Medications   ondansetron (ZOFRAN) 4 MG tablet    Sig: Take 1 tablet (4 mg total) by mouth every 8 (eight) hours as needed for nausea or vomiting.    Dispense:  20 tablet    Refill:  0    Order Specific Question:   Supervising Provider    Answer:    Merrilee Jansky [id number]      Follow Up Instructions: I discussed the assessment and treatment plan with the patient. The patient was provided an opportunity to ask questions and all were answered. The patient agreed with the plan and demonstrated an understanding of the instructions.  A copy of instructions were sent to the patient via MyChart.  The patient was advised to call back or seek an in-person evaluation if the symptoms worsen or if the condition fails to improve as anticipated.  Time:  I spent 11 minutes with the patient via telehealth technology discussing the above problems/concerns.    Kathryn Daphine Deutscher, FNP

## 2022-12-18 NOTE — Patient Instructions (Signed)
[name]  12/18/2022     @PREFPERIOPPHARMACY @   Your procedure is scheduled on  12/22/2022.   Report to [name] at  1130  A.M.   Call this number if you have problems the morning of surgery:  [phone]  If you experience any cold or flu symptoms such as cough, fever, chills, shortness of breath, etc. between now and your scheduled surgery, please notify us at the above number.   Remember:  Follow the diet and prep instructions given to you by the office.     DO NOT take any medications for diabetes the morning of your procedure.     Take these medicines the morning of surgery with A SIP OF WATER       abilify, clonazepam, gabapentin, levothyroxine, zofran(if needed), pantoprazole.     Do not wear jewelry, make-up or nail polish.  Do not wear lotions, powders, or perfumes, or deodorant.  Do not shave 48 hours prior to surgery.  Men may shave face and neck.  Do not bring valuables to the hospital.  [facility] is not responsible for any belongings or valuables.  Contacts, dentures or bridgework may not be worn into surgery.  Leave your suitcase in the car.  After surgery it may be brought to your room.  For patients admitted to the hospital, discharge time will be determined by your treatment team.  Patients discharged the day of surgery will not be allowed to drive home.    Special instructions:   DO NOT smoke tobacco or vape for 24 hours before your procedure.  Please read over the following fact sheets that you were given. Anesthesia Post-op Instructions and Care and Recovery After Surgery      Upper Endoscopy, Adult, Care After After the procedure, it is common to have a sore throat. It is also common to have: Mild stomach pain or discomfort. Bloating. Nausea. Follow these instructions at home: The instructions below may help you care for yourself at home. Your health care provider may give you more instructions. If you have questions,  ask your health care provider. If you were given a sedative during the procedure, it can affect you for several hours. Do not drive or operate machinery until your health care provider says that it is safe. If you will be going home right after the procedure, plan to have a responsible adult: Take you home from the hospital or clinic. You will not be allowed to drive. Care for you for the time you are told. Follow instructions from your health care provider about what you may eat and drink. Return to your normal activities as told by your health care provider. Ask your health care provider what activities are safe for you. Take over-the-counter and prescription medicines only as told by your health care provider. Contact a health care provider if you: Have a sore throat that lasts longer than one day. Have trouble swallowing. Have a fever. Get help right away if you: Vomit blood or your vomit looks like coffee grounds. Have bloody, black, or tarry stools. Have a very bad sore throat or you cannot swallow. Have difficulty breathing or very bad pain in your chest or abdomen. These symptoms may be an emergency. Get help right away. Call 911. Do not wait to see if the symptoms will go away. Do not drive yourself to the hospital. Summary After the procedure, it is common to have a sore throat, mild  stomach discomfort, bloating, and nausea. If you were given a sedative during the procedure, it can affect you for several hours. Do not drive until your health care provider says that it is safe. Follow instructions from your health care provider about what you may eat and drink. Return to your normal activities as told by your health care provider. This information is not intended to replace advice given to you by your health care provider. Make sure you discuss any questions you have with your health care provider. Document Revised: 10/26/2021 Document Reviewed: 10/26/2021 Elsevier Patient  Education  2023 [employer] Esophageal Dilatation Esophageal dilatation, also called esophageal dilation, is a procedure to widen or open a blocked or narrowed part of the esophagus. The esophagus is the part of the body that moves food and liquid from the mouth to the stomach. You may need this procedure if: You have a buildup of scar tissue in your esophagus that makes it difficult, painful, or impossible to swallow. This can be caused by gastroesophageal reflux disease (GERD). You have cancer of the esophagus. There is a problem with how food moves through your esophagus. In some cases, you may need this procedure repeated at a later time to dilate the esophagus gradually. Tell a health care provider about: Any allergies you have. All medicines you are taking, including vitamins, herbs, eye drops, creams, and over-the-counter medicines. Any problems you or family members have had with anesthetic medicines. Any blood disorders you have. Any surgeries you have had. Any medical conditions you have. Any antibiotic medicines you are required to take before dental procedures. Whether you are pregnant or may be pregnant. What are the risks? Generally, this is a safe procedure. However, problems may occur, including: Bleeding due to a tear in the lining of the esophagus. A hole, or perforation, in the esophagus. What happens before the procedure? Ask your health care provider about: Changing or stopping your regular medicines. This is especially important if you are taking diabetes medicines or blood thinners. Taking medicines such as aspirin and ibuprofen. These medicines can thin your blood. Do not take these medicines unless your health care provider tells you to take them. Taking over-the-counter medicines, vitamins, herbs, and supplements. Follow instructions from your health care provider about eating or drinking restrictions. Plan to have a responsible adult take you home from the  hospital or clinic. Plan to have a responsible adult care for you for the time you are told after you leave the hospital or clinic. This is important. What happens during the procedure? You may be given a medicine to help you relax (sedative). A numbing medicine may be sprayed into the back of your throat, or you may gargle the medicine. Your health care provider may perform the dilatation using various surgical instruments, such as: Simple dilators. This instrument is carefully placed in the esophagus to stretch it. Guided wire bougies. This involves using an endoscope to insert a wire into the esophagus. A dilator is passed over this wire to enlarge the esophagus. Then the wire is removed. Balloon dilators. An endoscope with a small balloon is inserted into the esophagus. The balloon is inflated to stretch the esophagus and open it up. The procedure may vary among health care providers and hospitals. What can I expect after the procedure? Your blood pressure, heart rate, breathing rate, and blood oxygen level will be monitored until you leave the hospital or clinic. Your throat may feel slightly sore and numb. This will get better  over time. You will not be allowed to eat or drink until your throat is no longer numb. When you are able to drink, urinate, and sit on the edge of the bed without nausea or dizziness, you may be able to return home. Follow these instructions at home: Take over-the-counter and prescription medicines only as told by your health care provider. If you were given a sedative during the procedure, it can affect you for several hours. Do not drive or operate machinery until your health care provider says that it is safe. Plan to have a responsible adult care for you for the time you are told. This is important. Follow instructions from your health care provider about any eating or drinking restrictions. Do not use any products that contain nicotine or tobacco, such as  cigarettes, e-cigarettes, and chewing tobacco. If you need help quitting, ask your health care provider. Keep all follow-up visits. This is important. Contact a health care provider if: You have a fever. You have pain that is not relieved by medicine. Get help right away if: You have chest pain. You have trouble breathing. You have trouble swallowing. You vomit blood. You have black, tarry, or bloody stools. These symptoms may represent a serious problem that is an emergency. Do not wait to see if the symptoms will go away. Get medical help right away. Call your local emergency services (911 in the [place] Do not drive yourself to the hospital. Summary Esophageal dilatation, also called esophageal dilation, is a procedure to widen or open a blocked or narrowed part of the esophagus. Plan to have a responsible adult take you home from the hospital or clinic. For this procedure, a numbing medicine may be sprayed into the back of your throat, or you may gargle the medicine. Do not drive or operate machinery until your health care provider says that it is safe. This information is not intended to replace advice given to you by your health care provider. Make sure you discuss any questions you have with your health care provider. Document Revised: 12/03/2019 Document Reviewed: 12/03/2019 Elsevier Patient Education  2023 [employer] Monitored Anesthesia Care, Care After The following information offers guidance on how to care for yourself after your procedure. Your health care provider may also give you more specific instructions. If you have problems or questions, contact your health care provider. What can I expect after the procedure? After the procedure, it is common to have: Tiredness. Little or no memory about what happened during or after the procedure. Impaired judgment when it comes to making decisions. Nausea or vomiting. Some trouble with balance. Follow these instructions at  home: For the time period you were told by your health care provider:  Rest. Do not participate in activities where you could fall or become injured. Do not drive or use machinery. Do not drink alcohol. Do not take sleeping pills or medicines that cause drowsiness. Do not make important decisions or sign legal documents. Do not take care of children on your own. Medicines Take over-the-counter and prescription medicines only as told by your health care provider. If you were prescribed antibiotics, take them as told by your health care provider. Do not stop using the antibiotic even if you start to feel better. Eating and drinking Follow instructions from your health care provider about what you may eat and drink. Drink enough fluid to keep your urine pale yellow. If you vomit: Drink clear fluids slowly and in small amounts as you are able.  Clear fluids include water, ice chips, low-calorie sports drinks, and fruit juice that has water added to it (diluted fruit juice). Eat light and bland foods in small amounts as you are able. These foods include bananas, applesauce, rice, lean meats, toast, and crackers. General instructions  Have a responsible adult stay with you for the time you are told. It is important to have someone help care for you until you are awake and alert. If you have sleep apnea, surgery and some medicines can increase your risk for breathing problems. Follow instructions from your health care provider about wearing your sleep device: When you are sleeping. This includes during daytime naps. While taking prescription pain medicines, sleeping medicines, or medicines that make you drowsy. Do not use any products that contain nicotine or tobacco. These products include cigarettes, chewing tobacco, and vaping devices, such as e-cigarettes. If you need help quitting, ask your health care provider. Contact a health care provider if: You feel nauseous or vomit every time you eat  or drink. You feel light-headed. You are still sleepy or having trouble with balance after 24 hours. You get a rash. You have a fever. You have redness or swelling around the IV site. Get help right away if: You have trouble breathing. You have new confusion after you get home. These symptoms may be an emergency. Get help right away. Call 911. Do not wait to see if the symptoms will go away. Do not drive yourself to the hospital. This information is not intended to replace advice given to you by your health care provider. Make sure you discuss any questions you have with your health care provider. Document Revised: 12/12/2021 Document Reviewed: 12/12/2021 Elsevier Patient Education  2023 [employer].

## 2022-12-20 ENCOUNTER — Encounter (HOSPITAL_COMMUNITY): Payer: Self-pay

## 2022-12-20 ENCOUNTER — Encounter (HOSPITAL_COMMUNITY)
Admission: RE | Admit: 2022-12-20 | Discharge: 2022-12-20 | Disposition: A | Discharge status: Home / Self Care | Payer: Medicare Other | Source: Ambulatory Visit | Attending: Internal Medicine | Admitting: Internal Medicine

## 2022-12-20 VITALS — BP 129/85 | HR 91 | Temp 97.7°F | Resp 18 | Ht 63.0 in | Wt 284.1 lb

## 2022-12-20 DIAGNOSIS — Z01812 Encounter for preprocedural laboratory examination: Secondary | ICD-10-CM | POA: Diagnosis present

## 2022-12-20 DIAGNOSIS — E88819 Insulin resistance, unspecified: Secondary | ICD-10-CM | POA: Diagnosis not present

## 2022-12-20 DIAGNOSIS — Z01818 Encounter for other preprocedural examination: Secondary | ICD-10-CM

## 2022-12-20 HISTORY — DX: Sleep apnea, unspecified: G47.30

## 2022-12-20 LAB — POCT PREGNANCY, URINE: Preg Test, Ur: NEGATIVE

## 2022-12-20 LAB — BASIC METABOLIC PANEL
Anion gap: 8 (ref 5–15)
BUN: 8 mg/dL (ref 6–20)
CO2: 24 mmol/L (ref 22–32)
Calcium: 8.8 mg/dL — ABNORMAL LOW (ref 8.9–10.3)
Chloride: 105 mmol/L (ref 98–111)
Creatinine, Ser: 0.98 mg/dL (ref 0.44–1.00)
GFR, Estimated: >60 mL/min (ref >60)
Glucose, Bld: 113 mg/dL — ABNORMAL HIGH (ref 70–99)
Potassium: 3.8 mmol/L (ref 3.5–5.1)
Sodium: 137 mmol/L (ref 135–145)

## 2022-12-22 ENCOUNTER — Ambulatory Visit (HOSPITAL_BASED_OUTPATIENT_CLINIC_OR_DEPARTMENT_OTHER): Payer: Medicare Other | Admitting: Anesthesiology

## 2022-12-22 ENCOUNTER — Encounter (HOSPITAL_COMMUNITY): Payer: Self-pay

## 2022-12-22 ENCOUNTER — Other Ambulatory Visit: Payer: Self-pay

## 2022-12-22 ENCOUNTER — Ambulatory Visit (HOSPITAL_COMMUNITY)
Admission: RE | Admit: 2022-12-22 | Discharge: 2022-12-22 | Disposition: A | Discharge status: Home / Self Care | Payer: Medicare Other | Attending: Internal Medicine | Admitting: Internal Medicine

## 2022-12-22 ENCOUNTER — Encounter (HOSPITAL_COMMUNITY)
Admission: RE | Disposition: A | Discharge status: Home / Self Care | Payer: Self-pay | Source: Home / Self Care | Attending: Internal Medicine

## 2022-12-22 ENCOUNTER — Ambulatory Visit (HOSPITAL_COMMUNITY): Payer: Medicare Other | Admitting: Anesthesiology

## 2022-12-22 DIAGNOSIS — F319 Bipolar disorder, unspecified: Secondary | ICD-10-CM | POA: Insufficient documentation

## 2022-12-22 DIAGNOSIS — I10 Essential (primary) hypertension: Secondary | ICD-10-CM

## 2022-12-22 DIAGNOSIS — I252 Old myocardial infarction: Secondary | ICD-10-CM | POA: Insufficient documentation

## 2022-12-22 DIAGNOSIS — K449 Diaphragmatic hernia without obstruction or gangrene: Secondary | ICD-10-CM | POA: Diagnosis not present

## 2022-12-22 DIAGNOSIS — K2970 Gastritis, unspecified, without bleeding: Secondary | ICD-10-CM | POA: Diagnosis not present

## 2022-12-22 DIAGNOSIS — Z79899 Other long term (current) drug therapy: Secondary | ICD-10-CM | POA: Insufficient documentation

## 2022-12-22 DIAGNOSIS — K222 Esophageal obstruction: Secondary | ICD-10-CM | POA: Insufficient documentation

## 2022-12-22 DIAGNOSIS — Z87891 Personal history of nicotine dependence: Secondary | ICD-10-CM

## 2022-12-22 DIAGNOSIS — R1013 Epigastric pain: Secondary | ICD-10-CM

## 2022-12-22 DIAGNOSIS — K219 Gastro-esophageal reflux disease without esophagitis: Secondary | ICD-10-CM | POA: Insufficient documentation

## 2022-12-22 DIAGNOSIS — E039 Hypothyroidism, unspecified: Secondary | ICD-10-CM | POA: Diagnosis not present

## 2022-12-22 DIAGNOSIS — R1310 Dysphagia, unspecified: Secondary | ICD-10-CM

## 2022-12-22 HISTORY — PX: ESOPHAGOGASTRODUODENOSCOPY (EGD) WITH PROPOFOL: SHX5813

## 2022-12-22 HISTORY — PX: BALLOON DILATION: SHX5330

## 2022-12-22 LAB — GLUCOSE, CAPILLARY: Glucose-Capillary: 83 mg/dL (ref 70–99)

## 2022-12-22 SURGERY — ESOPHAGOGASTRODUODENOSCOPY (EGD) WITH PROPOFOL
Anesthesia: General

## 2022-12-22 MED ORDER — PROPOFOL 10 MG/ML IV BOLUS
INTRAVENOUS | Status: DC | PRN
  Administered 2022-12-22 (×2): 50 mg via INTRAVENOUS
  Administered 2022-12-22: 100 mg via INTRAVENOUS

## 2022-12-22 MED ORDER — LIDOCAINE HCL (CARDIAC) PF 100 MG/5ML IV SOSY
PREFILLED_SYRINGE | INTRAVENOUS | Status: DC | PRN
  Administered 2022-12-22: 50 mg via INTRATRACHEAL

## 2022-12-22 MED ORDER — LACTATED RINGERS IV SOLN: INTRAVENOUS | Status: DC

## 2022-12-22 NOTE — Op Note (Signed)
[facility] Patient Name: [name] Procedure Date: 12/22/2022 12:43 PM MRN: [id number] Date of Birth: Jul 15, 1991 Attending MD: Hennie Duos. Marletta Lor , [place], [id number] CSN: [id number] Age: 32 Admit Type: Outpatient Procedure:                Upper GI endoscopy Indications:              Dysphagia, Heartburn Providers:                Hennie Duos. Marletta Lor, DO, Jannett Celestine, RN Referring MD:              Medicines:                See the Anesthesia note for documentation of the                            administered medications Complications:            No immediate complications. Estimated Blood Loss:     Estimated blood loss was minimal. Procedure:                Pre-Anesthesia Assessment:                           - The anesthesia plan was to use monitored                            anesthesia care (MAC).                           After obtaining informed consent, the endoscope was                            passed under direct vision. Throughout the                            procedure, the patient's blood pressure, pulse, and                            oxygen saturations were monitored continuously. The                            GIF-H190 ([id number]) scope was introduced through the                            mouth, and advanced to the second part of duodenum.                            The upper GI endoscopy was accomplished without                            difficulty. The patient tolerated the procedure                            well. Scope In: 12:57:27 PM Scope Out: 1:01:57 PM Total Procedure Duration: 0 hours 4 minutes 30 seconds  Findings:      A small hiatal hernia was present.  A mild Schatzki ring was found in the distal esophagus. A TTS dilator       was passed through the scope. Dilation with an 18-19-20 mm balloon       dilator was performed to 20 mm. The dilation site was examined and       showed moderate improvement in luminal narrowing.      Patchy mild  inflammation characterized by erythema was found in the       gastric body and in the gastric antrum. Biopsies were taken with a cold       forceps for Helicobacter pylori testing.      The duodenal bulb, first portion of the duodenum and second portion of       the duodenum were normal. Impression:               - Small hiatal hernia.                           - Mild Schatzki ring. Dilated.                           - Gastritis. Biopsied.                           - Normal duodenal bulb, first portion of the                            duodenum and second portion of the duodenum. Moderate Sedation:      Per Anesthesia Care Recommendation:           - Patient has a contact number available for                            emergencies. The signs and symptoms of potential                            delayed complications were discussed with the                            patient. Return to normal activities tomorrow.                            Written discharge instructions were provided to the                            patient.                           - Resume previous diet.                           - Continue present medications.                           - Await pathology results.                           - Repeat upper endoscopy PRN for retreatment.                           -  Return to GI clinic in 3 months.                           - Use Prilosec (omeprazole) 40 mg PO daily as                            patient states this helped more than pantoprazole Procedure Code(s):        --- Professional ---                           [phone], Esophagogastroduodenoscopy, flexible,                            transoral; with transendoscopic balloon dilation of                            esophagus (less than 30 mm diameter)                           43239, 59, Esophagogastroduodenoscopy, flexible,                            transoral; with biopsy, single or multiple Diagnosis Code(s):        ---  Professional ---                           K44.9, Diaphragmatic hernia without obstruction or                            gangrene                           K22.2, Esophageal obstruction                           K29.70, Gastritis, unspecified, without bleeding                           R13.10, Dysphagia, unspecified                           R12, Heartburn CPT copyright 2022 [employer]. All rights reserved. The codes documented in this report are preliminary and upon coder review may  be revised to meet current compliance requirements. Hennie Duos. Marletta Lor, DO Hennie Duos. Marletta Lor, DO 12/22/2022 1:36:45 PM This report has been signed electronically. Number of Addenda: 0

## 2022-12-22 NOTE — Anesthesia Preprocedure Evaluation (Signed)
Anesthesia Evaluation  Patient identified by MRN, date of birth, ID band Patient awake    Reviewed: Allergy & Precautions, H&P , NPO status , Patient's Chart, lab work & pertinent test results  Airway Mallampati: II  TM Distance: >3 FB Neck ROM: Full    Dental no notable dental hx. (+) Dental Advisory Given   Pulmonary sleep apnea , former smoker   Pulmonary exam normal breath sounds clear to auscultation       Cardiovascular Exercise Tolerance: Good hypertension, Pt. on medications + Past MI  Normal cardiovascular exam Rhythm:Regular Rate:Normal     Neuro/Psych  Headaches PSYCHIATRIC DISORDERS Anxiety Depression Bipolar Disorder    Neuromuscular disease    GI/Hepatic Neg liver ROS,GERD  Medicated,,  Endo/Other  Hypothyroidism  Morbid obesity  Renal/GU negative Renal ROS  negative genitourinary   Musculoskeletal  (+) Arthritis , Osteoarthritis,    Abdominal   Peds negative pediatric ROS (+)  Hematology negative hematology ROS (+)   Anesthesia Other Findings   Reproductive/Obstetrics negative OB ROS                             Anesthesia Physical Anesthesia Plan  ASA: 3  Anesthesia Plan: General   Post-op Pain Management: Minimal or no pain anticipated   Induction: Intravenous  PONV Risk Score and Plan: Treatment may vary due to age or medical condition and Propofol infusion  Airway Management Planned:   Additional Equipment:   Intra-op Plan:   Post-operative Plan:   Informed Consent: I have reviewed the patients History and Physical, chart, labs and discussed the procedure including the risks, benefits and alternatives for the proposed anesthesia with the patient or authorized representative who has indicated his/her understanding and acceptance.     Dental advisory given  Plan Discussed with: CRNA and Surgeon  Anesthesia Plan Comments:        Anesthesia Quick  Evaluation

## 2022-12-22 NOTE — Discharge Instructions (Addendum)
EGD Discharge instructions Please read the instructions outlined below and refer to this sheet in the next few weeks. These discharge instructions provide you with general information on caring for yourself after you leave the hospital. Your doctor may also give you specific instructions. While your treatment has been planned according to the most current medical practices available, unavoidable complications occasionally occur. If you have any problems or questions after discharge, please call your doctor. ACTIVITY You may resume your regular activity but move at a slower pace for the next 24 hours.  Take frequent rest periods for the next 24 hours.  Walking will help expel (get rid of) the air and reduce the bloated feeling in your abdomen.  No driving for 24 hours (because of the anesthesia (medicine) used during the test).  You may shower.  Do not sign any important legal documents or operate any machinery for 24 hours (because of the anesthesia used during the test).  NUTRITION Drink plenty of fluids.  You may resume your normal diet.  Begin with a light meal and progress to your normal diet.  Avoid alcoholic beverages for 24 hours or as instructed by your caregiver.  MEDICATIONS You may resume your normal medications unless your caregiver tells you otherwise.  WHAT YOU CAN EXPECT TODAY You may experience abdominal discomfort such as a feeling of fullness or "gas" pains.  FOLLOW-UP Your doctor will discuss the results of your test with you.  SEEK IMMEDIATE MEDICAL ATTENTION IF ANY OF THE FOLLOWING OCCUR: Excessive nausea (feeling sick to your stomach) and/or vomiting.  Severe abdominal pain and distention (swelling).  Trouble swallowing.  Temperature over 101 F (37.8 C).  Rectal bleeding or vomiting of blood.    Your EGD revealed mild amount inflammation in your stomach.  I took biopsies of this to rule out infection with a bacteria called H. pylori.  Await pathology results, my  office will contact you.  You have a small hiatal hernia and a mild tightening called a Schatzki's ring which I stretched out today.  Hopefully this helps with the feeling of food getting stuck.  Small bowel appeared normal.  Continue on pantoprazole daily.  Follow-up in GI office in 2 to 3 months.  I hope you have a great rest of your week!  Hennie Duos. Marletta Lor, D.O. Gastroenterology and Hepatology [facility]

## 2022-12-22 NOTE — Transfer of Care (Signed)
Immediate Anesthesia Transfer of Care Note  Patient: [name]  Procedure(s) Performed: ESOPHAGOGASTRODUODENOSCOPY (EGD) WITH PROPOFOL BALLOON DILATION  Patient Location: Short Stay  Anesthesia Type:General  Level of Consciousness: awake, alert , and oriented  Airway & Oxygen Therapy: Patient Spontanous Breathing  Post-op Assessment: Report given to RN and Post -op Vital signs reviewed and stable  Post vital signs: Reviewed and stable  Last Vitals:  Vitals Value Taken Time  BP 107/39 12/22/22 1308  Temp 36.7 C 12/22/22 1308  Pulse 89 12/22/22 1308  Resp 12 12/22/22 1308  SpO2 97 % 12/22/22 1308    Last Pain:  Vitals:   12/22/22 1308  TempSrc: Oral  PainSc: 0-No pain      Patients Stated Pain Goal: 6 (12/22/22 1308)  Complications: No notable events documented.

## 2022-12-22 NOTE — Interval H&P Note (Signed)
History and Physical Interval Note:  12/22/2022 12:50 PM  [name]  has presented today for surgery, with the diagnosis of dysphagia,GERD,epigastric pain.  The various methods of treatment have been discussed with the patient and family. After consideration of risks, benefits and other options for treatment, the patient has consented to  Procedure(s) with comments: ESOPHAGOGASTRODUODENOSCOPY (EGD) WITH PROPOFOL (N/A) - 1:30 pm, asa 3 BALLOON DILATION (N/A) as a surgical intervention.  The patient's history has been reviewed, patient examined, no change in status, stable for surgery.  I have reviewed the patient's chart and labs.  Questions were answered to the patient's satisfaction.     Lanelle Bal

## 2022-12-22 NOTE — Anesthesia Postprocedure Evaluation (Signed)
Anesthesia Post Note  Patient: [name]  Procedure(s) Performed: ESOPHAGOGASTRODUODENOSCOPY (EGD) WITH PROPOFOL BALLOON DILATION  Patient location during evaluation: Phase II Anesthesia Type: General Level of consciousness: awake and alert and oriented Pain management: pain level controlled Vital Signs Assessment: post-procedure vital signs reviewed and stable Respiratory status: spontaneous breathing, nonlabored ventilation and respiratory function stable Cardiovascular status: blood pressure returned to baseline and stable Postop Assessment: no apparent nausea or vomiting Anesthetic complications: no  No notable events documented.   Last Vitals:  Vitals:   12/22/22 1211 12/22/22 1308  BP: (!) 148/86 (!) 107/39  Pulse: (!) 4 89  Resp: 20 12  Temp: 36.8 C 36.7 C  SpO2: 97% 97%    Last Pain:  Vitals:   12/22/22 1308  TempSrc: Oral  PainSc: 0-No pain                 [name]

## 2022-12-26 ENCOUNTER — Other Ambulatory Visit: Payer: Self-pay | Admitting: Internal Medicine

## 2022-12-26 ENCOUNTER — Ambulatory Visit: Payer: Medicaid Other | Admitting: Family Medicine

## 2022-12-26 LAB — SURGICAL PATHOLOGY

## 2022-12-26 MED ORDER — OMEPRAZOLE 40 MG PO CPDR: 40.0000 mg | DELAYED_RELEASE_CAPSULE | Freq: Every day | ORAL | 11 refills | Status: DC

## 2022-12-29 ENCOUNTER — Encounter (HOSPITAL_COMMUNITY): Payer: Self-pay | Admitting: Internal Medicine

## 2023-01-01 ENCOUNTER — Ambulatory Visit: Payer: Medicaid Other | Admitting: Family Medicine

## 2023-01-04 ENCOUNTER — Ambulatory Visit: Payer: Medicaid Other | Admitting: Family Medicine

## 2023-01-11 ENCOUNTER — Encounter: Payer: Self-pay | Admitting: Gastroenterology

## 2023-01-22 ENCOUNTER — Ambulatory Visit: Payer: Self-pay | Admitting: Family Medicine

## 2023-01-23 IMAGING — CT CT ABD-PELV W/ CM
2 of 5 series · 16 of 46 positions shown, 18 images · IV contrast (agent unspecified)
Comparison: Ultrasound 11/19/2020

CLINICAL DATA: Right upper quadrant abdominal pain. History of
cholecystectomy on 08/29/2021

EXAM:
CT ABDOMEN AND PELVIS WITH CONTRAST
TECHNIQUE: Multidetector CT imaging of the abdomen and pelvis was performed
using the standard protocol following bolus administration of
intravenous contrast.

[Series 2: axial st · axial · 0.98mm/px · z∈[+1358,+1852]mm · 13 of 113 slices shown, 15 images]
[im 7/113  soft-tissue]
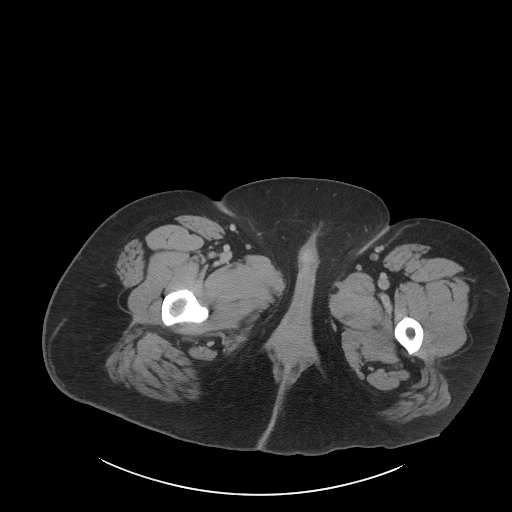
[im 7/113  bone]
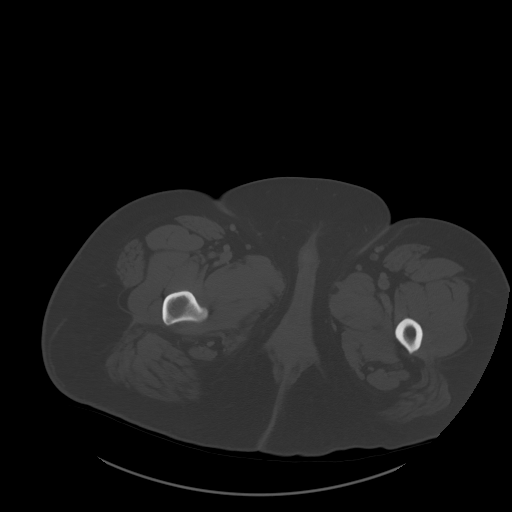
[im 14/113  soft-tissue]
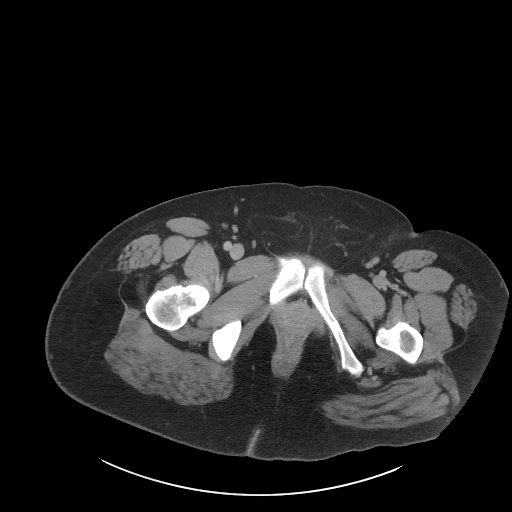
[im 27/113  soft-tissue]
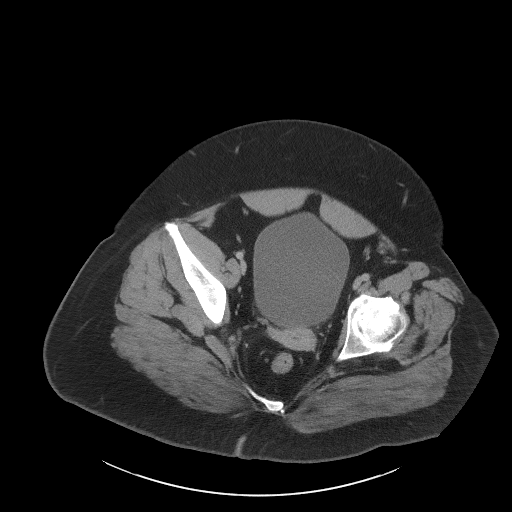
[im 33/113  soft-tissue]
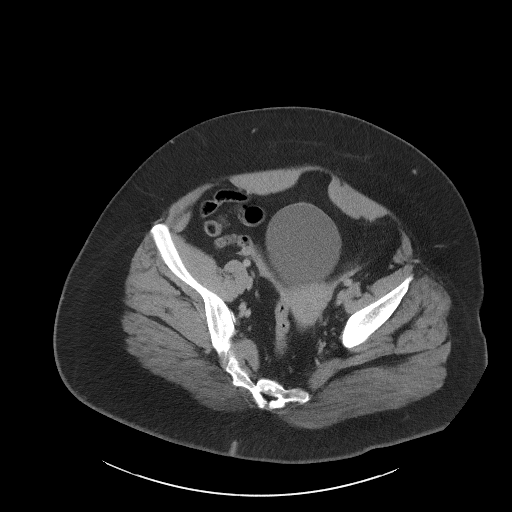
[im 40/113  soft-tissue]
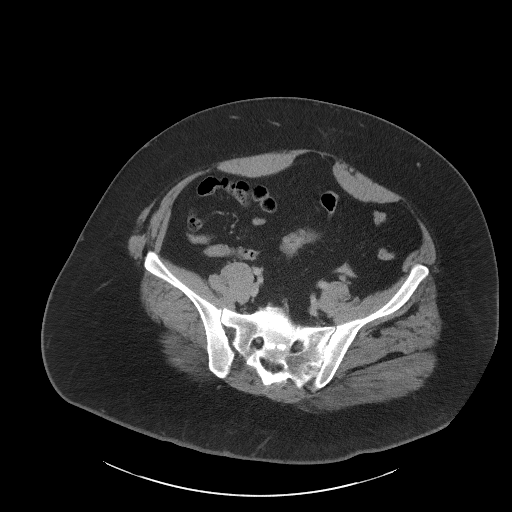
[im 47/113  soft-tissue]
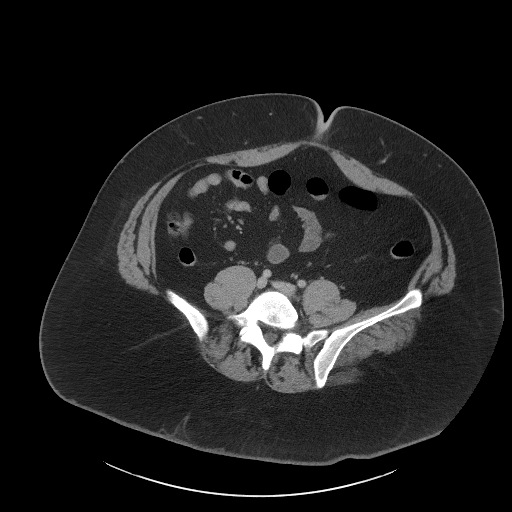
[im 60/113  soft-tissue]
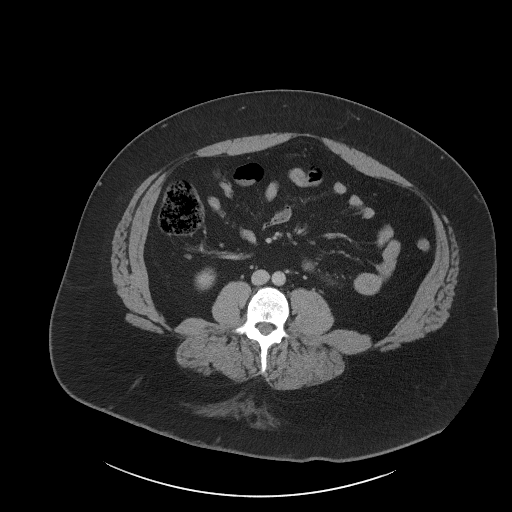
[im 66/113  soft-tissue]
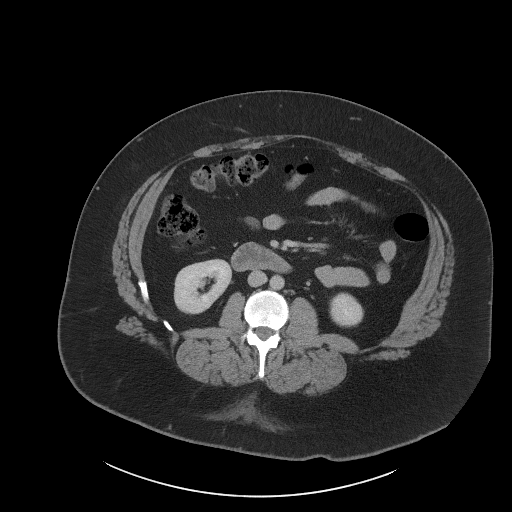
[im 73/113  soft-tissue]
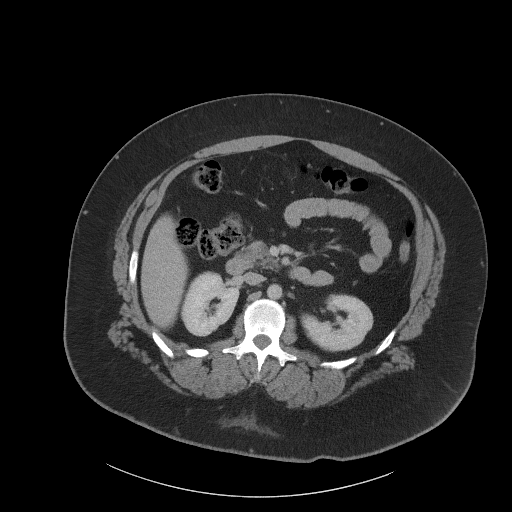
[im 73/113  bone]
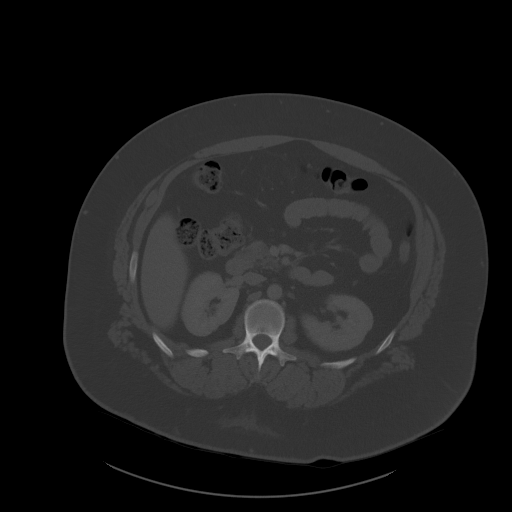
[im 80/113  soft-tissue]
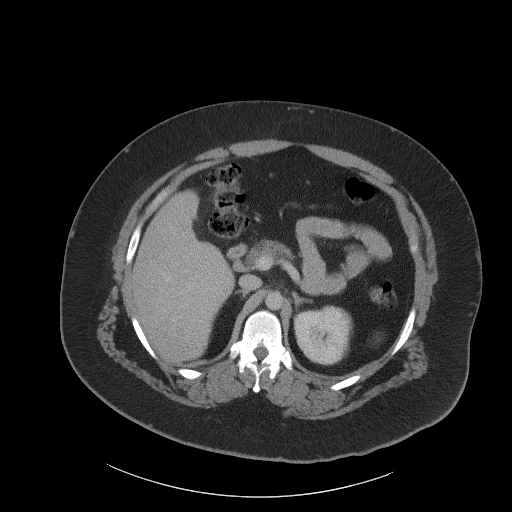
[im 86/113  soft-tissue]
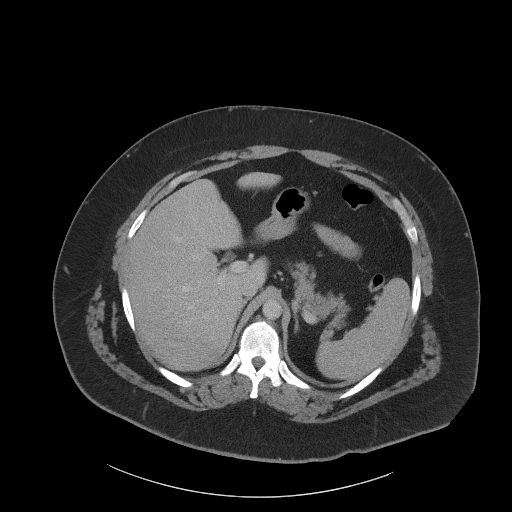
[im 99/113  soft-tissue]
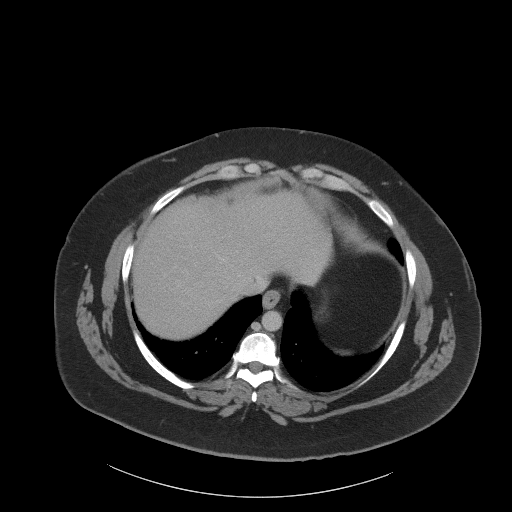
[im 106/113  soft-tissue]
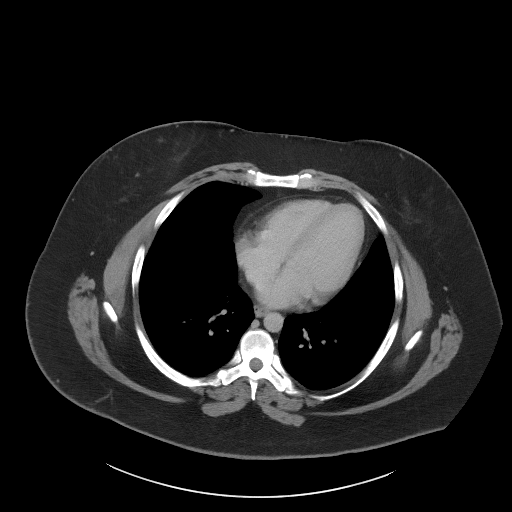

[Series 5: coronal st · coronal · 0.98mm/px · 3 of 136 slices shown]
[im 46/136  soft-tissue]
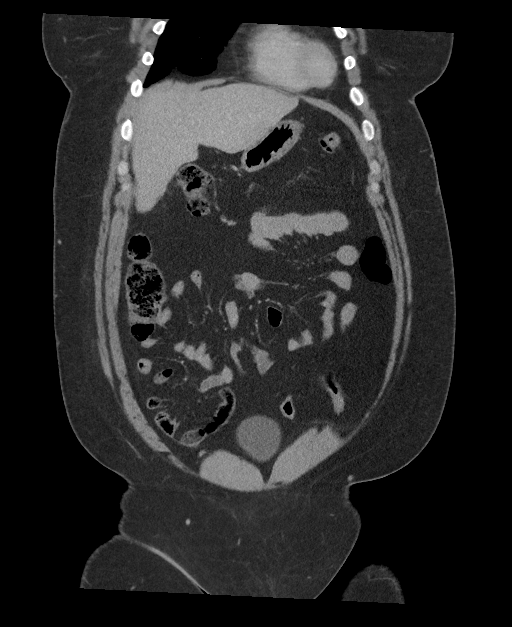
[im 61/136  soft-tissue]
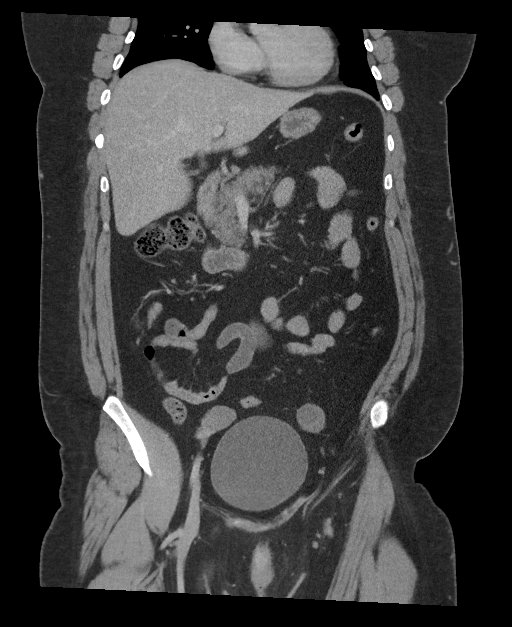
[im 76/136  soft-tissue]
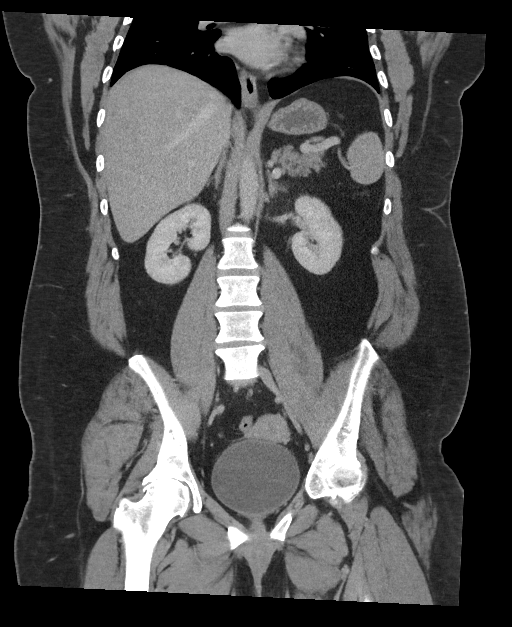

[16 of 46 positions shown; findings below may reference images not displayed]

RADIATION DOSE REDUCTION: This exam was performed according to the
departmental dose-optimization program which includes automated
exposure control, adjustment of the mA and/or kV according to
patient size and/or use of iterative reconstruction technique.

CONTRAST:  100mL OMNIPAQUE IOHEXOL 300 MG/ML  SOLN
FINDINGS: Lower chest: Included lung bases are clear.  Heart size is normal.

Hepatobiliary: Interval cholecystectomy. No fluid collection or
other abnormality within the gallbladder fossa. Borderline
prominence of the common bile duct is presumed postoperative. No
intrahepatic biliary dilatation. Normal appearance of the liver. No
focal liver abnormality.

Pancreas: Unremarkable. No pancreatic ductal dilatation or
surrounding inflammatory changes.

Spleen: Normal in size without focal abnormality.

Adrenals/Urinary Tract: Unremarkable adrenal glands. Kidneys enhance
symmetrically without focal lesion, stone, or hydronephrosis.
Ureters are nondilated. Urinary bladder appears unremarkable.

Stomach/Bowel: Stomach is within normal limits. Appendix appears
normal. No evidence of bowel wall thickening, distention, or
inflammatory changes.

Vascular/Lymphatic: No significant vascular findings are present. No
enlarged abdominal or pelvic lymph nodes.

Reproductive: Uterus and bilateral adnexa are unremarkable.

Other: No free fluid. No abdominopelvic fluid collection. No
pneumoperitoneum. No abdominal wall hernia.

Musculoskeletal: Severe, age advanced degenerative changes of the
bilateral hips, left worse than right. No acute osseous abnormality.
IMPRESSION: 1. No acute abdominopelvic findings.
2. Interval cholecystectomy without evidence of complication.
3. Severe, age-advanced degenerative changes of the bilateral hips,
left worse than right.

## 2023-02-05 ENCOUNTER — Ambulatory Visit: Payer: Medicaid Other | Admitting: Family Medicine

## 2023-02-13 ENCOUNTER — Ambulatory Visit: Payer: Medicaid Other | Admitting: Nutrition

## 2023-02-23 ENCOUNTER — Ambulatory Visit: Payer: Medicare Other | Admitting: Orthopedic Surgery

## 2023-03-01 ENCOUNTER — Ambulatory Visit (INDEPENDENT_AMBULATORY_CARE_PROVIDER_SITE_OTHER): Payer: Medicare Other | Admitting: Family Medicine

## 2023-03-01 ENCOUNTER — Encounter: Payer: Self-pay | Admitting: Family Medicine

## 2023-03-01 VITALS — BP 120/75 | HR 121 | Ht 63.0 in | Wt 283.1 lb

## 2023-03-01 DIAGNOSIS — E8881 Metabolic syndrome: Secondary | ICD-10-CM

## 2023-03-01 DIAGNOSIS — M25551 Pain in right hip: Secondary | ICD-10-CM | POA: Diagnosis not present

## 2023-03-01 DIAGNOSIS — R7301 Impaired fasting glucose: Secondary | ICD-10-CM | POA: Diagnosis not present

## 2023-03-01 DIAGNOSIS — E038 Other specified hypothyroidism: Secondary | ICD-10-CM | POA: Diagnosis not present

## 2023-03-01 DIAGNOSIS — E7849 Other hyperlipidemia: Secondary | ICD-10-CM

## 2023-03-01 DIAGNOSIS — M25552 Pain in left hip: Secondary | ICD-10-CM

## 2023-03-01 DIAGNOSIS — G8929 Other chronic pain: Secondary | ICD-10-CM

## 2023-03-01 MED ORDER — ROSUVASTATIN CALCIUM 5 MG PO TABS
5.0000 mg | ORAL_TABLET | Freq: Every day | ORAL | 1 refills | Status: DC
Start: 2023-03-01 — End: 2023-04-25

## 2023-03-01 MED ORDER — LEVOTHYROXINE SODIUM 25 MCG PO TABS: 25.0000 ug | ORAL_TABLET | Freq: Every day | ORAL | 1 refills | Status: DC

## 2023-03-01 MED ORDER — METFORMIN HCL 500 MG PO TABS: 500.0000 mg | ORAL_TABLET | Freq: Two times a day (BID) | ORAL | 3 refills | Status: DC

## 2023-03-01 NOTE — Patient Instructions (Addendum)
I appreciate the opportunity to provide care to you today!    Follow up:  1 month for pap  Hip pain Referral place to Physical therapy Continue taking ibuprofen/Tylenol as needed for  pain relief I recommend heat application to relax tense muscle and improve blood flow Avoid aggravating activities that exacerbate hip pain such as prolonged standing and heavy lifting I recommend weight loss to reduce the load on the hip joint and decrease pain  Please schedule Medicare Annual Wellness visit.    Referrals today- Physical Therapy  Attached with your AVS, you will find valuable resources for self-education. I highly recommend dedicating some time to thoroughly examine them.   Please continue to a heart-healthy diet and increase your physical activities. Try to exercise for at least five days a week.    It was a pleasure to see you and I look forward to continuing to work together on your health and well-being. Please do not hesitate to call the office if you need care or have questions about your care.  In case of emergency, please visit the Emergency Department for urgent care, or contact our clinic at [phone] to schedule an appointment. We're here to help you!   Have a wonderful day and week. With Gratitude, Gilmore Laroche MSN, FNP-BC

## 2023-03-01 NOTE — Progress Notes (Signed)
Established Patient Office Visit  Subjective:  Patient ID: [name], adult    DOB: 1990-09-09  Age: 32 y.o. MRN: [id number]  CC:  Chief Complaint  Patient presents with   Obesity    Weight loss f/u    Hip Pain    Pt reports hip pain will not be doing pap today due to this. Needs to discuss options to help with hip pain, this pain has been ongoing for 10 years.    HPI Prima Rayner Knobloch is a 32 y.o. adult with past medical history of obesity presents for with loss follow-up. For the details of today's visit, please refer to the assessment and plan.     Past Medical History:  Diagnosis Date   Anxiety attack    panic attacks   Back pain    Bipolar disorder (HCC)    Chest pain    Constipation    Depression    Drug use    Food allergy    Gallbladder problem    GERD (gastroesophageal reflux disease)    Headache    High blood pressure    History of heart attack    History of stomach ulcers    based on symptoms, around age 65   HLD (hyperlipidemia)    Hypothyroidism    Joint pain    Myocardial infarction [facility]) 2013   age 75- patient reports it was stopped with nitroglycerine   Osteoarthritis    Pericarditis    RA (rheumatoid arthritis) (HCC)    Sleep apnea    SOB (shortness of breath)    Still disease, 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HCC)    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HCC)    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HCC)    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HCC)    Swallowing difficulty    Vitamin D deficiency     Past Surgical History:  Procedure Laterality Date   BALLOON DILATION N/A 12/22/2022   Procedure: BALLOON DILATION;  Surgeon: Lanelle Bal, DO;  [facility];  Service: Endoscopy;  Laterality: N/A;   CHOLECYSTECTOMY N/A 08/29/2021   Procedure: LAPAROSCOPIC CHOLECYSTECTOMY WITH ICG;  Surgeon: Andria Meuse, MD;  Location: [facility];  Service: General;  Laterality: N/A;   ESOPHAGOGASTRODUODENOSCOPY (EGD) WITH PROPOFOL N/A 12/22/2022   Procedure: ESOPHAGOGASTRODUODENOSCOPY (EGD) WITH PROPOFOL;   Surgeon: Lanelle Bal, DO;  [facility]: [facility];  Service: Endoscopy;  Laterality: N/A;  1:30 pm, asa 3   WISDOM TOOTH EXTRACTION      Family History  Problem Relation Age of Onset   Heart disease Mother    Heart attack Mother    High blood pressure Mother    Sudden death Mother    Depression Father    Rheum arthritis Father    Hypertension Father    Bipolar disorder Brother    Colon cancer Neg Hx     Social History   Socioeconomic History   Marital status: Significant Other    Spouse name: Not on file   Number of children: Not on file   Years of education: Not on file   Highest education level: GED or equivalent  Occupational History   Not on file  Tobacco Use   Smoking status: Former    Current packs/day: 0.00    Average packs/day: 1 pack/day for 10.4 years (10.4 ttl pk-yrs)    Types: Cigarettes, E-cigarettes    Start date: 07/27/2009    Quit date: 12/2019    Years since quitting: 3.1    Passive exposure: Current  Smokeless tobacco: Never  Vaping Use   Vaping status: Every Day   Substances: Nicotine, Flavoring  Substance and Sexual Activity   Alcohol use: Not Currently   Drug use: Yes    Types: Marijuana    Comment: daily   Sexual activity: Yes    Partners: Female    Birth control/protection: None  Other Topics Concern   Not on file  Social History Narrative   Not on file   Social Determinants of Health   Financial Resource Strain: Low Risk  (02/28/2023)   Overall Financial Resource Strain (CARDIA)    Difficulty of Paying Living Expenses: Not very hard  Food Insecurity: No Food Insecurity (02/28/2023)   Hunger Vital Sign    Worried About Running Out of Food in the Last Year: Never true    Ran Out of Food in the Last Year: Never true  Recent Concern: Food Insecurity - Food Insecurity Present (12/08/2022)   Received from [facility], [facility]   Hunger Vital Sign    Worried About Running Out of Food in the  Last Year: Sometimes true    Ran Out of Food in the Last Year: Never true  Transportation Needs: No Transportation Needs (02/28/2023)   PRAPARE - Administrator, Civil Service (Medical): No    Lack of Transportation (Non-Medical): No  Physical Activity: Insufficiently Active (02/28/2023)   Exercise Vital Sign    Days of Exercise per Week: 2 days    Minutes of Exercise per Session: 10 min  Stress: Stress Concern Present (02/28/2023)   [employer] of Occupational Health - Occupational Stress Questionnaire    Feeling of Stress : Rather much  Social Connections: Unknown (02/28/2023)   Social Connection and Isolation Panel [NHANES]    Frequency of Communication with Friends and Family: More than three times a week    Frequency of Social Gatherings with Friends and Family: More than three times a week    Attends Religious Services: Patient declined    Database administrator or Organizations: No    Attends Engineer, structural: Not on file    Marital Status: Never married  Intimate Partner Violence: Not At Risk (04/24/2022)   Received from [facility], [facility]   Humiliation, Afraid, Rape, and Kick questionnaire    Fear of Current or Ex-Partner: No    Emotionally Abused: No    Physically Abused: No    Sexually Abused: No    Outpatient Medications Prior to Visit  Medication Sig Dispense Refill   amitriptyline (ELAVIL) 50 MG tablet Take 50 mg by mouth at bedtime.     ARIPiprazole (ABILIFY) 10 MG tablet Take 1 tablet (10 mg total) by mouth daily. (Patient taking differently: Take 15 mg by mouth daily.) 30 tablet 0   B-D TB SYRINGE .5CC/27GX1/2" 27G X 1/2" 0.5 ML MISC USE 1 EACH ONCE A WEEK FOR 84 DAYS     BD SAFETYGLIDE NEEDLE 18G X 1-1/2" MISC USE 1 EACH ONCE A WEEK FOR 84 DAYS     clonazePAM (KLONOPIN) 0.5 MG tablet Take 0.5 mg by mouth daily as needed.     gabapentin (NEURONTIN) 300 MG capsule Take 300 mg by mouth 3 (three) times daily.     ibuprofen  (ADVIL) 200 MG tablet Take 800 mg by mouth every 6 (six) hours as needed (For leg pain).     omeprazole (PRILOSEC) 40 MG capsule Take 1 capsule (40 mg total) by mouth  daily. 30 capsule 11   ondansetron (ZOFRAN) 4 MG tablet Take 1 tablet (4 mg total) by mouth every 8 (eight) hours as needed for nausea or vomiting. 20 tablet 0   ondansetron (ZOFRAN-ODT) 4 MG disintegrating tablet Take 4 mg by mouth 2 (two) times daily.     promethazine (PHENERGAN) 25 MG tablet Take 25 mg by mouth every 6 (six) hours as needed.     testosterone cypionate (DEPOTESTOSTERONE CYPIONATE) 200 MG/ML injection Inject into the skin. 0.3mg  once a week     topiramate (TOPAMAX) 25 MG tablet Take 25 mg by mouth 2 (two) times daily.     TUBERCULIN SYR 1CC/27GX1/2" 27G X 1/2" 1 ML MISC Use 1 each once a week for 84 days     Vitamin D, Ergocalciferol, (DRISDOL) 1.25 MG (50000 UNIT) CAPS capsule Take 1 capsule (50,000 Units total) by mouth every 7 (seven) days. 5 capsule 0   levothyroxine (SYNTHROID) 25 MCG tablet TAKE 1 TABLET BY MOUTH EVERY DAY 90 tablet 0   metFORMIN (GLUCOPHAGE) 500 MG tablet Take 1 tablet (500 mg total) by mouth 2 (two) times daily with a meal. 180 tablet 3   rosuvastatin (CRESTOR) 5 MG tablet Take 1 tablet (5 mg total) by mouth daily. 30 tablet 0   No facility-administered medications prior to visit.    Allergies  Allergen Reactions   Strawberry Extract Itching and Rash    ROS Review of Systems  Constitutional:  Negative for fatigue and fever.  Eyes:  Negative for visual disturbance.  Respiratory:  Negative for chest tightness and shortness of breath.   Cardiovascular:  Negative for chest pain and palpitations.  Musculoskeletal:        Bilateral hip pain  Neurological:  Negative for dizziness and headaches.      Objective:    Physical Exam Constitutional:      Appearance: He is obese.  HENT:     Head: Normocephalic.     Mouth/Throat:     Mouth: Mucous membranes are moist.  Cardiovascular:      Rate and Rhythm: Normal rate.     Heart sounds: Normal heart sounds.  Pulmonary:     Effort: Pulmonary effort is normal.     Breath sounds: Normal breath sounds.  Neurological:     Mental Status: He is alert.     BP 120/75   Pulse (!) 121   Ht 5\' 3"  (1.6 m)   Wt 283 lb 1.9 oz (128.4 kg)   SpO2 95%   BMI 50.15 kg/m  Wt Readings from Last 3 Encounters:  03/01/23 283 lb 1.9 oz (128.4 kg)  12/22/22 280 lb (127 kg)  12/20/22 284 lb 1.9 oz (128.9 kg)    Lab Results  Component Value Date   TSH 6.080 (H) 02/28/2023   Lab Results  Component Value Date   WBC 11.0 (H) 02/28/2023   HGB 15.3 02/28/2023   HCT 48.3 (H) 02/28/2023   MCV 84 02/28/2023   PLT 348 02/28/2023   Lab Results  Component Value Date   NA 137 02/28/2023   K 4.4 02/28/2023   CO2 21 02/28/2023   GLUCOSE 85 02/28/2023   BUN 8 02/28/2023   CREATININE 1.19 (H) 02/28/2023   BILITOT 0.8 02/28/2023   ALKPHOS 83 02/28/2023   AST 27 02/28/2023   ALT 46 (H) 02/28/2023   PROT 7.9 02/28/2023   ALBUMIN 4.7 02/28/2023   CALCIUM 10.5 (H) 02/28/2023   ANIONGAP 8 12/20/2022   EGFR 62 02/28/2023  Lab Results  Component Value Date   CHOL 192 02/28/2023   Lab Results  Component Value Date   HDL 38 (L) 02/28/2023   Lab Results  Component Value Date   LDLCALC 125 (H) 02/28/2023   Lab Results  Component Value Date   TRIG 163 (H) 02/28/2023   Lab Results  Component Value Date   CHOLHDL 5.1 (H) 02/28/2023   Lab Results  Component Value Date   HGBA1C 5.6 02/28/2023      Assessment & Plan:  Chronic hip pain, bilateral Assessment & Plan: No recent injury or trauma Chronic condition for 5 years Has osteoarthritis in the hips bilaterally Has received steroid injections in her hips previously with moderate relief Complains of bilateral hip pain today Pain is described as throbbing and achy Pain is rated 4-5 out of 10 Taking over-the-counter Tylenol and ibuprofen with relief of symptoms We will  place a referral into the physical therapy and consider orthopedic after completion of physical therapy Encouraged supportive care with heat application and avoidance of aggravating activities and wearing Encouraged to maintain a healthy weight to reduce the load on the hip joint and decrease pain  Orders: -     Ambulatory referral to Physical Therapy  Morbid obesity (HCC) Assessment & Plan: BMI 50.15kg/m The patient admits to not being physically active due to hip pain Encouraged  lifestyle modification with a heart healthy diet and increase physical activity Encouraged to increase her intake of foods rich in fruits, vegetables, whole grains Lean proteins: chicken, fish, beans, legumes Low Fat dairy products Reduced intake of saturated fats, trans fatty acids, cholesterol Aim to be active at least 5 days a week for 30 minutes each day ( walking briskly)  Wt Readings from Last 3 Encounters:  03/01/23 283 lb 1.9 oz (128.4 kg)  12/22/22 280 lb (127 kg)  12/20/22 284 lb 1.9 oz (128.9 kg)     Orders: -     metFORMIN HCl; Take 1 tablet (500 mg total) by mouth 2 (two) times daily with a meal.  Dispense: 180 tablet; Refill: 3  Other specified hypothyroidism -     Levothyroxine Sodium; Take 1 tablet (25 mcg total) by mouth daily.  Dispense: 90 tablet; Refill: 1  IFG (impaired fasting glucose) -     metFORMIN HCl; Take 1 tablet (500 mg total) by mouth 2 (two) times daily with a meal.  Dispense: 180 tablet; Refill: 3  Other hyperlipidemia -     Rosuvastatin Calcium; Take 1 tablet (5 mg total) by mouth daily.  Dispense: 90 tablet; Refill: 1  Note: This chart has been completed using Engineer, civil (consulting) software, and while attempts have been made to ensure accuracy, certain words and phrases may not be transcribed as intended.    Follow-up: Return in about 1 month (around 04/01/2023).   Gilmore Laroche, FNP

## 2023-03-02 DIAGNOSIS — G8929 Other chronic pain: Secondary | ICD-10-CM | POA: Insufficient documentation

## 2023-03-02 NOTE — Assessment & Plan Note (Signed)
No recent injury or trauma Chronic condition for 5 years Has osteoarthritis in the hips bilaterally Has received steroid injections in her hips previously with moderate relief Complains of bilateral hip pain today Pain is described as throbbing and achy Pain is rated 4-5 out of 10 Taking over-the-counter Tylenol and ibuprofen with relief of symptoms We will place a referral into the physical therapy and consider orthopedic after completion of physical therapy Encouraged supportive care with heat application and avoidance of aggravating activities and wearing Encouraged to maintain a healthy weight to reduce the load on the hip joint and decrease pain

## 2023-03-02 NOTE — Assessment & Plan Note (Deleted)
The patient admits to not being physically active due to hip pain Encouraged  lifestyle modification with a heart healthy diet and increase physical activity Encouraged to increase her intake of foods rich in fruits, vegetables, whole grains Lean proteins: chicken, fish, beans, legumes Low Fat dairy products Reduced intake of saturated fats, trans fatty acids, cholesterol Aim to be active at least 5 days a week for 30 minutes each day ( walking briskly)

## 2023-03-02 NOTE — Assessment & Plan Note (Addendum)
BMI 50.15kg/m The patient admits to not being physically active due to hip pain Encouraged  lifestyle modification with a heart healthy diet and increase physical activity Encouraged to increase her intake of foods rich in fruits, vegetables, whole grains Lean proteins: chicken, fish, beans, legumes Low Fat dairy products Reduced intake of saturated fats, trans fatty acids, cholesterol Aim to be active at least 5 days a week for 30 minutes each day ( walking briskly)  Wt Readings from Last 3 Encounters:  03/01/23 283 lb 1.9 oz (128.4 kg)  12/22/22 280 lb (127 kg)  12/20/22 284 lb 1.9 oz (128.9 kg)

## 2023-03-04 ENCOUNTER — Other Ambulatory Visit: Payer: Self-pay | Admitting: Family Medicine

## 2023-03-04 DIAGNOSIS — E038 Other specified hypothyroidism: Secondary | ICD-10-CM

## 2023-03-04 NOTE — Progress Notes (Deleted)
Office Visit Note  Patient: [name]             Date of Birth: 01-29-1991           MRN: [id number]             PCP: Gilmore Laroche, FNP Referring: Royann Shivers, * Visit Date: 03/05/2023   Subjective:  No chief complaint on file.   History of Present Illness: [name] is a 32 y.o. adult here for follow up ***   Previous HPI 08/29/22 [name] is a 32 y.o. adult here for arthritis with history of AOSD and unspecified pattern of JIA. They were on initial treatment with prednisone and methotrexate starting from the age of 2 for onset of inflammatory arthritis continued until age 31 before tapering off due to disease improvement. Onset of AOSD in 2014 with multiple episodes until most recent significant flare that was in 2017. This is characterized with systemic symptoms including fevers up to 104 Fahrenheit, joint pain, diffuse erythematous body rashes, and oral pustular lesions with diffuse pain all over.  The symptoms have responded to oral prednisone but has been tried on a number of steroid sparing medications. Initial biologic therapy with anakinra but was transition to Humira for long-term maintenance treatment.  Stopped Humira in 2021 due to no recent flares it was not seeing any significant improvement in the day-to-day joint pain symptoms with treatment.  Other medications tried include methotrexate and leflunomide this was complicated by GI intolerance and also worsening of chronic liver transaminase elevations.   He reports still experiencing low-grade fevers never above 101 Fahrenheit frequently occurring around nighttime especially after a day with a higher amount of stress and physical activity.  Bilateral hip pain is the most limiting symptom. Some right knee pain with walking and has noticed some abnormal rotation or posture on this side. Orthopedic evaluation has also noticed some spine curvature and disc slipping contributing to back pain. Takes  ibuprofen very frequently for this reports about 800 mg 3-4 times daily.  Does not see any improvement with Tylenol.  Does not take any other chronic medication for joint pain.  He is seeing a weight management clinic for trying to lose weight to be a candidate for total hip arthroplasty.  This has been challenging so far though due to limited exercise capacity from joint pain.   No Rheumatology ROS completed.   PMFS History:  Patient Active Problem List   Diagnosis Date Noted   Chronic hip pain, bilateral 03/02/2023   BMI 50.0-59.9, adult (HCC) 09/19/2022   OSA on CPAP 08/30/2022   Other specified hypothyroidism 08/22/2022   Elevated cholesterol 08/22/2022   Insulin resistance 07/18/2022   History of tachycardia 06/06/2022   Elevated ALT measurement 06/06/2022   SOB (shortness of breath) on exertion 05/23/2022   Health care maintenance 05/23/2022   Metabolic syndrome 05/23/2022   Vitamin B12 deficiency 05/23/2022   Acquired hypothyroidism 05/09/2022   Mixed hyperlipidemia 05/09/2022   Low HDL (under 40) 05/09/2022   Elevated glucose 05/09/2022   Eating disorder 05/09/2022   Other fatigue 05/09/2022   Class 3 severe obesity with serious comorbidity and body mass index (BMI) of 45.0 to 49.9 in adult (HCC) 05/09/2022   Lumbar degenerative disc disease 03/13/2022   Bipolar depression (HCC) 03/06/2022   Chronic bilateral low back pain with right-sided sciatica 02/15/2022   Vitamin D deficiency 01/04/2022   Loud snoring 01/04/2022   GAD (generalized anxiety disorder) 01/04/2022  Marijuana use 01/04/2022   Chest pain 12/25/2021   Transaminitis 12/25/2021   GERD without esophagitis 12/25/2021   Elevated lipase 12/25/2021   Compulsive skin picking 07/28/2020   Social phobia 07/28/2020   Bipolar I disorder, current or most recent episode depressed, in partial remission (HCC) 07/19/2020   Bilateral primary osteoarthritis of hip 07/19/2020   Allergic rhinitis due to pollen 05/12/2020    MDD (major depressive disorder), recurrent episode, severe (HCC) 05/02/2019   High risk medication use 05/03/2017   Tobacco use 05/29/2016   Arthralgia of multiple sites, bilateral 03/06/2016   Morbid obesity (HCC) 11/05/2014   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   Insomnia 02/18/2014   Bipolar affective disorder, currently depressed, moderate (HCC) 05/25/2011   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    Past Medical History:  Diagnosis Date   Anxiety attack    panic attacks   Back pain    Bipolar disorder (HCC)    Chest pain    Constipation    Depression    Drug use    Food allergy    Gallbladder problem    GERD (gastroesophageal reflux disease)    Headache    High blood pressure    History of heart attack    History of stomach ulcers    based on symptoms, around age 77   HLD (hyperlipidemia)    Hypothyroidism    Joint pain    Myocardial infarction [facility]) 2013   age 89- patient reports it was stopped with nitroglycerine   Osteoarthritis    Pericarditis    RA (rheumatoid arthritis) (HCC)    Sleep apnea    SOB (shortness of breath)    Still disease, 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HCC)    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HCC)    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HCC)    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HCC)    Swallowing difficulty    Vitamin D deficiency     Family History  Problem Relation Age of Onset   Heart disease Mother    Heart attack Mother    High blood pressure Mother    Sudden death Mother    Depression Father    Rheum arthritis Father    Hypertension Father    Bipolar disorder Brother    Colon cancer Neg Hx    Past Surgical History:  Procedure Laterality Date   BALLOON DILATION N/A 12/22/2022   Procedure: BALLOON DILATION;  Surgeon: Lanelle Bal, DO;  [facility];  Service: Endoscopy;  Laterality: N/A;   CHOLECYSTECTOMY N/A 08/29/2021   Procedure: LAPAROSCOPIC CHOLECYSTECTOMY WITH ICG;  Surgeon: Andria Meuse, MD;  Location: [facility];  Service: General;  Laterality: N/A;    ESOPHAGOGASTRODUODENOSCOPY (EGD) WITH PROPOFOL N/A 12/22/2022   Procedure: ESOPHAGOGASTRODUODENOSCOPY (EGD) WITH PROPOFOL;  Surgeon: Lanelle Bal, DO;  [facility]: [facility];  Service: Endoscopy;  Laterality: N/A;  1:30 pm, asa 3   WISDOM TOOTH EXTRACTION     Social History   Social History Narrative   Not on file   Immunization History  Administered Date(s) Administered   Influenza,inj,Quad PF,6+ Mos 05/03/2017, 04/25/2022   Pneumococcal Conjugate-13 10/11/2017   Tdap 12/07/2022     Objective: Vital Signs: There were no vitals taken for this visit.   Physical Exam   Musculoskeletal Exam: ***  CDAI Exam: CDAI Score: -- Patient Global: --; Provider Global: -- Swollen: --; Tender: -- Joint Exam 03/05/2023   No joint exam has been documented for this visit   There is currently no information documented on the homunculus.  Go to the Rheumatology activity and complete the homunculus joint exam.  Investigation: No additional findings.  Imaging: No results found.  Recent Labs: Lab Results  Component Value Date   WBC 11.0 (H) 02/28/2023   HGB 15.3 02/28/2023   PLT 348 02/28/2023   NA 137 02/28/2023   K 4.4 02/28/2023   CL 99 02/28/2023   CO2 21 02/28/2023   GLUCOSE 85 02/28/2023   BUN 8 02/28/2023   CREATININE 1.19 (H) 02/28/2023   BILITOT 0.8 02/28/2023   ALKPHOS 83 02/28/2023   AST 27 02/28/2023   ALT 46 (H) 02/28/2023   PROT 7.9 02/28/2023   ALBUMIN 4.7 02/28/2023   CALCIUM 10.5 (H) 02/28/2023   GFRAA >60 05/01/2019    Speciality Comments: No specialty comments available.  Procedures:  No procedures performed Allergies: Strawberry extract   Assessment / Plan:     Visit Diagnoses: No diagnosis found.  ***  Orders: No orders of the defined types were placed in this encounter.  No orders of the defined types were placed in this encounter.    Follow-Up Instructions: No follow-ups on file.   Fuller Plan, MD  Note - This record has  been created using [employer].  Chart creation errors have been sought, but may not always  have been located. Such creation errors do not reflect on  the standard of medical care.

## 2023-03-05 ENCOUNTER — Ambulatory Visit: Payer: Medicare Other | Admitting: Internal Medicine

## 2023-03-28 ENCOUNTER — Ambulatory Visit: Payer: Medicaid Other | Admitting: Neurology

## 2023-03-29 ENCOUNTER — Other Ambulatory Visit: Payer: Self-pay

## 2023-03-29 ENCOUNTER — Ambulatory Visit (HOSPITAL_COMMUNITY): Payer: Medicare Other | Attending: Family Medicine

## 2023-03-29 DIAGNOSIS — G8929 Other chronic pain: Secondary | ICD-10-CM | POA: Diagnosis not present

## 2023-03-29 DIAGNOSIS — M25551 Pain in right hip: Secondary | ICD-10-CM | POA: Diagnosis present

## 2023-03-29 DIAGNOSIS — M25552 Pain in left hip: Secondary | ICD-10-CM | POA: Diagnosis present

## 2023-03-29 NOTE — Therapy (Signed)
OUTPATIENT PHYSICAL THERAPY LOWER EXTREMITY EVALUATION   Patient Name: [name] Cardosa MRN: [id number] DOB:Aug 03, 1990, 32 y.o., adult Today's Date: 03/29/2023  END OF SESSION:  PT End of Session - 03/29/23 1142     Visit Number 1    Number of Visits 16    Date for PT Re-Evaluation 05/24/23    Authorization Type Medicare and Medicaid    Progress Note Due on Visit 8    PT Start Time 0905    PT Stop Time 0950    PT Time Calculation (min) 45 min    Activity Tolerance Patient limited by pain    Behavior During Therapy [facility] for tasks assessed/performed            Past Medical History:  Diagnosis Date   Anxiety attack    panic attacks   Back pain    Bipolar disorder (HCC)    Chest pain    Constipation    Depression    Drug use    Food allergy    Gallbladder problem    GERD (gastroesophageal reflux disease)    Headache    High blood pressure    History of heart attack    History of stomach ulcers    based on symptoms, around age 46   HLD (hyperlipidemia)    Hypothyroidism    Joint pain    Myocardial infarction [facility]) 2013   age 34- patient reports it was stopped with nitroglycerine   Osteoarthritis    Pericarditis    RA (rheumatoid arthritis) (HCC)    Sleep apnea    SOB (shortness of breath)    Still disease, 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HCC)    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HCC)    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HCC)    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HCC)    Swallowing difficulty    Vitamin D deficiency    Past Surgical History:  Procedure Laterality Date   BALLOON DILATION N/A 12/22/2022   Procedure: BALLOON DILATION;  Surgeon: Lanelle Bal, DO;  [facility];  Service: Endoscopy;  Laterality: N/A;   CHOLECYSTECTOMY N/A 08/29/2021   Procedure: LAPAROSCOPIC CHOLECYSTECTOMY WITH ICG;  Surgeon: Andria Meuse, MD;  Location: [facility];  Service: General;  Laterality: N/A;   ESOPHAGOGASTRODUODENOSCOPY (EGD) WITH PROPOFOL N/A 12/22/2022   Procedure: ESOPHAGOGASTRODUODENOSCOPY (EGD) WITH PROPOFOL;   Surgeon: Lanelle Bal, DO;  [facility];  Service: Endoscopy;  Laterality: N/A;  1:30 pm, asa 3   WISDOM TOOTH EXTRACTION     Patient Active Problem List   Diagnosis Date Noted   Chronic hip pain, bilateral 03/02/2023   BMI 50.0-59.9, adult (HCC) 09/19/2022   OSA on CPAP 08/30/2022   Other specified hypothyroidism 08/22/2022   Elevated cholesterol 08/22/2022   Insulin resistance 07/18/2022   History of tachycardia 06/06/2022   Elevated ALT measurement 06/06/2022   SOB (shortness of breath) on exertion 05/23/2022   Health care maintenance 05/23/2022   Metabolic syndrome 05/23/2022   Vitamin B12 deficiency 05/23/2022   Acquired hypothyroidism 05/09/2022   Mixed hyperlipidemia 05/09/2022   Low HDL (under 40) 05/09/2022   Elevated glucose 05/09/2022   Eating disorder 05/09/2022   Other fatigue 05/09/2022   Class 3 severe obesity with serious comorbidity and body mass index (BMI) of 45.0 to 49.9 in adult [facility]) 05/09/2022   Lumbar degenerative disc disease 03/13/2022   Bipolar depression (HCC) 03/06/2022   Chronic bilateral low back pain with right-sided sciatica 02/15/2022   Vitamin D deficiency 01/04/2022   Loud snoring 01/04/2022  GAD (generalized anxiety disorder) 01/04/2022   Marijuana use 01/04/2022   Chest pain 12/25/2021   Transaminitis 12/25/2021   GERD without esophagitis 12/25/2021   Elevated lipase 12/25/2021   Compulsive skin picking 07/28/2020   Social phobia 07/28/2020   Bipolar I disorder, current or most recent episode depressed, in partial remission (HCC) 07/19/2020   Bilateral primary osteoarthritis of hip 07/19/2020   Allergic rhinitis due to pollen 05/12/2020   MDD (major depressive disorder), recurrent episode, severe (HCC) 05/02/2019   High risk medication use 05/03/2017   Tobacco use 05/29/2016   Arthralgia of multiple sites, bilateral 03/06/2016   Morbid obesity (HCC) 11/05/2014   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   Insomnia 02/18/2014    Bipolar affective disorder, currently depressed, moderate (HCC) 05/25/2011   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    PCP: Gilmore Laroche, FNP  REFERRING PROVIDER: Gilmore Laroche, FNP  REFERRING DIAG: [phone] (ICD-10-CM) - Bilateral hip pain  THERAPY DIAG:  Hip pain, bilateral  Rationale for Evaluation and Treatment: Rehabilitation  ONSET DATE: 10 years ago  SUBJECTIVE:   SUBJECTIVE STATEMENT: Arrives to the clinic with c/o B hip pain and R knee pain (see below). Also reports of R knee giving out. Condition on the hips started 10 years ago when she walked for prolonged periods. Patient went to the ER and had X-ray and was diagnosed with OA. Since then, pain just gradually built up (started as intermittent but now it's constant). Patient had hip injections which only helped a little while PT in the past did not help at all. Patient denies trauma to the hip. Due to worsening hip pain, patient was then referred to outpatient PT evaluation and management in preparation for a planned B hip replacement.  PERTINENT HISTORY: Bulging disc on the lumbar spine PAIN:  Are you having pain? Yes: NPRS scale: 5/10 Pain location: B hip and lateral side R knee Pain description: constant nagging/throbbing Aggravating factors: walking for 2 min (8-9/10) Relieving factors: laying down (3/10)  PRECAUTIONS: None  RED FLAGS: None   WEIGHT BEARING RESTRICTIONS: No  FALLS:  Has patient fallen in last 6 months? No  LIVING ENVIRONMENT: Lives with: lives alone Lives in: House/apartment (1st floor) Stairs: No Has following equipment at home: Environmental consultant - 4 wheeled  OCCUPATION: on disability  PLOF: Independent and Independent with basic ADLs  PATIENT GOALS: "to get a hip surgery/replacement"  NEXT MD VISIT: First week of 04/2023  OBJECTIVE:   DIAGNOSTIC FINDINGS: No recent imaging to date  PATIENT SURVEYS:  FOTO [id number]  COGNITION: Overall cognitive status: Within  functional limits for tasks assessed    MUSCLE LENGTH: Hamstrings: Right moderate restriction Left moderate restriction Maisie Fus test: Right moderate restriction; Left moderate restriction  POSTURE:  True leg length measurement: L = 77 cm, R = 80 cm Standing: Genu valgum L, slightly flexed R knee, R iliac crest higher  PALPATION: No tenderness noted on major bony landmarks of B hips  LOWER EXTREMITY ROM:  Active ROM Right eval Left eval  Hip flexion 60 40  Hip extension [facility]  Hip abduction 10 10  Hip adduction    Hip internal rotation 15 10  Hip external rotation 40 30  Knee flexion [facility]  Knee extension [facility]  Ankle dorsiflexion [facility]  Ankle plantarflexion [facility] WFL  Ankle inversion    Ankle eversion     (Blank rows = not tested)  LOWER EXTREMITY MMT:  MMT Right eval Left eval  Hip flexion 4 4  Hip extension 4- 4-  Hip abduction 2+ 2+  Hip adduction    Hip internal rotation    Hip external rotation    Knee flexion 5 5  Knee extension 4+ 5  Ankle dorsiflexion 5 5  Ankle plantarflexion 5 5  Ankle inversion    Ankle eversion     (Blank rows = not tested)  TRUNK FLEX MMT: 3/5  LOWER EXTREMITY SPECIAL TESTS:  Hip special tests: Luisa Hart (FABER) test: positive , Thomas test: positive , Ely's test: positive , and Piriformis test: positive all test on bilateral Supine-to-sit test did not shorten the R LE  FUNCTIONAL TESTS:  5 times sit to stand: 11.68 sec 2 minute walk test: 419 ft  GAIT: Distance walked: 419 ft Assistive device utilized: None Level of assistance: Complete Independence Comments: decreased step length on B, Trendelenburg gait pattern, decreased swing phase on B, narrow BOS   TODAY'S TREATMENT:                                                                                                                              DATE:  03/29/23 Evaluation and patient education   PATIENT EDUCATION:  Education details: Educated on the  pathoanatomy of hip OA. Educated on the goals and course of rehab. Educated on the benefits of shoe insert Person educated: Patient Education method: Verbal cues Education comprehension: verbalized understanding  HOME EXERCISE PROGRAM: None provided to date  ASSESSMENT:  CLINICAL IMPRESSION: Patient is a 32 y.o. female who was seen today for physical therapy evaluation and treatment for B hip pain. Patient was diagnosed with B hip pain by referring provider further defined by difficulty with walking due to pain, weakness, and decreased soft tissue extensibility. Skilled PT is required to address the impairments and functional limitations listed below. Patient may also benefit from a shoe lift to further address true leg length discrepancy.  OBJECTIVE IMPAIRMENTS: Abnormal gait, decreased activity tolerance, decreased balance, decreased mobility, difficulty walking, decreased ROM, decreased strength, hypomobility, impaired flexibility, and pain.   ACTIVITY LIMITATIONS: lifting, bending, sitting, standing, and stairs  PARTICIPATION LIMITATIONS: cleaning, laundry, driving, shopping, community activity, and occupation  PERSONAL FACTORS: Time since onset of injury/illness/exacerbation and 1-2 comorbidities: low back pain and knee pain  are also affecting patient's functional outcome.   REHAB POTENTIAL: Fair    CLINICAL DECISION MAKING: Evolving/moderate complexity  EVALUATION COMPLEXITY: Moderate   GOALS: Goals reviewed with patient? Yes  SHORT TERM GOALS: Target date: 04/26/23 Pt will demonstrate indep in HEP to facilitate carry-over of skilled services and improve functional outcomes Goal status: INITIAL  LONG TERM GOALS: Target date: 05/24/23  Pt will decrease 5TSTS by at least 3 seconds in order to demonstrate clinically significant improvement in LE strength Baseline: 11.68 sec Goal status: INITIAL  2.  Pt will increase by at least 40 ft in order to demonstrate clinically  significant improvement in community ambulation  Baseline: 419 ft Goal status: INITIAL  3.  Pt will increase FOTO to at least 76 in order to demonstrate significant improvement in function related to ambulation Baseline: 26 Goal status: INITIAL  4.  Patient will demonstrate increase in hip flex and abd ROM by 10 degrees to facilitate ease in ambulation  Baseline: hip abd 10 degrees, hip flex = 40-60 degrees Goal status: INITIAL  5.  Pt will demonstrate increase in LE strength to 4/5 to facilitate ease and safety in ambulation Baseline: 2+/5 Goal status: INITIAL  6.  Patient will be able to ambulate > 200 ft independently on level surface with min pain (3-4/10) and difficulty Baseline: 5/10 on ambulation Goal status: INITIAL   PLAN:  PT FREQUENCY: 2x/week  PT DURATION: 8 weeks  PLANNED INTERVENTIONS: Therapeutic exercises, Therapeutic activity, Neuromuscular re-education, Balance training, Gait training, Patient/Family education, Self Care, and Joint mobilization  PLAN FOR NEXT SESSION: Provide HEP and begin LE strengthening, flexibility and mobility exercises as well as core strengthening. Refer to PCP for a possible prescription of a shoe lift   Iantha Fallen L. Chue Berkovich, PT, DPT, OCS Board-Certified Clinical Specialist in Orthopedic PT PT Compact Privilege # ([place]): [phone] T 03/29/2023, 11:45 AM

## 2023-04-03 ENCOUNTER — Encounter (HOSPITAL_COMMUNITY): Payer: Medicare Other

## 2023-04-03 ENCOUNTER — Ambulatory Visit: Payer: Medicare Other | Admitting: Family Medicine

## 2023-04-05 ENCOUNTER — Ambulatory Visit (HOSPITAL_COMMUNITY): Payer: Medicare Other | Attending: Family Medicine

## 2023-04-05 ENCOUNTER — Encounter: Payer: Medicare Other | Attending: Family Medicine | Admitting: Nutrition

## 2023-04-05 VITALS — Ht 63.0 in | Wt 283.2 lb

## 2023-04-05 DIAGNOSIS — M0820 Juvenile rheumatoid arthritis with systemic onset, unspecified site: Secondary | ICD-10-CM | POA: Diagnosis present

## 2023-04-05 DIAGNOSIS — M25552 Pain in left hip: Secondary | ICD-10-CM | POA: Insufficient documentation

## 2023-04-05 DIAGNOSIS — R5383 Other fatigue: Secondary | ICD-10-CM | POA: Insufficient documentation

## 2023-04-05 DIAGNOSIS — R7309 Other abnormal glucose: Secondary | ICD-10-CM | POA: Insufficient documentation

## 2023-04-05 DIAGNOSIS — G8929 Other chronic pain: Secondary | ICD-10-CM | POA: Insufficient documentation

## 2023-04-05 DIAGNOSIS — Z6841 Body Mass Index (BMI) 40.0 and over, adult: Secondary | ICD-10-CM | POA: Insufficient documentation

## 2023-04-05 DIAGNOSIS — E88819 Insulin resistance, unspecified: Secondary | ICD-10-CM | POA: Insufficient documentation

## 2023-04-05 DIAGNOSIS — Z79899 Other long term (current) drug therapy: Secondary | ICD-10-CM | POA: Diagnosis present

## 2023-04-05 DIAGNOSIS — M0800 Unspecified juvenile rheumatoid arthritis of unspecified site: Secondary | ICD-10-CM | POA: Insufficient documentation

## 2023-04-05 DIAGNOSIS — M25551 Pain in right hip: Secondary | ICD-10-CM | POA: Diagnosis present

## 2023-04-05 DIAGNOSIS — E782 Mixed hyperlipidemia: Secondary | ICD-10-CM | POA: Insufficient documentation

## 2023-04-05 DIAGNOSIS — F5081 Binge eating disorder: Secondary | ICD-10-CM | POA: Insufficient documentation

## 2023-04-05 NOTE — Therapy (Addendum)
OUTPATIENT PHYSICAL THERAPY LOWER EXTREMITY TREATMENT   Patient Name: [name] MRN: [id number] DOB:04-Nov-1990, 32 y.o., adult Today's Date: 04/05/2023  END OF SESSION:  PT End of Session - 04/05/23 1538     Visit Number 2    Number of Visits 16    Date for PT Re-Evaluation 05/24/23    Authorization Type Medicare and Medicaid    Progress Note Due on Visit 8    PT Start Time 1515    PT Stop Time 1600    PT Time Calculation (min) 45 min    Activity Tolerance Patient limited by pain    Behavior During Therapy WFL for tasks assessed/performed             Past Medical History:  Diagnosis Date   Anxiety attack    panic attacks   Back pain    Bipolar disorder (HCC)    Chest pain    Constipation    Depression    Drug use    Food allergy    Gallbladder problem    GERD (gastroesophageal reflux disease)    Headache    High blood pressure    History of heart attack    History of stomach ulcers    based on symptoms, around age 62   HLD (hyperlipidemia)    Hypothyroidism    Joint pain    Myocardial infarction [facility]) 2013   age 49- patient reports it was stopped with nitroglycerine   Osteoarthritis    Pericarditis    RA (rheumatoid arthritis) (HCC)    Sleep apnea    SOB (shortness of breath)    Still disease, 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HCC)    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HCC)    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HCC)    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HCC)    Swallowing difficulty    Vitamin D deficiency    Past Surgical History:  Procedure Laterality Date   BALLOON DILATION N/A 12/22/2022   Procedure: BALLOON DILATION;  Surgeon: Lanelle Bal, DO;  [facility];  Service: Endoscopy;  Laterality: N/A;   CHOLECYSTECTOMY N/A 08/29/2021   Procedure: LAPAROSCOPIC CHOLECYSTECTOMY WITH ICG;  Surgeon: Andria Meuse, MD;  Location: [facility];  Service: General;  Laterality: N/A;   ESOPHAGOGASTRODUODENOSCOPY (EGD) WITH PROPOFOL N/A 12/22/2022   Procedure: ESOPHAGOGASTRODUODENOSCOPY (EGD) WITH PROPOFOL;   Surgeon: Lanelle Bal, DO;  [facility];  Service: Endoscopy;  Laterality: N/A;  1:30 pm, asa 3   WISDOM TOOTH EXTRACTION     Patient Active Problem List   Diagnosis Date Noted   Chronic hip pain, bilateral 03/02/2023   BMI 50.0-59.9, adult (HCC) 09/19/2022   OSA on CPAP 08/30/2022   Other specified hypothyroidism 08/22/2022   Elevated cholesterol 08/22/2022   Insulin resistance 07/18/2022   History of tachycardia 06/06/2022   Elevated ALT measurement 06/06/2022   SOB (shortness of breath) on exertion 05/23/2022   Health care maintenance 05/23/2022   Metabolic syndrome 05/23/2022   Vitamin B12 deficiency 05/23/2022   Acquired hypothyroidism 05/09/2022   Mixed hyperlipidemia 05/09/2022   Low HDL (under 40) 05/09/2022   Elevated glucose 05/09/2022   Eating disorder 05/09/2022   Other fatigue 05/09/2022   Class 3 severe obesity with serious comorbidity and body mass index (BMI) of 45.0 to 49.9 in adult [facility]) 05/09/2022   Lumbar degenerative disc disease 03/13/2022   Bipolar depression (HCC) 03/06/2022   Chronic bilateral low back pain with right-sided sciatica 02/15/2022   Vitamin D deficiency 01/04/2022   Loud snoring 01/04/2022  GAD (generalized anxiety disorder) 01/04/2022   Marijuana use 01/04/2022   Chest pain 12/25/2021   Transaminitis 12/25/2021   GERD without esophagitis 12/25/2021   Elevated lipase 12/25/2021   Compulsive skin picking 07/28/2020   Social phobia 07/28/2020   Bipolar I disorder, current or most recent episode depressed, in partial remission (HCC) 07/19/2020   Bilateral primary osteoarthritis of hip 07/19/2020   Allergic rhinitis due to pollen 05/12/2020   MDD (major depressive disorder), recurrent episode, severe (HCC) 05/02/2019   High risk medication use 05/03/2017   Tobacco use 05/29/2016   Arthralgia of multiple sites, bilateral 03/06/2016   Morbid obesity (HCC) 11/05/2014   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   Insomnia 02/18/2014    Bipolar affective disorder, currently depressed, moderate (HCC) 05/25/2011   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    PCP: Gilmore Laroche, FNP  REFERRING PROVIDER: Gilmore Laroche, FNP  REFERRING DIAG: [phone] (ICD-10-CM) - Bilateral hip pain  THERAPY DIAG:  Hip pain, bilateral  Rationale for Evaluation and Treatment: Rehabilitation  ONSET DATE: 10 years ago  SUBJECTIVE:   SUBJECTIVE STATEMENT: Currently reports of pain on L hip and L knee = 5/10. Unable to attend previous session due to family emergency. Denies recent trauma/falls.  EVAL: Arrives to the clinic with c/o B hip pain and R knee pain (see below). Also reports of R knee giving out. Condition on the hips started 10 years ago when she walked for prolonged periods. Patient went to the ER and had X-ray and was diagnosed with OA. Since then, pain just gradually built up (started as intermittent but now it's constant). Patient had hip injections which only helped a little while PT in the past did not help at all. Patient denies trauma to the hip. Due to worsening hip pain, patient was then referred to outpatient PT evaluation and management in preparation for a planned B hip replacement.  PERTINENT HISTORY: Bulging disc on the lumbar spine PAIN:  Are you having pain? Yes: NPRS scale: 5/10 Pain location: B hip and lateral side R knee Pain description: constant nagging/throbbing Aggravating factors: walking for 2 min (8-9/10) Relieving factors: laying down (3/10)  PRECAUTIONS: None  RED FLAGS: None   WEIGHT BEARING RESTRICTIONS: No  FALLS:  Has patient fallen in last 6 months? No  LIVING ENVIRONMENT: Lives with: lives alone Lives in: House/apartment (1st floor) Stairs: No Has following equipment at home: Environmental consultant - 4 wheeled  OCCUPATION: on disability  PLOF: Independent and Independent with basic ADLs  PATIENT GOALS: "to get a hip surgery/replacement"  NEXT MD VISIT: First week of  04/2023  OBJECTIVE:   DIAGNOSTIC FINDINGS: No recent imaging to date  PATIENT SURVEYS:  FOTO [id number]  COGNITION: Overall cognitive status: Within functional limits for tasks assessed    MUSCLE LENGTH: Hamstrings: Right moderate restriction Left moderate restriction Maisie Fus test: Right moderate restriction; Left moderate restriction  POSTURE:  True leg length measurement: L = 77 cm, R = 80 cm Standing: Genu valgum L, slightly flexed R knee, R iliac crest higher  PALPATION: No tenderness noted on major bony landmarks of B hips  LOWER EXTREMITY ROM:  Active ROM Right eval Left eval  Hip flexion 60 40  Hip extension [facility]  Hip abduction 10 10  Hip adduction    Hip internal rotation 15 10  Hip external rotation 40 30  Knee flexion [facility]  Knee extension [facility]  Ankle dorsiflexion [facility]  Ankle plantarflexion [facility] WFL  Ankle inversion    Ankle  eversion     (Blank rows = not tested)  LOWER EXTREMITY MMT:  MMT Right eval Left eval  Hip flexion 4 4  Hip extension 4- 4-  Hip abduction 2+ 2+  Hip adduction    Hip internal rotation    Hip external rotation    Knee flexion 5 5  Knee extension 4+ 5  Ankle dorsiflexion 5 5  Ankle plantarflexion 5 5  Ankle inversion    Ankle eversion     (Blank rows = not tested)  TRUNK FLEX MMT: 3/5  LOWER EXTREMITY SPECIAL TESTS:  Hip special tests: Luisa Hart (FABER) test: positive , Thomas test: positive , Ely's test: positive , and Piriformis test: positive all test on bilateral Supine-to-sit test did not shorten the R LE  FUNCTIONAL TESTS:  5 times sit to stand: 11.68 sec 2 minute walk test: 419 ft  GAIT: Distance walked: 419 ft Assistive device utilized: None Level of assistance: Complete Independence Comments: decreased step length on B, Trendelenburg gait pattern, decreased swing phase on B, narrow BOS   TODAY'S TREATMENT:                                                                                                                               DATE:  04/05/23 Gait training with SPC on R with 2-point gait pattern and SBA ~ 650-700 ft Seated hamstring stretch x 30" x 3 Grade 2 inf glide x 30" x 3 on each hip Supine MWM on L hip flexion (with hip distraction) x 10 x 2 Bridging x 10 x 2 Hooklying hip adduction squeeze with a pillow x 3" x 10 x 2  Hooklying hip abd isometrics with a belt x 3" x 10 x 2 Heel slides x 10 x 2  03/29/23 Evaluation and patient education   PATIENT EDUCATION:  Education details: Written HEP provided and reviewed. Educated on the importance of using AD during flare ups. Person educated: Patient Education method: Explanation, Demonstration, Verbal cues, and Handouts Education comprehension: verbalized understanding and returned demonstration  HOME EXERCISE PROGRAM: Access Code: [email or web address] URL: [email or web address] Date: 04/05/2023 Prepared by: Krystal Clark  Exercises - Supine Bridge  - 1-2 x daily - 7 x weekly - 2 sets - 10 reps - Supine Hip Adduction Isometric with Ball  - 1-2 x daily - 7 x weekly - 2 sets - 10 reps - 3 hold - Hooklying Isometric Hip Abduction with Belt  - 1-2 x daily - 7 x weekly - 2 sets - 10 reps - 3 hold - Supine Heel Slide  - 1-2 x daily - 7 x weekly - 2 sets - 10 reps  ASSESSMENT:  CLINICAL IMPRESSION: Interventions today were geared towards hip strengthening, mobility and gait training. Reported of a decrease in pain to 2-3/10 with gait training with SPC, MWM, hip abd isometrics, and inf glides. Attempted to perform MWM on the R hip but pain increased to 5/10 on the first two  repetitions so procedure was terminated. Demonstrated appropriate levels of fatigue. Provided mild amount of cueing to ensure correct execution of activity with good carry-over. To date, skilled PT is required to address the impairments and improve function.  EVAL: Patient is a 32 y.o. female who was seen today for physical therapy evaluation and treatment  for B hip pain. Patient was diagnosed with B hip pain by referring provider further defined by difficulty with walking due to pain, weakness, and decreased soft tissue extensibility. Skilled PT is required to address the impairments and functional limitations listed below. Patient may also benefit from a shoe lift to further address true leg length discrepancy.  OBJECTIVE IMPAIRMENTS: Abnormal gait, decreased activity tolerance, decreased balance, decreased mobility, difficulty walking, decreased ROM, decreased strength, hypomobility, impaired flexibility, and pain.   ACTIVITY LIMITATIONS: lifting, bending, sitting, standing, and stairs  PARTICIPATION LIMITATIONS: cleaning, laundry, driving, shopping, community activity, and occupation  PERSONAL FACTORS: Time since onset of injury/illness/exacerbation and 1-2 comorbidities: low back pain and knee pain  are also affecting patient's functional outcome.   REHAB POTENTIAL: Fair    CLINICAL DECISION MAKING: Evolving/moderate complexity  EVALUATION COMPLEXITY: Moderate   GOALS: Goals reviewed with patient? Yes  SHORT TERM GOALS: Target date: 04/26/23 Pt will demonstrate indep in HEP to facilitate carry-over of skilled services and improve functional outcomes Goal status: INITIAL  LONG TERM GOALS: Target date: 05/24/23  Pt will decrease 5TSTS by at least 3 seconds in order to demonstrate clinically significant improvement in LE strength Baseline: 11.68 sec Goal status: INITIAL  2.  Pt will increase by at least 40 ft in order to demonstrate clinically significant improvement in community ambulation  Baseline: 419 ft Goal status: INITIAL  3.  Pt will increase FOTO to at least 76 in order to demonstrate significant improvement in function related to ambulation Baseline: 26 Goal status: INITIAL  4.  Patient will demonstrate increase in hip flex and abd ROM by 10 degrees to facilitate ease in ambulation  Baseline: hip abd 10 degrees,  hip flex = 40-60 degrees Goal status: INITIAL  5.  Pt will demonstrate increase in LE strength to 4/5 to facilitate ease and safety in ambulation Baseline: 2+/5 Goal status: INITIAL  6.  Patient will be able to ambulate > 200 ft independently on level surface with min pain (3-4/10) and difficulty Baseline: 5/10 on ambulation Goal status: INITIAL   PLAN:  PT FREQUENCY: 2x/week  PT DURATION: 8 weeks  PLANNED INTERVENTIONS: Therapeutic exercises, Therapeutic activity, Neuromuscular re-education, Balance training, Gait training, Patient/Family education, Self Care, and Joint mobilization  PLAN FOR NEXT SESSION: Continue POC and may progress as tolerated with emphasis on LE strengthening, flexibility and mobility exercises as well as core strengthening. Refer to PCP for a possible prescription of a shoe lift   Iantha Fallen L. Lurene Robley, PT, DPT, OCS Board-Certified Clinical Specialist in Orthopedic PT PT Compact Privilege # ([place]): [phone] T 04/05/2023, 4:23 PM

## 2023-04-05 NOTE — Patient Instructions (Signed)
Goals Drink 80 oz of water per day Eat 1 piece of fruit with each meal Eat 1 serving of vegetable with lunch and dinner Look into the power bowl meals. Reduce eating out to 3 times per week Talk to PT about water therapy Lose 1 lb per [facility]

## 2023-04-05 NOTE — Progress Notes (Signed)
Medical Nutrition Therapy  Appointment Start time:  0800  Appointment End time:  0900  Primary concerns today: Obesity  Referral diagnosis: E66.01 Preferred learning style: Read, see  Learning readiness: Ready    NUTRITION ASSESSMENT  32 yr old transgender female referred for obesity. Wants to lose weight. PMH Osteoarthritis; STILLS , BPD; Hypothryoidism, Insulin resistance, back pain, DJD, Bipolar and MDD, Hyperlipidemia, Anxiety Has chronic hip pain that limits her ability to exercise a lot. Did get in 4,000 steps yesterday.. Normally she gets in 2000+ steps a day Has been drinking a lot of soda. Doesn't like to cook and eats out mostly fast food. Just put on Adderall;-has helped cut down on soda cravings.. Has increased water intake recently.  Currently staying at a hotel due to not having AC work in her apartment. She notes she is a binge eater towards sweets. She notes she eats a lot of [employer].  She is willing work on making better food choices and trying increase her physical activity. She would like to see about getting water therapy to help her lose weight due to her pain in her hips.   Anthropometrics  Wt Readings from Last 3 Encounters:  04/05/23 283 lb 3.2 oz (128.5 kg)  03/01/23 283 lb 1.9 oz (128.4 kg)  12/22/22 280 lb (127 kg)   Ht Readings from Last 3 Encounters:  04/05/23 5\' 3"  (1.6 m)  03/01/23 5\' 3"  (1.6 m)  12/22/22 5\' 3"  (1.6 m)   Body mass index is 50.17 kg/m. @BMIFA @ Facility age limit for growth %iles is 20 years. Facility age limit for growth %iles is 20 years.    Clinical Medical Hx: See chart Medications: See Chart- ON Metformin for insulin resistance Labs:     Latest Ref Rng & Units 02/28/2023    8:32 AM 12/20/2022    8:48 AM 08/22/2022   11:48 AM  CMP  Glucose 70 - 99 mg/dL 85  161  77   BUN 6 - 20 mg/dL 8  8  9    Creatinine 0.57 - 1.00 mg/dL 0.96  0.45  4.09   Sodium 134 - 144 mmol/L 137  137  142   Potassium 3.5 - 5.2 mmol/L 4.4  3.8   4.3   Chloride 96 - 106 mmol/L 99  105  105   CO2 20 - 29 mmol/L 21  24  21    Calcium 8.7 - 10.2 mg/dL 81.1  8.8  9.2   Total Protein 6.0 - 8.5 g/dL 7.9   7.5   Total Bilirubin 0.0 - 1.2 mg/dL 0.8   0.5   Alkaline Phos 44 - 121 IU/L 83   84   AST 0 - 40 IU/L 27   27   ALT 0 - 32 IU/L 46   45    Lipid Panel     Component Value Date/Time   CHOL 192 02/28/2023 0832   TRIG 163 (H) 02/28/2023 0832   HDL 38 (L) 02/28/2023 0832   CHOLHDL 5.1 (H) 02/28/2023 0832   CHOLHDL 5.1 03/06/2022 0110   VLDL 20 03/06/2022 0110   LDLCALC 125 (H) 02/28/2023 0832   LABVLDL 29 02/28/2023 0832   Lab Results  Component Value Date   HGBA1C 5.6 02/28/2023    Notable Signs/Symptoms:   Lifestyle & Dietary Hx LIves by self with 2 cats  Estimated daily fluid intake: 40 oz Supplements:  Sleep: poor Stress / self-care: has some stress Current average weekly physical activity: ADL due to hip pain. Waiting  to see about hip surgery  24-Hr Dietary Recall Eats 1-2 meals per day; usually fast food. Doesn't like to cook  Estimated Energy Needs Calories: 1200 Carbohydrate: 135g Protein: 90g Fat: 33g   NUTRITION DIAGNOSIS  NB-1.1 Food and nutrition-related knowledge deficit As related to high calorie high fat diet.  As evidenced by BMI 50 and diet recall.   NUTRITION INTERVENTION  Nutrition education (E-1) on the following topics:  Nutrition and Pre Diabetes education provided on My Plate, CHO counting, meal planning, portion sizes, timing of meals, avoiding snacks between meals unless having a low blood sugar, target ranges for A1C and blood sugars, signs/symptoms and treatment of hyper/hypoglycemia, monitoring blood sugars, taking medications as prescribed, benefits of exercising 30 minutes per day and prevention of complications of DM. Lifestyle Medicine  - Whole Food, Plant Predominant Nutrition is highly recommended: Eat Plenty of vegetables, Mushrooms, fruits, Legumes, Whole Grains, Nuts, seeds  in lieu of processed meats, processed snacks/pastries red meat, poultry, eggs.    -It is better to avoid simple carbohydrates including: Cakes, Sweet Desserts, Ice Cream, Soda (diet and regular), Sweet Tea, Candies, Chips, Cookies, Store Bought Juices, Alcohol in Excess of  1-2 drinks a day, Lemonade,  Artificial Sweeteners, Doughnuts, Coffee Creamers, "Sugar-free" Products, etc, etc.  This is not a complete list.....  Exercise: If you are able: 30 -60 minutes a day ,4 days a week, or 150 minutes a week.  The longer the better.  Combine stretch, strength, and aerobic activities.  If you were told in the past that you have high risk for cardiovascular diseases, you may seek evaluation by your heart doctor prior to initiating moderate to intense exercise programs.   Handouts Provided Include  Lifestyle Medicine handouts  Learning Style & Readiness for Change Teaching method utilized: Visual & Auditory  Demonstrated degree of understanding via: Teach Back  Barriers to learning/adherence to lifestyle change: pain in hips for execise.  Goals Established by Pt Goals Drink 80 oz of water per day Eat 1 piece of fruit with each meal Eat 1 serving of vegetable with lunch and dinner Look into the power bowl meals. Reduce eating out to 3 times per week Talk to PT about water therapy Lose 1 lb per [facility]   MONITORING & EVALUATION Dietary intake, weekly physical activity, and weight  in 1 month.  Next Steps  Patient is to work on meal prepping and meal planning.Marland Kitchen

## 2023-04-09 ENCOUNTER — Telehealth: Payer: Medicare Other

## 2023-04-09 DIAGNOSIS — R3989 Other symptoms and signs involving the genitourinary system: Secondary | ICD-10-CM | POA: Diagnosis not present

## 2023-04-09 MED ORDER — NITROFURANTOIN MONOHYD MACRO 100 MG PO CAPS
100.0000 mg | ORAL_CAPSULE | Freq: Two times a day (BID) | ORAL | 0 refills | Status: DC
Start: 2023-04-09 — End: 2023-11-08

## 2023-04-09 NOTE — Patient Instructions (Signed)
[name], thank you for joining [name], PA-C for today's virtual visit.  While this provider is not your primary care provider (PCP), if your PCP is located in our provider database this encounter information will be shared with them immediately following your visit.   A [facility] MyChart account gives you access to today's visit and all your visits, tests, and labs performed at [facility] " click here if you don't have a [facility] MyChart account or go to [email or web address]  Consent: (Patient) [name] provided verbal consent for this virtual visit at the beginning of the encounter.  Current Medications:  Current Outpatient Medications:    nitrofurantoin, macrocrystal-monohydrate, (MACROBID) 100 MG capsule, Take 1 capsule (100 mg total) by mouth 2 (two) times daily., Disp: 10 capsule, Rfl: 0   amitriptyline (ELAVIL) 50 MG tablet, Take 50 mg by mouth at bedtime., Disp: , Rfl:    ARIPiprazole (ABILIFY) 10 MG tablet, Take 1 tablet (10 mg total) by mouth daily. (Patient taking differently: Take 15 mg by mouth daily.), Disp: 30 tablet, Rfl: 0   B-D TB SYRINGE .5CC/27GX1/2" 27G X 1/2" 0.5 ML MISC, USE 1 EACH ONCE A WEEK FOR 84 DAYS, Disp: , Rfl:    BD SAFETYGLIDE NEEDLE 18G X 1-1/2" MISC, USE 1 EACH ONCE A WEEK FOR 84 DAYS, Disp: , Rfl:    clonazePAM (KLONOPIN) 0.5 MG tablet, Take 0.5 mg by mouth daily as needed., Disp: , Rfl:    gabapentin (NEURONTIN) 300 MG capsule, Take 300 mg by mouth 3 (three) times daily., Disp: , Rfl:    ibuprofen (ADVIL) 200 MG tablet, Take 800 mg by mouth every 6 (six) hours as needed (For leg pain)., Disp: , Rfl:    levothyroxine (SYNTHROID) 25 MCG tablet, Take 1 tablet (25 mcg total) by mouth daily., Disp: 90 tablet, Rfl: 1   metFORMIN (GLUCOPHAGE) 500 MG tablet, Take 1 tablet (500 mg total) by mouth 2 (two) times daily with a meal., Disp: 180 tablet, Rfl: 3   omeprazole (PRILOSEC) 40 MG capsule, Take 1 capsule (40 mg  total) by mouth daily., Disp: 30 capsule, Rfl: 11   ondansetron (ZOFRAN) 4 MG tablet, Take 1 tablet (4 mg total) by mouth every 8 (eight) hours as needed for nausea or vomiting., Disp: 20 tablet, Rfl: 0   ondansetron (ZOFRAN-ODT) 4 MG disintegrating tablet, Take 4 mg by mouth 2 (two) times daily., Disp: , Rfl:    promethazine (PHENERGAN) 25 MG tablet, Take 25 mg by mouth every 6 (six) hours as needed., Disp: , Rfl:    rosuvastatin (CRESTOR) 5 MG tablet, Take 1 tablet (5 mg total) by mouth daily., Disp: 90 tablet, Rfl: 1   testosterone cypionate (DEPOTESTOSTERONE CYPIONATE) 200 MG/ML injection, Inject into the skin. 0.3mg  once a week, Disp: , Rfl:    topiramate (TOPAMAX) 25 MG tablet, Take 25 mg by mouth 2 (two) times daily., Disp: , Rfl:    TUBERCULIN SYR 1CC/27GX1/2" 27G X 1/2" 1 ML MISC, Use 1 each once a week for 84 days, Disp: , Rfl:    Vitamin D, Ergocalciferol, (DRISDOL) 1.25 MG (50000 UNIT) CAPS capsule, Take 1 capsule (50,000 Units total) by mouth every 7 (seven) days., Disp: 5 capsule, Rfl: 0   Medications ordered in this encounter:  Meds ordered this encounter  Medications   nitrofurantoin, macrocrystal-monohydrate, (MACROBID) 100 MG capsule    Sig: Take 1 capsule (100 mg total) by mouth 2 (two) times daily.    Dispense:  10  capsule    Refill:  0    Order Specific Question:   Supervising Provider    Answer:   Merrilee Jansky [[id number]]     *If you need refills on other medications prior to your next appointment, please contact your pharmacy*  Follow-Up: Call back or seek an in-person evaluation if the symptoms worsen or if the condition fails to improve as anticipated.  [facility] [phone]  Other Instructions  Urinary Tract Infection, Adult  A urinary tract infection (UTI) is an infection of any part of the urinary tract. The urinary tract includes the kidneys, ureters, bladder, and urethra. These organs make, store, and get rid of urine in the body. An  upper UTI affects the ureters and kidneys. A lower UTI affects the bladder and urethra. What are the causes? Most urinary tract infections are caused by bacteria in your genital area around your urethra, where urine leaves your body. These bacteria grow and cause inflammation of your urinary tract. What increases the risk? You are more likely to develop this condition if: You have a urinary catheter that stays in place. You are not able to control when you urinate or have a bowel movement (incontinence). You are female and you: Use a spermicide or diaphragm for birth control. Have low estrogen levels. Are pregnant. You have certain genes that increase your risk. You are sexually active. You take antibiotic medicines. You have a condition that causes your flow of urine to slow down, such as: An enlarged prostate, if you are female. Blockage in your urethra. A kidney stone. A nerve condition that affects your bladder control (neurogenic bladder). Not getting enough to drink, or not urinating often. You have certain medical conditions, such as: Diabetes. A weak disease-fighting system (immunesystem). Sickle cell disease. Gout. Spinal cord injury. What are the signs or symptoms? Symptoms of this condition include: Needing to urinate right away (urgency). Frequent urination. This may include small amounts of urine each time you urinate. Pain or burning with urination. Blood in the urine. Urine that smells bad or unusual. Trouble urinating. Cloudy urine. Vaginal discharge, if you are female. Pain in the abdomen or the lower back. You may also have: Vomiting or a decreased appetite. Confusion. Irritability or tiredness. A fever or chills. Diarrhea. The first symptom in older adults may be confusion. In some cases, they may not have any symptoms until the infection has worsened. How is this diagnosed? This condition is diagnosed based on your medical history and a physical exam.  You may also have other tests, including: Urine tests. Blood tests. Tests for STIs (sexually transmitted infections). If you have had more than one UTI, a cystoscopy or imaging studies may be done to determine the cause of the infections. How is this treated? Treatment for this condition includes: Antibiotic medicine. Over-the-counter medicines to treat discomfort. Drinking enough water to stay hydrated. If you have frequent infections or have other conditions such as a kidney stone, you may need to see a health care provider who specializes in the urinary tract (urologist). In rare cases, urinary tract infections can cause sepsis. Sepsis is a life-threatening condition that occurs when the body responds to an infection. Sepsis is treated in the hospital with IV antibiotics, fluids, and other medicines. Follow these instructions at home:  Medicines Take over-the-counter and prescription medicines only as told by your health care provider. If you were prescribed an antibiotic medicine, take it as told by your health care provider. Do  not stop using the antibiotic even if you start to feel better. General instructions Make sure you: Empty your bladder often and completely. Do not hold urine for long periods of time. Empty your bladder after sex. Wipe from front to back after urinating or having a bowel movement if you are female. Use each tissue only one time when you wipe. Drink enough fluid to keep your urine pale yellow. Keep all follow-up visits. This is important. Contact a health care provider if: Your symptoms do not get better after 1-2 days. Your symptoms go away and then return. Get help right away if: You have severe pain in your back or your lower abdomen. You have a fever or chills. You have nausea or vomiting. Summary A urinary tract infection (UTI) is an infection of any part of the urinary tract, which includes the kidneys, ureters, bladder, and urethra. Most urinary  tract infections are caused by bacteria in your genital area. Treatment for this condition often includes antibiotic medicines. If you were prescribed an antibiotic medicine, take it as told by your health care provider. Do not stop using the antibiotic even if you start to feel better. Keep all follow-up visits. This is important. This information is not intended to replace advice given to you by your health care provider. Make sure you discuss any questions you have with your health care provider. Document Revised: 02/22/2020 Document Reviewed: 02/27/2020 Elsevier Patient Education  2024 Elsevier Inc.    If you have been instructed to have an in-person evaluation today at a local Urgent Care facility, please use the link below. It will take you to a list of all of our available [facility], including address, phone number and hours of operation. Please do not delay care.  [facility]  If you or a family member do not have a primary care provider, use the link below to schedule a visit and establish care. When you choose a [facility] primary care physician or advanced practice provider, you gain a long-term partner in health. Find a Primary Care Provider  Learn more about [facility]'s in-office and virtual care options: [facility] - Get Care Now

## 2023-04-09 NOTE — Progress Notes (Signed)
Virtual Visit Consent   [name], you are scheduled for a virtual visit with a [facility] provider today. Just as with appointments in the office, your consent must be obtained to participate. Your consent will be active for this visit and any virtual visit you may have with one of our providers in the next 365 days. If you have a MyChart account, a copy of this consent can be sent to you electronically.  As this is a virtual visit, video technology does not allow for your provider to perform a traditional examination. This may limit your provider's ability to fully assess your condition. If your provider identifies any concerns that need to be evaluated in person or the need to arrange testing (such as labs, EKG, etc.), we will make arrangements to do so. Although advances in technology are sophisticated, we cannot ensure that it will always work on either your end or our end. If the connection with a video visit is poor, the visit may have to be switched to a telephone visit. With either a video or telephone visit, we are not always able to ensure that we have a secure connection.  By engaging in this virtual visit, you consent to the provision of healthcare and authorize for your insurance to be billed (if applicable) for the services provided during this visit. Depending on your insurance coverage, you may receive a charge related to this service.  I need to obtain your verbal consent now. Are you willing to proceed with your visit today? [name] has provided verbal consent on 04/09/2023 for a virtual visit (video or telephone). Kathryn Loveless, PA-C  Date: 04/09/2023 10:16 AM  Virtual Visit via Video Note   I, [name], connected with  [name]  ([id number], [birth year]) on 04/09/23 at 10:15 AM EDT by a video-enabled telemedicine application and verified that I am speaking with the correct person using two identifiers.  Location: Patient: Virtual Visit Location  Patient: Mobile Provider: Virtual Visit Location Provider: Home Office   I discussed the limitations of evaluation and management by telemedicine and the availability of in person appointments. The patient expressed understanding and agreed to proceed.    History of Present Illness: [name] is a 32 y.o. who identifies as a transgender female who was assigned adult at birth, and is being seen today for possible UTI.  HPI: Urinary Tract Infection  This is a new problem. The current episode started yesterday. The problem occurs every urination. The problem has been gradually worsening. The quality of the pain is described as burning. There has been no fever. Associated symptoms include chills, frequency, hematuria (when wiping yesterday some), hesitancy, sweats and urgency. Pertinent negatives include no flank pain, nausea or vomiting. He has tried NSAIDs for the symptoms. The treatment provided no relief. There is no history of recurrent UTIs.     Problems:  Patient Active Problem List   Diagnosis Date Noted   Chronic hip pain, bilateral 03/02/2023   BMI 50.0-59.9, adult (HCC) 09/19/2022   OSA on CPAP 08/30/2022   Other specified hypothyroidism 08/22/2022   Elevated cholesterol 08/22/2022   Insulin resistance 07/18/2022   History of tachycardia 06/06/2022   Elevated ALT measurement 06/06/2022   SOB (shortness of breath) on exertion 05/23/2022   Health care maintenance 05/23/2022   Metabolic syndrome 05/23/2022   Vitamin B12 deficiency 05/23/2022   Acquired hypothyroidism 05/09/2022   Mixed hyperlipidemia 05/09/2022   Low HDL (under 40) 05/09/2022  Elevated glucose 05/09/2022   Eating disorder 05/09/2022   Other fatigue 05/09/2022   Class 3 severe obesity with serious comorbidity and body mass index (BMI) of 45.0 to 49.9 in adult (HCC) 05/09/2022   Lumbar degenerative disc disease 03/13/2022   Bipolar depression (HCC) 03/06/2022   Chronic bilateral low back pain with  right-sided sciatica 02/15/2022   Vitamin D deficiency 01/04/2022   Loud snoring 01/04/2022   GAD (generalized anxiety disorder) 01/04/2022   Marijuana use 01/04/2022   Chest pain 12/25/2021   Transaminitis 12/25/2021   GERD without esophagitis 12/25/2021   Elevated lipase 12/25/2021   Compulsive skin picking 07/28/2020   Social phobia 07/28/2020   Bipolar I disorder, current or most recent episode depressed, in partial remission (HCC) 07/19/2020   Bilateral primary osteoarthritis of hip 07/19/2020   Allergic rhinitis due to pollen 05/12/2020   MDD (major depressive disorder), recurrent episode, severe (HCC) 05/02/2019   High risk medication use 05/03/2017   Tobacco use 05/29/2016   Arthralgia of multiple sites, bilateral 03/06/2016   Morbid obesity (HCC) 11/05/2014   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   Insomnia 02/18/2014   Bipolar affective disorder, currently depressed, moderate (HCC) 05/25/2011   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 (Patient taking differently: Take 15 mg by mouth daily.), Disp: 30 tablet, Rfl: 0   B-D TB SYRINGE .5CC/27GX1/2" 27G X 1/2" 0.5 ML MISC, USE 1 EACH ONCE A WEEK FOR 84 DAYS, Disp: , Rfl:    BD SAFETYGLIDE NEEDLE 18G X 1-1/2" MISC, USE 1 EACH ONCE A WEEK FOR 84 DAYS, Disp: , Rfl:    clonazePAM (KLONOPIN) 0.5 MG tablet, Take 0.5 mg by mouth daily as needed., Disp: , Rfl:    gabapentin (NEURONTIN) 300 MG capsule, Take 300 mg by mouth 3 (three) times daily., Disp: , Rfl:    ibuprofen (ADVIL)  200 MG tablet, Take 800 mg by mouth every 6 (six) hours as needed (For leg pain)., Disp: , Rfl:    levothyroxine (SYNTHROID) 25 MCG tablet, Take 1 tablet (25 mcg total) by mouth daily., Disp: 90 tablet, Rfl: 1   metFORMIN (GLUCOPHAGE) 500 MG tablet, Take 1 tablet (500 mg total) by mouth 2 (two) times daily with a meal., Disp: 180 tablet, Rfl: 3   omeprazole (PRILOSEC) 40 MG capsule, Take 1 capsule (40 mg total) by mouth daily., Disp: 30 capsule, Rfl: 11   ondansetron (ZOFRAN) 4 MG tablet, Take 1 tablet (4 mg total) by mouth every 8 (eight) hours as needed for nausea or vomiting., Disp: 20 tablet, Rfl: 0   ondansetron (ZOFRAN-ODT) 4 MG disintegrating tablet, Take 4 mg by mouth 2 (two) times daily., Disp: , Rfl:    promethazine (PHENERGAN) 25 MG tablet, Take 25 mg by mouth every 6 (six) hours as needed., Disp: , Rfl:    rosuvastatin (CRESTOR) 5 MG tablet, Take 1 tablet (5 mg total) by mouth daily., Disp: 90 tablet, Rfl: 1   testosterone cypionate (DEPOTESTOSTERONE CYPIONATE) 200 MG/ML injection, Inject into the skin. 0.3mg  once a week, Disp: ,  Rfl:    topiramate (TOPAMAX) 25 MG tablet, Take 25 mg by mouth 2 (two) times daily., Disp: , Rfl:    TUBERCULIN SYR 1CC/27GX1/2" 27G X 1/2" 1 ML MISC, Use 1 each once a week for 84 days, Disp: , Rfl:    Vitamin D, Ergocalciferol, (DRISDOL) 1.25 MG (50000 UNIT) CAPS capsule, Take 1 capsule (50,000 Units total) by mouth every 7 (seven) days., Disp: 5 capsule, Rfl: 0  Observations/Objective: Patient is well-developed, well-nourished in no acute distress.  Resting comfortably Head is normocephalic, atraumatic.  No labored breathing.  Speech is clear and coherent with logical content.  Patient is alert and oriented at baseline.    Assessment and Plan: 1. Suspected UTI - nitrofurantoin, macrocrystal-monohydrate, (MACROBID) 100 MG capsule; Take 1 capsule (100 mg total) by mouth 2 (two) times daily.  Dispense: 10 capsule; Refill: 0  - Worsening symptoms.  -  Will treat empirically with Macrobid - May use AZO for bladder spasms - Continue to push fluids.  - Seek in person evaluation for urine culture if symptoms do not improve or if they worsen.    Follow Up Instructions: I discussed the assessment and treatment plan with the patient. The patient was provided an opportunity to ask questions and all were answered. The patient agreed with the plan and demonstrated an understanding of the instructions.  A copy of instructions were sent to the patient via MyChart unless otherwise noted below.    The patient was advised to call back or seek an in-person evaluation if the symptoms worsen or if the condition fails to improve as anticipated.  Time:  I spent 8 minutes with the patient via telehealth technology discussing the above problems/concerns.    Kathryn Loveless, PA-C

## 2023-04-10 ENCOUNTER — Encounter (HOSPITAL_COMMUNITY): Payer: Medicare Other

## 2023-04-10 ENCOUNTER — Telehealth (HOSPITAL_COMMUNITY): Payer: Self-pay

## 2023-04-10 NOTE — Telephone Encounter (Signed)
No show, called and left message concerning missed apt. Included next apt date and time, requested pt to call and cancel/reschedule if unable to make it in the future.   Becky Sax, LPTA/CLT; [facility] [phone]

## 2023-04-12 ENCOUNTER — Encounter (HOSPITAL_COMMUNITY): Payer: Medicare Other

## 2023-04-12 ENCOUNTER — Telehealth (HOSPITAL_COMMUNITY): Payer: Self-pay

## 2023-04-12 NOTE — Telephone Encounter (Signed)
Called patient today for his 2nd no show. Patient did not answer and PT left a voice message. Informed patient about the facility's policies on cancellation/no shows, his future appointment and that he can call if he has questions.  Tish Frederickson. Raquon Milledge, PT, DPT, OCS Board-Certified Clinical Specialist in Orthopedic PT PT Compact Privilege # ([place]): [phone]

## 2023-04-17 ENCOUNTER — Encounter (HOSPITAL_COMMUNITY): Payer: Medicare Other

## 2023-04-19 ENCOUNTER — Encounter (HOSPITAL_COMMUNITY): Payer: Medicare Other

## 2023-04-24 ENCOUNTER — Encounter (HOSPITAL_COMMUNITY): Payer: Medicare Other

## 2023-04-24 ENCOUNTER — Encounter: Payer: Self-pay | Admitting: Internal Medicine

## 2023-04-24 ENCOUNTER — Ambulatory Visit: Payer: Medicare Other | Attending: Internal Medicine

## 2023-04-24 ENCOUNTER — Ambulatory Visit (INDEPENDENT_AMBULATORY_CARE_PROVIDER_SITE_OTHER): Payer: Medicare Other | Admitting: Internal Medicine

## 2023-04-24 VITALS — BP 139/85 | HR 99 | Resp 14 | Ht 63.0 in | Wt 281.0 lb

## 2023-04-24 DIAGNOSIS — M25551 Pain in right hip: Secondary | ICD-10-CM | POA: Diagnosis not present

## 2023-04-24 DIAGNOSIS — G8929 Other chronic pain: Secondary | ICD-10-CM

## 2023-04-24 DIAGNOSIS — M0800 Unspecified juvenile rheumatoid arthritis of unspecified site: Secondary | ICD-10-CM | POA: Diagnosis present

## 2023-04-24 DIAGNOSIS — R5383 Other fatigue: Secondary | ICD-10-CM | POA: Insufficient documentation

## 2023-04-24 DIAGNOSIS — Z79899 Other long term (current) drug therapy: Secondary | ICD-10-CM

## 2023-04-24 DIAGNOSIS — M25552 Pain in left hip: Secondary | ICD-10-CM | POA: Insufficient documentation

## 2023-04-24 DIAGNOSIS — M0820 Juvenile rheumatoid arthritis with systemic onset, unspecified site: Secondary | ICD-10-CM | POA: Diagnosis not present

## 2023-04-24 NOTE — Progress Notes (Unsigned)
Office Visit Note  Patient: [name] Pham             Date of Birth: 1990-10-02           MRN: [id number]             PCP: Gilmore Laroche, FNP Referring: Gilmore Laroche, FNP Visit Date: 04/24/2023   Subjective:  Follow-up (Patient states they think they are having a flare. Patient states their fever spiked to about a 102 yesterday afternoon and they were tested for everything and it was all negative. Patient states the symptoms are currently occurring in the afternoon. Patient states their heart starts racing as well. )   History of Present Illness: [name] is a 32 y.o. 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  Developed new and worsening symptoms since last week with low-grade fever and diffuse bodyaches occurring only at nighttime and resolving each morning during the day.  This feels typical for pattern of stills disease symptoms from the past but not having any visible rashes.  Also no new mouth sores.  Also experiencing a fast heart rate and palpitations very quickly with even minimal movement and activity.  They went to the emergency department last night for evaluation and had negative COVID and other respiratory viral test.  Took antibiotics for a UTI last month but symptoms had fully resolved 4 weeks prior to this current onset.  Previous HPI 08/29/22 [name] is a 32 y.o. adult here for arthritis with history of AOSD and unspecified pattern of JIA. They were on initial treatment with prednisone and methotrexate starting from the age of 2 for onset of inflammatory arthritis continued until age 59 before tapering off due to disease improvement. Onset of AOSD in 2014 with multiple episodes until most recent significant flare that was in 2017. This is characterized with systemic symptoms including fevers up to 104 Fahrenheit, joint pain, diffuse erythematous body rashes, and oral pustular lesions  with diffuse pain all over.  The symptoms have responded to oral prednisone but has been tried on a number of steroid sparing medications. Initial biologic therapy with anakinra but was transition to Humira for long-term maintenance treatment.  Stopped Humira in 2021 due to no recent flares it was not seeing any significant improvement in the day-to-day joint pain symptoms with treatment.  Other medications tried include methotrexate and leflunomide this was complicated by GI intolerance and also worsening of chronic liver transaminase elevations.   He reports still experiencing low-grade fevers never above 101 Fahrenheit frequently occurring around nighttime especially after a day with a higher amount of stress and physical activity.  Bilateral hip pain is the most limiting symptom. Some right knee pain with walking and has noticed some abnormal rotation or posture on this side. Orthopedic evaluation has also noticed some spine curvature and disc slipping contributing to back pain. Takes ibuprofen very frequently for this reports about 800 mg 3-4 times daily.  Does not see any improvement with Tylenol.  Does not take any other chronic medication for joint pain.  He is seeing a weight management clinic for trying to lose weight to be a candidate for total hip arthroplasty.  This has been challenging so far though due to limited exercise capacity from joint pain.   Review of Systems  Constitutional:  Positive for fatigue.  HENT:  Positive for mouth dryness. Negative for mouth sores.   Eyes:  Positive for dryness.  Respiratory:  Positive for shortness of breath.   Cardiovascular:  Positive for chest pain. Negative for palpitations.  Gastrointestinal:  Negative for blood in stool, constipation and diarrhea.  Endocrine: Negative for increased urination.  Genitourinary:  Negative for involuntary urination.  Musculoskeletal:  Positive for joint pain, gait problem, joint pain, joint swelling, myalgias, muscle  weakness, morning stiffness, muscle tenderness and myalgias.  Skin:  Positive for sensitivity to sunlight. Negative for color change, rash and hair loss.  Allergic/Immunologic: Negative for susceptible to infections.  Neurological:  Positive for headaches. Negative for dizziness.  Hematological:  Negative for swollen glands.  Psychiatric/Behavioral:  Positive for depressed mood and sleep disturbance. The patient is nervous/anxious.     PMFS History:  Patient Active Problem List   Diagnosis Date Noted   Chronic hip pain, bilateral 03/02/2023   BMI 50.0-59.9, adult (HCC) 09/19/2022   OSA on CPAP 08/30/2022   Other specified hypothyroidism 08/22/2022   Elevated cholesterol 08/22/2022   Insulin resistance 07/18/2022   History of tachycardia 06/06/2022   Elevated ALT measurement 06/06/2022   SOB (shortness of breath) on exertion 05/23/2022   Health care maintenance 05/23/2022   Metabolic syndrome 05/23/2022   Vitamin B12 deficiency 05/23/2022   Acquired hypothyroidism 05/09/2022   Mixed hyperlipidemia 05/09/2022   Low HDL (under 40) 05/09/2022   Elevated glucose 05/09/2022   Eating disorder 05/09/2022   Other fatigue 05/09/2022   Class 3 severe obesity with serious comorbidity and body mass index (BMI) of 45.0 to 49.9 in adult (HCC) 05/09/2022   Lumbar degenerative disc disease 03/13/2022   Bipolar depression (HCC) 03/06/2022   Chronic bilateral low back pain with right-sided sciatica 02/15/2022   Vitamin D deficiency 01/04/2022   Loud snoring 01/04/2022   GAD (generalized anxiety disorder) 01/04/2022   Marijuana use 01/04/2022   Chest pain 12/25/2021   Transaminitis 12/25/2021   GERD without esophagitis 12/25/2021   Elevated lipase 12/25/2021   Compulsive skin picking 07/28/2020   Social phobia 07/28/2020   Bipolar I disorder, current or most recent episode depressed, in partial remission (HCC) 07/19/2020   Bilateral primary osteoarthritis of hip 07/19/2020   Allergic  rhinitis due to pollen 05/12/2020   MDD (major depressive disorder), recurrent episode, severe (HCC) 05/02/2019   High risk medication use 05/03/2017   Tobacco use 05/29/2016   Arthralgia of multiple sites, bilateral 03/06/2016   Morbid obesity (HCC) 11/05/2014   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   Insomnia 02/18/2014   Bipolar affective disorder, currently depressed, moderate (HCC) 05/25/2011   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    Past Medical History:  Diagnosis Date   ADHD    Anxiety attack    panic attacks   Back pain    Bipolar disorder (HCC)    Chest pain    Constipation    Depression    Drug use    Food allergy    Gallbladder problem    GERD (gastroesophageal reflux disease)    Headache    High blood pressure    History of heart attack    History of stomach ulcers    based on symptoms, around age 66   HLD (hyperlipidemia)    Hypothyroidism    Joint pain    Myocardial infarction [facility]) 2013   age 54- patient reports it was stopped with nitroglycerine   Osteoarthritis    Pericarditis    RA (rheumatoid arthritis) (HCC)    Sleep apnea    SOB (shortness of breath)  Still disease, 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HCC)    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HCC)    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HCC)    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HCC)    Swallowing difficulty    Vitamin D deficiency     Family History  Problem Relation Age of Onset   Heart disease Mother    Heart attack Mother    High blood pressure Mother    Sudden death Mother    Depression Father    Rheum arthritis Father    Hypertension Father    Bipolar disorder Brother    Colon cancer Neg Hx    Past Surgical History:  Procedure Laterality Date   BALLOON DILATION N/A 12/22/2022   Procedure: BALLOON DILATION;  Surgeon: Lanelle Bal, DO;  [facility];  Service: Endoscopy;  Laterality: N/A;   CHOLECYSTECTOMY N/A 08/29/2021   Procedure: LAPAROSCOPIC CHOLECYSTECTOMY WITH ICG;  Surgeon: Andria Meuse, MD;  Location: [facility];   Service: General;  Laterality: N/A;   ESOPHAGOGASTRODUODENOSCOPY (EGD) WITH PROPOFOL N/A 12/22/2022   Procedure: ESOPHAGOGASTRODUODENOSCOPY (EGD) WITH PROPOFOL;  Surgeon: Lanelle Bal, DO;  Location: [facility];  Service: Endoscopy;  Laterality: N/A;  1:30 pm, asa 3   WISDOM TOOTH EXTRACTION     Social History   Social History Narrative   Not on file   Immunization History  Administered Date(s) Administered   Influenza,inj,Quad PF,6+ Mos 05/03/2017, 04/25/2022   Pneumococcal Conjugate-13 10/11/2017   Tdap 12/07/2022     Objective: Vital Signs: BP 139/85 (BP Location: Left Arm, Patient Position: Sitting, Cuff Size: Normal)   Pulse 99   Resp 14   Ht 5\' 3"  (1.6 m)   Wt 281 lb (127.5 kg)   BMI 49.78 kg/m    Physical Exam Constitutional:      Appearance: He is obese.  HENT:     Mouth/Throat:     Mouth: Mucous membranes are moist.     Pharynx: Oropharynx is clear.  Eyes:     Conjunctiva/sclera: Conjunctivae normal.  Cardiovascular:     Rate and Rhythm: Regular rhythm. Tachycardia present.  Pulmonary:     Effort: Pulmonary effort is normal.     Breath sounds: Normal breath sounds.  Musculoskeletal:     Right lower leg: No edema.     Left lower leg: No edema.  Lymphadenopathy:     Cervical: No cervical adenopathy.  Skin:    General: Skin is warm and dry.     Findings: No rash.  Neurological:     Mental Status: He is alert.  Psychiatric:        Mood and Affect: Mood normal.      Musculoskeletal Exam:  Shoulders full ROM no tenderness or swelling Elbows full ROM no tenderness or swelling Wrists full ROM no tenderness or swelling Fingers full ROM no tenderness or swelling Right worse than left bilateral hip pain provoked on internal rotation with decreased range of motion, no significant lateral tenderness to pressure Knees full ROM no tenderness or swelling Ankles full ROM no tenderness or swelling MTPs full ROM no tenderness or  swelling  Investigation: No additional findings.  Imaging: XR HIPS BILAT W OR W/O PELVIS 3-4 VIEWS  Result Date: 04/24/2023 X-ray hips and pelvis bilateral Severe joint space narrowing and marginal bone spurring and both hip joints.  No visible erosions.  Degenerative change present in the visualized portion of the lumbar spine.  Possible mild degenerative changes of SI joints but patent bilaterally without visible erosions or increased sclerosis. Impression Moderately  severe pain bilateral hip osteoarthritis   Recent Labs: Lab Results  Component Value Date   WBC 12.3 (H) 04/24/2023   HGB 14.4 04/24/2023   PLT 320 04/24/2023   NA 137 02/28/2023   K 4.4 02/28/2023   CL 99 02/28/2023   CO2 21 02/28/2023   GLUCOSE 85 02/28/2023   BUN 8 02/28/2023   CREATININE 1.19 (H) 02/28/2023   BILITOT 0.8 02/28/2023   ALKPHOS 83 02/28/2023   AST 27 02/28/2023   ALT 46 (H) 02/28/2023   PROT 7.9 02/28/2023   ALBUMIN 4.7 02/28/2023   CALCIUM 10.5 (H) 02/28/2023   GFRAA >60 05/01/2019    Speciality Comments: No specialty comments available.  Procedures:  No procedures performed Allergies: Strawberry extract   Assessment / Plan:     Visit Diagnoses: 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HCC) - Plan: CBC with Differential/Platelet, Sedimentation rate, C-reactive protein, Ferritin, predniSONE (DELTASONE) 10 MG tablet  Possibly early into a new flareup of stills disease or possibly RA exacerbation.  Rechecking CBC sed rate CRP and ferritin for inflammatory activity monitoring.  If significantly elevated would recommend initial treatment with short-term prednisone taper.  Previously had well-controlled disease for years likely due to but with no flares for about 3 years off treatment we will wait on resuming for now to see how current episode resolves.  Chronic hip pain, bilateral - Plan: XR HIPS BILAT W OR W/O PELVIS 3-4 VIEWS, Ferritin  Checking x-ray hips and bilateral pelvis for chronic hip pain.  This has been  longstanding and not episodic exam is consistent for intra-articular pathology x-ray appears moderately severe disease.  Recommend referral to orthopedic surgery for evaluation.  Juvenile rheumatoid arthritis (HCC) - Plan: Ferritin, predniSONE (DELTASONE) 10 MG tablet  High risk medication use - Plan: QuantiFERON-TB Gold Plus  Checking QuantiFERON screening anticipating possible need to restart biologic DMARD.    Orders: Orders Placed This Encounter  Procedures   XR HIPS BILAT W OR W/O PELVIS 3-4 VIEWS   CBC with Differential/Platelet   Sedimentation rate   C-reactive protein   Ferritin   QuantiFERON-TB Gold Plus   Meds ordered this encounter  Medications   predniSONE (DELTASONE) 10 MG tablet    Sig: Take 4 tablets (40 mg total) by mouth daily with breakfast for 3 days, THEN 3 tablets (30 mg total) daily with breakfast for 3 days, THEN 2 tablets (20 mg total) daily with breakfast for 3 days, THEN 1 tablet (10 mg total) daily with breakfast for 3 days.    Dispense:  30 tablet    Refill:  0     Follow-Up Instructions: No follow-ups on file.   Fuller Plan, MD  Note - This record has been created using [employer].  Chart creation errors have been sought, but may not always  have been located. Such creation errors do not reflect on  the standard of medical care.

## 2023-04-25 ENCOUNTER — Encounter: Payer: Self-pay | Admitting: Family Medicine

## 2023-04-25 ENCOUNTER — Other Ambulatory Visit: Payer: Self-pay | Admitting: *Deleted

## 2023-04-25 ENCOUNTER — Ambulatory Visit (INDEPENDENT_AMBULATORY_CARE_PROVIDER_SITE_OTHER): Payer: Medicare Other | Admitting: Family Medicine

## 2023-04-25 DIAGNOSIS — Z6841 Body Mass Index (BMI) 40.0 and over, adult: Secondary | ICD-10-CM | POA: Diagnosis not present

## 2023-04-25 DIAGNOSIS — G8929 Other chronic pain: Secondary | ICD-10-CM

## 2023-04-25 DIAGNOSIS — E7849 Other hyperlipidemia: Secondary | ICD-10-CM | POA: Diagnosis not present

## 2023-04-25 LAB — CBC WITH DIFFERENTIAL/PLATELET
Absolute Monocytes: 308 cells/uL (ref 200–950)
Basophils Absolute: 12 cells/uL (ref 0–200)
Basophils Relative: 0.1 %
Eosinophils Absolute: 0 cells/uL — ABNORMAL LOW (ref 15–500)
Eosinophils Relative: 0 %
HCT: 45.2 % — ABNORMAL HIGH (ref 35.0–45.0)
Hemoglobin: 14.4 g/dL (ref 11.7–15.5)
Lymphs Abs: 1292 cells/uL (ref 850–3900)
MCH: 27.1 pg (ref 27.0–33.0)
MCHC: 31.9 g/dL — ABNORMAL LOW (ref 32.0–36.0)
MCV: 85 fL (ref 80.0–100.0)
MPV: 10.3 fL (ref 7.5–12.5)
Monocytes Relative: 2.5 %
Neutro Abs: 10689 cells/uL — ABNORMAL HIGH (ref 1500–7800)
Neutrophils Relative %: 86.9 %
Platelets: 320 10*3/uL (ref 140–400)
RBC: 5.32 10*6/uL — ABNORMAL HIGH (ref 3.80–5.10)
RDW: 14.9 % (ref 11.0–15.0)
Total Lymphocyte: 10.5 %
WBC: 12.3 10*3/uL — ABNORMAL HIGH (ref 3.8–10.8)

## 2023-04-25 LAB — SEDIMENTATION RATE: Sed Rate: 38 mm/h — ABNORMAL HIGH (ref 0–20)

## 2023-04-25 LAB — FERRITIN: Ferritin: 23 ng/mL (ref 16–154)

## 2023-04-25 LAB — C-REACTIVE PROTEIN: CRP: 34.3 mg/L — ABNORMAL HIGH (ref <8.0)

## 2023-04-25 MED ORDER — WEGOVY 0.25 MG/0.5ML ~~LOC~~ SOAJ: 0.2500 mg | SUBCUTANEOUS | 0 refills | Status: DC

## 2023-04-25 MED ORDER — ROSUVASTATIN CALCIUM 10 MG PO TABS
10.0000 mg | ORAL_TABLET | Freq: Every day | ORAL | 3 refills | Status: AC
Start: 2023-04-25 — End: ?

## 2023-04-25 MED ORDER — PREDNISONE 10 MG PO TABS
ORAL_TABLET | ORAL | 0 refills | Status: AC
Start: 2023-04-25 — End: 2023-05-07

## 2023-04-25 NOTE — Progress Notes (Signed)
Established Patient Office Visit  Subjective:  Patient ID: [name], adult    DOB: 14-Dec-1990  Age: 32 y.o. MRN: [id number]  CC:  Chief Complaint  Patient presents with   Follow-up    Follow up discuss wegovy    HPI [name] is a 32 y.o. adult  presents to discuss weight loss.  Obesity: The patient reports that she has been working with a nutritionist and adhering to her diet and nutrition plan, as well as increasing her physical activity; however, she has experienced minimal changes in her weight. She has a history of Still's disease and is currently on prednisone for a flare-up. The patient expresses interest in discussing the possibility of starting [facility] today for weight management.  Past Medical History:  Diagnosis Date   ADHD    Anxiety attack    panic attacks   Back pain    Bipolar disorder (HCC)    Chest pain    Constipation    Depression    Drug use    Food allergy    Gallbladder problem    GERD (gastroesophageal reflux disease)    Headache    High blood pressure    History of heart attack    History of stomach ulcers    based on symptoms, around age 11   HLD (hyperlipidemia)    Hypothyroidism    Joint pain    Myocardial infarction [facility]) 2013   age 15- patient reports it was stopped with nitroglycerine   Osteoarthritis    Pericarditis    RA (rheumatoid arthritis) (HCC)    Sleep apnea    SOB (shortness of breath)    Still disease, 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HCC)    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HCC)    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HCC)    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HCC)    Swallowing difficulty    Vitamin D deficiency     Past Surgical History:  Procedure Laterality Date   BALLOON DILATION N/A 12/22/2022   Procedure: BALLOON DILATION;  Surgeon: Lanelle Bal, DO;  [facility];  Service: Endoscopy;  Laterality: N/A;   CHOLECYSTECTOMY N/A 08/29/2021   Procedure: LAPAROSCOPIC CHOLECYSTECTOMY WITH ICG;  Surgeon: Andria Meuse, MD;  Location: [facility];  Service:  General;  Laterality: N/A;   ESOPHAGOGASTRODUODENOSCOPY (EGD) WITH PROPOFOL N/A 12/22/2022   Procedure: ESOPHAGOGASTRODUODENOSCOPY (EGD) WITH PROPOFOL;  Surgeon: Lanelle Bal, DO;  [facility];  Service: Endoscopy;  Laterality: N/A;  1:30 pm, asa 3   WISDOM TOOTH EXTRACTION      Family History  Problem Relation Age of Onset   Heart disease Mother    Heart attack Mother    High blood pressure Mother    Sudden death Mother    Depression Father    Rheum arthritis Father    Hypertension Father    Bipolar disorder Brother    Colon cancer Neg Hx     Social History   Socioeconomic History   Marital status: Significant Other    Spouse name: Not on file   Number of children: Not on file   Years of education: Not on file   Highest education level: GED or equivalent  Occupational History   Not on file  Tobacco Use   Smoking status: Former    Current packs/day: 0.00    Average packs/day: 1 pack/day for 10.4 years (10.4 ttl pk-yrs)    Types: Cigarettes, E-cigarettes    Start date: 07/27/2009    Quit date: 12/2019  Years since quitting: 3.3    Passive exposure: Current   Smokeless tobacco: Never  Vaping Use   Vaping status: Every Day   Substances: Nicotine, Flavoring  Substance and Sexual Activity   Alcohol use: Not Currently   Drug use: Not Currently    Types: Marijuana    Comment: daily   Sexual activity: Yes    Partners: Female    Birth control/protection: None  Other Topics Concern   Not on file  Social History Narrative   Not on file   Social Determinants of Health   Financial Resource Strain: Low Risk  (02/28/2023)   Overall Financial Resource Strain (CARDIA)    Difficulty of Paying Living Expenses: Not very hard  Food Insecurity: No Food Insecurity (02/28/2023)   Hunger Vital Sign    Worried About Running Out of Food in the Last Year: Never true    Ran Out of Food in the Last Year: Never true  Recent Concern: Food Insecurity - Food Insecurity  Present (12/08/2022)   Received from [facility], [facility]   Hunger Vital Sign    Worried About Running Out of Food in the Last Year: Sometimes true    Ran Out of Food in the Last Year: Never true  Transportation Needs: No Transportation Needs (02/28/2023)   PRAPARE - Administrator, Civil Service (Medical): No    Lack of Transportation (Non-Medical): No  Physical Activity: Insufficiently Active (02/28/2023)   Exercise Vital Sign    Days of Exercise per Week: 2 days    Minutes of Exercise per Session: 10 min  Stress: Stress Concern Present (02/28/2023)   [employer] of Occupational Health - Occupational Stress Questionnaire    Feeling of Stress : Rather much  Social Connections: Unknown (02/28/2023)   Social Connection and Isolation Panel [NHANES]    Frequency of Communication with Friends and Family: More than three times a week    Frequency of Social Gatherings with Friends and Family: More than three times a week    Attends Religious Services: Patient declined    Database administrator or Organizations: No    Attends Engineer, structural: Not on file    Marital Status: Never married  Intimate Partner Violence: Not At Risk (04/24/2022)   Received from [facility], [facility]   Humiliation, Afraid, Rape, and Kick questionnaire    Fear of Current or Ex-Partner: No    Emotionally Abused: No    Physically Abused: No    Sexually Abused: No    Outpatient Medications Prior to Visit  Medication Sig Dispense Refill   amitriptyline (ELAVIL) 50 MG tablet Take 50 mg by mouth at bedtime.     amphetamine-dextroamphetamine (ADDERALL XR) 10 MG 24 hr capsule Take 10 mg by mouth every morning.     ARIPiprazole (ABILIFY) 10 MG tablet Take 1 tablet (10 mg total) by mouth daily. (Patient taking differently: Take 15 mg by mouth daily.) 30 tablet 0   B-D TB SYRINGE .5CC/27GX1/2" 27G X 1/2" 0.5 ML MISC USE 1 EACH ONCE A WEEK FOR 84  DAYS     BD SAFETYGLIDE NEEDLE 18G X 1-1/2" MISC USE 1 EACH ONCE A WEEK FOR 84 DAYS     clonazePAM (KLONOPIN) 0.5 MG tablet Take 0.5 mg by mouth daily as needed.     cloNIDine (CATAPRES) 0.2 MG tablet Take 0.2 mg by mouth at bedtime.     gabapentin (NEURONTIN) 300 MG capsule Take  300 mg by mouth 3 (three) times daily.     ibuprofen (ADVIL) 200 MG tablet Take 800 mg by mouth every 6 (six) hours as needed (For leg pain).     levothyroxine (SYNTHROID) 25 MCG tablet Take 1 tablet (25 mcg total) by mouth daily. 90 tablet 1   metFORMIN (GLUCOPHAGE) 500 MG tablet Take 1 tablet (500 mg total) by mouth 2 (two) times daily with a meal. 180 tablet 3   nitrofurantoin, macrocrystal-monohydrate, (MACROBID) 100 MG capsule Take 1 capsule (100 mg total) by mouth 2 (two) times daily. 10 capsule 0   omeprazole (PRILOSEC) 40 MG capsule Take 1 capsule (40 mg total) by mouth daily. 30 capsule 11   ondansetron (ZOFRAN) 4 MG tablet Take 1 tablet (4 mg total) by mouth every 8 (eight) hours as needed for nausea or vomiting. 20 tablet 0   ondansetron (ZOFRAN-ODT) 4 MG disintegrating tablet Take 4 mg by mouth 2 (two) times daily.     predniSONE (DELTASONE) 10 MG tablet Take 4 tablets (40 mg total) by mouth daily with breakfast for 3 days, THEN 3 tablets (30 mg total) daily with breakfast for 3 days, THEN 2 tablets (20 mg total) daily with breakfast for 3 days, THEN 1 tablet (10 mg total) daily with breakfast for 3 days. 30 tablet 0   promethazine (PHENERGAN) 25 MG tablet Take 25 mg by mouth every 6 (six) hours as needed.     testosterone cypionate (DEPOTESTOSTERONE CYPIONATE) 200 MG/ML injection Inject into the skin. 0.3mg  once a week     topiramate (TOPAMAX) 25 MG tablet Take 25 mg by mouth 2 (two) times daily.     TUBERCULIN SYR 1CC/27GX1/2" 27G X 1/2" 1 ML MISC Use 1 each once a week for 84 days     Vitamin D, Ergocalciferol, (DRISDOL) 1.25 MG (50000 UNIT) CAPS capsule Take 1 capsule (50,000 Units total) by mouth every 7  (seven) days. 5 capsule 0   rosuvastatin (CRESTOR) 5 MG tablet Take 1 tablet (5 mg total) by mouth daily. 90 tablet 1   ARIPiprazole (ABILIFY) 15 MG tablet Take 15 mg by mouth at bedtime. (Patient not taking: Reported on 04/25/2023)     No facility-administered medications prior to visit.    Allergies  Allergen Reactions   Strawberry Extract Itching and Rash    ROS Review of Systems  Constitutional:  Negative for chills and fever.  Eyes:  Negative for visual disturbance.  Respiratory:  Negative for chest tightness and shortness of breath.   Neurological:  Negative for dizziness and headaches.      Objective:    Physical Exam HENT:     Head: Normocephalic.     Mouth/Throat:     Mouth: Mucous membranes are moist.  Cardiovascular:     Rate and Rhythm: Normal rate.     Heart sounds: Normal heart sounds.  Pulmonary:     Effort: Pulmonary effort is normal.     Breath sounds: Normal breath sounds.  Neurological:     Mental Status: He is alert.     BP (!) 139/97 (BP Location: Left Arm, Patient Position: Sitting, Cuff Size: Large)   Pulse (!) 107   Ht 5\' 3"  (1.6 m)   Wt 286 lb 1.9 oz (129.8 kg)   SpO2 95%   BMI 50.68 kg/m  Wt Readings from Last 3 Encounters:  04/25/23 286 lb 1.9 oz (129.8 kg)  04/24/23 281 lb (127.5 kg)  04/05/23 283 lb 3.2 oz (128.5 kg)  Lab Results  Component Value Date   TSH 6.080 (H) 02/28/2023   Lab Results  Component Value Date   WBC 12.3 (H) 04/24/2023   HGB 14.4 04/24/2023   HCT 45.2 (H) 04/24/2023   MCV 85.0 04/24/2023   PLT 320 04/24/2023   Lab Results  Component Value Date   NA 137 02/28/2023   K 4.4 02/28/2023   CO2 21 02/28/2023   GLUCOSE 85 02/28/2023   BUN 8 02/28/2023   CREATININE 1.19 (H) 02/28/2023   BILITOT 0.8 02/28/2023   ALKPHOS 83 02/28/2023   AST 27 02/28/2023   ALT 46 (H) 02/28/2023   PROT 7.9 02/28/2023   ALBUMIN 4.7 02/28/2023   CALCIUM 10.5 (H) 02/28/2023   ANIONGAP 8 12/20/2022   EGFR 62 02/28/2023    Lab Results  Component Value Date   CHOL 192 02/28/2023   Lab Results  Component Value Date   HDL 38 (L) 02/28/2023   Lab Results  Component Value Date   LDLCALC 125 (H) 02/28/2023   Lab Results  Component Value Date   TRIG 163 (H) 02/28/2023   Lab Results  Component Value Date   CHOLHDL 5.1 (H) 02/28/2023   Lab Results  Component Value Date   HGBA1C 5.6 02/28/2023      Assessment & Plan:  Morbid obesity (HCC) Assessment & Plan: Recommendations for Weight Loss Management: -Emphasize Lifestyle Changes: A heart-healthy diet and increased physical activity are crucial. -Healthy Tips for Weight Loss: -Increase Intake of Nutrient-Rich Foods: Prioritize fruits, vegetables, and whole grains. -Incorporate Lean Proteins: Include chicken, fish, beans, and legumes in your diet. -Choose Low-Fat Dairy Products: Opt for dairy products that are low in fat. -Reduce Unhealthy Fats: Limit saturated fats, trans fatty acids, and cholesterol. -Aim for Regular Physical Activity: Engage in at least 30 minutes of brisk walking or other physical activities on at least 5 days a week.   Orders: -     Wegovy; Inject 0.25 mg into the skin once a week.  Dispense: 2 mL; Refill: 0  Other hyperlipidemia -     Rosuvastatin Calcium; Take 1 tablet (10 mg total) by mouth daily.  Dispense: 90 tablet; Refill: 3  Note: This chart has been completed using Engineer, civil (consulting) software, and while attempts have been made to ensure accuracy, certain words and phrases may not be transcribed as intended.    Follow-up: Return in about 1 month (around 05/25/2023).   Gilmore Laroche, FNP

## 2023-04-25 NOTE — Assessment & Plan Note (Signed)
Recommendations for Weight Loss Management:  Emphasize Lifestyle Changes: A heart-healthy diet and increased physical activity are crucial. Healthy Tips for Weight Loss: Increase Intake of Nutrient-Rich Foods: Prioritize fruits, vegetables, and whole grains. Incorporate Lean Proteins: Include chicken, fish, beans, and legumes in your diet. Choose Low-Fat Dairy Products: Opt for dairy products that are low in fat. Reduce Unhealthy Fats: Limit saturated fats, trans fatty acids, and cholesterol. Aim for Regular Physical Activity: Engage in at least 30 minutes of brisk walking or other physical activities on at least 5 days a week.

## 2023-04-25 NOTE — Progress Notes (Signed)
White blood cell count is elevated at 12.3 with 10,600 neutrophils.  Sedimentation rate is increased at 38.  Ferritin remains normal at 23 though.  There is definitely evidence of some increased inflammation I am not certain about a true stills disease relapse.  I will send in a prescription for a 12-day prednisone taper to try initially and see if this resolves symptoms or if they come right back. The hip x-ray shows moderately severe osteoarthritis involving both hip joints.  I agree with referring to orthopedic surgery clinic for evaluation and please order this.

## 2023-04-25 NOTE — Progress Notes (Signed)
CRP is also very elevated up to 34.3. This was 1 last year when not having symptoms so definitely a change from baseline. Same plan as above for a flare.

## 2023-04-25 NOTE — Patient Instructions (Addendum)
I appreciate the opportunity to provide care to you today!    Follow up:  1 month   Obesity Management Plan -Medication: Start taking Wegovy 0.25 mg weekly. -Refill Request: Please request a monthly refill for dose adjustment as needed. Recommendations for Weight Loss Management: Emphasize Lifestyle Changes: A heart-healthy diet and increased physical activity are crucial. Healthy Tips for Weight Loss: Increase Intake of Nutrient-Rich Foods: Prioritize fruits, vegetables, and whole grains. Incorporate Lean Proteins: Include chicken, fish, beans, and legumes in your diet. Choose Low-Fat Dairy Products: Opt for dairy products that are low in fat. Reduce Unhealthy Fats: Limit saturated fats, trans fatty acids, and cholesterol. Aim for Regular Physical Activity: Engage in at least 30 minutes of brisk walking or other physical activities on at least 5 days a week.     Please continue to a heart-healthy diet and increase your physical activities. Try to exercise for at least five days a week.    It was a pleasure to see you and I look forward to continuing to work together on your health and well-being. Please do not hesitate to call the office if you need care or have questions about your care.  In case of emergency, please visit the Emergency Department for urgent care, or contact our clinic at [phone] to schedule an appointment. We're here to help you!   Have a wonderful day and week. With Gratitude, Gilmore Laroche MSN, FNP-BC

## 2023-04-25 NOTE — Progress Notes (Signed)
Recommend we wait for now.  If symptoms resolve with course of steroid treatment I would want to hold off on resuming a long-term maintenance drug for right now.  If symptoms start to come back after finishing the prednisone or do not clear up much without his resorting to a much higher dose that I would start the process for resuming Humira.

## 2023-04-26 ENCOUNTER — Encounter (HOSPITAL_COMMUNITY): Payer: Medicare Other

## 2023-04-27 LAB — QUANTIFERON-TB GOLD PLUS
Mitogen-NIL: 2.63 [IU]/mL
NIL: 0.04 [IU]/mL
QuantiFERON-TB Gold Plus: NEGATIVE
TB1-NIL: <0 [IU]/mL
TB2-NIL: <0 [IU]/mL

## 2023-04-30 ENCOUNTER — Telehealth: Payer: Self-pay | Admitting: Internal Medicine

## 2023-04-30 NOTE — Telephone Encounter (Signed)
Attempted to contact the patient and left a message to call the office back regarding the xrays. Was going to advise patient that we can make a CD and the patient can pick it up from the office.

## 2023-04-30 NOTE — Telephone Encounter (Signed)
Advised patient we are unable to send the x-rays through my chart. Advised patient we have burnt a CD with the x-rays on it and it's at the front desk ready for pick up. Patient states will pick up on Thursday.

## 2023-04-30 NOTE — Telephone Encounter (Signed)
Pt called asking if they could have a copy of their x-rays on Mychart.

## 2023-05-01 ENCOUNTER — Ambulatory Visit (HOSPITAL_COMMUNITY): Payer: 59 | Attending: Family Medicine

## 2023-05-01 DIAGNOSIS — M25552 Pain in left hip: Secondary | ICD-10-CM | POA: Diagnosis present

## 2023-05-01 DIAGNOSIS — M25551 Pain in right hip: Secondary | ICD-10-CM | POA: Diagnosis present

## 2023-05-01 NOTE — Therapy (Signed)
OUTPATIENT PHYSICAL THERAPY LOWER EXTREMITY TREATMENT   Patient Name: Finnlee Guarnieri Glatfelter MRN: [id number] DOB:12-20-1990, 32 y.o., adult Today's Date: 05/01/2023  END OF SESSION:  PT End of Session - 05/01/23 1105     Visit Number 3    Number of Visits 16    Date for PT Re-Evaluation 05/24/23    Authorization Type Medicare and Medicaid    Progress Note Due on Visit 8    PT Start Time 1100    PT Stop Time 1140    PT Time Calculation (min) 40 min    Activity Tolerance Patient limited by pain    Behavior During Therapy WFL for tasks assessed/performed            Past Medical History:  Diagnosis Date   ADHD    Anxiety attack    panic attacks   Back pain    Bipolar disorder (HCC)    Chest pain    Constipation    Depression    Drug use    Food allergy    Gallbladder problem    GERD (gastroesophageal reflux disease)    Headache    High blood pressure    History of heart attack    History of stomach ulcers    based on symptoms, around age 57   HLD (hyperlipidemia)    Hypothyroidism    Joint pain    Myocardial infarction [facility]) 2013   age 76- patient reports it was stopped with nitroglycerine   Osteoarthritis    Pericarditis    RA (rheumatoid arthritis) (HCC)    Sleep apnea    SOB (shortness of breath)    Still disease, 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HCC)    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HCC)    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HCC)    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HCC)    Swallowing difficulty    Vitamin D deficiency    Past Surgical History:  Procedure Laterality Date   BALLOON DILATION N/A 12/22/2022   Procedure: BALLOON DILATION;  Surgeon: Lanelle Bal, DO;  Location: [facility];  Service: Endoscopy;  Laterality: N/A;   CHOLECYSTECTOMY N/A 08/29/2021   Procedure: LAPAROSCOPIC CHOLECYSTECTOMY WITH ICG;  Surgeon: Andria Meuse, MD;  Location: [facility];  Service: General;  Laterality: N/A;   ESOPHAGOGASTRODUODENOSCOPY (EGD) WITH PROPOFOL N/A 12/22/2022   Procedure: ESOPHAGOGASTRODUODENOSCOPY (EGD) WITH  PROPOFOL;  Surgeon: Lanelle Bal, DO;  Location: [facility];  Service: Endoscopy;  Laterality: N/A;  1:30 pm, asa 3   WISDOM TOOTH EXTRACTION     Patient Active Problem List   Diagnosis Date Noted   Chronic hip pain, bilateral 03/02/2023   BMI 50.0-59.9, adult (HCC) 09/19/2022   OSA on CPAP 08/30/2022   Other specified hypothyroidism 08/22/2022   Elevated cholesterol 08/22/2022   Insulin resistance 07/18/2022   History of tachycardia 06/06/2022   Elevated ALT measurement 06/06/2022   SOB (shortness of breath) on exertion 05/23/2022   Health care maintenance 05/23/2022   Metabolic syndrome 05/23/2022   Vitamin B12 deficiency 05/23/2022   Acquired hypothyroidism 05/09/2022   Mixed hyperlipidemia 05/09/2022   Low HDL (under 40) 05/09/2022   Elevated glucose 05/09/2022   Eating disorder 05/09/2022   Other fatigue 05/09/2022   Class 3 severe obesity with serious comorbidity and body mass index (BMI) of 45.0 to 49.9 in adult (HCC) 05/09/2022   Lumbar degenerative disc disease 03/13/2022   Bipolar depression (HCC) 03/06/2022   Chronic bilateral low back pain with right-sided sciatica 02/15/2022   Vitamin D deficiency 01/04/2022   Loud  snoring 01/04/2022   GAD (generalized anxiety disorder) 01/04/2022   Marijuana use 01/04/2022   Chest pain 12/25/2021   Transaminitis 12/25/2021   GERD without esophagitis 12/25/2021   Elevated lipase 12/25/2021   Compulsive skin picking 07/28/2020   Social phobia 07/28/2020   Bipolar I disorder, current or most recent episode depressed, in partial remission (HCC) 07/19/2020   Bilateral primary osteoarthritis of hip 07/19/2020   Allergic rhinitis due to pollen 05/12/2020   MDD (major depressive disorder), recurrent episode, severe (HCC) 05/02/2019   High risk medication use 05/03/2017   Tobacco use 05/29/2016   Arthralgia of multiple sites, bilateral 03/06/2016   Morbid obesity (HCC) 11/05/2014   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 Patient was just started with Prednisone by his rheumatologist and feels a little better now. Felt better after the last PT session. Currently reports of hip pain = 4/10.   EVAL: Arrives to the clinic with c/o B hip pain and R knee pain (see below). Also reports of R knee giving out. Condition on the hips started 10 years ago when she walked for prolonged periods. Patient went to the ER and had X-ray and was diagnosed with OA. Since then, pain just gradually built up (started as intermittent but now it's constant). Patient had hip injections which only helped a little while PT in the past did not help at all. Patient denies trauma to the hip. Due to worsening hip pain, patient was then referred to outpatient PT evaluation and management in preparation for a planned B hip replacement.  PERTINENT HISTORY: Bulging disc on the lumbar spine PAIN:  Are you having pain? Yes: NPRS scale: 5/10 Pain location: B hip and lateral side R knee Pain description: constant nagging/throbbing Aggravating factors: walking for 2 min (8-9/10) Relieving factors: laying down (3/10)  PRECAUTIONS: None  RED FLAGS: None   WEIGHT BEARING RESTRICTIONS: No  FALLS:  Has patient fallen in last 6 months? No  LIVING ENVIRONMENT: Lives with: lives alone Lives in: House/apartment (1st floor) Stairs: No Has following equipment at home: Environmental consultant - 4 wheeled  OCCUPATION: on  disability  PLOF: Independent and Independent with basic ADLs  PATIENT GOALS: "to get a hip surgery/replacement"  NEXT MD VISIT: First week of 04/2023  OBJECTIVE:   DIAGNOSTIC FINDINGS: No recent imaging to date  PATIENT SURVEYS:  FOTO [id number]  COGNITION: Overall cognitive status: Within functional limits for tasks assessed    MUSCLE LENGTH: Hamstrings: Right moderate restriction Left moderate restriction Maisie Fus test: Right moderate restriction; Left moderate restriction  POSTURE:  True leg length measurement: L = 77 cm, R = 80 cm Standing: Genu valgum L, slightly flexed R knee, R iliac crest higher  PALPATION: No tenderness noted on major bony landmarks of B hips  LOWER EXTREMITY ROM:  Active ROM Right eval Left eval  Hip flexion 60 40  Hip extension [facility]  Hip abduction 10 10  Hip adduction    Hip internal rotation 15 10  Hip external  rotation 40 30  Knee flexion [facility] WFL  Knee extension [facility]  Ankle dorsiflexion [facility]  Ankle plantarflexion [facility] WFL  Ankle inversion    Ankle eversion     (Blank rows = not tested)  LOWER EXTREMITY MMT:  MMT Right eval Left eval  Hip flexion 4 4  Hip extension 4- 4-  Hip abduction 2+ 2+  Hip adduction    Hip internal rotation    Hip external rotation    Knee flexion 5 5  Knee extension 4+ 5  Ankle dorsiflexion 5 5  Ankle plantarflexion 5 5  Ankle inversion    Ankle eversion     (Blank rows = not tested)  TRUNK FLEX MMT: 3/5  LOWER EXTREMITY SPECIAL TESTS:  Hip special tests: Luisa Hart (FABER) test: positive , Thomas test: positive , Ely's test: positive , and Piriformis test: positive all test on bilateral Supine-to-sit test did not shorten the R LE  FUNCTIONAL TESTS:  5 times sit to stand: 11.68 sec 2 minute walk test: 419 ft  GAIT: Distance walked: 419 ft Assistive device utilized: None Level of assistance: Complete Independence Comments: decreased step length on B, Trendelenburg gait pattern,  decreased swing phase on B, narrow BOS   TODAY'S TREATMENT:                                                                                                                              DATE:  05/01/23 Seated hamstring stretch x 30" x 3 Grade 2 inf glide x 30" x 3 on each hip Supine MWM on L hip flexion (with hip distraction) x 10 x 2 Bridging x 3" x 10 x 2 Single knee-to-chest x 15" x 3 Heel slides x 10 x 2  04/05/23 Gait training with SPC on R with 2-point gait pattern and SBA ~ 650-700 ft Seated hamstring stretch x 30" x 3 Grade 2 inf glide x 30" x 3 on each hip Supine MWM on L hip flexion (with hip distraction) x 10 x 2 Bridging x 10 x 2 Hooklying hip adduction squeeze with a pillow x 3" x 10 x 2  Hooklying hip abd isometrics with a belt x 3" x 10 x 2 Heel slides x 10 x 2  03/29/23 Evaluation and patient education   PATIENT EDUCATION:  Education details: Written HEP provided and reviewed. Educated on the importance of using AD during flare ups. Person educated: Patient Education method: Explanation, Demonstration, Verbal cues, and Handouts Education comprehension: verbalized understanding and returned demonstration  HOME EXERCISE PROGRAM: Access Code: [email or web address] URL: [email or web address] Date: 05/01/2023 Prepared by: Krystal Clark  Exercises - Hooklying Single Knee to Chest Stretch with Towel  - 1-2 x daily - 7 x weekly - 5 reps - 15 hold  Access Code: [email or web address] URL: [email or web address] Date: 04/05/2023 Prepared by: Krystal Clark  Exercises - Supine Bridge  - 1-2 x daily - 7 x weekly - 2 sets - 10 reps - Supine  Hip Adduction Isometric with Ball  - 1-2 x daily - 7 x weekly - 2 sets - 10 reps - 3 hold - Hooklying Isometric Hip Abduction with Belt  - 1-2 x daily - 7 x weekly - 2 sets - 10 reps - 3 hold - Supine Heel Slide  - 1-2 x daily - 7 x weekly - 2 sets - 10 reps  ASSESSMENT:  CLINICAL IMPRESSION: Interventions today were geared  towards hip strengthening, mobility and gait training. Reported of a decrease in pain to 1/10 with MWM, hip abd isometrics, and inf glides. Attempted to perform MWM on the R hip but pain increased to 5/10 on the first two repetitions so procedure was terminated. Patient reported of some cramps on the R post thigh which went away after the hamstring stretch. Demonstrated appropriate levels of fatigue. Pacing of activity is slower today. Provided mild amount of cueing to ensure correct execution of activity with good carry-over. To date, skilled PT is required to address the impairments and improve function.  EVAL: Patient is a 32 y.o. female who was seen today for physical therapy evaluation and treatment for B hip pain. Patient was diagnosed with B hip pain by referring provider further defined by difficulty with walking due to pain, weakness, and decreased soft tissue extensibility. Skilled PT is required to address the impairments and functional limitations listed below. Patient may also benefit from a shoe lift to further address true leg length discrepancy.  OBJECTIVE IMPAIRMENTS: Abnormal gait, decreased activity tolerance, decreased balance, decreased mobility, difficulty walking, decreased ROM, decreased strength, hypomobility, impaired flexibility, and pain.   ACTIVITY LIMITATIONS: lifting, bending, sitting, standing, and stairs  PARTICIPATION LIMITATIONS: cleaning, laundry, driving, shopping, community activity, and occupation  PERSONAL FACTORS: Time since onset of injury/illness/exacerbation and 1-2 comorbidities: low back pain and knee pain  are also affecting patient's functional outcome.   REHAB POTENTIAL: Fair    CLINICAL DECISION MAKING: Evolving/moderate complexity  EVALUATION COMPLEXITY: Moderate   GOALS: Goals reviewed with patient? Yes  SHORT TERM GOALS: Target date: 04/26/23 Pt will demonstrate indep in HEP to facilitate carry-over of skilled services and improve functional  outcomes Goal status: INITIAL  LONG TERM GOALS: Target date: 05/24/23  Pt will decrease 5TSTS by at least 3 seconds in order to demonstrate clinically significant improvement in LE strength Baseline: 11.68 sec Goal status: INITIAL  2.  Pt will increase by at least 40 ft in order to demonstrate clinically significant improvement in community ambulation  Baseline: 419 ft Goal status: INITIAL  3.  Pt will increase FOTO to at least 76 in order to demonstrate significant improvement in function related to ambulation Baseline: 26 Goal status: INITIAL  4.  Patient will demonstrate increase in hip flex and abd ROM by 10 degrees to facilitate ease in ambulation  Baseline: hip abd 10 degrees, hip flex = 40-60 degrees Goal status: INITIAL  5.  Pt will demonstrate increase in LE strength to 4/5 to facilitate ease and safety in ambulation Baseline: 2+/5 Goal status: INITIAL  6.  Patient will be able to ambulate > 200 ft independently on level surface with min pain (3-4/10) and difficulty Baseline: 5/10 on ambulation Goal status: INITIAL   PLAN:  PT FREQUENCY: 2x/week  PT DURATION: 8 weeks  PLANNED INTERVENTIONS: Therapeutic exercises, Therapeutic activity, Neuromuscular re-education, Balance training, Gait training, Patient/Family education, Self Care, and Joint mobilization  PLAN FOR NEXT SESSION: Continue POC and may progress as tolerated with emphasis on LE strengthening, flexibility and  mobility exercises as well as core strengthening. Refer to PCP for a possible prescription of a shoe lift   Iantha Fallen L. Kelseigh Diver, PT, DPT, OCS Board-Certified Clinical Specialist in Orthopedic PT PT Compact Privilege # ([place]): [id number] T 05/01/2023, 11:28 AM

## 2023-05-03 ENCOUNTER — Ambulatory Visit (INDEPENDENT_AMBULATORY_CARE_PROVIDER_SITE_OTHER): Payer: 59 | Admitting: Orthopaedic Surgery

## 2023-05-03 ENCOUNTER — Encounter: Payer: Self-pay | Admitting: Orthopaedic Surgery

## 2023-05-03 ENCOUNTER — Encounter (HOSPITAL_COMMUNITY): Payer: Medicare Other

## 2023-05-03 VITALS — Ht 63.5 in | Wt 286.0 lb

## 2023-05-03 DIAGNOSIS — M87052 Idiopathic aseptic necrosis of left femur: Secondary | ICD-10-CM

## 2023-05-03 DIAGNOSIS — Z6841 Body Mass Index (BMI) 40.0 and over, adult: Secondary | ICD-10-CM

## 2023-05-03 DIAGNOSIS — M87051 Idiopathic aseptic necrosis of right femur: Secondary | ICD-10-CM

## 2023-05-03 NOTE — Progress Notes (Signed)
Office Visit Note   Patient: [name]           Date of Birth: Nov 30, 1990           MRN: [id number] Visit Date: 05/03/2023              Requested by: Kathryn Plan, MD [address] [place],  [place] [address] PCP: Kathryn Laroche, FNP   Assessment & [name]: Visit Diagnoses:  1. Avascular necrosis of bone of hip, left (HCC)   2. Avascular necrosis of bone of hip, right (HCC)   3. 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  Ultimately she would benefit from arthroplasty however her BMI is nearly 50 and not a surgical candidate due to excessive risk.  She will work with her PCP to get her BMI down to acceptable level of 40 or less.  In the meantime continue conservative management.  The patient meets the AMA guidelines for Morbid (severe) obesity with a BMI > 40.0 and I have recommended weight loss.   Follow-Up Instructions: No follow-ups on file.   Orders:  No orders of the defined types were placed in this encounter.  No orders of the defined types were placed in this encounter.     Procedures: No procedures performed   Clinical Data: No additional findings.   Subjective: Chief Complaint  Patient presents with   Right Hip - Pain   Left Hip - Pain    HPI [name] is a 32 year old patient who comes in for bilateral hip pain worse on the right.  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  Unfortunately he has developed avascular necrosis and DJD as a result.  He has been told that he needs hip replacements.  He has seen other orthopedic surgeons for surgical consultation but was told that he needed a BMI of less than 40. Review of Systems  Constitutional: Negative.   HENT: Negative.    Eyes: Negative.   Respiratory: Negative.    Cardiovascular: Negative.   Gastrointestinal: Negative.   Endocrine:  Negative.   Genitourinary: Negative.   Skin: Negative.   Allergic/Immunologic: Negative.   Neurological: Negative.   Hematological: Negative.   Psychiatric/Behavioral: Negative.    All other systems reviewed and are negative.    Objective: Vital Signs: Ht 5' 3.5" (1.613 m)   Wt 286 lb (129.7 kg)   BMI 49.87 kg/m   Physical Exam Vitals and nursing note reviewed.  Constitutional:      Appearance: He is well-developed.  HENT:     Head: Normocephalic and atraumatic.  Eyes:     Pupils: Pupils are equal, round, and reactive to light.  Pulmonary:     Effort: Pulmonary effort is normal.  Abdominal:     Palpations: Abdomen is soft.  Musculoskeletal:        General: Normal range of motion.     Cervical back: Neck supple.  Skin:    General: Skin is warm.  Neurological:     Mental Status: He is alert and oriented to person, place, and time.  Psychiatric:        Behavior: Behavior normal.        Thought Content: Thought content normal.        Judgment: Judgment normal.     Ortho Exam Exam of bilateral hip shows severe pain with  movement of the hip joint.  Antalgic gait. Specialty Comments:  No specialty comments available.  Imaging: No results found.   PMFS History: Patient Active Problem List   Diagnosis Date Noted   Chronic hip pain, bilateral 03/02/2023   BMI 50.0-59.9, adult (HCC) 09/19/2022   OSA on CPAP 08/30/2022   Other specified hypothyroidism 08/22/2022   Elevated cholesterol 08/22/2022   Insulin resistance 07/18/2022   History of tachycardia 06/06/2022   Elevated ALT measurement 06/06/2022   SOB (shortness of breath) on exertion 05/23/2022   Health care maintenance 05/23/2022   Metabolic syndrome 05/23/2022   Vitamin B12 deficiency 05/23/2022   Acquired hypothyroidism 05/09/2022   Mixed hyperlipidemia 05/09/2022   Low HDL (under 40) 05/09/2022   Elevated glucose 05/09/2022   Eating disorder 05/09/2022   Other fatigue 05/09/2022   Class 3 severe  obesity with serious comorbidity and body mass index (BMI) of 45.0 to 49.9 in adult (HCC) 05/09/2022   Lumbar degenerative disc disease 03/13/2022   Bipolar depression (HCC) 03/06/2022   Chronic bilateral low back pain with right-sided sciatica 02/15/2022   Vitamin D deficiency 01/04/2022   Loud snoring 01/04/2022   GAD (generalized anxiety disorder) 01/04/2022   Marijuana use 01/04/2022   Chest pain 12/25/2021   Transaminitis 12/25/2021   GERD without esophagitis 12/25/2021   Elevated lipase 12/25/2021   Compulsive skin picking 07/28/2020   Social phobia 07/28/2020   Bipolar I disorder, current or most recent episode depressed, in partial remission (HCC) 07/19/2020   Bilateral primary osteoarthritis of hip 07/19/2020   Allergic rhinitis due to pollen 05/12/2020   MDD (major depressive disorder), recurrent episode, severe (HCC) 05/02/2019   High risk medication use 05/03/2017   Tobacco use 05/29/2016   Arthralgia of multiple sites, bilateral 03/06/2016   Morbid obesity (HCC) 11/05/2014   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   Insomnia 02/18/2014   Bipolar affective disorder, currently depressed, moderate (HCC) 05/25/2011   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   Past Medical History:  Diagnosis Date   ADHD    Anxiety attack    panic attacks   Back pain    Bipolar disorder (HCC)    Chest pain    Constipation    Depression    Drug use    Food allergy    Gallbladder problem    GERD (gastroesophageal reflux disease)    Headache    High blood pressure    History of heart attack    History of stomach ulcers    based on symptoms, around age 81   HLD (hyperlipidemia)    Hypothyroidism    Joint pain    Myocardial infarction [facility]) 2013   age 52- patient reports it was stopped with nitroglycerine   Osteoarthritis    Pericarditis    RA (rheumatoid arthritis) (HCC)    Sleep apnea    SOB (shortness of breath)    Still disease, 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HCC)    Still's disease  (HCC)    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HCC)    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HCC)    Swallowing difficulty    Vitamin D deficiency     Family History  Problem Relation Age of Onset   Heart disease Mother    Heart attack Mother    High blood pressure Mother    Sudden death Mother    Depression Father    Rheum arthritis Father    Hypertension Father    Bipolar disorder Brother    Colon cancer Neg Hx     Past  Surgical History:  Procedure Laterality Date   BALLOON DILATION N/A 12/22/2022   Procedure: BALLOON DILATION;  Surgeon: Lanelle Bal, DO;  Location: [facility];  Service: Endoscopy;  Laterality: N/A;   CHOLECYSTECTOMY N/A 08/29/2021   Procedure: LAPAROSCOPIC CHOLECYSTECTOMY WITH ICG;  Surgeon: Andria Meuse, MD;  Location: [facility];  Service: General;  Laterality: N/A;   ESOPHAGOGASTRODUODENOSCOPY (EGD) WITH PROPOFOL N/A 12/22/2022   Procedure: ESOPHAGOGASTRODUODENOSCOPY (EGD) WITH PROPOFOL;  Surgeon: Lanelle Bal, DO;  Location: [facility];  Service: Endoscopy;  Laterality: N/A;  1:30 pm, asa 3   WISDOM TOOTH EXTRACTION     Social History   Occupational History   Not on file  Tobacco Use   Smoking status: Former    Current packs/day: 0.00    Average packs/day: 1 pack/day for 10.4 years (10.4 ttl pk-yrs)    Types: Cigarettes, E-cigarettes    Start date: 07/27/2009    Quit date: 12/2019    Years since quitting: 3.3    Passive exposure: Current   Smokeless tobacco: Never  Vaping Use   Vaping status: Every Day   Substances: Nicotine, Flavoring  Substance and Sexual Activity   Alcohol use: Not Currently   Drug use: Not Currently    Types: Marijuana    Comment: daily   Sexual activity: Yes    Partners: Female    Birth control/protection: None

## 2023-05-08 ENCOUNTER — Telehealth (HOSPITAL_COMMUNITY): Payer: Self-pay

## 2023-05-08 ENCOUNTER — Encounter (HOSPITAL_COMMUNITY): Payer: Medicare Other

## 2023-05-08 NOTE — Telephone Encounter (Signed)
Called patient today for her 3rd no show. Patient did not answer and left a voice message. Informed patient about the facility's policies on cancellation/no shows. Patient was informed  to call us back to discuss the possibility of being discharged from PT.  Tish Frederickson. Gerilyn Stargell, PT, DPT, OCS Board-Certified Clinical Specialist in Orthopedic PT PT Compact Privilege # ([place]): [phone] T

## 2023-05-10 ENCOUNTER — Encounter (HOSPITAL_COMMUNITY): Payer: Medicare Other

## 2023-05-10 ENCOUNTER — Telehealth (HOSPITAL_COMMUNITY): Payer: Self-pay

## 2023-05-10 ENCOUNTER — Encounter (HOSPITAL_COMMUNITY): Payer: Self-pay

## 2023-05-10 NOTE — Therapy (Signed)
PHYSICAL THERAPY DISCHARGE SUMMARY  Visits from Start of Care: 3  Current functional level related to goals / functional outcomes: Not assessed due to not returning since the last visit   Remaining deficits: Not assessed due to not returning since the last visit   Education / Equipment: Not assessed due to not returning since the last visit   Patient agrees to discharge. Patient goals were not assessed due to not returning since the last visit. Patient is being discharged due to not returning since the last visit.   [name]. [name], PT, DPT, OCS Board-Certified Clinical Specialist in Orthopedic PT PT Compact Privilege # ([place]): [phone] T

## 2023-05-10 NOTE — Telephone Encounter (Signed)
Called patient today for his 4th no show. Patient did not answer and left a voice message. Informed patient about the facility's policies on cancellation/no shows. Because it is his 4th no show, patient is now d/c from PT. Informed patient that he will need a new referral if he wants to come back to PT and that he can call if he has questions.  Tish Frederickson. Zyquan Crotty, PT, DPT, OCS Board-Certified Clinical Specialist in Orthopedic PT PT Compact Privilege # ([place]): [phone] T

## 2023-05-15 ENCOUNTER — Encounter (HOSPITAL_COMMUNITY): Payer: Medicare Other

## 2023-05-17 ENCOUNTER — Encounter (HOSPITAL_COMMUNITY): Payer: Medicare Other

## 2023-05-22 ENCOUNTER — Encounter (HOSPITAL_COMMUNITY): Payer: Medicare Other

## 2023-05-22 ENCOUNTER — Ambulatory Visit: Payer: Medicare Other | Admitting: Nutrition

## 2023-05-24 ENCOUNTER — Encounter (HOSPITAL_COMMUNITY): Payer: Medicare Other

## 2023-06-06 ENCOUNTER — Telehealth: Payer: Self-pay | Admitting: Family Medicine

## 2023-06-09 NOTE — Telephone Encounter (Signed)
What is the name of the prescription?

## 2023-07-06 ENCOUNTER — Telehealth: Payer: Self-pay

## 2023-07-06 ENCOUNTER — Other Ambulatory Visit: Payer: Self-pay

## 2023-07-06 DIAGNOSIS — K2970 Gastritis, unspecified, without bleeding: Secondary | ICD-10-CM

## 2023-07-06 MED ORDER — OMEPRAZOLE 40 MG PO CPDR
40.0000 mg | DELAYED_RELEASE_CAPSULE | Freq: Every day | ORAL | 11 refills | Status: DC
Start: 2023-07-06 — End: 2023-11-08

## 2023-07-06 NOTE — Telephone Encounter (Signed)
Copied from CRM [id number]. Topic: Clinical - Medical Advice >> Jul 06, 2023  9:59 AM Clayton Bibles wrote: Reason for CRM: PT would like a referral to [facility]. She is a former PT of [facility]. Please message in My Chart

## 2023-07-06 NOTE — Telephone Encounter (Signed)
Refill sent.

## 2023-07-06 NOTE — Telephone Encounter (Signed)
Copied from CRM [id number]. Topic: Clinical - Medication Refill >> Jul 06, 2023  9:56 AM Clayton Bibles wrote: Most Recent Primary Care Visit:  Provider: Gilmore Laroche  Department: [facility]  Visit Type: OFFICE VISIT  Date: 04/25/2023  Medication: Omeprazole - Second Request   Has the patient contacted their pharmacy? Yes (Agent: If no, request that the patient contact the pharmacy for the refill. If patient does not wish to contact the pharmacy document the reason why and proceed with request.) (Agent: If yes, when and what did the pharmacy advise?)  Is this the correct pharmacy for this prescription? Yes If no, delete pharmacy and type the correct one.  This is the patient's preferred pharmacy:  [facility] [phone] - [place], [place] - [address] AT [facility] [address] [place] [address] Phone: [phone] Fax: [phone]   Has the prescription been filled recently? No  Is the patient out of the medication? Yes   Has the patient been seen for an appointment in the last year OR does the patient have an upcoming appointment? Yes  Can we respond through MyChart? Yes   Agent: Please be advised that Rx refills may take up to 3 business days. We ask that you follow-up with your pharmacy.

## 2023-07-11 ENCOUNTER — Ambulatory Visit: Payer: 59 | Admitting: Nutrition

## 2023-07-30 ENCOUNTER — Ambulatory Visit: Payer: Medicare Other | Admitting: Family Medicine

## 2023-08-19 ENCOUNTER — Telehealth: Payer: 59

## 2023-08-19 ENCOUNTER — Telehealth: Payer: 59 | Admitting: Physician Assistant

## 2023-08-19 DIAGNOSIS — J069 Acute upper respiratory infection, unspecified: Secondary | ICD-10-CM

## 2023-08-19 MED ORDER — FLUTICASONE PROPIONATE 50 MCG/ACT NA SUSP: 2.0000 | Freq: Every day | NASAL | 6 refills | Status: AC

## 2023-08-19 MED ORDER — BENZONATATE 100 MG PO CAPS: 100.0000 mg | ORAL_CAPSULE | Freq: Two times a day (BID) | ORAL | 0 refills | Status: DC | PRN

## 2023-08-19 MED ORDER — CETIRIZINE HCL 10 MG PO TABS: 10.0000 mg | ORAL_TABLET | Freq: Every day | ORAL | 0 refills | Status: DC

## 2023-08-19 NOTE — Progress Notes (Signed)
I have spent 5 minutes in review of e-visit questionnaire, review and updating patient chart, medical decision making and response to patient.   Laure Kidney, PA-C

## 2023-08-19 NOTE — Progress Notes (Signed)

## 2023-08-22 ENCOUNTER — Telehealth: Payer: 59

## 2023-08-22 ENCOUNTER — Encounter: Payer: Self-pay | Admitting: Nurse Practitioner

## 2023-08-23 ENCOUNTER — Emergency Department (HOSPITAL_COMMUNITY)
Admission: EM | Admit: 2023-08-23 | Discharge: 2023-08-24 | Disposition: A | Discharge status: Home / Self Care | Payer: 59 | Attending: Emergency Medicine | Admitting: Emergency Medicine

## 2023-08-23 ENCOUNTER — Encounter (HOSPITAL_COMMUNITY): Payer: Self-pay

## 2023-08-23 ENCOUNTER — Other Ambulatory Visit: Payer: Self-pay

## 2023-08-23 DIAGNOSIS — J069 Acute upper respiratory infection, unspecified: Secondary | ICD-10-CM | POA: Insufficient documentation

## 2023-08-23 DIAGNOSIS — H9201 Otalgia, right ear: Secondary | ICD-10-CM | POA: Diagnosis present

## 2023-08-23 NOTE — ED Triage Notes (Signed)
Pov from home . Cc of right ear ache since yesterday Went to [facility] for [facility]- rec'd steroids. Gotten worse.

## 2023-08-24 DIAGNOSIS — H9201 Otalgia, right ear: Secondary | ICD-10-CM | POA: Diagnosis not present

## 2023-08-24 MED ORDER — AMOXICILLIN 500 MG PO CAPS: 1000.0000 mg | ORAL_CAPSULE | Freq: Two times a day (BID) | ORAL | 0 refills | Status: DC

## 2023-08-24 MED ORDER — PSEUDOEPHEDRINE HCL 60 MG PO TABS
60.0000 mg | ORAL_TABLET | Freq: Once | ORAL | Status: AC
  Administered 2023-08-24: 60 mg via ORAL
  Filled 2023-08-24: qty 1

## 2023-08-24 MED ORDER — OXYCODONE-ACETAMINOPHEN 5-325 MG PO TABS
1.0000 | ORAL_TABLET | Freq: Once | ORAL | Status: AC
  Administered 2023-08-24: 1 via ORAL
  Filled 2023-08-24: qty 1

## 2023-08-24 MED ORDER — AMOXICILLIN 250 MG PO CAPS
1000.0000 mg | ORAL_CAPSULE | Freq: Once | ORAL | Status: AC
  Administered 2023-08-24: 1000 mg via ORAL
  Filled 2023-08-24: qty 4

## 2023-08-24 MED ORDER — IBUPROFEN 800 MG PO TABS: 800.0000 mg | ORAL_TABLET | Freq: Four times a day (QID) | ORAL | 0 refills | Status: DC | PRN

## 2023-08-24 NOTE — ED Provider Notes (Signed)
[place] EMERGENCY DEPARTMENT AT [facility] Provider Note   CSN: [id number] Arrival date & time: 08/23/23  2313     History  Chief Complaint  Patient presents with   Otalgia    [name] is a 33 y.o. adult.  Patient presents to the emergency department for evaluation of URI symptoms with severe right ear pain.  Patient has been experiencing symptoms for more than a week.  She was seen at urgent care and diagnosed with uvulitis, given a course of prednisone.  Patient reports that the right ear has progressively worsened, now severe with decreased hearing.       Home Medications Prior to Admission medications   Medication Sig Start Date End Date Taking? Authorizing Provider  amoxicillin (AMOXIL) 500 MG capsule Take 2 capsules (1,000 mg total) by mouth 2 (two) times daily. 08/24/23  Yes Arron Mcnaught, Canary Brim, MD  ibuprofen (ADVIL) 800 MG tablet Take 1 tablet (800 mg total) by mouth every 6 (six) hours as needed for moderate pain (pain score 4-6). 08/24/23  Yes Grasiela Jonsson, Canary Brim, MD  amitriptyline (ELAVIL) 50 MG tablet Take 50 mg by mouth at bedtime.    [provider]  amphetamine-dextroamphetamine (ADDERALL XR) 10 MG 24 hr capsule Take 10 mg by mouth every morning. 03/26/23   [provider]  ARIPiprazole (ABILIFY) 10 MG tablet Take 1 tablet (10 mg total) by mouth daily. Patient taking differently: Take 15 mg by mouth daily. 03/09/22   Roselle Locus, MD  ARIPiprazole (ABILIFY) 15 MG tablet Take 15 mg by mouth at bedtime. 03/05/23   [provider]  B-D TB SYRINGE .5CC/27GX1/2" 27G X 1/2" 0.5 ML MISC USE 1 EACH ONCE A WEEK FOR 84 DAYS 05/24/22   [provider]  BD SAFETYGLIDE NEEDLE 18G X 1-1/2" MISC USE 1 EACH ONCE A WEEK FOR 84 DAYS 08/23/22   [provider]  benzonatate (TESSALON) 100 MG capsule Take 1 capsule (100 mg total) by mouth 2 (two) times daily as needed for cough. 08/19/23   Laure Kidney, PA-C   cetirizine (ZYRTEC ALLERGY) 10 MG tablet Take 1 tablet (10 mg total) by mouth daily for 7 days. 08/19/23 08/26/23  Laure Kidney, PA-C  clonazePAM (KLONOPIN) 0.5 MG tablet Take 0.5 mg by mouth daily as needed. 08/24/22   [provider]  cloNIDine (CATAPRES) 0.2 MG tablet Take 0.2 mg by mouth at bedtime. 04/09/23   [provider]  fluticasone (FLONASE) 50 MCG/ACT nasal spray Place 2 sprays into both nostrils daily. 08/19/23   Laure Kidney, PA-C  gabapentin (NEURONTIN) 300 MG capsule Take 300 mg by mouth 3 (three) times daily. 03/10/22   [provider]  ibuprofen (ADVIL) 200 MG tablet Take 800 mg by mouth every 6 (six) hours as needed (For leg pain).    [provider]  levothyroxine (SYNTHROID) 25 MCG tablet Take 1 tablet (25 mcg total) by mouth daily. 03/01/23   Gilmore Laroche, FNP  metFORMIN (GLUCOPHAGE) 500 MG tablet Take 1 tablet (500 mg total) by mouth 2 (two) times daily with a meal. 03/01/23   Gilmore Laroche, FNP  nitrofurantoin, macrocrystal-monohydrate, (MACROBID) 100 MG capsule Take 1 capsule (100 mg total) by mouth 2 (two) times daily. 04/09/23   [name], PA-C  omeprazole (PRILOSEC) 40 MG capsule Take 1 capsule (40 mg total) by mouth daily. 07/06/23 07/05/24  Gilmore Laroche, FNP  ondansetron (ZOFRAN) 4 MG tablet Take 1 tablet (4 mg total) by mouth every 8 (eight) hours  as needed for nausea or vomiting. 12/16/22   Daphine Deutscher, Mary-Margaret, FNP  ondansetron (ZOFRAN-ODT) 4 MG disintegrating tablet Take 4 mg by mouth 2 (two) times daily. 07/05/22   [provider]  promethazine (PHENERGAN) 25 MG tablet Take 25 mg by mouth every 6 (six) hours as needed. 03/22/22   [provider]  rosuvastatin (CRESTOR) 10 MG tablet Take 1 tablet (10 mg total) by mouth daily. 04/25/23   Gilmore Laroche, FNP  Semaglutide-Weight Management (WEGOVY) 0.25 MG/0.5ML SOAJ Inject 0.25 mg into the skin once a week. 04/25/23   Gilmore Laroche, FNP  testosterone  cypionate (DEPOTESTOSTERONE CYPIONATE) 200 MG/ML injection Inject into the skin. 0.3mg  once a week 05/24/22   [provider]  topiramate (TOPAMAX) 25 MG tablet Take 25 mg by mouth 2 (two) times daily. 08/15/22   [provider]  TUBERCULIN SYR 1CC/27GX1/2" 27G X 1/2" 1 ML MISC Use 1 each once a week for 84 days 05/24/22   [provider]  Vitamin D, Ergocalciferol, (DRISDOL) 1.25 MG (50000 UNIT) CAPS capsule Take 1 capsule (50,000 Units total) by mouth every 7 (seven) days. 09/19/22   Roswell Nickel, DO      Allergies    Strawberry extract    Review of Systems   Review of Systems  Physical Exam Updated Vital Signs BP (!) 145/92   Pulse 93   Temp 98.8 F (37.1 C) (Oral)   Resp 20   Ht 5\' 3"  (1.6 m)   Wt 117.9 kg   SpO2 95%   BMI 46.06 kg/m  Physical Exam Vitals and nursing note reviewed.  Constitutional:      General: He is not in acute distress.    Appearance: He is well-developed.  HENT:     Head: Normocephalic and atraumatic.     Right Ear: Tympanic membrane and ear canal normal.     Left Ear: Tympanic membrane and ear canal normal.     Mouth/Throat:     Mouth: Mucous membranes are moist.  Eyes:     General: Vision grossly intact. Gaze aligned appropriately.     Extraocular Movements: Extraocular movements intact.     Conjunctiva/sclera: Conjunctivae normal.  Cardiovascular:     Rate and Rhythm: Normal rate and regular rhythm.     Pulses: Normal pulses.     Heart sounds: Normal heart sounds, S1 normal and S2 normal. No murmur heard.    No friction rub. No gallop.  Pulmonary:     Effort: Pulmonary effort is normal. No respiratory distress.     Breath sounds: Normal breath sounds.  Abdominal:     General: Bowel sounds are normal.     Palpations: Abdomen is soft.     Tenderness: There is no abdominal tenderness. There is no guarding or rebound.     Hernia: No hernia is present.  Musculoskeletal:        General: No swelling.     Cervical  back: Full passive range of motion without pain, normal range of motion and neck supple. No spinous process tenderness or muscular tenderness. Normal range of motion.     Right lower leg: No edema.     Left lower leg: No edema.  Skin:    General: Skin is warm and dry.     Capillary Refill: Capillary refill takes less than 2 seconds.     Findings: No ecchymosis, erythema, rash or wound.  Neurological:     General: No focal deficit present.     Mental Status:  He is alert and oriented to person, place, and time.     GCS: GCS eye subscore is 4. GCS verbal subscore is 5. GCS motor subscore is 6.     Cranial Nerves: Cranial nerves 2-12 are intact.     Sensory: Sensation is intact.     Motor: Motor function is intact.     Coordination: Coordination is intact.  Psychiatric:        Attention and Perception: Attention normal.        Mood and Affect: Mood normal.        Speech: Speech normal.        Behavior: Behavior normal.     ED Results / Procedures / Treatments   Labs (all labs ordered are listed, but only abnormal results are displayed) Labs Reviewed - No data to display  EKG None  Radiology No results found.  Procedures Procedures    Medications Ordered in ED Medications  oxyCODONE-acetaminophen (PERCOCET/ROXICET) 5-325 MG per tablet 1 tablet (has no administration in time range)  pseudoephedrine (SUDAFED) tablet 60 mg (has no administration in time range)  amoxicillin (AMOXIL) capsule 1,000 mg (has no administration in time range)    ED Course/ Medical Decision Making/ A&P                                 Medical Decision Making  Patient with URI symptoms for more than a week.  Patient with progressively worsening ear pain.  No obvious erythema or bulging of the tympanic membrane examination.  Suspect secondary to eustachian tube dysfunction and congestion.  Patient appears well otherwise.        Final Clinical Impression(s) / ED Diagnoses Final diagnoses:   Right ear pain  Upper respiratory tract infection, unspecified type    Rx / DC Orders ED Discharge Orders          Ordered    ibuprofen (ADVIL) 800 MG tablet  Every 6 hours PRN        08/24/23 0026    amoxicillin (AMOXIL) 500 MG capsule  2 times daily        08/24/23 0026              Gilda Crease, MD 08/24/23 [phone]

## 2023-09-03 ENCOUNTER — Ambulatory Visit: Payer: Medicare Other | Admitting: Family Medicine

## 2023-09-10 ENCOUNTER — Encounter: Payer: Self-pay | Admitting: Family Medicine

## 2023-09-12 NOTE — Progress Notes (Deleted)
 Patient: [name] Date of Birth: [birth year]  Reason for Visit: Follow up History from: Patient Primary Neurologist: [name]  ASSESSMENT AND PLAN 33 y.o. year old adult   1.  OSA on CPAP -Increased pressure 5-14 cmH2O, few spells of waking up gasping for air, pressure parameters have been 5-11, mostly stays around 10.5 -We discussed the importance of nightly use, for greater than 4 hours, some of his data is skewed since he works nights, does not sleep for long periods of time during the day, we reviewed that CPAP data is cumulative over 24 hours. -I will pull a download in about 4 weeks to see if his greater than 4-hour usage has improved -He is motivated to lose weight, working on weight loss clinic -We will otherwise follow up in 6 months or sooner if needed  HISTORY OF PRESENT ILLNESS: Today 09/12/23   08/30/22 SS: Here for initial CPAP visit.  Had diagnostic PSG 05/18/22 showing obstructive sleep apnea.  OSA was mild, but much more pronounced during REM sleep.  Total AHI 13.9/hour, REM AHI 38.9/hour.  He initially presented with the symptoms of snoring and excessive daytime somnolence, waking up gasping for air. Using nasal pillow mask.  Set up date was 06/07/22.  Feels benefit from CPAP, feeling more resting, morning headaches resolved. Few times wake up at night rip mask off, gasping for air. Typically goes to bed around 9, puts mask on. Works nights, sleeps during the day. Usually only sleeps 3-4 hours during the day.  Notices better sleep on the days off, wakes up the next morning feeling great with CPAP.  Working with weight loss clinic. Is on testosterone, has resulted in weight gain.  ESS 6.  Review of CPAP data 07/31/22-08/29/22 shows you send 27/30 days at 90%, greater than 4 hours 14 days at 47%.  Average usage days used 4 hours and 28 minutes.  Minimum pressure 5, maximum pressure 11 cm water.  Pressure in the 95th percentile is 10.5, leak is 3.1, AHI 0.9.  HISTORY  I  saw your patient, [name], upon your kind request in my sleep clinic today for initial consultation of his sleep disorder, in particular, concern for underlying obstructive sleep apnea.  The patient is accompanied by his fianc today; he missed an appointment on 02/24/2022.  As you know, [name] is a 33 year old transgender (female to female) man with an underlying medical history of rheumatoid arthritis, pericarditis, Hx of MI, anxiety, depression, mixed hyperlipidemia, elevated liver enzymes, hypothyroidism, and morbid obesity with a BMI of over 45, who reports snoring and excessive daytime somnolence.  I reviewed the office note from 01/04/2022.  His Epworth sleepiness score is 11 out of 24, fatigue severity score is 53 out of 63.  He lives alone, his fiance is moving from [place] to [place] soon.  He has 3 cats in the household.  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  Bedtime generally during the workweek is 10 or 11 AM and rise time around 6 or 7 PM.  His schedule when he is not working is typically 9 to 10 PM in bed and rise time between 6 and 7 AM.  He does have nocturia about twice per average night and has woken up occasionally with a headache.  He quit smoking some 2 years ago but does vape nicotine daily.  He does not currently drink any alcohol  and drinks caffeine in the form of coffee, 1 a day typically.  He has been told that he had bradycardia at night.  He has also had apneic sounds and pauses in his breathing while asleep.  He has woken up with a sense of gasping for air.  He has a maternal uncle with sleep apnea.  He had sleep study testing in or around 2019 and it was negative for sleep apnea at the time per his recollection, testing was done in [place].  Prior test results are not available for my review today.  He reports a significant amount of weight gain in the interim in the realm of 40 pounds.  He is  currently on testosterone injections.   REVIEW OF SYSTEMS: Out of a complete 14 system review of symptoms, the patient complains only of the following symptoms, and all other reviewed systems are negative.  See HPI  ALLERGIES: Allergies  Allergen Reactions   Strawberry Extract Itching and Rash    HOME MEDICATIONS: Outpatient Medications Prior to Visit  Medication Sig Dispense Refill   amitriptyline (ELAVIL) 50 MG tablet Take 50 mg by mouth at bedtime.     amoxicillin (AMOXIL) 500 MG capsule Take 2 capsules (1,000 mg total) by mouth 2 (two) times daily. 40 capsule 0   amphetamine-dextroamphetamine (ADDERALL XR) 10 MG 24 hr capsule Take 10 mg by mouth every morning.     ARIPiprazole (ABILIFY) 10 MG tablet Take 1 tablet (10 mg total) by mouth daily. (Patient taking differently: Take 15 mg by mouth daily.) 30 tablet 0   ARIPiprazole (ABILIFY) 15 MG tablet Take 15 mg by mouth at bedtime.     B-D TB SYRINGE .5CC/27GX1/2" 27G X 1/2" 0.5 ML MISC USE 1 EACH ONCE A WEEK FOR 84 DAYS     BD SAFETYGLIDE NEEDLE 18G X 1-1/2" MISC USE 1 EACH ONCE A WEEK FOR 84 DAYS     benzonatate (TESSALON) 100 MG capsule Take 1 capsule (100 mg total) by mouth 2 (two) times daily as needed for cough. 20 capsule 0   cetirizine (ZYRTEC ALLERGY) 10 MG tablet Take 1 tablet (10 mg total) by mouth daily for 7 days. 7 tablet 0   clonazePAM (KLONOPIN) 0.5 MG tablet Take 0.5 mg by mouth daily as needed.     cloNIDine (CATAPRES) 0.2 MG tablet Take 0.2 mg by mouth at bedtime.     fluticasone (FLONASE) 50 MCG/ACT nasal spray Place 2 sprays into both nostrils daily. 16 g 6   gabapentin (NEURONTIN) 300 MG capsule Take 300 mg by mouth 3 (three) times daily.     ibuprofen (ADVIL) 200 MG tablet Take 800 mg by mouth every 6 (six) hours as needed (For leg pain).     ibuprofen (ADVIL) 800 MG tablet Take 1 tablet (800 mg total) by mouth every 6 (six) hours as needed for moderate pain (pain score 4-6). 20 tablet 0   levothyroxine  (SYNTHROID) 25 MCG tablet Take 1 tablet (25 mcg total) by mouth daily. 90 tablet 1   metFORMIN (GLUCOPHAGE) 500 MG tablet Take 1 tablet (500 mg total) by mouth 2 (two) times daily with a meal. 180 tablet 3   nitrofurantoin, macrocrystal-monohydrate, (MACROBID) 100 MG capsule Take 1 capsule (100 mg total) by mouth 2 (two) times daily. 10 capsule 0   omeprazole (PRILOSEC) 40 MG capsule Take 1 capsule (40 mg total) by mouth daily. 30 capsule 11   ondansetron (ZOFRAN) 4 MG tablet Take 1 tablet (4 mg total) by mouth  every 8 (eight) hours as needed for nausea or vomiting. 20 tablet 0   ondansetron (ZOFRAN-ODT) 4 MG disintegrating tablet Take 4 mg by mouth 2 (two) times daily.     promethazine (PHENERGAN) 25 MG tablet Take 25 mg by mouth every 6 (six) hours as needed.     rosuvastatin (CRESTOR) 10 MG tablet Take 1 tablet (10 mg total) by mouth daily. 90 tablet 3   Semaglutide-Weight Management (WEGOVY) 0.25 MG/0.5ML SOAJ Inject 0.25 mg into the skin once a week. 2 mL 0   testosterone cypionate (DEPOTESTOSTERONE CYPIONATE) 200 MG/ML injection Inject into the skin. 0.3mg  once a week     topiramate (TOPAMAX) 25 MG tablet Take 25 mg by mouth 2 (two) times daily.     TUBERCULIN SYR 1CC/27GX1/2" 27G X 1/2" 1 ML MISC Use 1 each once a week for 84 days     Vitamin D, Ergocalciferol, (DRISDOL) 1.25 MG (50000 UNIT) CAPS capsule Take 1 capsule (50,000 Units total) by mouth every 7 (seven) days. 5 capsule 0   No facility-administered medications prior to visit.    PAST MEDICAL HISTORY: Past Medical History:  Diagnosis Date   ADHD    Anxiety attack    panic attacks   Back pain    Bipolar disorder (HCC)    Chest pain    Constipation    Depression    Drug use    Food allergy    Gallbladder problem    GERD (gastroesophageal reflux disease)    Headache    High blood pressure    History of heart attack    History of stomach ulcers    based on symptoms, around age 76   HLD (hyperlipidemia)     Hypothyroidism    Joint pain    Myocardial infarction [facility]) 2013   age 37- patient reports it was stopped with nitroglycerine   Osteoarthritis    Pericarditis    RA (rheumatoid arthritis) (HCC)    Sleep apnea    SOB (shortness of breath)    Still disease, 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HCC)    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HCC)    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HCC)    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HCC)    Swallowing difficulty    Vitamin D deficiency     PAST SURGICAL HISTORY: Past Surgical History:  Procedure Laterality Date   BALLOON DILATION N/A 12/22/2022   Procedure: BALLOON DILATION;  Surgeon: Lanelle Bal, DO;  Location: [facility];  Service: Endoscopy;  Laterality: N/A;   CHOLECYSTECTOMY N/A 08/29/2021   Procedure: LAPAROSCOPIC CHOLECYSTECTOMY WITH ICG;  Surgeon: Andria Meuse, MD;  Location: [facility];  Service: General;  Laterality: N/A;   ESOPHAGOGASTRODUODENOSCOPY (EGD) WITH PROPOFOL N/A 12/22/2022   Procedure: ESOPHAGOGASTRODUODENOSCOPY (EGD) WITH PROPOFOL;  Surgeon: Lanelle Bal, DO;  Location: [facility];  Service: Endoscopy;  Laterality: N/A;  1:30 pm, asa 3   WISDOM TOOTH EXTRACTION      FAMILY HISTORY: Family History  Problem Relation Age of Onset   Heart disease Mother    Heart attack Mother    High blood pressure Mother    Sudden death Mother    Depression Father    Rheum arthritis Father    Hypertension Father    Bipolar disorder Brother    Colon cancer Neg Hx     SOCIAL HISTORY: Social History   Socioeconomic History   Marital status: Significant Other    Spouse name: Not on file   Number of children: Not on file   Years  of education: Not on file   Highest education level: Some college, no degree  Occupational History   Not on file  Tobacco Use   Smoking status: Former    Current packs/day: 0.00    Average packs/day: 1 pack/day for 10.4 years (10.4 ttl pk-yrs)    Types: Cigarettes, E-cigarettes    Start date: 07/27/2009    Quit date: 12/2019    Years since quitting: 3.7     Passive exposure: Current   Smokeless tobacco: Never  Vaping Use   Vaping status: Every Day   Substances: Nicotine, Flavoring  Substance and Sexual Activity   Alcohol use: Not Currently   Drug use: Not Currently    Types: Marijuana    Comment: daily   Sexual activity: Yes    Partners: Female    Birth control/protection: None  Other Topics Concern   Not on file  Social History Narrative   Not on file   Social Drivers of Health   Financial Resource Strain: Medium Risk (08/30/2023)   Overall Financial Resource Strain (CARDIA)    Difficulty of Paying Living Expenses: Somewhat hard  Food Insecurity: No Food Insecurity (08/30/2023)   Hunger Vital Sign    Worried About Running Out of Food in the Last Year: Never true    Ran Out of Food in the Last Year: Never true  Transportation Needs: No Transportation Needs (08/30/2023)   PRAPARE - Administrator, Civil Service (Medical): No    Lack of Transportation (Non-Medical): No  Physical Activity: Insufficiently Active (08/30/2023)   Exercise Vital Sign    Days of Exercise per Week: 1 day    Minutes of Exercise per Session: 30 min  Stress: Stress Concern Present (08/30/2023)   [employer] of Occupational Health - Occupational Stress Questionnaire    Feeling of Stress : To some extent  Social Connections: Socially Isolated (08/30/2023)   Social Connection and Isolation Panel [NHANES]    Frequency of Communication with Friends and Family: More than three times a week    Frequency of Social Gatherings with Friends and Family: More than three times a week    Attends Religious Services: Never    Database administrator or Organizations: No    Attends Engineer, structural: Not on file    Marital Status: Never married  Intimate Partner Violence: Not At Risk (04/24/2022)   Received from [facility]   Humiliation, Afraid, Rape, and Kick questionnaire    Fear of Current or Ex-Partner: No     Emotionally Abused: No    Physically Abused: No    Sexually Abused: No    PHYSICAL EXAM  There were no vitals filed for this visit.  There is no height or weight on file to calculate BMI.  Generalized: Well developed, in no acute distress  Neurological examination  Mentation: Alert oriented to time, place, history taking. Follows all commands speech and language fluent Cranial nerve II-XII: Pupils were equal round reactive to light. Extraocular movements were full, visual field were full on confrontational test. Facial sensation and strength were normal.  Head turning and shoulder shrug  were normal and symmetric. Motor: Decreased bilateral hip flexion due to pain Sensory: Sensory testing is intact to soft touch on all 4 extremities. No evidence of extinction is noted.  Coordination: Cerebellar testing reveals good finger-nose-finger and heel-to-shin bilaterally.  Gait and station: Gait is antalgic.  DIAGNOSTIC DATA (LABS, IMAGING, TESTING) - I reviewed patient records, labs,  notes, testing and imaging myself where available.  Lab Results  Component Value Date   WBC 12.3 (H) 04/24/2023   HGB 14.4 04/24/2023   HCT 45.2 (H) 04/24/2023   MCV 85.0 04/24/2023   PLT 320 04/24/2023      Component Value Date/Time   NA 137 02/28/2023 0832   K 4.4 02/28/2023 0832   CL 99 02/28/2023 0832   CO2 21 02/28/2023 0832   GLUCOSE 85 02/28/2023 0832   GLUCOSE 113 (H) 12/20/2022 0848   BUN 8 02/28/2023 0832   CREATININE 1.19 (H) 02/28/2023 0832   CALCIUM 10.5 (H) 02/28/2023 0832   PROT 7.9 02/28/2023 0832   ALBUMIN 4.7 02/28/2023 0832   AST 27 02/28/2023 0832   ALT 46 (H) 02/28/2023 0832   ALKPHOS 83 02/28/2023 0832   BILITOT 0.8 02/28/2023 0832   GFRNONAA >60 12/20/2022 0848   GFRAA >60 05/01/2019 1927   Lab Results  Component Value Date   CHOL 192 02/28/2023   HDL 38 (L) 02/28/2023   LDLCALC 125 (H) 02/28/2023   TRIG 163 (H) 02/28/2023   CHOLHDL 5.1 (H) 02/28/2023   Lab  Results  Component Value Date   HGBA1C 5.6 02/28/2023   Lab Results  Component Value Date   VITAMINB12 476 05/23/2022   Lab Results  Component Value Date   TSH 6.080 (H) 02/28/2023    Margie Ege, AGNP-C, DNP 09/12/2023, 5:17 PM [facility] [address] [place], [place] [address] [phone]

## 2023-09-13 ENCOUNTER — Ambulatory Visit: Payer: 59 | Admitting: Neurology

## 2023-09-13 ENCOUNTER — Encounter: Payer: Self-pay | Admitting: Neurology

## 2023-11-01 ENCOUNTER — Encounter: Payer: Self-pay | Admitting: *Deleted

## 2023-11-06 NOTE — H&P (View-Only) (Signed)
 Referring Provider: Royann Shivers, * Primary Care Physician:  Royann Shivers, PA-C Primary GI Physician: Dr. Marletta Lor  Chief Complaint  Patient presents with   Gastroesophageal Reflux    Having issues with reflux and nausea, currently taking omeprazole 40 mg once daily. Has been on the omeprazole for years. Has been on pantoprazole 20 mg bid and stated that it did not work. States that if he remembers to take his meds then the reflux isnt bad its the nausea all the time that is really the bother.     HPI:   [name] is a 33 y.o. adult with history of GERD, dysphagia in the setting of Schatzki's ring, presenting today at the request of Wayland Denis, PA-C for nausea and acid reflux.  Patient was last seen in our office at the time of initial consult 11/27/2022 for chronic GERD which was uncontrolled on omeprazole 1-2 times daily.  Also reported solid food and pill dysphagia, intermittent very short-lived epigastric abdominal pain lasting 3 to 5 seconds occurring several days a week without specific trigger.  Was taking daily NSAIDs due to chronic hip/back pain.  Reported previously taking 12 ibuprofen a day, down to 4/day.  Recommended EGD, stop omeprazole, start pantoprazole 40 mg daily, avoid NSAIDs.  EGD 12/22/2022: Small hiatal hernia, mild Schatzki's ring dilated, gastritis biopsied.  Recommended resuming omeprazole 40 mg daily as patient felt this helped better than pantoprazole. Pathology was benign.  No H. pylori.   Today: Reports having issues with reflux and nausea, currently taking omeprazole 40 mg once a day. Has been on this for years.   Omeprazole works for heartburn as long as he remembers to take it. Does require it every day. Has been having a lot of nausea and is also vomiting a lot. Vomiting is pretty much every day and with most everything he eats. Started about 2.5 months ago. Reports weight loss. Intermittent sensation of beef getting stuck in  his lower chest. Eating primarily chicken. Hasearly satiety. Not able to finish his meals which is unusual for him.    Having some mid  abdominal pain today, but not typically. Cramping.    No brbpr or melena. No issues with going to the bathroom.    NSAIDs:  For the last month, has cut back on ibuprofen. Hasn't had any in 2-3 weeks.  Was taking 4-6 a day. Now taking tramadol. Needs bilateral hip replacement but has to lose weight first. Planning to see bariatric surgeon on the 23rd of this month to discuss gastric bypass.   Had blood work with PCP  about 1.5 weeks ago and was told he had elevated liver enzymes.     Wt Readings from Last 3 Encounters:  11/08/23 283 lb 12.8 oz (128.7 kg)  08/23/23 260 lb (117.9 kg)  05/03/23 286 lb (129.7 kg)      Past Medical History:  Diagnosis Date   ADHD    Anxiety attack    panic attacks   Back pain    Bipolar disorder (HCC)    Chest pain    Constipation    Depression    Drug use    Food allergy    Gallbladder problem    GERD (gastroesophageal reflux disease)    Headache    High blood pressure    History of heart attack    History of stomach ulcers    based on symptoms, around age 2   HLD (hyperlipidemia)    Hypothyroidism  Joint pain    Myocardial infarction [place]) 2013   age 25- patient reports it was stopped with nitroglycerine   Osteoarthritis    Pericarditis    RA (rheumatoid arthritis) (HCC)    Sleep apnea    SOB (shortness of breath)    Still disease, 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HCC)    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HCC)    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HCC)    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HCC)    Swallowing difficulty    Vitamin D deficiency     Past Surgical History:  Procedure Laterality Date   BALLOON DILATION N/A 12/22/2022   Procedure: BALLOON DILATION;  Surgeon: Lanelle Bal, DO;  Location: [facility];  Service: Endoscopy;  Laterality: N/A;   CHOLECYSTECTOMY N/A 08/29/2021   Procedure: LAPAROSCOPIC CHOLECYSTECTOMY WITH ICG;  Surgeon:  Andria Meuse, MD;  Location: [facility];  Service: General;  Laterality: N/A;   ESOPHAGOGASTRODUODENOSCOPY (EGD) WITH PROPOFOL N/A 12/22/2022   Surgeon: Lanelle Bal, DO;   Small hiatal hernia, mild Schatzki's ring dilated, gastritis biopsied.  Path benign.   WISDOM TOOTH EXTRACTION      Current Outpatient Medications  Medication Sig Dispense Refill   amitriptyline (ELAVIL) 100 MG tablet Take 100 mg by mouth at bedtime.     ARIPiprazole (ABILIFY) 15 MG tablet Take 15 mg by mouth at bedtime.     busPIRone (BUSPAR) 5 MG tablet Take 5 mg by mouth 2 (two) times daily.     clonazePAM (KLONOPIN) 0.5 MG tablet Take 0.5 mg by mouth daily as needed.     cloNIDine (CATAPRES) 0.2 MG tablet Take 0.2 mg by mouth at bedtime.     fluticasone (FLONASE) 50 MCG/ACT nasal spray Place 2 sprays into both nostrils daily. 16 g 6   gabapentin (NEURONTIN) 300 MG capsule Take 300 mg by mouth 3 (three) times daily.     ibuprofen (ADVIL) 200 MG tablet Take 800 mg by mouth every 6 (six) hours as needed (For leg pain).     levothyroxine (SYNTHROID) 25 MCG tablet Take 1 tablet (25 mcg total) by mouth daily. 90 tablet 1   mirtazapine (REMERON) 30 MG tablet Take 30 mg by mouth at bedtime.     omeprazole (PRILOSEC) 40 MG capsule Take 1 capsule (40 mg total) by mouth 2 (two) times daily before a meal. 60 capsule 3   ondansetron (ZOFRAN) 4 MG tablet Take 1 tablet (4 mg total) by mouth every 8 (eight) hours as needed for nausea or vomiting. 60 tablet 1   ondansetron (ZOFRAN-ODT) 4 MG disintegrating tablet Take 4 mg by mouth 2 (two) times daily.     rosuvastatin (CRESTOR) 10 MG tablet Take 1 tablet (10 mg total) by mouth daily. 90 tablet 3   testosterone cypionate (DEPOTESTOSTERONE CYPIONATE) 200 MG/ML injection Inject into the skin. 0.3mg  once a week     traMADol (ULTRAM) 50 MG tablet Take 50 mg by mouth daily.     TUBERCULIN SYR 1CC/27GX1/2" 27G X 1/2" 1 ML MISC Use 1 each once a week for 84 days     Vitamin D,  Ergocalciferol, (DRISDOL) 1.25 MG (50000 UNIT) CAPS capsule Take 1 capsule (50,000 Units total) by mouth every 7 (seven) days. 5 capsule 0   No current facility-administered medications for this visit.    Allergies as of 11/08/2023 - Review Complete 11/08/2023  Allergen Reaction Noted   Strawberry extract Itching and Rash 11/16/2012    Family History  Problem Relation Age of Onset   Heart disease Mother  Heart attack Mother    High blood pressure Mother    Sudden death Mother    Depression Father    Rheum arthritis Father    Hypertension Father    Bipolar disorder Brother    Colon cancer Neg Hx     Social History   Socioeconomic History   Marital status: Significant Other    Spouse name: Not on file   Number of children: Not on file   Years of education: Not on file   Highest education level: Some college, no degree  Occupational History   Not on file  Tobacco Use   Smoking status: Former    Current packs/day: 0.00    Average packs/day: 1 pack/day for 10.4 years (10.4 ttl pk-yrs)    Types: Cigarettes, E-cigarettes    Start date: 07/27/2009    Quit date: 12/2019    Years since quitting: 3.8    Passive exposure: Current   Smokeless tobacco: Never  Vaping Use   Vaping status: Every Day   Substances: Nicotine, Flavoring  Substance and Sexual Activity   Alcohol use: Not Currently   Drug use: Not Currently    Types: Marijuana    Comment: daily   Sexual activity: Yes    Partners: Female    Birth control/protection: None  Other Topics Concern   Not on file  Social History Narrative   Not on file   Social Drivers of Health   Financial Resource Strain: Medium Risk (08/30/2023)   Overall Financial Resource Strain (CARDIA)    Difficulty of Paying Living Expenses: Somewhat hard  Food Insecurity: No Food Insecurity (08/30/2023)   Hunger Vital Sign    Worried About Running Out of Food in the Last Year: Never true    Ran Out of Food in the Last Year: Never true   Transportation Needs: No Transportation Needs (08/30/2023)   PRAPARE - Administrator, Civil Service (Medical): No    Lack of Transportation (Non-Medical): No  Physical Activity: Insufficiently Active (08/30/2023)   Exercise Vital Sign    Days of Exercise per Week: 1 day    Minutes of Exercise per Session: 30 min  Stress: Stress Concern Present (08/30/2023)   [employer] of Occupational Health - Occupational Stress Questionnaire    Feeling of Stress : To some extent  Social Connections: Socially Isolated (08/30/2023)   Social Connection and Isolation Panel [NHANES]    Frequency of Communication with Friends and Family: More than three times a week    Frequency of Social Gatherings with Friends and Family: More than three times a week    Attends Religious Services: Never    Database administrator or Organizations: No    Attends Engineer, structural: Not on file    Marital Status: Never married    Review of Systems: Gen: Denies fever, chills, cold or flulike symptoms, presyncope, syncope. CV: Denies chest pain, palpitations. Resp: Denies dyspnea at rest, cough.  GI: See HPI Heme: See HPI  Physical Exam: BP 136/86 (BP Location: Right Arm, Patient Position: Sitting, Cuff Size: Normal) Comment (BP Location): Taken on lower right arm per pt preference  Pulse (!) 101   Temp 98.2 F (36.8 C) (Oral)   Ht 5\' 4"  (1.626 m)   Wt 283 lb 12.8 oz (128.7 kg)   LMP  (LMP Unknown) Comment: pt is on testosterone  SpO2 97%   BMI 48.71 kg/m  General:   Alert and oriented. No distress noted. Pleasant and cooperative.  Head:  Normocephalic and atraumatic. Eyes:  Conjuctiva clear without scleral icterus. Heart:  S1, S2 present without murmurs appreciated. Lungs:  Clear to auscultation bilaterally. No wheezes, rales, or rhonchi. No distress.  Abdomen:  +BS, soft, non-tender and non-distended. No rebound or guarding. No HSM or masses noted. Msk:  Symmetrical without gross  deformities. Normal posture. Extremities:  Without edema. Neurologic:  Alert and  oriented x4 Psych:  Normal mood and affect.    Assessment:  33 year old transgender female with history of stills disease, RA, sleep apnea, HLD, HTN, hypothyroidism, depression, anxiety, GERD, dysphagia in the setting of Schatzki's ring previously dilated in 2024,, presenting today for further evaluation of nausea and vomiting.  Nausea/vomiting/early satiety: Daily symptoms for the last 2.5 months.  History of chronic NSAID use though none in the last 2 to 3 weeks.  Reports typical GERD symptoms are well-controlled as long as he takes omeprazole 40 mg daily.  History of cholecystectomy.  Reports recent labs with PCP with elevated liver enzymes.  Notes some crampy mid abdominal pain today.  I recommended abdominal ultrasound to reevaluate his biliary system as well as his liver, EGD to rule out gastritis, duodenitis, PUD, H. pylori, G00, and increase omeprazole to 40 mg twice daily.    Dysphagia: Mild recurrent symptoms occurring only with beef.  As we are proceeding with an EGD, will add possible esophageal dilation as appropriate.  He may have had recurrent Schatzki's ring.  Elevated LFTs: Per patient's report on recent blood work with PCP.  Will request labs.  Also obtaining abdominal ultrasound.    Plan:  Initially ordered RUQ ultrasound, but have requested this to be changed to abdominal ultrasound due to reports of elevated LFTs so I can see spleen as well.  Request recent labs from PCP Proceed with upper endoscopy +/- dilation with propofol by Dr. Marletta Lor in near future. The risks, benefits, and alternatives have been discussed with the patient in detail. The patient states understanding and desires to proceed.  ASA 3 Increase omeprazole to 40 mg twice daily Zofran 4 mg every 8 hours as needed. Follow a GERD diet:  Avoid fried, fatty, greasy, spicy, citrus foods. Avoid caffeine and carbonated  beverages. Avoid chocolate. Try eating 4-6 small meals a day rather than 3 large meals. Do not eat within 3 hours of laying down. Prop head of bed up on wood or bricks to create a 6 inch incline. Dysphagia precautions:  Eat slowly, take small bites, chew thoroughly, drink plenty of liquids throughout meals.  Avoid trough textures All meats should be chopped finely.  If something gets hung in your esophagus and will not come up or go down, proceed to the emergency room.   Continue to avoid NSAIDs. Follow-up after EGD   Ermalinda Memos, PA-C [facility] 11/08/2023

## 2023-11-06 NOTE — Progress Notes (Unsigned)
 Referring Provider: Royann Shivers, * Primary Care Physician:  Royann Shivers, PA-C Primary GI Physician: Dr. Marletta Lor  Chief Complaint  Patient presents with   Gastroesophageal Reflux    Having issues with reflux and nausea, currently taking omeprazole 40 mg once daily. Has been on the omeprazole for years. Has been on pantoprazole 20 mg bid and stated that it did not work. States that if he remembers to take his meds then the reflux isnt bad its the nausea all the time that is really the bother.     HPI:   [name] is a 33 y.o. adult with history of GERD, dysphagia in the setting of Schatzki's ring, presenting today at the request of Wayland Denis, PA-C for nausea and acid reflux.  Patient was last seen in our office at the time of initial consult 11/27/2022 for chronic GERD which was uncontrolled on omeprazole 1-2 times daily.  Also reported solid food and pill dysphagia, intermittent very short-lived epigastric abdominal pain lasting 3 to 5 seconds occurring several days a week without specific trigger.  Was taking daily NSAIDs due to chronic hip/back pain.  Reported previously taking 12 ibuprofen a day, down to 4/day.  Recommended EGD, stop omeprazole, start pantoprazole 40 mg daily, avoid NSAIDs.  EGD 12/22/2022: Small hiatal hernia, mild Schatzki's ring dilated, gastritis biopsied.  Recommended resuming omeprazole 40 mg daily as patient felt this helped better than pantoprazole. Pathology was benign.  No H. pylori.   Today: Reports having issues with reflux and nausea, currently taking omeprazole 40 mg once a day. Has been on this for years.   Omeprazole works for heartburn as long as he remembers to take it. Does require it every day. Has been having a lot of nausea and is also vomiting a lot. Vomiting is pretty much every day and with most everything he eats. Started about 2.5 months ago. Reports weight loss. Intermittent sensation of beef getting stuck in  his lower chest. Eating primarily chicken. Hasearly satiety. Not able to finish his meals which is unusual for him.    Having some mid  abdominal pain today, but not typically. Cramping.    No brbpr or melena. No issues with going to the bathroom.    NSAIDs:  For the last month, has cut back on ibuprofen. Hasn't had any in 2-3 weeks.  Was taking 4-6 a day. Now taking tramadol. Needs bilateral hip replacement but has to lose weight first. Planning to see bariatric surgeon on the 23rd of this month to discuss gastric bypass.   Had blood work with PCP  about 1.5 weeks ago and was told he had elevated liver enzymes.     Wt Readings from Last 3 Encounters:  11/08/23 283 lb 12.8 oz (128.7 kg)  08/23/23 260 lb (117.9 kg)  05/03/23 286 lb (129.7 kg)      Past Medical History:  Diagnosis Date   ADHD    Anxiety attack    panic attacks   Back pain    Bipolar disorder (HCC)    Chest pain    Constipation    Depression    Drug use    Food allergy    Gallbladder problem    GERD (gastroesophageal reflux disease)    Headache    High blood pressure    History of heart attack    History of stomach ulcers    based on symptoms, around age 2   HLD (hyperlipidemia)    Hypothyroidism  Joint pain    Myocardial infarction [place]) 2013   age 25- patient reports it was stopped with nitroglycerine   Osteoarthritis    Pericarditis    RA (rheumatoid arthritis) (HCC)    Sleep apnea    SOB (shortness of breath)    Still disease, 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HCC)    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HCC)    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HCC)    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HCC)    Swallowing difficulty    Vitamin D deficiency     Past Surgical History:  Procedure Laterality Date   BALLOON DILATION N/A 12/22/2022   Procedure: BALLOON DILATION;  Surgeon: Lanelle Bal, DO;  [facility];  Service: Endoscopy;  Laterality: N/A;   CHOLECYSTECTOMY N/A 08/29/2021   Procedure: LAPAROSCOPIC CHOLECYSTECTOMY WITH ICG;  Surgeon:  Andria Meuse, MD;  Location: [facility];  Service: General;  Laterality: N/A;   ESOPHAGOGASTRODUODENOSCOPY (EGD) WITH PROPOFOL N/A 12/22/2022   Surgeon: Lanelle Bal, DO;   Small hiatal hernia, mild Schatzki's ring dilated, gastritis biopsied.  Path benign.   WISDOM TOOTH EXTRACTION      Current Outpatient Medications  Medication Sig Dispense Refill   amitriptyline (ELAVIL) 100 MG tablet Take 100 mg by mouth at bedtime.     ARIPiprazole (ABILIFY) 15 MG tablet Take 15 mg by mouth at bedtime.     busPIRone (BUSPAR) 5 MG tablet Take 5 mg by mouth 2 (two) times daily.     clonazePAM (KLONOPIN) 0.5 MG tablet Take 0.5 mg by mouth daily as needed.     cloNIDine (CATAPRES) 0.2 MG tablet Take 0.2 mg by mouth at bedtime.     fluticasone (FLONASE) 50 MCG/ACT nasal spray Place 2 sprays into both nostrils daily. 16 g 6   gabapentin (NEURONTIN) 300 MG capsule Take 300 mg by mouth 3 (three) times daily.     ibuprofen (ADVIL) 200 MG tablet Take 800 mg by mouth every 6 (six) hours as needed (For leg pain).     levothyroxine (SYNTHROID) 25 MCG tablet Take 1 tablet (25 mcg total) by mouth daily. 90 tablet 1   mirtazapine (REMERON) 30 MG tablet Take 30 mg by mouth at bedtime.     omeprazole (PRILOSEC) 40 MG capsule Take 1 capsule (40 mg total) by mouth 2 (two) times daily before a meal. 60 capsule 3   ondansetron (ZOFRAN) 4 MG tablet Take 1 tablet (4 mg total) by mouth every 8 (eight) hours as needed for nausea or vomiting. 60 tablet 1   ondansetron (ZOFRAN-ODT) 4 MG disintegrating tablet Take 4 mg by mouth 2 (two) times daily.     rosuvastatin (CRESTOR) 10 MG tablet Take 1 tablet (10 mg total) by mouth daily. 90 tablet 3   testosterone cypionate (DEPOTESTOSTERONE CYPIONATE) 200 MG/ML injection Inject into the skin. 0.3mg  once a week     traMADol (ULTRAM) 50 MG tablet Take 50 mg by mouth daily.     TUBERCULIN SYR 1CC/27GX1/2" 27G X 1/2" 1 ML MISC Use 1 each once a week for 84 days     Vitamin D,  Ergocalciferol, (DRISDOL) 1.25 MG (50000 UNIT) CAPS capsule Take 1 capsule (50,000 Units total) by mouth every 7 (seven) days. 5 capsule 0   No current facility-administered medications for this visit.    Allergies as of 11/08/2023 - Review Complete 11/08/2023  Allergen Reaction Noted   Strawberry extract Itching and Rash 11/16/2012    Family History  Problem Relation Age of Onset   Heart disease Mother  Heart attack Mother    High blood pressure Mother    Sudden death Mother    Depression Father    Rheum arthritis Father    Hypertension Father    Bipolar disorder Brother    Colon cancer Neg Hx     Social History   Socioeconomic History   Marital status: Significant Other    Spouse name: Not on file   Number of children: Not on file   Years of education: Not on file   Highest education level: Some college, no degree  Occupational History   Not on file  Tobacco Use   Smoking status: Former    Current packs/day: 0.00    Average packs/day: 1 pack/day for 10.4 years (10.4 ttl pk-yrs)    Types: Cigarettes, E-cigarettes    Start date: 07/27/2009    Quit date: 12/2019    Years since quitting: 3.8    Passive exposure: Current   Smokeless tobacco: Never  Vaping Use   Vaping status: Every Day   Substances: Nicotine, Flavoring  Substance and Sexual Activity   Alcohol use: Not Currently   Drug use: Not Currently    Types: Marijuana    Comment: daily   Sexual activity: Yes    Partners: Female    Birth control/protection: None  Other Topics Concern   Not on file  Social History Narrative   Not on file   Social Drivers of Health   Financial Resource Strain: Medium Risk (08/30/2023)   Overall Financial Resource Strain (CARDIA)    Difficulty of Paying Living Expenses: Somewhat hard  Food Insecurity: No Food Insecurity (08/30/2023)   Hunger Vital Sign    Worried About Running Out of Food in the Last Year: Never true    Ran Out of Food in the Last Year: Never true   Transportation Needs: No Transportation Needs (08/30/2023)   PRAPARE - Administrator, Civil Service (Medical): No    Lack of Transportation (Non-Medical): No  Physical Activity: Insufficiently Active (08/30/2023)   Exercise Vital Sign    Days of Exercise per Week: 1 day    Minutes of Exercise per Session: 30 min  Stress: Stress Concern Present (08/30/2023)   [employer] of Occupational Health - Occupational Stress Questionnaire    Feeling of Stress : To some extent  Social Connections: Socially Isolated (08/30/2023)   Social Connection and Isolation Panel [NHANES]    Frequency of Communication with Friends and Family: More than three times a week    Frequency of Social Gatherings with Friends and Family: More than three times a week    Attends Religious Services: Never    Database administrator or Organizations: No    Attends Engineer, structural: Not on file    Marital Status: Never married    Review of Systems: Gen: Denies fever, chills, cold or flulike symptoms, presyncope, syncope. CV: Denies chest pain, palpitations. Resp: Denies dyspnea at rest, cough.  GI: See HPI Heme: See HPI  Physical Exam: BP 136/86 (BP Location: Right Arm, Patient Position: Sitting, Cuff Size: Normal) Comment (BP Location): Taken on lower right arm per pt preference  Pulse (!) 101   Temp 98.2 F (36.8 C) (Oral)   Ht 5\' 4"  (1.626 m)   Wt 283 lb 12.8 oz (128.7 kg)   LMP  (LMP Unknown) Comment: pt is on testosterone  SpO2 97%   BMI 48.71 kg/m  General:   Alert and oriented. No distress noted. Pleasant and cooperative.  Head:  Normocephalic and atraumatic. Eyes:  Conjuctiva clear without scleral icterus. Heart:  S1, S2 present without murmurs appreciated. Lungs:  Clear to auscultation bilaterally. No wheezes, rales, or rhonchi. No distress.  Abdomen:  +BS, soft, non-tender and non-distended. No rebound or guarding. No HSM or masses noted. Msk:  Symmetrical without gross  deformities. Normal posture. Extremities:  Without edema. Neurologic:  Alert and  oriented x4 Psych:  Normal mood and affect.    Assessment:  33 year old transgender female with history of stills disease, RA, sleep apnea, HLD, HTN, hypothyroidism, depression, anxiety, GERD, dysphagia in the setting of Schatzki's ring previously dilated in 2024,, presenting today for further evaluation of nausea and vomiting.  Nausea/vomiting/early satiety: Daily symptoms for the last 2.5 months.  History of chronic NSAID use though none in the last 2 to 3 weeks.  Reports typical GERD symptoms are well-controlled as long as he takes omeprazole 40 mg daily.  History of cholecystectomy.  Reports recent labs with PCP with elevated liver enzymes.  Notes some crampy mid abdominal pain today.  I recommended abdominal ultrasound to reevaluate his biliary system as well as his liver, EGD to rule out gastritis, duodenitis, PUD, H. pylori, G00, and increase omeprazole to 40 mg twice daily.    Dysphagia: Mild recurrent symptoms occurring only with beef.  As we are proceeding with an EGD, will add possible esophageal dilation as appropriate.  He may have had recurrent Schatzki's ring.  Elevated LFTs: Per patient's report on recent blood work with PCP.  Will request labs.  Also obtaining abdominal ultrasound.    Plan:  Initially ordered RUQ ultrasound, but have requested this to be changed to abdominal ultrasound due to reports of elevated LFTs so I can see spleen as well.  Request recent labs from PCP Proceed with upper endoscopy +/- dilation with propofol by Dr. Marletta Lor in near future. The risks, benefits, and alternatives have been discussed with the patient in detail. The patient states understanding and desires to proceed.  ASA 3 Increase omeprazole to 40 mg twice daily Zofran 4 mg every 8 hours as needed. Follow a GERD diet:  Avoid fried, fatty, greasy, spicy, citrus foods. Avoid caffeine and carbonated  beverages. Avoid chocolate. Try eating 4-6 small meals a day rather than 3 large meals. Do not eat within 3 hours of laying down. Prop head of bed up on wood or bricks to create a 6 inch incline. Dysphagia precautions:  Eat slowly, take small bites, chew thoroughly, drink plenty of liquids throughout meals.  Avoid trough textures All meats should be chopped finely.  If something gets hung in your esophagus and will not come up or go down, proceed to the emergency room.   Continue to avoid NSAIDs. Follow-up after EGD   Ermalinda Memos, PA-C [facility] 11/08/2023

## 2023-11-08 ENCOUNTER — Encounter: Payer: Self-pay | Admitting: *Deleted

## 2023-11-08 ENCOUNTER — Ambulatory Visit: Admitting: Gastroenterology

## 2023-11-08 ENCOUNTER — Encounter: Payer: Self-pay | Admitting: Gastroenterology

## 2023-11-08 ENCOUNTER — Telehealth: Payer: Self-pay | Admitting: Gastroenterology

## 2023-11-08 VITALS — BP 136/86 | HR 101 | Temp 98.2°F | Ht 64.0 in | Wt 283.8 lb

## 2023-11-08 DIAGNOSIS — R112 Nausea with vomiting, unspecified: Secondary | ICD-10-CM

## 2023-11-08 DIAGNOSIS — R6881 Early satiety: Secondary | ICD-10-CM

## 2023-11-08 DIAGNOSIS — R7989 Other specified abnormal findings of blood chemistry: Secondary | ICD-10-CM | POA: Diagnosis not present

## 2023-11-08 DIAGNOSIS — R1310 Dysphagia, unspecified: Secondary | ICD-10-CM | POA: Diagnosis not present

## 2023-11-08 DIAGNOSIS — K219 Gastro-esophageal reflux disease without esophagitis: Secondary | ICD-10-CM

## 2023-11-08 MED ORDER — ONDANSETRON HCL 4 MG PO TABS: 4.0000 mg | ORAL_TABLET | Freq: Three times a day (TID) | ORAL | 1 refills | Status: DC | PRN

## 2023-11-08 MED ORDER — OMEPRAZOLE 40 MG PO CPDR
40.0000 mg | DELAYED_RELEASE_CAPSULE | Freq: Two times a day (BID) | ORAL | 3 refills | Status: DC
Start: 2023-11-08 — End: 2024-02-05

## 2023-11-08 NOTE — Telephone Encounter (Signed)
 Tammy, anyway that we can change patient's RUQ ultrasound to an abdominal ultrasound?  Considering his report of elevated liver enzymes, I would like to be able to see his spleen as well.

## 2023-11-08 NOTE — Patient Instructions (Signed)
 We will get you scheduled for an ultrasound of your liver at [facility].  We will schedule you for an upper endoscopy with possible stretching of your esophagus with Dr. Marletta Lor.  Increase omeprazole to 40 mg twice daily 30 minutes before breakfast and dinner.  Use Zofran 4 mg every 8 hours as needed for nausea and vomiting.   Avoid fried, fatty, greasy, spicy, citrus foods. Avoid caffeine and carbonated beverages. Avoid chocolate. Try eating 4-6 small meals a day rather than 3 large meals. Do not eat within 3 hours of laying down. Prop head of bed up on wood or bricks to create a 6 inch incline.  Swallowing precautions:  Eat slowly, take small bites, chew thoroughly, drink plenty of liquids throughout meals.  Avoid trough textures All meats should be chopped finely.  If something gets hung in your esophagus and will not come up or go down, proceed to the emergency room.     I will see you back in the office after your upper endoscopy.   Ermalinda Memos, PA-C [facility]

## 2023-11-09 ENCOUNTER — Encounter: Payer: Self-pay | Admitting: Cardiology

## 2023-11-09 ENCOUNTER — Ambulatory Visit: Payer: 59 | Attending: Cardiology | Admitting: Cardiology

## 2023-11-09 ENCOUNTER — Other Ambulatory Visit: Payer: Self-pay | Admitting: *Deleted

## 2023-11-09 VITALS — BP 136/80 | HR 83 | Ht 64.0 in | Wt 284.2 lb

## 2023-11-09 DIAGNOSIS — R7989 Other specified abnormal findings of blood chemistry: Secondary | ICD-10-CM

## 2023-11-09 DIAGNOSIS — R0789 Other chest pain: Secondary | ICD-10-CM | POA: Diagnosis not present

## 2023-11-09 DIAGNOSIS — R112 Nausea with vomiting, unspecified: Secondary | ICD-10-CM

## 2023-11-09 NOTE — Patient Instructions (Signed)
 Medication Instructions:  Your physician recommends that you continue on your current medications as directed. Please refer to the Current Medication list given to you today.  *If you need a refill on your cardiac medications before your next appointment, please call your pharmacy*  Lab Work: NONE   If you have labs (blood work) drawn today and your tests are completely normal, you will receive your results only by: MyChart Message (if you have MyChart) OR A paper copy in the mail If you have any lab test that is abnormal or we need to change your treatment, we will call you to review the results.  Testing/Procedures: NONE   Follow-Up: At [facility], you and your health needs are our priority.  As part of our continuing mission to provide you with exceptional heart care, our providers are all part of one team.  This team includes your primary Cardiologist (physician) and Advanced Practice Providers or APPs (Physician Assistants and Nurse Practitioners) who all work together to provide you with the care you need, when you need it.  Your next appointment:   1 year(s)  Provider:   You may see Dina Rich, MD or one of the following Advanced Practice Providers on your designated Care Team:   Randall An, PA-C  [place], [place] Jacolyn Reedy, [place]     We recommend signing up for the patient portal called "MyChart".  Sign up information is provided on this After Visit Summary.  MyChart is used to connect with patients for Virtual Visits (Telemedicine).  Patients are able to view lab/test results, encounter notes, upcoming appointments, etc.  Non-urgent messages can be sent to your provider as well.   To learn more about what you can do with MyChart, go to [email or web address].   Other Instructions Thank you for choosing [facility]!

## 2023-11-09 NOTE — Progress Notes (Signed)
 Clinical Summary Mr. Hajduk is a 33 y.o.adult seen today for follow up of the following medical problems.    1. Chest pain   From prior visit:  - on and off for several years. Occurs approx 1-2 times a day. Feels increased frequency and severity over the last year. Squeezing feeling left side, 5/10. Can occur at rest or with exertion. No other associated symptoms. Not positional. Pain lasts up to 10 minutes. Nothing makes pain better. - seen in ER 12/2013 with chest pain. Cosntant 8/10 left sided, lasted 5 hours, worst with position. 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 She reports a long hospitalization at [facility] in [place], [place]. She states she was treated with prednisone at the time for ongoing chest pain due to pericarditis and joint pain, pain somewhat improved with predinsone. - from notes had chest pain at that time, trop of 2.4, non-specific EKG changes but clinical symptoms. CT PE negative. +rub on exam. Symptoms improved with NSAIDs and steroids    -echo 02/2021 LVEF 55-60%, no WMAs, no effusion  - episode 2 weeks ago. Left sided, pressure and aching pain. 4/10 in severity. Occurred while driving. Hot, sweaty, nauseous, SOB. Pulled over and called 911. HR was in the 140s on her eval.  Evaluated by EMS, HR in 90s. Took some deep breaths to calm himself down. Episode laste about 15 minutes.  -2023 coronary CTA: no significant CAD - 11/2021 LVEF 60-65%,  Chest pains in general better with klonopin    2. Still's disease - followed by Dr Karie Mainland at [facility], rheum history reviewed from his last note 04/01/14 which appears to have been there first visit together. - hx of juvenile RA diagnosed age 21, previously on prednisone, methotrexate in the past. Due to weight gain she has been  resistant to continued predisone.   - followed at [facility]      3. N/V/dysphagia - followed by GI - plans are for EGD   Past Medical History:  Diagnosis Date   ADHD    Anxiety attack    panic attacks   Back pain    Bipolar disorder (HCC)    Chest pain    Constipation    Depression    Drug use    Food allergy    Gallbladder problem    GERD (gastroesophageal reflux disease)    Headache    High blood pressure    History of heart attack    History of stomach ulcers    based on symptoms, around age 80   HLD (hyperlipidemia)    Hypothyroidism    Joint pain    Myocardial infarction [facility]) 2013   age 34- patient reports it was stopped with nitroglycerine   Osteoarthritis    Pericarditis    RA (rheumatoid arthritis) (HCC)    Sleep apnea    SOB (shortness of breath)    Still disease, 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HCC)    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HCC)    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HCC)    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HCC)    Swallowing difficulty    Vitamin D deficiency      Allergies  Allergen Reactions   Strawberry Extract Itching and Rash     Current Outpatient Medications  Medication Sig Dispense Refill   amitriptyline (ELAVIL) 100 MG tablet Take 100 mg by  mouth at bedtime.     ARIPiprazole (ABILIFY) 15 MG tablet Take 15 mg by mouth at bedtime.     busPIRone (BUSPAR) 5 MG tablet Take 5 mg by mouth 2 (two) times daily.     clonazePAM (KLONOPIN) 0.5 MG tablet Take 0.5 mg by mouth daily as needed.     cloNIDine (CATAPRES) 0.2 MG tablet Take 0.2 mg by mouth at bedtime.     fluticasone (FLONASE) 50 MCG/ACT nasal spray Place 2 sprays into both nostrils daily. 16 g 6   gabapentin (NEURONTIN) 300 MG capsule Take 300 mg by mouth 3 (three) times daily.     ibuprofen (ADVIL) 200 MG tablet Take 800 mg by mouth every 6 (six) hours as needed (For leg pain).     levothyroxine (SYNTHROID) 25 MCG tablet Take 1 tablet (25 mcg total) by mouth daily. 90 tablet 1   mirtazapine (REMERON) 30 MG tablet Take 30 mg by mouth at  bedtime.     omeprazole (PRILOSEC) 40 MG capsule Take 1 capsule (40 mg total) by mouth 2 (two) times daily before a meal. 60 capsule 3   ondansetron (ZOFRAN) 4 MG tablet Take 1 tablet (4 mg total) by mouth every 8 (eight) hours as needed for nausea or vomiting. 60 tablet 1   ondansetron (ZOFRAN-ODT) 4 MG disintegrating tablet Take 4 mg by mouth 2 (two) times daily.     rosuvastatin (CRESTOR) 10 MG tablet Take 1 tablet (10 mg total) by mouth daily. 90 tablet 3   testosterone cypionate (DEPOTESTOSTERONE CYPIONATE) 200 MG/ML injection Inject into the skin. 0.3mg  once a week     traMADol (ULTRAM) 50 MG tablet Take 50 mg by mouth daily.     TUBERCULIN SYR 1CC/27GX1/2" 27G X 1/2" 1 ML MISC Use 1 each once a week for 84 days     Vitamin D, Ergocalciferol, (DRISDOL) 1.25 MG (50000 UNIT) CAPS capsule Take 1 capsule (50,000 Units total) by mouth every 7 (seven) days. 5 capsule 0   No current facility-administered medications for this visit.     Past Surgical History:  Procedure Laterality Date   BALLOON DILATION N/A 12/22/2022   Procedure: BALLOON DILATION;  Surgeon: Lanelle Bal, DO;  [facility];  Service: Endoscopy;  Laterality: N/A;   CHOLECYSTECTOMY N/A 08/29/2021   Procedure: LAPAROSCOPIC CHOLECYSTECTOMY WITH ICG;  Surgeon: Andria Meuse, MD;  Location: [facility];  Service: General;  Laterality: N/A;   ESOPHAGOGASTRODUODENOSCOPY (EGD) WITH PROPOFOL N/A 12/22/2022   Surgeon: Lanelle Bal, DO;   Small hiatal hernia, mild Schatzki's ring dilated, gastritis biopsied.  Path benign.   WISDOM TOOTH EXTRACTION       Allergies  Allergen Reactions   Strawberry Extract Itching and Rash      Family History  Problem Relation Age of Onset   Heart disease Mother    Heart attack Mother    High blood pressure Mother    Sudden death Mother    Depression Father    Rheum arthritis Father    Hypertension Father    Bipolar disorder Brother    Colon cancer Neg Hx       Social History Mr. Bosshart reports that he quit smoking about 3 years ago. His smoking use included cigarettes and e-cigarettes. He started smoking about 14 years ago. He has a 10.4 pack-year smoking history. He has been exposed to tobacco smoke. He has never used smokeless tobacco. Mr. Zahradnik reports that he does not currently use alcohol.  Physical Examination Today's Vitals   11/09/23 0917  BP: 136/80  Pulse: 83  SpO2: 97%  Weight: 284 lb 3.2 oz (128.9 kg)  Height: 5\' 4"  (1.626 m)  PainSc: 4   PainLoc: Hip   Body mass index is 48.78 kg/m.  Gen: resting comfortably, no acute distress HEENT: no scleral icterus, pupils equal round and reactive, no palptable cervical adenopathy,  CV: RRR, no mrg, no jvd Resp: Clear to auscultation bilaterally GI: abdomen is soft, non-tender, non-distended, normal bowel sounds, no hepatosplenomegaly MSK: extremities are warm, no edema.  Skin: warm, no rash Neuro:  no focal deficits Psych: appropriate affect   Diagnostic Studies  04/29/14 Echo  Study Conclusions  - Left ventricle: The cavity size was normal. Wall thickness was increased in a pattern of mild LVH. Systolic function was normal. The estimated ejection fraction was in the range of 60% to 65%. Wall motion was normal; there were no regional wall motion abnormalities. Left ventricular diastolic function parameters were normal. - Mitral valve: There was trivial regurgitation.    11/2012 Echo [facility] LVEF 55-60%, no WMAs, no effusion     Assessment and Plan  1. Atypical chest pain - long history of left sided chest pain, has had a prior episode of pericarditis in the past - longterm intermittent symptoms have thought to be inflammatory, perhaps related to her Still's disease. Also history of anxiety and panic attacks that have been associated with symptoms - 2023 coronary CTA was benign - do not suspect any cardiac etiology of symptoms at this time, continue to  monitor - EKG today shows SR, no ischemic changes        Antoine Poche, M.D.

## 2023-11-09 NOTE — Telephone Encounter (Signed)
 I have changed it to US abdomen complete. He will go on 11/16/23.

## 2023-11-13 ENCOUNTER — Encounter (HOSPITAL_COMMUNITY)
Admission: RE | Admit: 2023-11-13 | Discharge: 2023-11-13 | Disposition: A | Discharge status: Home / Self Care | Source: Ambulatory Visit | Attending: Internal Medicine | Admitting: Internal Medicine

## 2023-11-13 ENCOUNTER — Encounter (HOSPITAL_COMMUNITY): Payer: Self-pay

## 2023-11-13 VITALS — BP 136/80 | HR 83 | Temp 98.2°F | Resp 18 | Ht 64.0 in | Wt 284.2 lb

## 2023-11-13 DIAGNOSIS — Z01818 Encounter for other preprocedural examination: Secondary | ICD-10-CM

## 2023-11-13 NOTE — Patient Instructions (Signed)
 [name]  11/13/2023     @PREFPERIOPPHARMACY @   Your procedure is scheduled on 11/19/2023.   Report to [facility] at 11:30 A.M.   Call this number if you have problems the morning of surgery:  [phone]  If you experience any cold or flu symptoms such as cough, fever, chills, shortness of breath, etc. between now and your scheduled surgery, please notify us  at the above number.   Remember:   Please follow the diet instructions given to you by Dr Cherisse Cornell office.    You may drink clear liquids until 11:30 AM .  Clear liquids allowed are:                    Water, Juice (No red color; non-citric and without pulp; diabetics please choose diet or no sugar options), Carbonated beverages (diabetics please choose diet or no sugar options), Clear Tea (No creamer, milk, or cream, including half & half and powdered creamer), Black Coffee Only (No creamer, milk or cream, including half & half and powdered creamer), Plain Jell-O Only (No red color; diabetics please choose no sugar options), Clear Sports drink (No red color; diabetics please choose diet or no sugar options), and Plain Popsicles Only (No red color; diabetics please choose no sugar options)    Take these medicines the morning of surgery with A SIP OF WATER : Buspirone Clonazepam Gabapentin Levothyroxine Omeprazole Tramadol and Zofan (if Needed)     Do not wear jewelry, make-up or nail polish, including gel polish,  artificial nails, or any other type of covering on natural nails (fingers and  toes).  Do not wear lotions, powders, or perfumes, or deodorant.  Do not shave 48 hours prior to surgery.  Men may shave face and neck.  Do not bring valuables to the hospital.  [facility] is not responsible for any belongings or valuables.  Contacts, dentures or bridgework may not be worn into surgery.  Leave your suitcase in the car.  After surgery it may be brought to your room.  For patients admitted to the hospital, discharge  time will be determined by your treatment team.  Patients discharged the day of surgery will not be allowed to drive home.   Name and phone number of your driver:   Family  Special instructions:  N/A  Please read over the following fact sheets that you were given.  Care and Recovery After Surgery   Upper Endoscopy, Adult Upper endoscopy is a procedure to look inside the upper GI (gastrointestinal) tract. The upper GI tract is made up of: The esophagus. This is the part of the body that moves food from your mouth to your stomach. The stomach. The duodenum. This is the first part of your small intestine. This procedure is also called esophagogastroduodenoscopy (EGD) or gastroscopy. In this procedure, your health care provider passes a thin, flexible tube (endoscope) through your mouth and down your esophagus into your stomach and into your duodenum. A small camera is attached to the end of the tube. Images from the camera appear on a monitor in the exam room. During this procedure, your health care provider may also remove a small piece of tissue to be sent to a lab and examined under a microscope (biopsy). Your health care provider may do an upper endoscopy to diagnose cancers of the upper GI tract. You may also have this procedure to find the cause of other conditions, such as: Stomach pain. Heartburn. Pain or problems  when swallowing. Nausea and vomiting. Stomach bleeding. Stomach ulcers. Tell a health care provider about: Any allergies you have. All medicines you are taking, including vitamins, herbs, eye drops, creams, and over-the-counter medicines. Any problems you or family members have had with anesthetic medicines. Any bleeding problems you have. Any surgeries you have had. Any medical conditions you have. Whether you are pregnant or may be pregnant. What are the risks? Your healthcare provider will talk with you about risks. These may  include: Infection. Bleeding. Allergic reactions to medicines. A tear or hole (perforation) in the esophagus, stomach, or duodenum. What happens before the procedure? When to stop eating and drinking Follow instructions from your health care provider about what you may eat and drink. These may include: 8 hours before your procedure Stop eating most foods. Do not eat meat, fried foods, or fatty foods. Eat only light foods, such as toast or crackers. All liquids are okay except energy drinks and alcohol. 6 hours before your procedure Stop eating. Drink only clear liquids, such as water, clear fruit juice, black coffee, plain tea, and sports drinks. Do not drink energy drinks or alcohol. 2 hours before your procedure Stop drinking all liquids. You may be allowed to take medicines with small sips of water. If you do not follow your health care provider's instructions, your procedure may be delayed or canceled. Medicines Ask your health care provider about: Changing or stopping your regular medicines. This is especially important if you are taking diabetes medicines or blood thinners. Taking medicines such as aspirin and ibuprofen. These medicines can thin your blood. Do not take these medicines unless your health care provider tells you to take them. Taking over-the-counter medicines, vitamins, herbs, and supplements. General instructions If you will be going home right after the procedure, plan to have a responsible adult: Take you home from the hospital or clinic. You will not be allowed to drive. Care for you for the time you are told. What happens during the procedure?  An IV will be inserted into one of your veins. You may be given one or more of the following: A medicine to help you relax (sedative). A medicine to numb the throat (local anesthetic). You will lie on your left side on an exam table. Your health care provider will pass the endoscope through your mouth and down your  esophagus. Your health care provider will use the scope to check the inside of your esophagus, stomach, and duodenum. Biopsies may be taken. The endoscope will be removed. The procedure may vary among health care providers and hospitals. What happens after the procedure? Your blood pressure, heart rate, breathing rate, and blood oxygen level will be monitored until you leave the hospital or clinic. When your throat is no longer numb, you may be given some fluids to drink. If you were given a sedative during the procedure, it can affect you for several hours. Do not drive or operate machinery until your health care provider says that it is safe. It is up to you to get the results of your procedure. Ask your health care provider, or the department that is doing the procedure, when your results will be ready. Contact a health care provider if you: Have a sore throat that lasts longer than 1 day. Have a fever. Get help right away if you: Vomit blood or your vomit looks like coffee grounds. Have bloody, black, or tarry stools. Have a very bad sore throat or you cannot swallow. Have difficulty breathing  or very bad pain in your chest or abdomen. These symptoms may be an emergency. Get help right away. Call 911. Do not wait to see if the symptoms will go away. Do not drive yourself to the hospital. Summary Upper endoscopy is a procedure to look inside the upper GI tract. During the procedure, an IV will be inserted into one of your veins. You may be given a medicine to help you relax. The endoscope will be passed through your mouth and down your esophagus. Follow instructions from your health care provider about what you can eat and drink. This information is not intended to replace advice given to you by your health care provider. Make sure you discuss any questions you have with your health care provider. Document Revised: 10/26/2021 Document Reviewed: 10/26/2021 Elsevier Patient Education   2024 [employer]  Monitored Anesthesia Care Anesthesia refers to the techniques, procedures, and medicines that help a person stay safe and comfortable during surgery. Monitored anesthesia care, or sedation, is one type of anesthesia. You may have sedation if you do not need to be asleep for your procedure. Procedures that use sedation may include: Surgery to remove cataracts from your eyes. A dental procedure. A biopsy. This is when a tissue sample is removed and looked at under a microscope. You will be watched closely during your procedure. Your level of sedation or type of anesthesia may be changed to fit your needs. Tell a health care provider about: Any allergies you have. All medicines you are taking, including vitamins, herbs, eye drops, creams, and over-the-counter medicines. Any problems you or family members have had with anesthesia. Any bleeding problems you have. Any surgeries you have had. Any medical conditions or illnesses you have. This includes sleep apnea, cough, fever, or the flu. Whether you are pregnant or may be pregnant. Whether you use cigarettes, alcohol, or drugs. Any use of steroids, whether by mouth or as a cream. What are the risks? Your health care provider will talk with you about risks. These may include: Getting too much medicine (oversedation). Nausea. Allergic reactions to medicines. Trouble breathing. If this happens, a breathing tube may be used to help you breathe. It will be removed when you are awake and breathing on your own. Heart trouble. Lung trouble. Confusion that gets better with time (emergence delirium). What happens before the procedure? When to stop eating and drinking Follow instructions from your health care provider about what you may eat and drink. These may include: 8 hours before your procedure Stop eating most foods. Do not eat meat, fried foods, or fatty foods. Eat only light foods, such as toast or crackers. All liquids  are okay except energy drinks and alcohol. 6 hours before your procedure Stop eating. Drink only clear liquids, such as water, clear fruit juice, black coffee, plain tea, and sports drinks. Do not drink energy drinks or alcohol. 2 hours before your procedure Stop drinking all liquids. You may be allowed to take medicines with small sips of water. If you do not follow your health care provider's instructions, your procedure may be delayed or canceled. Medicines Ask your health care provider about: Changing or stopping your regular medicines. These include any diabetes medicines or blood thinners you take. Taking medicines such as aspirin and ibuprofen. These medicines can thin your blood. Do not take them unless your health care provider tells you to. Taking over-the-counter medicines, vitamins, herbs, and supplements. Testing You may have an exam or testing. You may have a  blood or urine sample taken. General instructions Do not use any products that contain nicotine or tobacco for at least 4 weeks before the procedure. These products include cigarettes, chewing tobacco, and vaping devices, such as e-cigarettes. If you need help quitting, ask your health care provider. If you will be going home right after the procedure, plan to have a responsible adult: Take you home from the hospital or clinic. You will not be allowed to drive. Care for you for the time you are told. What happens during the procedure?  Your blood pressure, heart rate, breathing, level of pain, and blood oxygen level will be monitored. An IV will be inserted into one of your veins. You may be given: A sedative. This helps you relax. Anesthesia. This will: Numb certain areas of your body. Make you fall asleep for surgery. You will be given medicines as needed to keep you comfortable. The more medicine you are given, the deeper your level of sedation will be. Your level of sedation may be changed to fit your needs.  There are three levels of sedation: Mild sedation. At this level, you may feel awake and relaxed. You will be able to follow directions. Moderate sedation. At this level, you will be sleepy. You may not remember the procedure. Deep sedation. At this level, you will be asleep. You will not remember the procedure. How you get the medicines will depend on your age and the procedure. They may be given as: A pill. This may be taken by mouth (orally) or inserted into the rectum. An injection. This may be into a vein or muscle. A spray through the nose. After your procedure is over, the medicine will be stopped. The procedure may vary among health care providers and hospitals. What happens after the procedure? Your blood pressure, heart rate, breathing rate, and blood oxygen level will be monitored until you leave the hospital or clinic. You may feel sleepy, clumsy, or nauseous. You may not remember what happened during or after the procedure. Sedation can affect you for several hours. Do not drive or use machinery until your health care provider says that it is safe. This information is not intended to replace advice given to you by your health care provider. Make sure you discuss any questions you have with your health care provider. Document Revised: 12/12/2021 Document Reviewed: 12/12/2021 Elsevier Patient Education  2024 [employer].

## 2023-11-16 ENCOUNTER — Ambulatory Visit (HOSPITAL_COMMUNITY)

## 2023-11-16 ENCOUNTER — Ambulatory Visit (HOSPITAL_COMMUNITY): Admission: RE | Admit: 2023-11-16 | Source: Ambulatory Visit

## 2023-11-18 ENCOUNTER — Telehealth: Payer: Self-pay | Admitting: Gastroenterology

## 2023-11-18 NOTE — Telephone Encounter (Signed)
 Received labs from PCP dated 10/30/23.  CBC with normal WBC, Hgb, and platelets.  CMP remarkable for ALT 37, otherwise wnl  TSH wnl  US  is scheduled for 5/2. Will see what this shows and discuss further at follow-up appt.

## 2023-11-19 ENCOUNTER — Ambulatory Visit (HOSPITAL_COMMUNITY): Admitting: Anesthesiology

## 2023-11-19 ENCOUNTER — Ambulatory Visit (HOSPITAL_COMMUNITY)
Admission: RE | Admit: 2023-11-19 | Discharge: 2023-11-19 | Disposition: A | Discharge status: Home / Self Care | Attending: Internal Medicine | Admitting: Internal Medicine

## 2023-11-19 ENCOUNTER — Encounter (HOSPITAL_COMMUNITY)
Admission: RE | Disposition: A | Discharge status: Home / Self Care | Payer: Self-pay | Source: Home / Self Care | Attending: Internal Medicine

## 2023-11-19 ENCOUNTER — Other Ambulatory Visit: Payer: Self-pay

## 2023-11-19 ENCOUNTER — Encounter (HOSPITAL_COMMUNITY): Payer: Self-pay | Admitting: Internal Medicine

## 2023-11-19 DIAGNOSIS — R7989 Other specified abnormal findings of blood chemistry: Secondary | ICD-10-CM | POA: Diagnosis not present

## 2023-11-19 DIAGNOSIS — E785 Hyperlipidemia, unspecified: Secondary | ICD-10-CM | POA: Insufficient documentation

## 2023-11-19 DIAGNOSIS — Z87891 Personal history of nicotine dependence: Secondary | ICD-10-CM

## 2023-11-19 DIAGNOSIS — F418 Other specified anxiety disorders: Secondary | ICD-10-CM

## 2023-11-19 DIAGNOSIS — G4730 Sleep apnea, unspecified: Secondary | ICD-10-CM | POA: Diagnosis not present

## 2023-11-19 DIAGNOSIS — K449 Diaphragmatic hernia without obstruction or gangrene: Secondary | ICD-10-CM | POA: Insufficient documentation

## 2023-11-19 DIAGNOSIS — M0820 Juvenile rheumatoid arthritis with systemic onset, unspecified site: Secondary | ICD-10-CM | POA: Insufficient documentation

## 2023-11-19 DIAGNOSIS — M069 Rheumatoid arthritis, unspecified: Secondary | ICD-10-CM | POA: Diagnosis not present

## 2023-11-19 DIAGNOSIS — Z79899 Other long term (current) drug therapy: Secondary | ICD-10-CM | POA: Diagnosis not present

## 2023-11-19 DIAGNOSIS — Z9049 Acquired absence of other specified parts of digestive tract: Secondary | ICD-10-CM | POA: Diagnosis not present

## 2023-11-19 DIAGNOSIS — F640 Transsexualism: Secondary | ICD-10-CM | POA: Insufficient documentation

## 2023-11-19 DIAGNOSIS — E039 Hypothyroidism, unspecified: Secondary | ICD-10-CM | POA: Insufficient documentation

## 2023-11-19 DIAGNOSIS — F319 Bipolar disorder, unspecified: Secondary | ICD-10-CM | POA: Diagnosis not present

## 2023-11-19 DIAGNOSIS — Z8711 Personal history of peptic ulcer disease: Secondary | ICD-10-CM | POA: Insufficient documentation

## 2023-11-19 DIAGNOSIS — I10 Essential (primary) hypertension: Secondary | ICD-10-CM

## 2023-11-19 DIAGNOSIS — K219 Gastro-esophageal reflux disease without esophagitis: Secondary | ICD-10-CM | POA: Diagnosis present

## 2023-11-19 DIAGNOSIS — I252 Old myocardial infarction: Secondary | ICD-10-CM | POA: Insufficient documentation

## 2023-11-19 DIAGNOSIS — G709 Myoneural disorder, unspecified: Secondary | ICD-10-CM | POA: Diagnosis not present

## 2023-11-19 DIAGNOSIS — Z01818 Encounter for other preprocedural examination: Secondary | ICD-10-CM

## 2023-11-19 DIAGNOSIS — K2970 Gastritis, unspecified, without bleeding: Secondary | ICD-10-CM

## 2023-11-19 DIAGNOSIS — R1310 Dysphagia, unspecified: Secondary | ICD-10-CM | POA: Diagnosis present

## 2023-11-19 DIAGNOSIS — K222 Esophageal obstruction: Secondary | ICD-10-CM | POA: Insufficient documentation

## 2023-11-19 HISTORY — PX: ESOPHAGOGASTRODUODENOSCOPY: SHX5428

## 2023-11-19 HISTORY — PX: ESOPHAGEAL DILATION: SHX303

## 2023-11-19 LAB — POCT PREGNANCY, URINE: Preg Test, Ur: NEGATIVE

## 2023-11-19 SURGERY — EGD (ESOPHAGOGASTRODUODENOSCOPY)
Anesthesia: General

## 2023-11-19 MED ORDER — PROPOFOL 10 MG/ML IV BOLUS
INTRAVENOUS | Status: DC | PRN
Start: 2023-11-19 — End: 2023-11-19
  Administered 2023-11-19 (×3): 100 mg via INTRAVENOUS

## 2023-11-19 MED ORDER — LACTATED RINGERS IV SOLN: INTRAVENOUS | Status: DC | PRN

## 2023-11-19 MED ORDER — LIDOCAINE HCL (CARDIAC) PF 100 MG/5ML IV SOSY
PREFILLED_SYRINGE | INTRAVENOUS | Status: DC | PRN
  Administered 2023-11-19: 100 mg via INTRATRACHEAL

## 2023-11-19 NOTE — Op Note (Signed)
 [facility] Patient Name: [name] Procedure Date: 11/19/2023 1:47 PM MRN: [id number] Date of Birth: [birth year] Attending MD: Rolando Cliche. Mordechai April , [place], [id number] CSN: [id number] Age: 33 Admit Type: Outpatient Procedure:                Upper GI endoscopy Indications:              Epigastric abdominal pain, Dysphagia Providers:                Rolando Cliche. Mordechai April, DO, Emilee Tubb RN, RN, [employer], Kristine L. Roberta Chin, Technician Referring MD:              Medicines:                See the Anesthesia note for documentation of the                            administered medications Complications:            No immediate complications. Estimated Blood Loss:     Estimated blood loss was minimal. Procedure:                Pre-Anesthesia Assessment:                           - The anesthesia plan was to use monitored                            anesthesia care (MAC).                           After obtaining informed consent, the endoscope was                            passed under direct vision. Throughout the                            procedure, the patient's blood pressure, pulse, and                            oxygen saturations were monitored continuously. The                            [id number]-H190 ([id number]) scope was introduced through the                            mouth, and advanced to the second part of duodenum.                            The upper GI endoscopy was accomplished without                            difficulty. The patient tolerated the procedure  fairly well. Scope In: 2:03:52 PM Scope Out: 2:13:32 PM Total Procedure Duration: 0 hours 9 minutes 40 seconds  Findings:      A small hiatal hernia was present.      A mild Schatzki ring was found in the distal esophagus.      Patchy mild inflammation characterized by erythema was found in the       gastric body and in the gastric antrum. Biopsies were  taken with a cold       forceps for Helicobacter pylori testing.      The duodenal bulb, first portion of the duodenum and second portion of       the duodenum were normal.      **Patient's O2 sats decreased significantly during procedure (60s).       Promptly improved once endoscope removed and bag mask placed. Discussed       with anesthesia who recommended stopping procedure. As such, esophageal       dilation not performed today** Impression:               - Small hiatal hernia.                           - Mild Schatzki ring.                           - Gastritis. Biopsied.                           - Normal duodenal bulb, first portion of the                            duodenum and second portion of the duodenum. Moderate Sedation:      Per Anesthesia Care Recommendation:           - Patient has a contact number available for                            emergencies. The signs and symptoms of potential                            delayed complications were discussed with the                            patient. Return to normal activities tomorrow.                            Written discharge instructions were provided to the                            patient.                           - Resume previous diet.                           - Continue present medications.                           - Await pathology  results.                           - Return to GI clinic in 6 weeks.                           - Can consider repeat EGD in the future with                            esophageal dilation. Patient will need to undergo                            endotracheal intubation prior to future EGD Procedure Code(s):        --- Professional ---                           [phone], Esophagogastroduodenoscopy, flexible,                            transoral; with biopsy, single or multiple Diagnosis Code(s):        --- Professional ---                           K44.9, Diaphragmatic hernia without  obstruction or                            gangrene                           K22.2, Esophageal obstruction                           K29.70, Gastritis, unspecified, without bleeding                           R10.13, Epigastric pain                           R13.10, Dysphagia, unspecified CPT copyright 2022 American Medical Association. All rights reserved. The codes documented in this report are preliminary and upon coder review may  be revised to meet current compliance requirements. Rolando Cliche. Mordechai April, DO Rolando Cliche. Mordechai April, DO 11/19/2023 2:20:22 PM This report has been signed electronically. Number of Addenda: 0

## 2023-11-19 NOTE — Interval H&P Note (Signed)
 History and Physical Interval Note:  11/19/2023 1:12 PM  [name]  has presented today for surgery, with the diagnosis of dysphagia,early satiety,GERD.  The various methods of treatment have been discussed with the patient and family. After consideration of risks, benefits and other options for treatment, the patient has consented to  Procedure(s) with comments: EGD (ESOPHAGOGASTRODUODENOSCOPY) (N/A) - 1:45 pm, asa 3 DILATION, ESOPHAGUS (N/A) as a surgical intervention.  The patient's history has been reviewed, patient examined, no change in status, stable for surgery.  I have reviewed the patient's chart and labs.  Questions were answered to the patient's satisfaction.     Vinetta Greening

## 2023-11-19 NOTE — Discharge Instructions (Signed)
 EGD Discharge instructions Please read the instructions outlined below and refer to this sheet in the next few weeks. These discharge instructions provide you with general information on caring for yourself after you leave the hospital. Your doctor may also give you specific instructions. While your treatment has been planned according to the most current medical practices available, unavoidable complications occasionally occur. If you have any problems or questions after discharge, please call your doctor. ACTIVITY You may resume your regular activity but move at a slower pace for the next 24 hours.  Take frequent rest periods for the next 24 hours.  Walking will help expel (get rid of) the air and reduce the bloated feeling in your abdomen.  No driving for 24 hours (because of the anesthesia (medicine) used during the test).  You may shower.  Do not sign any important legal documents or operate any machinery for 24 hours (because of the anesthesia used during the test).  NUTRITION Drink plenty of fluids.  You may resume your normal diet.  Begin with a light meal and progress to your normal diet.  Avoid alcoholic beverages for 24 hours or as instructed by your caregiver.  MEDICATIONS You may resume your normal medications unless your caregiver tells you otherwise.  WHAT YOU CAN EXPECT TODAY You may experience abdominal discomfort such as a feeling of fullness or "gas" pains.  FOLLOW-UP Your doctor will discuss the results of your test with you.  SEEK IMMEDIATE MEDICAL ATTENTION IF ANY OF THE FOLLOWING OCCUR: Excessive nausea (feeling sick to your stomach) and/or vomiting.  Severe abdominal pain and distention (swelling).  Trouble swallowing.  Temperature over 101 F (37.8 C).  Rectal bleeding or vomiting of blood.    Your EGD revealed mild amount inflammation in your stomach.  I took biopsies of this to rule out infection with a bacteria called H. pylori.  Await pathology results, my  office will contact you.  You again had a mild tightening of your esophagus.  Unfortunately I was unable to dilate today as your O2 saturations dropped significantly during procedure.  These improved quickly with removal of endoscope and bag mask ventilation however.  We can consider repeat EGD in the future with endotracheal intubation prior.  Small bowel appeared normal.  Continue omeprazole  twice daily.  Follow-up in GI office in 6 weeks.  I hope you have a great rest of your week!  Rolando Cliche. Mordechai April, D.O. Gastroenterology and Hepatology [facility]

## 2023-11-19 NOTE — Anesthesia Preprocedure Evaluation (Signed)
 Anesthesia Evaluation  Patient identified by MRN, date of birth, ID band Patient awake    Reviewed: Allergy & Precautions, H&P , NPO status , Patient's Chart, lab work & pertinent test results, reviewed documented beta blocker date and time   Airway Mallampati: II  TM Distance: >3 FB Neck ROM: full    Dental no notable dental hx.    Pulmonary neg pulmonary ROS, sleep apnea , former smoker   Pulmonary exam normal breath sounds clear to auscultation       Cardiovascular Exercise Tolerance: Good hypertension, + Past MI  negative cardio ROS  Rhythm:regular Rate:Normal     Neuro/Psych  Headaches PSYCHIATRIC DISORDERS Anxiety Depression Bipolar Disorder    Neuromuscular disease negative neurological ROS  negative psych ROS   GI/Hepatic negative GI ROS, Neg liver ROS,GERD  ,,  Endo/Other  negative endocrine ROSHypothyroidism    Renal/GU negative Renal ROS  negative genitourinary   Musculoskeletal   Abdominal   Peds  Hematology negative hematology ROS (+)   Anesthesia Other Findings   Reproductive/Obstetrics negative OB ROS                             Anesthesia Physical Anesthesia Plan  ASA: 3  Anesthesia Plan: General   Post-op Pain Management:    Induction:   PONV Risk Score and Plan: Propofol  infusion  Airway Management Planned:   Additional Equipment:   Intra-op Plan:   Post-operative Plan:   Informed Consent: I have reviewed the patients History and Physical, chart, labs and discussed the procedure including the risks, benefits and alternatives for the proposed anesthesia with the patient or authorized representative who has indicated his/her understanding and acceptance.     Dental Advisory Given  Plan Discussed with: CRNA  Anesthesia Plan Comments:        Anesthesia Quick Evaluation

## 2023-11-19 NOTE — Transfer of Care (Signed)
 Immediate Anesthesia Transfer of Care Note  Patient: [name]  Procedure(s) Performed: EGD (ESOPHAGOGASTRODUODENOSCOPY) DILATION, ESOPHAGUS  Patient Location: PACU  Anesthesia Type:General  Level of Consciousness: awake, alert , oriented, and patient cooperative  Airway & Oxygen Therapy: Patient Spontanous Breathing and Patient connected to face mask oxygen  Post-op Assessment: Report given to RN, Post -op Vital signs reviewed and stable, and Patient moving all extremities X 4  Post vital signs: Reviewed and stable  Last Vitals:  Vitals Value Taken Time  BP 68/58 11/19/23 1430  Temp 36.6 C 11/19/23 1425  Pulse 98 11/19/23 1431  Resp 15 11/19/23 1431  SpO2 96 % 11/19/23 1431  Vitals shown include unfiled device data.  Last Pain:  Vitals:   11/19/23 1425  TempSrc:   PainSc: 0-No pain         Complications: No notable events documented.

## 2023-11-20 ENCOUNTER — Encounter (HOSPITAL_COMMUNITY): Payer: Self-pay | Admitting: Internal Medicine

## 2023-11-21 ENCOUNTER — Telehealth: Payer: Self-pay | Admitting: Gastroenterology

## 2023-11-21 ENCOUNTER — Telehealth: Payer: Self-pay | Admitting: Cardiology

## 2023-11-21 LAB — SURGICAL PATHOLOGY

## 2023-11-21 NOTE — Anesthesia Postprocedure Evaluation (Signed)
 Anesthesia Post Note  Patient: [name]  Procedure(s) Performed: EGD (ESOPHAGOGASTRODUODENOSCOPY) DILATION, ESOPHAGUS  Patient location during evaluation: Phase II Anesthesia Type: General Level of consciousness: awake Pain management: pain level controlled Vital Signs Assessment: post-procedure vital signs reviewed and stable Respiratory status: spontaneous breathing and respiratory function stable Cardiovascular status: blood pressure returned to baseline and stable Postop Assessment: no headache and no apparent nausea or vomiting Anesthetic complications: no Comments: Late entry   No notable events documented.   Last Vitals:  Vitals:   11/19/23 1425 11/19/23 1433  BP:  91/76  Pulse: (!) 106 96  Resp: 20 14  Temp: 36.6 C 36.6 C  SpO2: 93% 95%    Last Pain:  Vitals:   11/20/23 1357  TempSrc:   PainSc: 0-No pain                 Coretha Dew

## 2023-11-21 NOTE — Telephone Encounter (Signed)
 Message from Endo to schedule for 6 week f/u appt.  Called pt and left a message to call office back to schedule

## 2023-11-21 NOTE — Telephone Encounter (Signed)
   Pre-operative Risk Assessment    Patient Name: [name]  DOB: [birth year] MRN: [id number]      Request for Surgical Clearance    Procedure:   Bariatric Surgery  Date of Surgery:  Clearance TBD                                 Surgeon:  Geryl Kudo, MD Surgeon's Group or Practice Name:  [facility] Phone number:  [phone] Fax number:  [phone] attn: Ola Berger, CMA   Type of Clearance Requested:   - Medical  - Pharmacy:  Hold if applicable      Type of Anesthesia:  General    Additional requests/questions:    SignedVesta Gourd   11/21/2023, 12:40 PM

## 2023-11-21 NOTE — Telephone Encounter (Signed)
 Ok to proceed with surgery from cardiac standpoint  [name] MD

## 2023-11-21 NOTE — Telephone Encounter (Signed)
   Patient Name: [name]  DOB: 01/09/1991 MRN: [id number]  Primary Cardiologist: Armida Lander, MD  Chart reviewed as part of pre-operative protocol coverage. Given past medical history and time since last visit, based on ACC/AHA guidelines, [name] is at acceptable risk for the planned procedure without further cardiovascular testing. Per Dr. Amanda Jungling patient ok to proceed with surgery from cardiac standpoint.   I will route this recommendation to the requesting party via Epic fax function and remove from pre-op pool.  Please call with questions.  Malin Cervini D Tehilla Coffel, NP 11/21/2023, 1:19 PM

## 2023-11-22 ENCOUNTER — Other Ambulatory Visit (HOSPITAL_COMMUNITY): Payer: Self-pay | Admitting: General Surgery

## 2023-11-30 ENCOUNTER — Encounter (HOSPITAL_COMMUNITY): Payer: Self-pay

## 2023-11-30 ENCOUNTER — Ambulatory Visit (HOSPITAL_COMMUNITY): Admission: RE | Admit: 2023-11-30 | Source: Ambulatory Visit

## 2023-12-04 ENCOUNTER — Ambulatory Visit (HOSPITAL_COMMUNITY): Payer: Self-pay | Admitting: Licensed Clinical Social Worker

## 2023-12-04 DIAGNOSIS — F3132 Bipolar disorder, current episode depressed, moderate: Secondary | ICD-10-CM | POA: Diagnosis not present

## 2023-12-04 DIAGNOSIS — F411 Generalized anxiety disorder: Secondary | ICD-10-CM

## 2023-12-04 NOTE — Progress Notes (Signed)
 1
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
## 2023-12-06 ENCOUNTER — Ambulatory Visit (HOSPITAL_COMMUNITY)
Admission: RE | Admit: 2023-12-06 | Discharge: 2023-12-06 | Disposition: A | Discharge status: Home / Self Care | Source: Ambulatory Visit | Attending: General Surgery | Admitting: General Surgery

## 2023-12-07 ENCOUNTER — Encounter: Payer: Self-pay | Admitting: Dietician

## 2023-12-07 ENCOUNTER — Encounter: Attending: Physician Assistant | Admitting: Dietician

## 2023-12-07 VITALS — Ht 64.0 in | Wt 292.7 lb

## 2023-12-07 DIAGNOSIS — Z713 Dietary counseling and surveillance: Secondary | ICD-10-CM | POA: Insufficient documentation

## 2023-12-07 DIAGNOSIS — Z6841 Body Mass Index (BMI) 40.0 and over, adult: Secondary | ICD-10-CM | POA: Insufficient documentation

## 2023-12-07 DIAGNOSIS — E669 Obesity, unspecified: Secondary | ICD-10-CM

## 2023-12-07 NOTE — Progress Notes (Signed)
 Nutrition Assessment for Bariatric Surgery: Pre-Surgery Behavioral and Nutrition Intervention Program   Medical Nutrition Therapy  Appt Start Time: 1113    End Time: 1212  Patient was seen on 12/07/2023 for Pre-Operative Nutrition Assessment. Purpose of todays visit  enhance perioperative outcomes along with a healthy weight maintenance   Referral stated Supervised Weight Loss (SWL) visits needed: 6 Months  Planned surgery: RYGB Pt expectation of surgery: To be about 160 lbs; main goal to be 220 to get hip surgery  NUTRITION ASSESSMENT   Anthropometrics  Start weight at [facility]: 292.7 lbs (date: 12/07/2023)  Height: 64 in BMI: 50.24 kg/m2     Clinical   Pharmacotherapy: History of weight loss medication used: phentermine (lost 50 pound first time)  Medical hx: hypercholesterolemia, GERD, obesity, sleep apnea Medications: omeprazole , levothyroxine , Crestor , amitriptyline , Abilify , metazoprine   Labs: no recent labs in EMR Notable signs/symptoms: walking with pain (hips) Any previous deficiencies? No  Evaluation of Nutritional Deficiencies: Micronutrient Nutrition Focused Physical Exam: Hair: No issues observed Eyes: No issues observed Mouth: No issues observed Neck: No issues observed Nails: No issues observed Skin: No issues observed  Lifestyle & Dietary Hx  Pt states he will get hip replacement when he loses weight.  Pt states he had an endoscopy done recently. Pt states he is allergic to strawberry extract (artificial). Pt states he eats a lot of fast food.  Current Physical Activity Recommendations state 150 minutes per week of moderate to vigorous movement including Cardio and 1-2 days of resistance activities as well as flexibility/balance activities:  Pts current physical activity: stretching, with 0% recommendation reached   Sleep Hygiene: duration and quality: uses CPAP machine; big difference when using it, stating good sleep.  Current Patient Perceived Stress  Level as stated by pt on a scale of 1-10:  7       Stress Management Techniques: used to use mariajuana, but not anymore.  According to the Dietary Guidelines for Americans Recommendation: equivalent 1.5-2 cups fruits per day, equivalent 2-3 cups vegetables per day and at least half all grains whole  Fruit servings per day (on average): 0, meeting 0% recommendation  Non-starchy vegetable servings per day (on average): 0, meeting 0% recommendation  Whole Grains per day (on average): 1  Number of meals missed/skipped per week out of 21: 7  24-Hr Dietary Recall First Meal: skip Snack:  Second Meal: [employer] mc double and sweet tea or protein shake Snack:  Third Meal: pizza or other fast food Snack: little debbie cakes (once a day) Beverages: sweet tea, diet pepsi, water  Alcoholic beverages per week: 0   Estimated Energy Needs Calories: 1500  NUTRITION DIAGNOSIS  Overweight/obesity ([place]-3.3) related to past poor dietary habits and physical inactivity as evidenced by patient w/ planned RYGB surgery following dietary guidelines for continued weight loss.  NUTRITION INTERVENTION  Nutrition counseling (C-1) and education (E-2) to facilitate bariatric surgery goals.  Educated pt on micronutrient deficiencies post-surgery and behavioral/dietary strategies to start in order to mitigate that risk   Behavioral and Dietary Interventions Pre-Op Goals Reviewed with the Patient Nutrition: Healthy Eating Behaviors Switch to non-caloric, non-carbonated and non-caffeinated beverages such as  water, unsweetened tea, Crystal Light and zero calorie beverages (aim for 64 oz. per day) Cut out grazing between meals or at night  Find a protein shake you like Eat every 3-5 hours        Eliminate distractions while eating (TV, computer, reading, driving, texting) Take 16-10 minutes to eat a meal  Decrease high sugar foods/decrease high fat/fried foods Eliminate alcoholic beverages Increase protein  intake (eggs, fish, chicken, yogurt) before surgery Eat non starchy vegetables 2 times a day 7 days a week Eat complex carbohydrates such as whole grains and fruits   Behavioral Modification: Physical Activity Increase my usual daily activity (use stairs, park farther, etc.) Engage in _______________________  activity  _______ minutes ______ times per week  Other:    _________________________________________________________________     Problem Solving I will think about my usual eating patterns and how to tweak them How can my friends and family support me Barriers to starting my changes Learn and understand appetite verses hunger   Healthy Coping Allow for ___________ activities per week to help me manage stress Reframe negative thoughts I will keep a picture of someone or something that is my inspiration & look at it daily   Monitoring  Weigh myself once a week  Measure my progress by monitoring how my clothes fit Keep a food record of what I eat and drink for the next ________ (time period) Take pictures of what I eat and drink for the next ________ (time period) Use an app to count steps/day for the next_______ (time period) Measure my progress such as increased energy and more restful sleep Monitor your acid reflux and bowel habits, are they getting better?   *Goals that are bolded indicate the pt would like to start working towards these  Handouts Provided Include  Bariatric Surgery handouts (Nutrition Visits, Pre Surgery Behavioral Change Goals, Protein Shakes Brands to Choose From, Vitamins & Mineral Supplementation)  Learning Style & Readiness for Change Teaching method utilized: Visual, Auditory, and hands on  Demonstrated degree of understanding via: Teach Back  Readiness Level: preparation Barriers to learning/adherence to lifestyle change: mobility  RD's Notes for Next Visit Patient progress toward chosen goals    MONITORING & EVALUATION Dietary intake, weekly  physical activity, body weight, and preoperative behavioral change goals   Next Steps  Patient is to follow up at [facility] in 2 weeks for first SWL visit.

## 2023-12-21 ENCOUNTER — Encounter: Admitting: Dietician

## 2023-12-21 ENCOUNTER — Encounter: Payer: Self-pay | Admitting: Dietician

## 2023-12-21 VITALS — Ht 64.0 in | Wt 286.3 lb

## 2023-12-21 DIAGNOSIS — Z713 Dietary counseling and surveillance: Secondary | ICD-10-CM | POA: Diagnosis not present

## 2023-12-21 DIAGNOSIS — E669 Obesity, unspecified: Secondary | ICD-10-CM

## 2023-12-21 NOTE — Progress Notes (Signed)
 Supervised Weight Loss Visit Bariatric Nutrition Education Appt Start Time: 1000    End Time: 1024  Planned surgery: RYGB Pt expectation of surgery: To be about 160 lbs; main goal to be 220 to get hip surgery  Referral stated Supervised Weight Loss (SWL) visits needed: 6 Months  1 out of 6 SWL Appointments   NUTRITION ASSESSMENT   Anthropometrics  Start weight at [facility]: 292.7 lbs (date: 12/07/2023)  Height: 64 in Weight today: 286.3 lbs BMI: 49.14 kg/m2     Clinical   Pharmacotherapy: History of weight loss medication used: phentermine (lost 50 pound first time)  Medical hx: hypercholesterolemia, GERD, obesity, sleep apnea Medications: omeprazole , levothyroxine , Crestor , amitriptyline , Abilify , metazoprine   Labs: no recent labs in EMR Notable signs/symptoms: walking with pain (hips) Any previous deficiencies? No  Lifestyle & Dietary Hx  Pt states he started Zepbound on Tuesday. [facility] and do some upper body exercises, since his hips will not allow much else. Pt states he has containers to start meal/snack prepping, stating he just needs to start using them.  Estimated daily fluid intake: 90 oz Supplements: metamucil (to avoid constipation  Current average weekly physical activity: ADLs, stretching first thing in the morning  24-Hr Dietary Recall First Meal: protein shake Snack:  Second Meal: protein shake Snack:  Third Meal: chicken (air fried), steamed vegetables Snack: nothing since Tuesday Beverages: water, water with zero sugar liquid IV  Alcoholic beverages per week: 0   Estimated Energy Needs Calories: 1500  NUTRITION DIAGNOSIS  Overweight/obesity ([place]-3.3) related to past poor dietary habits and physical inactivity as evidenced by patient w/ planned RYGB surgery following dietary guidelines for continued weight loss.  NUTRITION INTERVENTION  Nutrition counseling (C-1) and education (E-2) to facilitate  bariatric surgery goals.  Pre-Op Goals Reviewed with the Patient  Pre-Op Goals Progress & New Goals Continue: cut out grazing between meals and at night Continue: eat every 3-5 hours; aim for a protein with every meal or snack; aim for food and less reliant on protein shakes New: use your container for meal prepping; use the bariatric MyPlate method for meal planning/prep  Handouts Provided Include  Bariatric MyPlate  Learning Style & Readiness for Change Teaching method utilized: Visual & Auditory  Demonstrated degree of understanding via: Teach Back  Readiness Level: preparation Barriers to learning/adherence to lifestyle change: mobility  RD's Notes for next Visit  Patient progress toward chosen goals  MONITORING & EVALUATION Dietary intake, weekly physical activity, body weight, and pre-op goals in 1 month.   Next Steps  Patient is to return to [facility] in 1 month for next SWL visit.

## 2023-12-28 ENCOUNTER — Ambulatory Visit: Admitting: Dietician

## 2024-01-03 ENCOUNTER — Ambulatory Visit: Admitting: Skilled Nursing Facility1

## 2024-01-04 ENCOUNTER — Emergency Department (HOSPITAL_COMMUNITY)
Admission: EM | Admit: 2024-01-04 | Discharge: 2024-01-04 | Disposition: A | Discharge status: Home / Self Care | Attending: Emergency Medicine | Admitting: Emergency Medicine

## 2024-01-04 ENCOUNTER — Emergency Department (HOSPITAL_COMMUNITY)

## 2024-01-04 ENCOUNTER — Encounter (HOSPITAL_COMMUNITY): Payer: Self-pay

## 2024-01-04 ENCOUNTER — Other Ambulatory Visit: Payer: Self-pay

## 2024-01-04 DIAGNOSIS — M25551 Pain in right hip: Secondary | ICD-10-CM | POA: Insufficient documentation

## 2024-01-04 DIAGNOSIS — W19XXXA Unspecified fall, initial encounter: Secondary | ICD-10-CM

## 2024-01-04 DIAGNOSIS — W182XXA Fall in (into) shower or empty bathtub, initial encounter: Secondary | ICD-10-CM | POA: Insufficient documentation

## 2024-01-04 DIAGNOSIS — S7001XA Contusion of right hip, initial encounter: Secondary | ICD-10-CM

## 2024-01-04 MED ORDER — NAPROXEN 500 MG PO TABS: 500.0000 mg | ORAL_TABLET | Freq: Two times a day (BID) | ORAL | 0 refills | Status: DC

## 2024-01-04 MED ORDER — OXYCODONE-ACETAMINOPHEN 5-325 MG PO TABS
1.0000 | ORAL_TABLET | Freq: Once | ORAL | Status: AC
  Administered 2024-01-04: 1 via ORAL
  Filled 2024-01-04: qty 1

## 2024-01-04 MED ORDER — METHOCARBAMOL 750 MG PO TABS: 750.0000 mg | ORAL_TABLET | Freq: Three times a day (TID) | ORAL | 0 refills | Status: DC

## 2024-01-04 NOTE — ED Triage Notes (Addendum)
 Pt bib ems from home complaining of left hip pain caused by fall. Also complaining of left knee pain Pt stated that they slipped getting . out of shower, endorses hitting head on faucet but denies LOC. Pt currently AAOx4. Pt also states that she is supposed to have bilateral hip surgery.

## 2024-01-04 NOTE — Discharge Instructions (Signed)
 Please follow-up closely with your primary care doctor on an outpatient basis.  Return to emergency department immediately for any new or worsening symptoms.

## 2024-01-04 NOTE — ED Provider Notes (Signed)
 [place] EMERGENCY DEPARTMENT AT [facility] Provider Note   CSN: [id number] Arrival date & time: 01/04/24  1216     History  Chief Complaint  Patient presents with   Fall   Hip Pain    [name] is a 33 y.o. adult.  Patient is a 33 year old female who presents to the emergency department with a chief complaint of left hip pain.  Patient notes that she was attempting to get out of the shower when she fell onto the left hip and knee.  Patient did strike her head but there was no associated loss of consciousness and patient had no known history of bleeding disorders or current anticoagulation use.  Patient denies any pain to neck or back.  There is no associated abdominal or chest pain.  Patient denies any long bone or joint pain.   Fall  Hip Pain       Home Medications Prior to Admission medications   Medication Sig Start Date End Date Taking? Authorizing Provider  amitriptyline  (ELAVIL ) 100 MG tablet Take 100 mg by mouth at bedtime. 10/24/23   [provider]  ARIPiprazole  (ABILIFY ) 15 MG tablet Take 15 mg by mouth at bedtime. 03/05/23   [provider]  busPIRone (BUSPAR) 5 MG tablet Take 5 mg by mouth 2 (two) times daily. 09/11/23   [provider]  clonazePAM (KLONOPIN) 0.5 MG tablet Take 0.5 mg by mouth daily as needed. 08/24/22   [provider]  cloNIDine  (CATAPRES ) 0.2 MG tablet Take 0.2 mg by mouth at bedtime. 04/09/23   [provider]  fluticasone  (FLONASE ) 50 MCG/ACT nasal spray Place 2 sprays into both nostrils daily. 08/19/23   Marciana Settle, PA-C  gabapentin (NEURONTIN) 300 MG capsule Take 300 mg by mouth 3 (three) times daily. 03/10/22   [provider]  ibuprofen  (ADVIL ) 200 MG tablet Take 800 mg by mouth every 6 (six) hours as needed (For leg pain).    [provider]  levothyroxine  (SYNTHROID ) 25 MCG tablet Take 1 tablet (25 mcg total) by mouth daily. 03/01/23   Zarwolo, Gloria, FNP   mirtazapine (REMERON) 30 MG tablet Take 30 mg by mouth at bedtime. 10/24/23   [provider]  omeprazole  (PRILOSEC) 40 MG capsule Take 1 capsule (40 mg total) by mouth 2 (two) times daily before a meal. 11/08/23   Evander Hills, PA-C  ondansetron  (ZOFRAN ) 4 MG tablet Take 1 tablet (4 mg total) by mouth every 8 (eight) hours as needed for nausea or vomiting. 11/08/23   Evander Hills, PA-C  ondansetron  (ZOFRAN -ODT) 4 MG disintegrating tablet Take 4 mg by mouth 2 (two) times daily. 07/05/22   [provider]  rosuvastatin  (CRESTOR ) 10 MG tablet Take 1 tablet (10 mg total) by mouth daily. 04/25/23   Zarwolo, Gloria, FNP  testosterone  cypionate (DEPOTESTOSTERONE CYPIONATE) 200 MG/ML injection Inject into the skin. 0.3mg  once a week 05/24/22   [provider]  traMADol  (ULTRAM ) 50 MG tablet Take 50 mg by mouth daily. 10/30/23   [provider]  TUBERCULIN SYR 1CC/27GX1/2 27G X 1/2 1 ML MISC Use 1 each once a week for 84 days 05/24/22   [provider]  Vitamin D , Ergocalciferol , (DRISDOL ) 1.25 MG (50000 UNIT) CAPS capsule Take 1 capsule (50,000 Units total) by mouth every 7 (seven) days. 09/19/22   Lorayne Rocks, DO      Allergies    Strawberry extract    Review of Systems   Review of  Systems  Musculoskeletal:        Pain to left hip and left knee  All other systems reviewed and are negative.   Physical Exam Updated Vital Signs BP 120/80   Pulse (!) 106   Temp 98.4 F (36.9 C) (Oral)   Resp 20   Ht 5' 4 (1.626 m)   Wt 129.9 kg   SpO2 100%   BMI 49.14 kg/m  Physical Exam Vitals and nursing note reviewed.  Constitutional:      Appearance: Normal appearance.  HENT:     Head: Normocephalic and atraumatic.     Nose: Nose normal.     Mouth/Throat:     Mouth: Mucous membranes are moist.  Eyes:     Extraocular Movements: Extraocular movements intact.     Conjunctiva/sclera: Conjunctivae normal.     Pupils: Pupils are equal, round, and  reactive to light.  Cardiovascular:     Rate and Rhythm: Normal rate and regular rhythm.     Pulses: Normal pulses.     Heart sounds: Normal heart sounds. No murmur heard.    No gallop.  Pulmonary:     Effort: Pulmonary effort is normal. No respiratory distress.     Breath sounds: Normal breath sounds. No stridor. No wheezing, rhonchi or rales.  Abdominal:     General: Abdomen is flat. Bowel sounds are normal. There is no distension.     Palpations: Abdomen is soft.     Tenderness: There is no abdominal tenderness. There is no guarding.  Musculoskeletal:        General: Normal range of motion.     Cervical back: Normal range of motion and neck supple. No rigidity or tenderness.     Comments: Tender palpation noted over the left hip and left knee, nontender palpation over right lower extremity throughout, nontender palpation over left ankle or foot, DP and PT pulses are 2+ distally, sensation intact distally, full range of motion noted throughout, no obvious deformity or bruising, no skin breakdown or ulceration, no lacerations or abrasions, nontender palpation over bilateral upper extremities, nontender palpation over thoracic and lumbar spine  Skin:    General: Skin is warm and dry.  Neurological:     General: No focal deficit present.     Mental Status: He is alert and oriented to person, place, and time. Mental status is at baseline.     Sensory: No sensory deficit.     Motor: No weakness.     Coordination: Coordination normal.  Psychiatric:        Mood and Affect: Mood normal.        Behavior: Behavior normal.        Thought Content: Thought content normal.        Judgment: Judgment normal.     ED Results / Procedures / Treatments   Labs (all labs ordered are listed, but only abnormal results are displayed) Labs Reviewed - No data to display  EKG None  Radiology CT Hip Left Wo Contrast Result Date: 01/04/2024 CLINICAL DATA:  Hip trauma, fracture suspected, xray done  EXAM: CT OF THE LEFT HIP WITHOUT CONTRAST TECHNIQUE: Multidetector CT imaging of the left hip was performed according to the standard protocol. Multiplanar CT image reconstructions were also generated. RADIATION DOSE REDUCTION: This exam was performed according to the departmental dose-optimization program which includes automated exposure control, adjustment of the mA and/or kV according to patient size and/or use of iterative reconstruction technique. COMPARISON:  Same day radiographs of the  left hip dated 01/04/2024. Left hip radiographs dated 04/24/2023. FINDINGS: Bones/Joint/Cartilage No acute fracture or dislocation. The left femoral head is seated within the acetabulum. Redemonstrated severe osteoarthritis of the left hip manifested by complete joint space loss resulting in bone-on-bone contact, marginal osteophytosis, subchondral sclerosis, and morphological contour flattening of the femoral head. Pubic symphysis is anatomically aligned. Ligaments Ligaments are suboptimally evaluated by CT. Muscles and Tendons Muscles are normal. No muscle atrophy. No intramuscular fluid collection or hematoma. Soft tissue No fluid collection or hematoma. No soft tissue mass. Visualized intrapelvic contents are grossly unremarkable. IMPRESSION: 1. No acute osseous abnormality. 2. Severe osteoarthritis of the left hip manifested by complete joint space loss resulting in bone-on-bone contact, marginal osteophytosis, subchondral sclerosis, and morphological contour flattening of the femoral head. Electronically Signed   By: Mannie Seek M.D.   On: 01/04/2024 15:44   DG Knee Complete 4 Views Left Result Date: 01/04/2024 CLINICAL DATA:  fall, diffuse pain EXAM: LEFT KNEE - COMPLETE 4+ VIEW COMPARISON:  None Available. FINDINGS: No acute fracture or dislocation. No joint effusion. There is no evidence of arthropathy or other focal bone abnormality. Soft tissues are unremarkable. IMPRESSION: No acute fracture or dislocation.  Electronically Signed   By: Rance Burrows M.D.   On: 01/04/2024 13:36   DG Hip Unilat W or Wo Pelvis 2-3 Views Left Result Date: 01/04/2024 CLINICAL DATA:  fall, diffuse pain EXAM: DG HIP (WITH OR WITHOUT PELVIS) 2-3V LEFT COMPARISON:  April 24, 2023 FINDINGS: The study is degraded by patient's body habitus. No evidence of pelvic fracture or diastasis.No dislocation.Severe joint space loss of both hips with bone-on-bone articulation and osteophyte formation, unchanged. No erosions. Soft tissues are unremarkable. IMPRESSION: 1. No acute fracture, pelvic bone diastasis, or dislocation. 2. 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 Electronically Signed   By: Rance Burrows M.D.   On: 01/04/2024 13:34    Procedures Procedures    Medications Ordered in ED Medications  oxyCODONE -acetaminophen  (PERCOCET/ROXICET) 5-325 MG per tablet 1 tablet (1 tablet Oral Given 01/04/24 1259)    ED Course/ Medical Decision Making/ A&P                                 Medical Decision Making Patient is doing well at this time and is stable for discharge home.  Discussed with patient that images have been unremarkable in emergency department for any signs of acute osseous injury or lesions.  Suspect contusion at this point.  Will continue symptomatic treatment outpatient basis.  Patient was nontender palpation remainder of bilateral upper and lower extremities.  Patient had no tenderness over chest wall and abdomen.  There was no tenderness over cervical, thoracic, lumbar spine.  Patient has no concern neurological deficits at this point.  Do not suspect any further advanced imaging is warranted.  Patient is otherwise low risk for intracranial hemorrhage.  Close follow-up with primary care doctor was discussed as well as strict turn precautions for any new or worsening symptoms.  Patient voiced understand to the plan and had no additional questions.  Amount and/or Complexity of Data  Reviewed Radiology: ordered.  Risk Prescription drug management.           Final Clinical Impression(s) / ED Diagnoses Final diagnoses:  None    Rx / DC Orders ED Discharge Orders     None         Roselynn Connors, PA-C  01/04/24 1627    Ninetta Basket, MD 01/09/24 1229

## 2024-01-21 ENCOUNTER — Encounter: Attending: General Surgery | Admitting: Dietician

## 2024-01-21 ENCOUNTER — Encounter: Payer: Self-pay | Admitting: Dietician

## 2024-01-21 VITALS — Ht 64.0 in | Wt 277.5 lb

## 2024-01-21 DIAGNOSIS — Z713 Dietary counseling and surveillance: Secondary | ICD-10-CM | POA: Diagnosis not present

## 2024-01-21 DIAGNOSIS — Z6841 Body Mass Index (BMI) 40.0 and over, adult: Secondary | ICD-10-CM | POA: Diagnosis not present

## 2024-01-21 DIAGNOSIS — Z79899 Other long term (current) drug therapy: Secondary | ICD-10-CM | POA: Insufficient documentation

## 2024-01-21 DIAGNOSIS — E669 Obesity, unspecified: Secondary | ICD-10-CM | POA: Insufficient documentation

## 2024-01-21 NOTE — Progress Notes (Signed)
 Supervised Weight Loss Visit Bariatric Nutrition Education Appt Start Time: 1125   End Time: 1149  Planned surgery: RYGB Pt expectation of surgery: To be about 160 lbs; main goal to be 220 to get hip surgery  Referral stated Supervised Weight Loss (SWL) visits needed: 6 Months  2 out of 6 SWL Appointments   NUTRITION ASSESSMENT   Anthropometrics  Start weight at [facility]: 292.7 lbs (date: 12/07/2023)  Height: 64 in Weight today: 277.5 lbs BMI: 47.63 kg/m2     Clinical   Pharmacotherapy: History of weight loss medication used: phentermine (lost 50 pound first time)  Medical hx: hypercholesterolemia, GERD, obesity, sleep apnea Medications: omeprazole , levothyroxine , Crestor , amitriptyline , Abilify , metazoprine   Labs: no recent labs in EMR Notable signs/symptoms: walking with pain (hips) Any previous deficiencies? No  Lifestyle & Dietary Hx  Pt states he went to the hospital due to a fall, stating he hurt his knee and needs to get an MRI. Pt states he went up on Zepbound to 5mg , stating he has not felt a difference with the increase, except feeling tired for the first couple days after taking it. Pt states he signed up at the [facility], stating he plans to start in the pool. Pt states he found some trail mix that he is using for snacks. Pt states he has tried practicing not drinking with meals or snacks. Pt states it is difficult to meal prep. Pt states he is getting some smaller containers to help with meal prepping, stating the ones he has now are too big. Pt states he has social anxiety, stating he is working with a Paramedic.  24-Hr Dietary Recall First Meal: protein shake Snack: trail mix Second Meal: protein shake Snack: trail mix Third Meal: chicken (air fried), steamed vegetables Snack: nothing since Tuesday Beverages: water, water with zero sugar liquid IV  Alcoholic beverages per week: 0   Estimated Energy Needs Calories: 1500  NUTRITION DIAGNOSIS   Overweight/obesity ([place]-3.3) related to past poor dietary habits and physical inactivity as evidenced by patient w/ planned RYGB surgery following dietary guidelines for continued weight loss.  NUTRITION INTERVENTION  Nutrition counseling (C-1) and education (E-2) to facilitate bariatric surgery goals.  Pre-Op Goals Reviewed with the Patient  Pre-Op Goals Progress & New Goals Continue: cut out grazing between meals and at night Continue: eat every 3-5 hours; aim for a protein with every meal or snack; aim for food and less reliant on protein shakes; New: track protein, aim for 60-80 grams per day Continue: use your container for meal prepping; use the bariatric MyPlate method for meal planning/prep New: Increase physical activity; aim for 2-3 days per week at the pool [facility]) 30-60 minutes and upper body weights for 30 minutes.  Handouts Provided Include  Bariatric Supplement Handout  Learning Style & Readiness for Change Teaching method utilized: Visual & Auditory  Demonstrated degree of understanding via: Teach Back  Readiness Level: preparation Barriers to learning/adherence to lifestyle change: mobility  RD's Notes for next Visit  Patient progress toward chosen goals  MONITORING & EVALUATION Dietary intake, weekly physical activity, body weight, and pre-op goals in 1 month.   Next Steps  Patient is to return to [facility] in 1 month for next SWL visit.

## 2024-02-05 ENCOUNTER — Other Ambulatory Visit: Payer: Self-pay | Admitting: Gastroenterology

## 2024-02-05 DIAGNOSIS — K219 Gastro-esophageal reflux disease without esophagitis: Secondary | ICD-10-CM

## 2024-02-25 ENCOUNTER — Encounter: Payer: Self-pay | Admitting: Dietician

## 2024-02-25 ENCOUNTER — Encounter: Attending: General Surgery | Admitting: Dietician

## 2024-02-25 VITALS — Ht 64.0 in | Wt 274.1 lb

## 2024-02-25 DIAGNOSIS — Z6841 Body Mass Index (BMI) 40.0 and over, adult: Secondary | ICD-10-CM | POA: Insufficient documentation

## 2024-02-25 DIAGNOSIS — E669 Obesity, unspecified: Secondary | ICD-10-CM | POA: Insufficient documentation

## 2024-02-25 DIAGNOSIS — Z713 Dietary counseling and surveillance: Secondary | ICD-10-CM | POA: Insufficient documentation

## 2024-02-25 NOTE — Progress Notes (Signed)
 Supervised Weight Loss Visit Bariatric Nutrition Education Appt Start Time: 9040  End Time: 1027  Planned surgery: RYGB Pt expectation of surgery: To be about 160 lbs; main goal to be 220 to get hip surgery  Referral stated Supervised Weight Loss (SWL) visits needed: 6 Months  3 out of 6 SWL Appointments   NUTRITION ASSESSMENT   Anthropometrics  Start weight at [facility]: 292.7 lbs (date: 12/07/2023)  Height: 64 in Weight today: 274.1 lbs BMI: 47.05 kg/m2     Clinical   Pharmacotherapy: History of weight loss medication used: phentermine (lost 50 pound first time)  Medical hx: hypercholesterolemia, GERD, obesity, sleep apnea Medications: omeprazole , levothyroxine , Crestor , amitriptyline , Abilify , metazoprine, Zepbound Labs: no recent labs in EMR Notable signs/symptoms: walking with pain (hips) Any previous deficiencies? No  Lifestyle & Dietary Hx  PT states he weighs about twice a week, stating Sunday and mid-week (Wednesday or Thursday). Pt states he takes Zepbound, stating he is taking the 5 mg dose. Pt states he has not had side effects, except he can not eat red meat now, stating it will sit in his stomach for ever. Pt states he does mostly chicken and want to learn to cook fish. Pt states he wants to get a meat thermometer. Pt states he uses his air fryer a lot. Pt states he wants to get a food scale to help track protein. Pt states he is looking at labels more.  Estimated daily fluid intake: 96 oz Supplements: no Current average weekly physical activity: going to the [employer] going two days per week, weight lifting 30-45 minutes  24-Hr Dietary Recall First Meal: protein shake Snack: trail mix Second Meal: protein shake Snack: trail mix Third Meal: chicken (air fried), steamed vegetables Snack: nothing since Tuesday Beverages: water, water with zero sugar liquid IV  Alcoholic beverages per week: 0   Estimated Energy Needs Calories: 1500  NUTRITION DIAGNOSIS   Overweight/obesity ([place]-3.3) related to past poor dietary habits and physical inactivity as evidenced by patient w/ planned RYGB surgery following dietary guidelines for continued weight loss.  NUTRITION INTERVENTION  Nutrition counseling (C-1) and education (E-2) to facilitate bariatric surgery goals.  Pre-Op Goals Reviewed with the Patient  Tracking protein intake before and after surgery is important. Aim for 60-80 grams per day. Adequate protein ensures the body is well-nourished and supports lean muscle mass preservation. To achieve this, reading food labels helps, noting the protein content per serving size. For protein sources like meat, a useful visual guide is to consider a portion roughly the size and thickness of a deck of cards or the palm of your hand as approximately 3 ounces. With an average of 7 grams of protein per ounce of meat, this translates to about 21 grams of protein per 3-ounce serving, facilitating accurate self-tracking and ensuring you meet your pre-surgical and post-surgical protein goals.  Pre-Op Goals Progress & New Goals Continue: cut out grazing between meals and at night; schedule meals and snacks Continue: eat every 3-5 hours; aim for a protein with every meal or snack; aim for food and less reliant on protein shakes; Re-engage: track protein, aim for 60-80 grams per day Continue: use your container for meal prepping; use the bariatric MyPlate method for meal planning/prep Re-engage: Increase physical activity; aim for 2-3 days per week at the pool [facility]) 30-60 minutes and upper body weights for 30 minutes.  Handouts Provided Include  Patient goals printed  Learning Style & Readiness for Change Teaching method utilized: Visual & Auditory  Demonstrated degree of understanding via: Teach Back  Readiness Level: preparation Barriers to learning/adherence to lifestyle change: mobility  RD's Notes for next Visit  Patient progress toward chosen  goals  MONITORING & EVALUATION Dietary intake, weekly physical activity, body weight, and pre-op goals in 1 month.   Next Steps  Patient is to return to [facility] in 1 month for next SWL visit.

## 2024-03-11 ENCOUNTER — Other Ambulatory Visit: Payer: Self-pay | Admitting: Gastroenterology

## 2024-03-11 DIAGNOSIS — K219 Gastro-esophageal reflux disease without esophagitis: Secondary | ICD-10-CM

## 2024-03-27 ENCOUNTER — Encounter: Payer: Self-pay | Admitting: Dietician

## 2024-03-27 ENCOUNTER — Encounter: Attending: General Surgery | Admitting: Dietician

## 2024-03-27 VITALS — Ht 64.0 in | Wt 273.5 lb

## 2024-03-27 DIAGNOSIS — E669 Obesity, unspecified: Secondary | ICD-10-CM | POA: Diagnosis present

## 2024-03-27 DIAGNOSIS — Z713 Dietary counseling and surveillance: Secondary | ICD-10-CM | POA: Diagnosis not present

## 2024-03-27 DIAGNOSIS — Z6841 Body Mass Index (BMI) 40.0 and over, adult: Secondary | ICD-10-CM | POA: Diagnosis not present

## 2024-03-27 NOTE — Progress Notes (Signed)
 Supervised Weight Loss Visit Bariatric Nutrition Education Appt Start Time: 9052  End Time: 1006  Planned surgery: RYGB Pt expectation of surgery: To be about 160 lbs; main goal to be 220 to get hip surgery  Referral stated Supervised Weight Loss (SWL) visits needed: 6 Months  4 out of 6 SWL Appointments   NUTRITION ASSESSMENT   Anthropometrics  Start weight at [facility]: 292.7 lbs (date: 12/07/2023)  Height: 64 in Weight today: 273.5 lbs BMI: 46.95 kg/m2     Clinical   Pharmacotherapy: History of weight loss medication used: phentermine (lost 50 pound first time)  Medical hx: hypercholesterolemia, GERD, obesity, sleep apnea Medications: omeprazole , levothyroxine , Crestor , amitriptyline , Abilify , metazoprine, Zepbound Labs: no recent labs in EMR Notable signs/symptoms: walking with pain (hips) Any previous deficiencies? No  Lifestyle & Dietary Hx  Pt states he fell off the wagon this past weekend. Pt states his PCP went up on the Zepbound to help with more weight loss, stating he went up to the 7.5 mg. Pt states he has not had any of the side effects, stating he can not eat red meat on the medication and sugar makes him very nauseas. Pt states he would like to increase gym activity to 4 days per week. Pt states his breathing is better when going to the gym, stating he needs to stop vaping. Pt states he uses alarms in phone to remind self to eat.  Estimated daily fluid intake: 96 oz Supplements: no Current average weekly physical activity: going [facility] [employer] going 2-3 days per week, weight lifting 30-45 minutes  24-Hr Dietary Recall First Meal: protein shake or eggs (scrambled or boiled) Snack: trail mix Second Meal: protein shake or chicken, broccoli Snack: trail mix Third Meal: chicken (air fried), steamed vegetables Snack:  Beverages: water, water with zero sugar liquid IV  Alcoholic beverages per week: 0   Estimated Energy Needs Calories: 1500  NUTRITION DIAGNOSIS   Overweight/obesity ([place]-3.3) related to past poor dietary habits and physical inactivity as evidenced by patient w/ planned RYGB surgery following dietary guidelines for continued weight loss.  NUTRITION INTERVENTION  Nutrition counseling (C-1) and education (E-2) to facilitate bariatric surgery goals.  Pre-Op Goals Reviewed with the Patient  Smoking and vaping after bariatric bypass surgery pose serious risks to recovery and long-term health. Nicotine , whether inhaled through cigarettes or e-cigarettes, constricts blood vessels, reducing blood flow and oxygen delivery to healing tissues--this significantly impairs wound healing and increases the risk of infections, ulcers, and surgical complications. Additionally, both smoking and vaping can worsen respiratory function, elevate the risk of blood clots, and interfere with nutrient absorption, which is already a concern after bariatric procedures. Patients are strongly advised to quit all forms of nicotine  before and after surgery to ensure optimal healing and support their weight loss journey.  Pre-Op Goals Progress & New Goals Continue: cut out grazing between meals and at night; schedule meals and snacks Continue: eat every 3-5 hours; aim for a protein with every meal or snack; aim for food and less reliant on protein shakes; Re-engage: track protein, aim for 60-80 grams per day Continue: use your container for meal prepping; use the bariatric MyPlate method for meal planning/prep Re-engage: Increase physical activity; aim for 4 days per week at the pool [facility]) 30-60 minutes and upper body weights for 30 minutes (aim for M, W, F, Sat). New: stop vaping; weaning off; use weaning dosing from psychiatrist; use no nicotine  vapes, use fidget spinner/tool.  Handouts Provided Include  Learning Style & Readiness for Change Teaching method utilized: Visual & Auditory  Demonstrated degree of understanding via: Teach Back  Readiness Level:  preparation Barriers to learning/adherence to lifestyle change: mobility  RD's Notes for next Visit  Patient progress toward chosen goals  MONITORING & EVALUATION Dietary intake, weekly physical activity, body weight, and pre-op goals in 1 month.   Next Steps  Patient is to return to [facility] in 1 month for next SWL visit.

## 2024-04-22 ENCOUNTER — Encounter: Attending: General Surgery | Admitting: Dietician

## 2024-04-22 ENCOUNTER — Encounter: Payer: Self-pay | Admitting: Dietician

## 2024-04-22 VITALS — Ht 64.0 in | Wt 279.1 lb

## 2024-04-22 DIAGNOSIS — Z713 Dietary counseling and surveillance: Secondary | ICD-10-CM | POA: Insufficient documentation

## 2024-04-22 DIAGNOSIS — Z6841 Body Mass Index (BMI) 40.0 and over, adult: Secondary | ICD-10-CM | POA: Diagnosis not present

## 2024-04-22 DIAGNOSIS — E669 Obesity, unspecified: Secondary | ICD-10-CM | POA: Insufficient documentation

## 2024-04-22 NOTE — Progress Notes (Signed)
 Supervised Weight Loss Visit Bariatric Nutrition Education Appt Start Time: 1031  End Time: 1055  Planned surgery: RYGB Pt expectation of surgery: To be about 160 lbs; main goal to be 220 to get hip surgery  Referral stated Supervised Weight Loss (SWL) visits needed: 6 Months  5 out of 6 SWL Appointments   NUTRITION ASSESSMENT   Anthropometrics  Start weight at [facility]: 292.7 lbs (date: 12/07/2023)  Height: 64 in Weight today: 279.1 lbs BMI: 47.91 kg/m2     Clinical   Pharmacotherapy: History of weight loss medication used: phentermine (lost 50 pound first time)  Medical hx: hypercholesterolemia, GERD, obesity, sleep apnea Medications: omeprazole , levothyroxine , Crestor , amitriptyline , Abilify , metazoprine, Zepbound Labs: no recent labs in EMR Notable signs/symptoms: walking with pain (hips) Any previous deficiencies? No  Lifestyle & Dietary Hx  Pt states the Zepbound has not be suppressing his appetite like it was. Pt states he has been stressed with school, stating he has had a lot of papers due lately. Pt states he is always drinking water, stating he has dry mouth due to medications. Pt states he is still trying to stop vaping. Pt states he wants to get a non-nicotine  vape to quit. Pt states he bought a Sport and exercise psychologist fidget spinner to have something to do with his hands. Pt states he downloaded a protein app, stating he is getting 60-80 grams per day. Pt states he bought a foods scale that connects to a phone app.  Estimated daily fluid intake: 96 oz Supplements: no Current average weekly physical activity: walking daily for 30 minutes; less [employer] visits  24-Hr Dietary Recall First Meal: protein shake or eggs (scrambled or boiled) Snack: trail mix Second Meal: protein shake or chicken, broccoli Snack: trail mix Third Meal: chicken (air fried), steamed vegetables Snack:  Beverages: water, water with zero sugar liquid IV  Alcoholic beverages per week: 0   Estimated  Energy Needs Calories: 1500  NUTRITION DIAGNOSIS  Overweight/obesity ([place]-3.3) related to past poor dietary habits and physical inactivity as evidenced by patient w/ planned RYGB surgery following dietary guidelines for continued weight loss.  NUTRITION INTERVENTION  Nutrition counseling (C-1) and education (E-2) to facilitate bariatric surgery goals.  Pre-Op Goals Reviewed with the Patient  Smoking and vaping after bariatric bypass surgery pose serious risks to recovery and long-term health. Nicotine , whether inhaled through cigarettes or e-cigarettes, constricts blood vessels, reducing blood flow and oxygen delivery to healing tissues--this significantly impairs wound healing and increases the risk of infections, ulcers, and surgical complications. Additionally, both smoking and vaping can worsen respiratory function, elevate the risk of blood clots, and interfere with nutrient absorption, which is already a concern after bariatric procedures. Patients are strongly advised to quit all forms of nicotine  before and after surgery to ensure optimal healing and support their weight loss journey.  NSAIDs (nonsteroidal anti-inflammatory drugs) like ibuprofen  and naproxen  are generally contraindicated after gastric bypass surgery due to the increased risk of developing ulcers, gastrointestinal bleeding, and impaired healing at surgical sites. The altered anatomy of the digestive system makes it more vulnerable to damage from these medications. Safer alternatives such as acetaminophen  are typically recommended for pain relief.  Pre-Op Goals Progress & New Goals Continue: cut out grazing between meals and at night; schedule meals and snacks Continue: eat every 3-5 hours; aim for a protein with every meal or snack; aim for food and less reliant on protein shakes; Re-engage: track protein, aim for 60-80 grams per day Continue: use your container for meal  prepping; use the bariatric MyPlate method for meal  planning/prep Continue: Increase physical activity; aim for 4 days per week at the gym [facility]) 30-60 minutes and upper body weights for 30 minutes or the pool (aim for M, W, F, Sat). Re-engage: stop vaping; weaning off; use weaning dosing from psychiatrist; use no nicotine  vapes, use fidget spinner/tool. Continue: practicing not drinking with meals.. New: use acetaminophen , instead of ibuprofen ; consult doctor   Handouts Provided Include    Learning Style & Readiness for Change Teaching method utilized: Visual & Auditory  Demonstrated degree of understanding via: Teach Back  Readiness Level: preparation Barriers to learning/adherence to lifestyle change: mobility  RD's Notes for next Visit  Patient progress toward chosen goals  MONITORING & EVALUATION Dietary intake, weekly physical activity, body weight, and pre-op goals in 1 month.   Next Steps  Patient is to return to [facility] in 1 month for next SWL visit.

## 2024-05-02 ENCOUNTER — Other Ambulatory Visit: Payer: Self-pay | Admitting: Family Medicine

## 2024-05-02 DIAGNOSIS — E7849 Other hyperlipidemia: Secondary | ICD-10-CM

## 2024-05-06 ENCOUNTER — Ambulatory Visit (HOSPITAL_COMMUNITY): Admitting: Licensed Clinical Social Worker

## 2024-05-06 DIAGNOSIS — F3132 Bipolar disorder, current episode depressed, moderate: Secondary | ICD-10-CM | POA: Diagnosis not present
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
## 2024-05-22 ENCOUNTER — Encounter: Payer: Self-pay | Admitting: Dietician

## 2024-05-22 ENCOUNTER — Encounter: Attending: General Surgery | Admitting: Dietician

## 2024-05-22 VITALS — Ht 64.0 in | Wt 281.1 lb

## 2024-05-22 DIAGNOSIS — Z713 Dietary counseling and surveillance: Secondary | ICD-10-CM | POA: Diagnosis not present

## 2024-05-22 DIAGNOSIS — E669 Obesity, unspecified: Secondary | ICD-10-CM | POA: Diagnosis present

## 2024-05-22 DIAGNOSIS — Z6841 Body Mass Index (BMI) 40.0 and over, adult: Secondary | ICD-10-CM | POA: Diagnosis not present

## 2024-05-22 NOTE — Progress Notes (Signed)
 Supervised Weight Loss Visit Bariatric Nutrition Education Appt Start Time: 9044  End Time: 1011  Planned surgery: RYGB Pt expectation of surgery: To be about 160 lbs; main goal to be 220 to get hip surgery  Referral stated Supervised Weight Loss (SWL) visits needed: 6 Months  6 out of 6 SWL Appointments  Pt completed visits.   Pt has cleared nutrition requirements.   NUTRITION ASSESSMENT   Anthropometrics  Start weight at [facility]: 292.7 lbs (date: 12/07/2023)  Height: 64 in Weight today: 281.1 lbs BMI: 48.25 kg/m2     Clinical   Pharmacotherapy: History of weight loss medication used: phentermine (lost 50 pound first time)  Medical hx: hypercholesterolemia, GERD, obesity, sleep apnea Medications: omeprazole , levothyroxine , Crestor , amitriptyline , Abilify , metazoprine, Zepbound Labs: no recent labs in EMR Notable signs/symptoms: walking with pain (hips) Any previous deficiencies? No  Lifestyle & Dietary Hx  Pt states he was increased in thyroid  medication/Crestor . 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 Pt states he is using an app called water llama, stating he hits 64-80 oz per day. Pt states he has been meal planning and prepping better, stating he has invested in meal prepping containers. Pt states he has done well aiming for satisfaction instead of fullness. Pt states he has done well getting physical activity and getting in the pool, due to hip pain. Pt states he has stopped vaping. Pt states he needs to work on scheduling his meals more consistently, stating to avoid being very hungry, stating starving. Pt states he feels confident where he is at, stating he is anxious to get the bariatric surgery and wants to eventually be ready for hip surgery for greater mobility.  Estimated daily fluid intake: 96 oz Supplements: no Current average weekly physical activity: walking daily for 30 minutes; less [employer]  visits  24-Hr Dietary Recall First Meal: protein shake or eggs (scrambled or boiled) Snack: trail mix Second Meal: protein shake or chicken, broccoli Snack: trail mix Third Meal: chicken (air fried), steamed vegetables Snack:  Beverages: water, water with zero sugar liquid IV  Alcoholic beverages per week: 0   Estimated Energy Needs Calories: 1500  NUTRITION DIAGNOSIS  Overweight/obesity (-3.3) related to past poor dietary habits and physical inactivity as evidenced by patient w/ planned RYGB surgery following dietary guidelines for continued weight loss.  NUTRITION INTERVENTION  Nutrition counseling (C-1) and education (E-2) to facilitate bariatric surgery goals.  Pre-Op Goals Reviewed with the Patient  Scheduling meals and snacks is a cornerstone of success for bariatric patients. A consistent eating schedule helps stabilize blood sugar, prevent excessive hunger, and ensure you're meeting your daily protein and hydration goals. When meals and snacks are planned and spaced throughout the day--typically every 3 to 4 hours--it becomes easier to include protein at each eating occasion and avoid skipping meals. This structure also supports mindful eating, reduces impulsive choices, and reinforces the new habits needed after surgery. By making nutrition a priority in your daily routine, you set yourself up for better energy, healing, and long-term weight management.  Pre-Op Goals Progress & New Goals Continue: cut out grazing between meals and at night; schedule meals and snacks Continue: eat every 3-5 hours; aim for a protein with every meal or snack; aim for food and less reliant on protein shakes; Re-engage: track protein, aim for 60-80 grams per day Continue: use your container for meal prepping; use the bariatric MyPlate method for meal planning/prep Continue: Increase physical  activity; aim for 4 days per week at the gym [facility]) 30-60 minutes and upper body weights for 30 minutes or  the pool (aim for M, W, F, Sat). Continue: stop vaping; weaning off; use weaning dosing from psychiatrist; use no nicotine  vapes, use fidget spinner/tool. Continue: practicing not drinking with meals.. Continue: use acetaminophen , instead of ibuprofen ; consult doctor   Handouts Provided Include    Learning Style & Readiness for Change Teaching method utilized: Visual & Auditory  Demonstrated degree of understanding via: Teach Back  Readiness Level: preparation Barriers to learning/adherence to lifestyle change: mobility, hip pain  RD's Notes for next Visit  Patient progress toward chosen goals  MONITORING & EVALUATION Dietary intake, weekly physical activity, body weight, and pre-op goals in 1 month.   Next Steps  Pt has completed visits. No further supervised visits required/recommended. Patient is to return to [facility] for pre-op class >2 weeks prior to scheduled surgery.

## 2024-07-14 NOTE — Progress Notes (Deleted)
 Cardiology Office Note    Date:  07/14/2024  ID:  [name], DOB 1990/12/04, MRN [id number] Cardiologist: Alvan Carrier, MD { :  History of Present Illness:    [name] is a 33 y.o. 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  She was last examined by Dr. Alvan in 10/2023 and was still having occasional episodes of chest pain but symptoms had improved with use of Klonopin. Her overall episodes of chest pain were felt to be atypical and perhaps related to her history of Still's Disease and also history of anxiety/panic attacks  It was not felt that symptoms were due to a cardiac etiology and was recommended to continue to monitor at that time.   ROS: ***  Studies Reviewed:   EKG: EKG is*** ordered today and demonstrates ***   EKG Interpretation Date/Time:    Ventricular Rate:    PR Interval:    QRS Duration:    QT Interval:    QTC Calculation:   R Axis:      Text Interpretation:         Echocardiogram: 11/2021 IMPRESSIONS     1. Left ventricular ejection fraction, by estimation, is 60 to 65%. The  left ventricle has normal function. The left ventricle has no regional  wall motion abnormalities. Left ventricular diastolic parameters were  normal. The average left ventricular  global longitudinal strain is -24.1 %. The global longitudinal strain is  normal.   2. Right ventricular systolic function is normal. The right ventricular  size is normal.   3. The mitral valve is normal in structure. No evidence of mitral valve  regurgitation. No evidence of mitral stenosis.   4. The aortic valve is tricuspid. Aortic valve regurgitation is not  visualized. No aortic stenosis is present.   5. The inferior vena cava is normal in size with  greater than 50%  respiratory variability, suggesting right atrial pressure of 3 mmHg.   Coronary CTA: 11/2021 IMPRESSION: 1. Coronary calcium  score of 0.   2. 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   3. Poor quality study due to noise secondary to obesity. No obstructive CAD seen. Appears to be noncalcified plaque in mid LAD causing mild (25-49%) stenosis   CAD-RADS 2. Mild non-obstructive CAD (25-49%). Consider non-atherosclerotic causes of chest pain. Consider preventive therapy and risk factor modification.  Risk Assessment/Calculations:   {Does this patient have ATRIAL FIBRILLATION?:[phone]} No BP recorded.  {Refresh Note OR Click here to enter BP  :1}***         Physical Exam:   VS:  There were no vitals taken for this visit.   Wt Readings from Last 3 Encounters:  05/22/24 281 lb 1.6 oz (127.5 kg)  04/22/24 279 lb 1.6 oz (126.6 kg)  03/27/24 273 lb 8 oz (124.1 kg)     GEN: Well nourished, well developed in no acute distress NECK: No JVD; No carotid bruits CARDIAC: ***RRR, no murmurs, rubs, gallops RESPIRATORY:  Clear to auscultation without rales, wheezing or rhonchi  ABDOMEN: Appears non-distended. No obvious abdominal masses. EXTREMITIES: No clubbing or cyanosis. No edema.  Distal pedal pulses are 2+ bilaterally.   Assessment and Plan:      {Are you ordering a CV Procedure (e.g. stress test, cath, DCCV, TEE,  etc)?   Press F2        :[id number]}   Signed, Laymon CHRISTELLA Qua, PA-C

## 2024-07-16 ENCOUNTER — Ambulatory Visit: Attending: Student | Admitting: Student

## 2024-07-28 ENCOUNTER — Encounter: Admitting: Skilled Nursing Facility1

## 2024-07-28 DIAGNOSIS — Z713 Dietary counseling and surveillance: Secondary | ICD-10-CM | POA: Diagnosis not present

## 2024-07-28 DIAGNOSIS — E66813 Obesity, class 3: Secondary | ICD-10-CM | POA: Diagnosis present

## 2024-07-28 DIAGNOSIS — Z6841 Body Mass Index (BMI) 40.0 and over, adult: Secondary | ICD-10-CM | POA: Diagnosis not present

## 2024-07-29 ENCOUNTER — Encounter: Payer: Self-pay | Admitting: Skilled Nursing Facility1

## 2024-07-29 NOTE — Progress Notes (Signed)
 Pre-Operative Nutrition Class:    Class start Time: 5:13   Class End Time: 6:15  This was a class of 4 patients.   Patient was seen on 07/28/2024 for Pre-Operative Bariatric Surgery Education [facility]    Surgery date:  Surgery type: RYGB Start weight at [facility]: 292.7 Weight today: 289.7  Samples given per MNT protocol. Patient educated on appropriate usage:  Celebrate Vitamins Multivitamin Lot # [id number] Exp: 09/26   Celebrate Vitamins Calcium   Lot # [id number] Exp: 12/25   Ensure Max Protein Shake Lot # [id number] Exp: 1JUL 2026  The following the learning objectives were met by the patient during this course: Identify Pre-Op Dietary Goals and will begin 2 weeks pre-operatively Identify appropriate sources of fluids and proteins  State protein recommendations and appropriate sources pre and post-operatively Identify Post-Operative Dietary Goals and will follow for 2 weeks post-operatively Identify appropriate multivitamin, calcium , and thiamin sources Describe the need for physical activity post-operatively and will follow MD recommendations State when to call healthcare provider regarding medication questions or post-operative complications When having a diagnosis of diabetes understanding hypoglycemia symptoms and the inclusion of 1 complex carbohydrate per meal  Handouts given during class include: Pre-Op Bariatric Surgery Diet Handout Protein Shake Handout Post-Op Bariatric Surgery Nutrition Handout BELT Program Information Flyer [employer] Information Flyer [facility] Bariatric Supplements Price List  Follow-Up Plan: Patient will follow-up at [facility] 2 weeks post operatively for diet advancement per MD.

## 2024-08-14 ENCOUNTER — Ambulatory Visit: Payer: Self-pay | Admitting: General Surgery

## 2024-08-14 DIAGNOSIS — E88819 Insulin resistance, unspecified: Secondary | ICD-10-CM

## 2024-08-14 DIAGNOSIS — Z01818 Encounter for other preprocedural examination: Secondary | ICD-10-CM

## 2024-08-14 NOTE — Progress Notes (Signed)
 Sent message, via epic in basket, requesting orders in epic from Careers adviser.

## 2024-08-18 NOTE — Discharge Instructions (Signed)

## 2024-08-19 NOTE — Patient Instructions (Signed)
 SURGICAL WAITING ROOM VISITATION  Patients having surgery or a procedure may have no more than 2 support people in the waiting area - these visitors may rotate.    Children ages 15 and under will not be able to visit patients in [facility] under most circumstances.   Visitors with respiratory illnesses are discouraged from visiting and should remain at home.  If the patient needs to stay at the hospital during part of their recovery, the visitor guidelines for inpatient rooms apply. Pre-op nurse will coordinate an appropriate time for 1 support person to accompany patient in pre-op.  This support person may not rotate.    Please refer to the [facility] website for the visitor guidelines for Inpatients (after your surgery is over and you are in a regular room).       Your procedure is scheduled on:  08/26/24    Report to [facility] Main Entrance    Report to admitting at  1030 AM   Call this number if you have problems the morning of surgery [phone]   Do not eat food :After Midnight.   After Midnight you may have the following liquids until _ 0930_____ AM DAY OF SURGERY  Water Non-Citrus Juices (without pulp, NO RED-Apple, White grape, White cranberry) Black Coffee (NO MILK/CREAM OR CREAMERS, sugar ok)  Clear Tea (NO MILK/CREAM OR CREAMERS, sugar ok) regular and decaf                             Plain Jell-O (NO RED)                                           Fruit ices (not with fruit pulp, NO RED)                                     Popsicles (NO RED)                                                               Sports drinks like Gatorade (NO RED)                    The day of surgery:  Drink ONE (1) Pre-Surgery Clear Ensure or G2 at 0930 AM the morning of surgery. Drink in one sitting. Do not sip.  This drink was given to you during your hospital  pre-op appointment visit. Nothing else to drink after completing the  Pre-Surgery Clear Ensure or  G2.          If you have questions, please contact your surgeons office.       Oral Hygiene is also important to reduce your risk of infection.                                    Remember - BRUSH YOUR TEETH THE MORNING OF SURGERY WITH YOUR REGULAR TOOTHPASTE  DENTURES WILL BE REMOVED PRIOR TO SURGERY PLEASE DO NOT APPLY Poly  grip OR ADHESIVES!!!   Do NOT smoke after Midnight   Stop all vitamins and herbal supplements 7 days before surgery.   Take these medicines the morning of surgery with A SIP OF WATER:  clonazepam if needed, synthroid , omeprazole    DO NOT TAKE ANY ORAL DIABETIC MEDICATIONS DAY OF YOUR SURGERY  Bring CPAP mask and tubing day of surgery.                              You may not have any metal on your body including hair pins, jewelry, and body piercing             Do not wear make-up, lotions, powders, perfumes/cologne, or deodorant  Do not wear nail polish including gel and S&S, artificial/acrylic nails, or any other type of covering on natural nails including finger and toenails. If you have artificial nails, gel coating, etc. that needs to be removed by a nail salon please have this removed prior to surgery or surgery may need to be canceled/ delayed if the surgeon/ anesthesia feels like they are unable to be safely monitored.   Do not shave  48 hours prior to surgery.               Men may shave face and neck.   Do not bring valuables to the hospital. [facility] IS NOT             RESPONSIBLE   FOR VALUABLES.   Contacts, glasses, dentures or bridgework may not be worn into surgery.   Bring small overnight bag day of surgery.   DO NOT BRING YOUR HOME MEDICATIONS TO THE HOSPITAL. PHARMACY WILL DISPENSE MEDICATIONS LISTED ON YOUR MEDICATION LIST TO YOU DURING YOUR ADMISSION IN THE HOSPITAL!    Patients discharged on the day of surgery will not be allowed to drive home.  Someone NEEDS to stay with you for the first 24 hours after anesthesia.   Special  Instructions: Bring a copy of your healthcare power of attorney and living will documents the day of surgery if you haven't scanned them before.              Please read over the following fact sheets you were given: IF YOU HAVE QUESTIONS ABOUT YOUR PRE-OP INSTRUCTIONS PLEASE CALL [phone].   If you received a COVID test during your pre-op visit  it is requested that you wear a mask when out in public, stay away from anyone that may not be feeling well and notify your surgeon if you develop symptoms. If you test positive for Covid or have been in contact with anyone that has tested positive in the last 10 days please notify you surgeon.    [facility] - Preparing for Surgery Before surgery, you can play an important role.  Because skin is not sterile, your skin needs to be as free of germs as possible.  You can reduce the number of germs on your skin by washing with CHG (chlorahexidine gluconate) soap before surgery.  CHG is an antiseptic cleaner which kills germs and bonds with the skin to continue killing germs even after washing. Please DO NOT use if you have an allergy to CHG or antibacterial soaps.  If your skin becomes reddened/irritated stop using the CHG and inform your nurse when you arrive at Short Stay. Do not shave (including legs and underarms) for at least 48 hours prior to the first CHG shower.  You may shave your face/neck.  Please follow these instructions carefully:  1.  Shower with CHG Soap the night before surgery ONLY (DO NOT USE THE SOAP THE MORNING OF SURGERY).  2.  If you choose to wash your hair, wash your hair first as usual with your normal  shampoo.  3.  After you shampoo, rinse your hair and body thoroughly to remove the shampoo.                             4.  Use CHG as you would any other liquid soap.  You can apply chg directly to the skin and wash.  Gently with a scrungie or clean washcloth.  5.  Apply the CHG Soap to your body ONLY FROM THE NECK DOWN.   Do   not  use on face/ open                           Wound or open sores. Avoid contact with eyes, ears mouth and   genitals (private parts).                       Wash face,  Genitals (private parts) with your normal soap.             6.  Wash thoroughly, paying special attention to the area where your    surgery  will be performed.  7.  Thoroughly rinse your body with warm water from the neck down.  8.  DO NOT shower/wash with your normal soap after using and rinsing off the CHG Soap.                9.  Pat yourself dry with a clean towel.            10.  Wear clean pajamas.            11.  Place clean sheets on your bed the night of your first shower and do not  sleep with pets. Day of Surgery : Do not apply any CHG, lotions/deodorants the morning of surgery.  Please wear clean clothes to the hospital/surgery center.  FAILURE TO FOLLOW THESE INSTRUCTIONS MAY RESULT IN THE CANCELLATION OF YOUR SURGERY  PATIENT SIGNATURE_________________________________  NURSE SIGNATURE__________________________________  ________________________________________________________________________

## 2024-08-19 NOTE — Progress Notes (Signed)
 Anesthesia Review:  ERE:Xjuyzmpwz Skillman  Cardiologist : Ethel Branch LOV 11/09/23  Kaitlyn West- clearance 11/21/23   PPM/ ICD: Device Orders: Rep Notified:  Chest x-ray : EKG : 11/09/23  Echo : 2023  Stress test: Cardiac Cath :  CT Cors- 2023   Activity level: cannot do a flight of stairs without difficutly  Sleep Study/ CPAP : has cpap  Fasting Blood Sugar :      / Checks Blood Sugar -- times a day:    Blood Thinner/ Instructions /Last Dose: ASA / Instructions/ Last Dose :   Blood pressure 138/101 at preop on 08/21/24.  Pt is in obvious pain due to hip .  PT states  My hip has been bothering me today.  .  PT denies any chest pain, shortness of breath, dizziness headache or blurred vision.  Pt has copy of blood pressure reading and will notify Dr Jeanette and monitor blood pressure.     Pt's partner has a  cold but pt has no symptoms.

## 2024-08-21 ENCOUNTER — Encounter (HOSPITAL_COMMUNITY): Payer: Self-pay

## 2024-08-21 ENCOUNTER — Other Ambulatory Visit: Payer: Self-pay

## 2024-08-21 ENCOUNTER — Encounter (HOSPITAL_COMMUNITY)
Admission: RE | Admit: 2024-08-21 | Discharge: 2024-08-21 | Disposition: A | Discharge status: Home / Self Care | Source: Ambulatory Visit | Attending: General Surgery | Admitting: General Surgery

## 2024-08-21 VITALS — BP 138/101 | HR 95 | Temp 99.1°F | Resp 16

## 2024-08-21 DIAGNOSIS — Z01812 Encounter for preprocedural laboratory examination: Secondary | ICD-10-CM | POA: Insufficient documentation

## 2024-08-21 DIAGNOSIS — Z01818 Encounter for other preprocedural examination: Secondary | ICD-10-CM

## 2024-08-21 DIAGNOSIS — I2510 Atherosclerotic heart disease of native coronary artery without angina pectoris: Secondary | ICD-10-CM | POA: Diagnosis not present

## 2024-08-21 DIAGNOSIS — E88819 Insulin resistance, unspecified: Secondary | ICD-10-CM | POA: Insufficient documentation

## 2024-08-21 DIAGNOSIS — M0820 Juvenile rheumatoid arthritis with systemic onset, unspecified site: Secondary | ICD-10-CM | POA: Diagnosis not present

## 2024-08-21 HISTORY — DX: Anemia, unspecified: D64.9

## 2024-08-21 HISTORY — DX: Nausea with vomiting, unspecified: R11.2

## 2024-08-21 HISTORY — DX: Dyspnea, unspecified: R06.00

## 2024-08-21 LAB — COMPREHENSIVE METABOLIC PANEL WITH GFR
ALT: 69 U/L — ABNORMAL HIGH (ref 0–44)
AST: 49 U/L — ABNORMAL HIGH (ref 15–41)
Albumin: 4.4 g/dL (ref 3.5–5.0)
Alkaline Phosphatase: 91 U/L (ref 38–126)
Anion gap: 11 (ref 5–15)
BUN: 11 mg/dL (ref 6–20)
CO2: 24 mmol/L (ref 22–32)
Calcium: 10.2 mg/dL (ref 8.9–10.3)
Chloride: 103 mmol/L (ref 98–111)
Creatinine, Ser: 0.92 mg/dL (ref 0.44–1.00)
GFR, Estimated: >60 mL/min
Glucose, Bld: 95 mg/dL (ref 70–99)
Potassium: 4 mmol/L (ref 3.5–5.1)
Sodium: 138 mmol/L (ref 135–145)
Total Bilirubin: 0.6 mg/dL (ref 0.0–1.2)
Total Protein: 8.4 g/dL — ABNORMAL HIGH (ref 6.5–8.1)

## 2024-08-21 LAB — CBC WITH DIFFERENTIAL/PLATELET
Abs Immature Granulocytes: 0.01 K/uL (ref 0.00–0.07)
Basophils Absolute: 0 K/uL (ref 0.0–0.1)
Basophils Relative: 0 %
Eosinophils Absolute: 0.3 K/uL (ref 0.0–0.5)
Eosinophils Relative: 3 %
HCT: 45.3 % (ref 36.0–46.0)
Hemoglobin: 14.6 g/dL (ref 12.0–15.0)
Immature Granulocytes: 0 %
Lymphocytes Relative: 25 %
Lymphs Abs: 2.4 K/uL (ref 0.7–4.0)
MCH: 28.9 pg (ref 26.0–34.0)
MCHC: 32.2 g/dL (ref 30.0–36.0)
MCV: 89.5 fL (ref 80.0–100.0)
Monocytes Absolute: 0.4 K/uL (ref 0.1–1.0)
Monocytes Relative: 5 %
Neutro Abs: 6.3 K/uL (ref 1.7–7.7)
Neutrophils Relative %: 67 %
Platelets: 306 K/uL (ref 150–400)
RBC: 5.06 MIL/uL (ref 3.87–5.11)
RDW: 13.9 % (ref 11.5–15.5)
WBC: 9.4 K/uL (ref 4.0–10.5)
nRBC: 0 % (ref 0.0–0.2)

## 2024-08-22 ENCOUNTER — Encounter (HOSPITAL_COMMUNITY): Payer: Self-pay

## 2024-08-22 NOTE — Anesthesia Preprocedure Evaluation (Addendum)
"                                    Anesthesia Evaluation  Patient identified by MRN, date of birth, ID band Patient awake    Reviewed: Allergy & Precautions, NPO status , Patient's Chart, lab work & pertinent test results  History of Anesthesia Complications (+) PONV and history of anesthetic complications  Airway Mallampati: II  TM Distance: >3 FB Neck ROM: Full    Dental  (+) Missing,    Pulmonary sleep apnea and Continuous Positive Airway Pressure Ventilation , former smoker   Pulmonary exam normal        Cardiovascular Normal cardiovascular exam  TTE 12/26/2021: EF 60-65%, valves ok       Neuro/Psych   Anxiety Depression Bipolar Disorder      GI/Hepatic ,GERD  Medicated,,(+)     substance abuse  marijuana use  Endo/Other  Hypothyroidism  Class 3 obesity  Renal/GU negative Renal ROS     Musculoskeletal  (+) Arthritis ,    Abdominal   Peds  Hematology negative hematology ROS (+)   Anesthesia Other Findings Still disease   Reproductive/Obstetrics Transgender female                              Anesthesia Physical Anesthesia Plan  ASA: 3  Anesthesia Plan: General   Post-op Pain Management: Tylenol  PO (pre-op)*, Ketamine  IV* and Precedex    Induction: Intravenous  PONV Risk Score and Plan: 4 or greater and Treatment may vary due to age or medical condition, Ondansetron , Dexamethasone , Midazolam  and Propofol  infusion  Airway Management Planned: Oral ETT  Additional Equipment: None  Intra-op Plan:   Post-operative Plan: Extubation in OR  Informed Consent: I have reviewed the patients History and Physical, chart, labs and discussed the procedure including the risks, benefits and alternatives for the proposed anesthesia with the patient or authorized representative who has indicated his/her understanding and acceptance.     Dental advisory given  Plan Discussed with: CRNA  Anesthesia Plan Comments: (See PAT  note from 1/22)         Anesthesia Quick Evaluation  "

## 2024-08-22 NOTE — Progress Notes (Signed)
 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 Per outside notes troponin was elevated but flat at that time which was felt to be due to pericarditis. Treated with prednisone  and improved. Admitted at [facility] in 2023 for chest pain that improved with nitroglycerin . Patient underwent coronary CTA which showed mild nonobstructive CAD and Echo which was normal. Last seen in clinic by Dr. Alvan on 11/09/23. Chest pain episodes felt to be atypical. Advised continue to monitor. Cardiac clearance given in 11/21/23 telephone note:  Chart reviewed as part of pre-operative protocol coverage. Given past medical history and time since last visit, based on ACC/AHA guidelines, [name] is at acceptable risk for the planned procedure without further cardiovascular testing. Per Dr. Alvan patient ok to proceed with surgery from cardiac standpoint.  Patient follows intermittently with Rheumatology. Last seen by Dr. Jeannetta on 04/24/23 for possible flare. Advised to take steroid taper. Otherwise not on any long term medications.   VS: BP (!) 138/101   Pulse 95   Temp 37.3 C (Oral)   Resp 16   SpO2 98%   PROVIDERS: Skillman, Katherine E, PA-C   LABS: Labs reviewed:  Acceptable for surgery. (all labs ordered are listed, but only abnormal results are displayed)  Labs Reviewed  COMPREHENSIVE METABOLIC PANEL WITH GFR - Abnormal; Notable for the following components:      Result Value   Total Protein 8.4 (*)    AST 49 (*)    ALT 69 (*)    All other components within normal limits  CBC WITH DIFFERENTIAL/PLATELET  TYPE AND SCREEN     EKG 11/09/23:  Normal sinus rhythm Rightward axis Low voltage QRS Nonspecific T wave abnormality   CCTA 12/27/2021:  IMPRESSION: 1. Coronary calcium  score of 0.   2. 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   3. Poor quality study due to noise secondary to obesity. No obstructive CAD seen. Appears to be noncalcified plaque in mid LAD causing mild (25-49%) stenosis   CAD-RADS 2. Mild non-obstructive CAD (25-49%). Consider non-atherosclerotic causes of chest pain. Consider preventive therapy and risk factor modification.  Echo 12/26/2021:  IMPRESSIONS    1. Left ventricular ejection fraction, by estimation, is 60 to 65%. The left ventricle has normal function. The left ventricle has no regional wall motion abnormalities. Left ventricular diastolic parameters were normal. The average left ventricular global longitudinal strain is -24.1 %. The global longitudinal strain is normal.  2. Right ventricular systolic function is normal. The right ventricular size is normal.  3. The mitral valve is normal in structure. No evidence of mitral valve regurgitation. No evidence of mitral stenosis.  4. The aortic valve is tricuspid. Aortic valve regurgitation is not visualized. No aortic  stenosis is present.  5. The inferior vena cava is normal in size with greater than 50% respiratory variability, suggesting right atrial pressure of 3 mmHg.   Past Medical History:  Diagnosis Date   ADHD    Anemia    Anxiety attack    panic attacks   Back pain    Bipolar disorder (HCC)    Chest pain     Constipation    Depression    Drug use    Dyspnea    Food allergy    Gallbladder problem    GERD (gastroesophageal reflux disease)    History of heart attack    History of stomach ulcers    based on symptoms, around age 45   HLD (hyperlipidemia)    Hypothyroidism    Joint pain    Osteoarthritis    Pericarditis    PONV (postoperative nausea and vomiting)    RA (rheumatoid arthritis) (HCC)    Sleep apnea    has cpap   SOB (shortness of breath)    Still disease, 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HCC)    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HCC)    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HCC)    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HCC)    Swallowing difficulty    Vitamin D  deficiency     Past Surgical History:  Procedure Laterality Date   BALLOON DILATION N/A 12/22/2022   Procedure: BALLOON DILATION;  Surgeon: Cindie Carlin POUR, DO;  [facility];  Service: Endoscopy;  Laterality: N/A;   CHOLECYSTECTOMY N/A 08/29/2021   Procedure: LAPAROSCOPIC CHOLECYSTECTOMY WITH ICG;  Surgeon: Teresa Lonni HERO, MD;  Location: [facility];  Service: General;  Laterality: N/A;   ESOPHAGEAL DILATION N/A 11/19/2023   Procedure: DILATION, ESOPHAGUS;  Surgeon: Cindie Carlin POUR, DO;  [facility];  Service: Endoscopy;  Laterality: N/A;   ESOPHAGOGASTRODUODENOSCOPY N/A 11/19/2023   Procedure: EGD (ESOPHAGOGASTRODUODENOSCOPY);  Surgeon: Cindie Carlin POUR, DO;  [facility];  Service: Endoscopy;  Laterality: N/A;  1:45 pm, asa 3   ESOPHAGOGASTRODUODENOSCOPY (EGD) WITH PROPOFOL  N/A 12/22/2022   Surgeon: Cindie Carlin POUR, DO;   Small hiatal hernia, mild Schatzki's ring dilated, gastritis biopsied.  Path benign.   WISDOM TOOTH EXTRACTION      MEDICATIONS:  amitriptyline  (ELAVIL ) 100 MG tablet   ARIPiprazole  (ABILIFY ) 20 MG tablet   clonazePAM (KLONOPIN) 0.5 MG tablet   fluticasone  (FLONASE ) 50 MCG/ACT nasal spray   ibuprofen  (ADVIL ) 200 MG tablet   levothyroxine  (SYNTHROID ) 50 MCG tablet   mirtazapine  (REMERON ) 30 MG tablet   omeprazole   (PRILOSEC) 40 MG capsule   ondansetron  (ZOFRAN ) 4 MG tablet   rosuvastatin  (CRESTOR ) 10 MG tablet   Thiamine  HCl (VITAMIN B-1 PO)   traMADol  (ULTRAM ) 50 MG tablet   No current facility-administered medications for this encounter.   Burnard HERO Odis DEVONNA [facility] Short Stay/Anesthesiology [facility] Phone [phone] 08/22/2024 11:09 AM        "

## 2024-08-26 ENCOUNTER — Encounter (HOSPITAL_COMMUNITY): Admitting: Certified Registered"

## 2024-08-26 ENCOUNTER — Other Ambulatory Visit: Payer: Self-pay

## 2024-08-26 ENCOUNTER — Encounter (HOSPITAL_COMMUNITY): Payer: Self-pay | Admitting: General Surgery

## 2024-08-26 ENCOUNTER — Inpatient Hospital Stay (HOSPITAL_COMMUNITY)
Admission: RE | Admit: 2024-08-26 | Discharge: 2024-08-28 | DRG: 620 | Disposition: A | Discharge status: Home / Self Care | Source: Ambulatory Visit | Attending: General Surgery | Admitting: General Surgery

## 2024-08-26 ENCOUNTER — Encounter (HOSPITAL_COMMUNITY): Admission: RE | Disposition: A | Payer: Self-pay | Source: Ambulatory Visit | Attending: General Surgery

## 2024-08-26 ENCOUNTER — Encounter (HOSPITAL_COMMUNITY): Admitting: Medical

## 2024-08-26 DIAGNOSIS — M810 Age-related osteoporosis without current pathological fracture: Secondary | ICD-10-CM | POA: Diagnosis present

## 2024-08-26 DIAGNOSIS — E781 Pure hyperglyceridemia: Secondary | ICD-10-CM | POA: Diagnosis present

## 2024-08-26 DIAGNOSIS — Z6841 Body Mass Index (BMI) 40.0 and over, adult: Secondary | ICD-10-CM | POA: Diagnosis not present

## 2024-08-26 DIAGNOSIS — Z87891 Personal history of nicotine dependence: Secondary | ICD-10-CM

## 2024-08-26 DIAGNOSIS — Z9102 Food additives allergy status: Secondary | ICD-10-CM

## 2024-08-26 DIAGNOSIS — Z8261 Family history of arthritis: Secondary | ICD-10-CM | POA: Diagnosis not present

## 2024-08-26 DIAGNOSIS — M160 Bilateral primary osteoarthritis of hip: Secondary | ICD-10-CM | POA: Diagnosis present

## 2024-08-26 DIAGNOSIS — Z79899 Other long term (current) drug therapy: Secondary | ICD-10-CM | POA: Diagnosis not present

## 2024-08-26 DIAGNOSIS — F4010 Social phobia, unspecified: Secondary | ICD-10-CM | POA: Diagnosis present

## 2024-08-26 DIAGNOSIS — F640 Transsexualism: Secondary | ICD-10-CM | POA: Diagnosis present

## 2024-08-26 DIAGNOSIS — Z83438 Family history of other disorder of lipoprotein metabolism and other lipidemia: Secondary | ICD-10-CM

## 2024-08-26 DIAGNOSIS — Z818 Family history of other mental and behavioral disorders: Secondary | ICD-10-CM

## 2024-08-26 DIAGNOSIS — E039 Hypothyroidism, unspecified: Secondary | ICD-10-CM | POA: Diagnosis present

## 2024-08-26 DIAGNOSIS — Z833 Family history of diabetes mellitus: Secondary | ICD-10-CM

## 2024-08-26 DIAGNOSIS — G4730 Sleep apnea, unspecified: Secondary | ICD-10-CM | POA: Diagnosis not present

## 2024-08-26 DIAGNOSIS — K219 Gastro-esophageal reflux disease without esophagitis: Secondary | ICD-10-CM | POA: Diagnosis present

## 2024-08-26 DIAGNOSIS — Z813 Family history of other psychoactive substance abuse and dependence: Secondary | ICD-10-CM

## 2024-08-26 DIAGNOSIS — E559 Vitamin D deficiency, unspecified: Secondary | ICD-10-CM | POA: Diagnosis present

## 2024-08-26 DIAGNOSIS — Z8249 Family history of ischemic heart disease and other diseases of the circulatory system: Secondary | ICD-10-CM

## 2024-08-26 DIAGNOSIS — F3132 Bipolar disorder, current episode depressed, moderate: Secondary | ICD-10-CM | POA: Diagnosis present

## 2024-08-26 DIAGNOSIS — Z79890 Hormone replacement therapy (postmenopausal): Secondary | ICD-10-CM

## 2024-08-26 DIAGNOSIS — Z9884 Bariatric surgery status: Principal | ICD-10-CM

## 2024-08-26 DIAGNOSIS — K429 Umbilical hernia without obstruction or gangrene: Secondary | ICD-10-CM | POA: Diagnosis present

## 2024-08-26 DIAGNOSIS — M0820 Juvenile rheumatoid arthritis with systemic onset, unspecified site: Secondary | ICD-10-CM | POA: Diagnosis present

## 2024-08-26 DIAGNOSIS — Z01818 Encounter for other preprocedural examination: Secondary | ICD-10-CM

## 2024-08-26 DIAGNOSIS — G4733 Obstructive sleep apnea (adult) (pediatric): Secondary | ICD-10-CM | POA: Diagnosis present

## 2024-08-26 DIAGNOSIS — D849 Immunodeficiency, unspecified: Secondary | ICD-10-CM | POA: Diagnosis present

## 2024-08-26 DIAGNOSIS — F603 Borderline personality disorder: Secondary | ICD-10-CM | POA: Diagnosis present

## 2024-08-26 DIAGNOSIS — R112 Nausea with vomiting, unspecified: Secondary | ICD-10-CM

## 2024-08-26 LAB — TYPE AND SCREEN
ABO/RH(D): A POS
Antibody Screen: NEGATIVE

## 2024-08-26 LAB — POCT PREGNANCY, URINE: Preg Test, Ur: NEGATIVE

## 2024-08-26 MED ORDER — CHLORHEXIDINE GLUCONATE 4 % EX SOLN: Freq: Once | CUTANEOUS | Status: DC

## 2024-08-26 MED ORDER — DEXAMETHASONE SOD PHOSPHATE PF 10 MG/ML IJ SOLN
INTRAMUSCULAR | Status: AC
  Filled 2024-08-26: qty 1

## 2024-08-26 MED ORDER — DEXAMETHASONE SOD PHOSPHATE PF 10 MG/ML IJ SOLN: 4.0000 mg | INTRAMUSCULAR | Status: DC

## 2024-08-26 MED ORDER — PHENYLEPHRINE HCL-NACL 20-0.9 MG/250ML-% IV SOLN
INTRAVENOUS | Status: DC | PRN
  Administered 2024-08-26: 30 ug/min via INTRAVENOUS

## 2024-08-26 MED ORDER — ACETAMINOPHEN 500 MG PO TABS
1000.0000 mg | ORAL_TABLET | Freq: Three times a day (TID) | ORAL | Status: DC
  Administered 2024-08-27 – 2024-08-28 (×3): 1000 mg via ORAL
  Filled 2024-08-26 (×3): qty 2

## 2024-08-26 MED ORDER — FENTANYL CITRATE (PF) 100 MCG/2ML IJ SOLN
INTRAMUSCULAR | Status: AC
  Filled 2024-08-26: qty 2

## 2024-08-26 MED ORDER — APREPITANT 40 MG PO CAPS
40.0000 mg | ORAL_CAPSULE | ORAL | Status: AC
  Administered 2024-08-26: 40 mg via ORAL
  Filled 2024-08-26: qty 1

## 2024-08-26 MED ORDER — ROCURONIUM BROMIDE 10 MG/ML (PF) SYRINGE
PREFILLED_SYRINGE | INTRAVENOUS | Status: AC
  Filled 2024-08-26: qty 10

## 2024-08-26 MED ORDER — FLUTICASONE PROPIONATE 50 MCG/ACT NA SUSP: 2.0000 | Freq: Every day | NASAL | Status: DC | PRN

## 2024-08-26 MED ORDER — GABAPENTIN 100 MG PO CAPS
100.0000 mg | ORAL_CAPSULE | ORAL | Status: AC
  Administered 2024-08-26: 100 mg via ORAL
  Filled 2024-08-26: qty 1

## 2024-08-26 MED ORDER — SUGAMMADEX SODIUM 200 MG/2ML IV SOLN
INTRAVENOUS | Status: DC | PRN
  Administered 2024-08-26: 300 mg via INTRAVENOUS

## 2024-08-26 MED ORDER — BUPIVACAINE-EPINEPHRINE (PF) 0.25% -1:200000 IJ SOLN
INTRAMUSCULAR | Status: AC
  Filled 2024-08-26: qty 60

## 2024-08-26 MED ORDER — SIMETHICONE 80 MG PO CHEW
80.0000 mg | CHEWABLE_TABLET | Freq: Four times a day (QID) | ORAL | Status: DC | PRN
  Administered 2024-08-26: 80 mg via ORAL
  Filled 2024-08-26: qty 1

## 2024-08-26 MED ORDER — KETAMINE HCL 50 MG/5ML IJ SOSY
PREFILLED_SYRINGE | INTRAMUSCULAR | Status: AC
  Filled 2024-08-26: qty 5

## 2024-08-26 MED ORDER — KCL IN DEXTROSE-NACL 20-5-0.45 MEQ/L-%-% IV SOLN
INTRAVENOUS | Status: DC
  Filled 2024-08-26 (×5): qty 1000

## 2024-08-26 MED ORDER — "VISTASEAL 4 ML SINGLE DOSE KIT "
4.0000 mL | PACK | Freq: Once | CUTANEOUS | Status: AC
  Administered 2024-08-26: 4 mL via TOPICAL
  Filled 2024-08-26: qty 4

## 2024-08-26 MED ORDER — LACTATED RINGERS IR SOLN
Status: DC | PRN
  Administered 2024-08-26: 1000 mL

## 2024-08-26 MED ORDER — MORPHINE SULFATE (PF) 2 MG/ML IV SOLN
1.0000 mg | INTRAVENOUS | Status: DC | PRN
  Administered 2024-08-26 – 2024-08-27 (×2): 2 mg via INTRAVENOUS
  Filled 2024-08-26 (×2): qty 1

## 2024-08-26 MED ORDER — FENTANYL CITRATE (PF) 100 MCG/2ML IJ SOLN
INTRAMUSCULAR | Status: DC | PRN
  Administered 2024-08-26: 100 ug via INTRAVENOUS

## 2024-08-26 MED ORDER — ROCURONIUM BROMIDE 100 MG/10ML IV SOLN
INTRAVENOUS | Status: DC | PRN
  Administered 2024-08-26: 20 mg via INTRAVENOUS
  Administered 2024-08-26: 70 mg via INTRAVENOUS
  Administered 2024-08-26: 10 mg via INTRAVENOUS

## 2024-08-26 MED ORDER — OXYCODONE HCL 5 MG/5ML PO SOLN
5.0000 mg | Freq: Four times a day (QID) | ORAL | Status: DC | PRN
  Administered 2024-08-26 – 2024-08-27 (×2): 5 mg via ORAL
  Filled 2024-08-26 (×2): qty 5

## 2024-08-26 MED ORDER — HEPARIN SODIUM (PORCINE) 5000 UNIT/ML IJ SOLN
5000.0000 [IU] | INTRAMUSCULAR | Status: AC
  Administered 2024-08-26: 5000 [IU] via SUBCUTANEOUS
  Filled 2024-08-26: qty 1

## 2024-08-26 MED ORDER — FENTANYL CITRATE (PF) 50 MCG/ML IJ SOSY
25.0000 ug | PREFILLED_SYRINGE | INTRAMUSCULAR | Status: DC | PRN
  Administered 2024-08-26 (×3): 50 ug via INTRAVENOUS

## 2024-08-26 MED ORDER — ACETAMINOPHEN 500 MG PO TABS
1000.0000 mg | ORAL_TABLET | ORAL | Status: AC
  Administered 2024-08-26: 1000 mg via ORAL
  Filled 2024-08-26: qty 2

## 2024-08-26 MED ORDER — FIBRIN SEALANT 2 ML SINGLE DOSE KIT
2.0000 mL | PACK | Freq: Once | CUTANEOUS | Status: AC
  Administered 2024-08-26: 2 mL via TOPICAL
  Filled 2024-08-26: qty 2

## 2024-08-26 MED ORDER — FENTANYL CITRATE (PF) 50 MCG/ML IJ SOSY
PREFILLED_SYRINGE | INTRAMUSCULAR | Status: AC
  Filled 2024-08-26: qty 2

## 2024-08-26 MED ORDER — MIRTAZAPINE 15 MG PO TABS
30.0000 mg | ORAL_TABLET | Freq: Every day | ORAL | Status: DC
  Administered 2024-08-26 – 2024-08-27 (×2): 30 mg via ORAL
  Filled 2024-08-26 (×2): qty 2

## 2024-08-26 MED ORDER — DROPERIDOL 2.5 MG/ML IJ SOLN: 0.6250 mg | Freq: Once | INTRAMUSCULAR | Status: DC | PRN

## 2024-08-26 MED ORDER — ONDANSETRON HCL 4 MG/2ML IJ SOLN
INTRAMUSCULAR | Status: AC
  Filled 2024-08-26: qty 2

## 2024-08-26 MED ORDER — BUPIVACAINE-EPINEPHRINE 0.25% -1:200000 IJ SOLN
INTRAMUSCULAR | Status: DC | PRN
  Administered 2024-08-26: 60 mL

## 2024-08-26 MED ORDER — MIDAZOLAM HCL 5 MG/5ML IJ SOLN
INTRAMUSCULAR | Status: DC | PRN
  Administered 2024-08-26: 2 mg via INTRAVENOUS

## 2024-08-26 MED ORDER — CHLORHEXIDINE GLUCONATE 0.12 % MT SOLN: 15.0000 mL | Freq: Once | OROMUCOSAL | Status: DC

## 2024-08-26 MED ORDER — PROPOFOL 500 MG/50ML IV EMUL
INTRAVENOUS | Status: DC | PRN
  Administered 2024-08-26: 50 ug/kg/min via INTRAVENOUS

## 2024-08-26 MED ORDER — LIDOCAINE HCL (CARDIAC) PF 100 MG/5ML IV SOSY
PREFILLED_SYRINGE | INTRAVENOUS | Status: DC | PRN
  Administered 2024-08-26: 100 mg via INTRAVENOUS

## 2024-08-26 MED ORDER — ACETAMINOPHEN 160 MG/5ML PO SOLN
1000.0000 mg | Freq: Three times a day (TID) | ORAL | Status: DC
  Administered 2024-08-26 – 2024-08-27 (×3): 1000 mg via ORAL
  Filled 2024-08-26 (×3): qty 40.6

## 2024-08-26 MED ORDER — SCOPOLAMINE 1 MG/3DAYS TD PT72
1.0000 | MEDICATED_PATCH | TRANSDERMAL | Status: DC
  Administered 2024-08-26: 1 mg via TRANSDERMAL
  Filled 2024-08-26: qty 1

## 2024-08-26 MED ORDER — LACTATED RINGERS IV SOLN: INTRAVENOUS | Status: DC

## 2024-08-26 MED ORDER — LEVOTHYROXINE SODIUM 50 MCG PO TABS
50.0000 ug | ORAL_TABLET | Freq: Every day | ORAL | Status: DC
  Administered 2024-08-27 – 2024-08-28 (×2): 50 ug via ORAL
  Filled 2024-08-26 (×2): qty 1

## 2024-08-26 MED ORDER — PROPOFOL 10 MG/ML IV BOLUS
INTRAVENOUS | Status: DC | PRN
  Administered 2024-08-26: 200 mg via INTRAVENOUS

## 2024-08-26 MED ORDER — 0.9 % SODIUM CHLORIDE (POUR BTL) OPTIME
TOPICAL | Status: DC | PRN
  Administered 2024-08-26: 1000 mL

## 2024-08-26 MED ORDER — PHENYLEPHRINE HCL (PRESSORS) 10 MG/ML IV SOLN
INTRAVENOUS | Status: DC | PRN
  Administered 2024-08-26 (×7): 80 ug via INTRAVENOUS

## 2024-08-26 MED ORDER — SUGAMMADEX SODIUM 200 MG/2ML IV SOLN
INTRAVENOUS | Status: AC
  Filled 2024-08-26: qty 4

## 2024-08-26 MED ORDER — ONDANSETRON HCL 4 MG/2ML IJ SOLN: 4.0000 mg | Freq: Four times a day (QID) | INTRAMUSCULAR | Status: DC | PRN

## 2024-08-26 MED ORDER — OXYCODONE HCL 5 MG PO TABS: 5.0000 mg | ORAL_TABLET | Freq: Once | ORAL | Status: AC | PRN

## 2024-08-26 MED ORDER — ORAL CARE MOUTH RINSE: 15.0000 mL | Freq: Once | OROMUCOSAL | Status: DC

## 2024-08-26 MED ORDER — AMITRIPTYLINE HCL 50 MG PO TABS
100.0000 mg | ORAL_TABLET | Freq: Every day | ORAL | Status: DC
  Administered 2024-08-26 – 2024-08-27 (×2): 100 mg via ORAL
  Filled 2024-08-26 (×2): qty 2

## 2024-08-26 MED ORDER — GABAPENTIN 100 MG PO CAPS
100.0000 mg | ORAL_CAPSULE | Freq: Two times a day (BID) | ORAL | Status: DC
  Administered 2024-08-26 – 2024-08-27 (×3): 100 mg via ORAL
  Filled 2024-08-26 (×3): qty 1

## 2024-08-26 MED ORDER — PROPOFOL 10 MG/ML IV BOLUS
INTRAVENOUS | Status: AC
  Filled 2024-08-26: qty 20

## 2024-08-26 MED ORDER — ENSURE MAX PROTEIN PO LIQD
2.0000 [oz_av] | ORAL | Status: DC
  Administered 2024-08-27 – 2024-08-28 (×9): 2 [oz_av] via ORAL

## 2024-08-26 MED ORDER — OXYCODONE HCL 5 MG/5ML PO SOLN
ORAL | Status: AC
  Filled 2024-08-26: qty 5

## 2024-08-26 MED ORDER — SODIUM CHLORIDE 0.9 % IV SOLN: 12.5000 mg | Freq: Four times a day (QID) | INTRAVENOUS | Status: DC | PRN

## 2024-08-26 MED ORDER — OXYCODONE HCL 5 MG/5ML PO SOLN
5.0000 mg | Freq: Once | ORAL | Status: AC | PRN
  Administered 2024-08-26: 5 mg via ORAL

## 2024-08-26 MED ORDER — ARIPIPRAZOLE 10 MG PO TABS
20.0000 mg | ORAL_TABLET | Freq: Every day | ORAL | Status: DC
  Administered 2024-08-26 – 2024-08-27 (×2): 20 mg via ORAL
  Filled 2024-08-26 (×2): qty 2

## 2024-08-26 MED ORDER — FENTANYL CITRATE (PF) 50 MCG/ML IJ SOSY
PREFILLED_SYRINGE | INTRAMUSCULAR | Status: AC
  Filled 2024-08-26: qty 1

## 2024-08-26 MED ORDER — PANTOPRAZOLE SODIUM 40 MG IV SOLR
40.0000 mg | Freq: Every day | INTRAVENOUS | Status: DC
  Administered 2024-08-26 – 2024-08-27 (×2): 40 mg via INTRAVENOUS
  Filled 2024-08-26 (×2): qty 10

## 2024-08-26 MED ORDER — KETAMINE HCL 50 MG/5ML IJ SOSY
PREFILLED_SYRINGE | INTRAMUSCULAR | Status: DC | PRN
  Administered 2024-08-26: 30 mg via INTRAVENOUS

## 2024-08-26 MED ORDER — MIDAZOLAM HCL 2 MG/2ML IJ SOLN
INTRAMUSCULAR | Status: AC
  Filled 2024-08-26: qty 2

## 2024-08-26 MED ORDER — HEPARIN SODIUM (PORCINE) 5000 UNIT/ML IJ SOLN
5000.0000 [IU] | Freq: Three times a day (TID) | INTRAMUSCULAR | Status: DC
  Administered 2024-08-26 – 2024-08-28 (×5): 5000 [IU] via SUBCUTANEOUS
  Filled 2024-08-26 (×5): qty 1

## 2024-08-26 MED ORDER — SODIUM CHLORIDE 0.9 % IV SOLN
2.0000 g | INTRAVENOUS | Status: AC
  Administered 2024-08-26: 2 g via INTRAVENOUS
  Filled 2024-08-26: qty 2

## 2024-08-26 NOTE — Op Note (Signed)
 Havannah Streat Vangorden [id number] June 15, 1991. 08/26/2024  Preoperative diagnosis:  Severe obesity (BMI 44) Obstructive sleep apnea (adult) (pediatric) Bipolar affective disorder, currently depressed, moderate (CMS/HHS-HCC) Osteoarthritis of both hips, unspecified osteoarthritis type Arthralgia of multiple sites, bilateral Vitamin D  insufficiency Borderline personality disorder (CMS/HHS-HCC) Gastroesophageal reflux disease without esophagitis Dyslipidemia   Postoperative  diagnosis:  1. Same + 2cm umbilical hernia  Surgical procedure: Laparoscopic Roux-en-Y gastric bypass (ante-colic, ante-gastric); upper endoscopy + laparoscopic primary repair of 2 cm umbilical hernia  Surgeon: Camellia HERO Blush, M.D. FACS  Asst.: Mitzie Freund MD FACs  Anesthesia: General plus bupivacaine  as local  Complications: None   EBL: Minimal   Drains: None   Disposition: PACU in good condition   Indications for procedure: 34 y.o. yo  with morbid obesity who has been unsuccessful at sustained weight loss. The patient's comorbidities are listed above. We discussed the risk and benefits of surgery including but not limited to anesthesia risk, bleeding, infection, blood clot formation, anastomotic leak, anastomotic stricture, ulcer formation, death, respiratory complications, intestinal blockage, internal hernia, gallstone formation, vitamin and nutritional deficiencies, injury to surrounding structures, failure to lose weight and mood changes.   Description of procedure: Patient is brought to the operating room and general anesthesia induced. The patient had received preoperative broad-spectrum IV antibiotics and subcutaneous heparin . The abdomen was widely sterilely prepped with Chloraprep and draped. Patient timeout was performed and correct patient and procedure confirmed. Access was obtained with a 5 mm Optiview trocar in the left upper quadrant and pneumoperitoneum established without difficulty. Under direct  vision 12 mm trocars were placed laterally in the right upper quadrant, right upper quadrant midclavicular line, and 5 mm trocar to the left and above the umbilicus for the camera port. A 5 mm trocar was placed laterally in the left upper quadrant.  Patient had a 2 cm umbilical hernia containing a little bit of omentum.  In order to do the procedure I knew we would have to reduce the tethered omentum from the umbilical hernia defect.  This was taken down with harmonic scalpel.  Bupivacaine  local was infiltrated in bilateral lateral abdominal walls as a TAP block for postoperative pain relief.  The omentum was brought into the upper abdomen and the transverse mesocolon elevated and the ligament of Treitz clearly identified. A 60 cm biliopancreatic limb was then carefully measured from the ligament of Treitz. The small intestine was divided at this point with a single firing of the white load linear stapler. A Penrose drain was sutured to the end of the Roux-en-Y limb for later identification. A 100 cm Roux-en-Y limb was then carefully measured. At this point a side-to-side anastomosis was created between the Roux limb and the end of the biliopancreatic limb. This was accomplished with a single firing of the 60 mm white load linear stapler. The common enterotomy was closed with a running 2-0 Vicryl begun at either end of the enterotomy and tied centrally. Vistaseal  tissue sealant was placed over the anastomosis. The mesenteric defect was then closed with running 2-0 silk. The omentum was then divided with the harmonic scalpel up towards the transverse colon to allow mobility of the Roux limb toward the gastric pouch. The patient was then placed in steep reversed Trendelenburg. Through a 5 mm subxiphoid site the [facility] retractor was placed and the left lobe of the liver elevated with excellent exposure of the upper stomach and hiatus. The angle of Hiss was then mobilized with the harmonic scalpel. A 5 cm gastric  pouch  was then carefully measured along the lesser curve of the stomach. Dissection was carried along the lesser curve at this point with the Harmonic scalpel working carefully back toward the lesser sac at right angles to the lesser curve (starting at the third vessel along the lesser curvature of the stomach). The free lesser sac was then entered. After being sure all tubes were removed from the stomach an initial firing of the blue load 60 mm linear stapler was fired at right angles across the lesser curve for about 4 cm. The gastric pouch was further mobilized posteriorly and then the pouch was completed with 4 further firings of the 60 mm blue load linear stapler up through the previously dissected angle of His. It was ensured that the pouch was completely mobilized away from the gastric remnant. This created a nice tubular 5 cm gastric pouch. The Roux limb was then brought up in an antecolic fashion with the candycane facing to the patient's left without undue tension. The gastrojejunostomy was created with an initial posterior row of 2-0 Vicryl between the Roux limb and the staple line of the gastric pouch. Enterotomies were then made in the gastric pouch and the Roux limb with the harmonic scalpel and at approximately 2-2-1/2 cm anastomosis was created with a single firing of the 60mm blue load linear stapler. The staple line was inspected and was intact without bleeding. The common enterotomy was then closed with running 2-0 Vicryl begun at either end and tied centrally. The Ewall tube was then easily passed through the anastomosis and an outer anterior layer of running 2-0 Vicryl was placed. The Ewald tube was removed. With the outlet of the gastrojejunostomy clamped and under saline irrigation the assistant performed upper endoscopy and with the gastric pouch tensely distended with air-there was no evidence of leak on this test. The pouch was desufflated. The Andra defect was closed with running 2-0  silk. The abdomen was inspected for any evidence of bleeding or bowel injury and everything looked fine.  Because we had reduced the omentum from the umbilical hernia I felt that it would need to be closed in order to prevent a loop of bowel from getting stuck in it causing an issue.  A small stab incision was made at the base of the umbilicus.  Then using a PMI suture passer with 0 interrupted Vicryl I started closing the umbilical fascial defect with 2 interrupted 0 Vicryl sutures through that small stab incision.  I then made another small tiny stab incision at the apex of umbilicus and placed an additional interrupted 0 Vicryl using the PMI suture passer.  The umbilical fascial defect was well approximated.  There was no remaining gap.  The Nathanson retractor was removed under direct vision after coating the anastomosis with Vistaseal  tissue sealant. All CO2 was evacuated and trochars removed. Skin incisions were closed with 4-0 monocryl in a subcuticular fashion followed by steri-strips and bandages. Sponge needle and instrument counts were correct. The patient was taken to the PACU in good condition.    Camellia HERO. Tanda, MD, FACS General, Bariatric, & Minimally Invasive Surgery [facility],  [facility]

## 2024-08-26 NOTE — Plan of Care (Signed)
   Problem: Activity: Goal: Risk for activity intolerance will decrease Outcome: Progressing   Problem: Coping: Goal: Level of anxiety will decrease Outcome: Progressing   Problem: Elimination: Goal: Will not experience complications related to bowel motility Outcome: Progressing   Problem: Safety: Goal: Ability to remain free from injury will improve Outcome: Progressing

## 2024-08-26 NOTE — Progress Notes (Signed)
 Patient seen in SS06  QI Goals for Discharge reminders provided to pt. Pt was still groggy at the time and had c/o mid abdominal pain. Patients pain was addressed as evidence by Emar. Provided update to both 3E unit charge and surgeon.Will continue to partner with staff and follow up with patient per protocol.   Thank you,  Roseann Medley, RN, MSN Bariatric Nurse Coordinator [phone] (office)

## 2024-08-26 NOTE — Op Note (Signed)
 Preoperative diagnosis: Roux-en-Y gastric bypass  Postoperative diagnosis: Same   Procedure: Upper endoscopy   Surgeon: Mitzie DELENA Freund, M.D.  Anesthesia: Gen.   Description of procedure: The endoscope was placed in the mouth and oropharynx and under endoscopic vision it was advanced to the esophagogastric junction which was identified at 38cm from the teeth.  The pouch was tensely insufflated while the upper abdomen was flooded with irrigation to perform a leak test, which was negative. No bubbles were seen.  The staple line was hemostatic and the anastomosis is visibly patent, intact and appears well perfused. The pouch measures 5cm in length while fully distended.There is no retained fundus. The lumen was decompressed and the scope was withdrawn without difficulty.    Mitzie DELENA Freund, M.D. General, Bariatric, & Minimally Invasive Surgery [facility]

## 2024-08-26 NOTE — Anesthesia Postprocedure Evaluation (Signed)
"   Anesthesia Post Note  Patient: [name]  Procedure(s) Performed: CREATION, GASTRIC BYPASS, LAPAROSCOPIC, USING ROUX-EN-Y GASTROENTEROSTOMY, LAPAROSCOPIC UMBILICAL HERNIA REPAIR ENDOSCOPY, UPPER GI TRACT     Patient location during evaluation: PACU Anesthesia Type: General Level of consciousness: awake and alert Pain management: pain level controlled Vital Signs Assessment: post-procedure vital signs reviewed and stable Respiratory status: spontaneous breathing, nonlabored ventilation and respiratory function stable Cardiovascular status: blood pressure returned to baseline Postop Assessment: no apparent nausea or vomiting Anesthetic complications: no   No notable events documented.  Last Vitals:  Vitals:   08/26/24 1500 08/26/24 1515  BP: (!) 158/93 (!) 159/102  Pulse: 88 91  Resp: 13 13  Temp:    SpO2: 96% 97%    Last Pain:  Vitals:   08/26/24 1515  TempSrc:   PainSc: Asleep                 Vertell Row      "
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
## 2024-08-26 NOTE — Interval H&P Note (Signed)
 History and Physical Interval Note:  08/26/2024 9:45 AM  [name]  has presented today for surgery, with the diagnosis of MORBID OBESITY.  The various methods of treatment have been discussed with the patient and family. After consideration of risks, benefits and other options for treatment, the patient has consented to  Procedures: CREATION, GASTRIC BYPASS, LAPAROSCOPIC, USING ROUX-EN-Y GASTROENTEROSTOMY, WITH HIATAL HERNIA REPAIR (N/A) ENDOSCOPY, UPPER GI TRACT (N/A) as a surgical intervention.  The patient's history has been reviewed, patient examined, no change in status, stable for surgery.  I have reviewed the patient's chart and labs.  Questions were answered to the patient's satisfaction.     [name]

## 2024-08-26 NOTE — Progress Notes (Signed)
 PHARMACY CONSULT FOR:  Risk Assessment for Post-Discharge VTE Following Bariatric Surgery  Procedure* Roux-en-Y gastric bypass   Sex F  Black race N  Age (years) 34  BMI (kg/m2) 47  Operation duration (minutes)  History of VTE requiring treatment* N  Hypercoagulable condition* N  Liver disorder* N  Pre-op venous stasis N  Pre-op functional health status Independent  Previous foregut or bariatric surg N  Post-op surgical site infection N  Transfusion intra- or post-op* N  Unplanned readmission N  Unplanned reoperation N  GI perforation/leak/obstruction* N  *specific risk factors for portomesenteric venous thrombosis   Predicted probability of 30-day post-discharge VTE:    0.2493 % estimated using the [facility] Calculator  Other patient-specific factors to consider: None No longer on supplemental hormone therapy. Last 2023.  Recommendation for Discharge: No pharmacologic prophylaxis post-discharge    [name] [name] is a 34 y.o. adult who underwent  Roux-en-Y gastric bypass on 08/26/24   Case start: 1133 Case end: 1336   Allergies[1]  Patient Measurements: Height: 5' 4 (162.6 cm) Weight: 125.2 kg (276 lb) IBW/kg (Calculated) : 54.7 Body mass index is 47.38 kg/m.  No results for input(s): WBC, HGB, HCT, PLT, APTT, CREATININE, LABCREA, CREAT24HRUR, MG, PHOS, ALBUMIN, PROT, AST, ALT, ALKPHOS, BILITOT, BILIDIR, IBILI in the last 72 hours. Estimated Creatinine Clearance (by C-G formula based on SCr of 0.92 mg/dL) Female: 887.1 mL/min Female: 137 mL/min    Past Medical History:  Diagnosis Date   ADHD    Anemia    Anxiety attack    panic attacks   Back pain    Bipolar disorder (HCC)    Chest pain    Constipation    Depression    Drug use    Dyspnea    Food allergy    Gallbladder problem    GERD (gastroesophageal reflux disease)    History of heart attack    History of stomach ulcers     based on symptoms, around age 69   HLD (hyperlipidemia)    Hypothyroidism    Joint pain    Osteoarthritis    Pericarditis    PONV (postoperative nausea and vomiting)    RA (rheumatoid arthritis) (HCC)    Sleep apnea    has cpap   SOB (shortness of breath)    Still disease, 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HCC)    Swallowing difficulty    Vitamin D  deficiency      Medications Prior to Admission  Medication Sig Dispense Refill Last Dose/Taking   amitriptyline  (ELAVIL ) 100 MG tablet Take 100 mg by mouth at bedtime.   Past Week   ARIPiprazole  (ABILIFY ) 20 MG tablet Take 20 mg by mouth at bedtime.   Past Week   clonazePAM (KLONOPIN) 0.5 MG tablet Take 0.5 mg by mouth daily as needed for anxiety.   08/25/2024 Bedtime   fluticasone  (FLONASE ) 50 MCG/ACT nasal spray Place 2 sprays into both nostrils daily. (Patient taking differently: Place 2 sprays into both nostrils daily as needed for allergies.) 16 g 6 Taking Differently   levothyroxine  (SYNTHROID ) 50 MCG tablet Take 50 mcg by mouth daily before breakfast.   Taking   mirtazapine  (REMERON ) 30 MG tablet Take 30 mg by mouth at bedtime.   Past Week Noon   omeprazole  (PRILOSEC) 40 MG capsule TAKE 1 CAPSULE(40 MG) BY MOUTH TWICE DAILY BEFORE A MEAL 180 capsule 1 Past Week   ondansetron  (ZOFRAN ) 4 MG tablet Take 1 tablet (4 mg total) by  mouth every 8 (eight) hours as needed for nausea or vomiting. 60 tablet 1 Unknown   rosuvastatin  (CRESTOR ) 10 MG tablet Take 1 tablet (10 mg total) by mouth daily. 90 tablet 3 Past Week   Thiamine  HCl (VITAMIN B-1 PO) Take 1 tablet by mouth in the morning.   Past Week   traMADol  (ULTRAM ) 50 MG tablet Take 50 mg by mouth every 12 (twelve) hours as needed (pain.).   Past Week   ibuprofen  (ADVIL ) 200 MG tablet Take 800 mg by mouth every 6 (six) hours as needed (For leg pain). (Patient not taking: Reported on 08/26/2024)   Not Taking     [name] [name], PharmD, BCPS Clinical Staff Pharmacist  [name] [name]  [name] 08/26/2024,2:00 PM     [1]  Allergies Allergen Reactions   Strawberry Extract Itching and Rash

## 2024-08-26 NOTE — Transfer of Care (Signed)
 Immediate Anesthesia Transfer of Care Note  Patient: [name]  Procedure(s) Performed: CREATION, GASTRIC BYPASS, LAPAROSCOPIC, USING ROUX-EN-Y GASTROENTEROSTOMY, LAPAROSCOPIC UMBILICAL HERNIA REPAIR ENDOSCOPY, UPPER GI TRACT  Patient Location: PACU  Anesthesia Type:General  Level of Consciousness: drowsy and responds to stimulation  Airway & Oxygen Therapy: Patient Spontanous Breathing and Patient connected to face mask oxygen  Post-op Assessment: Report given to RN and Post -op Vital signs reviewed and stable  Post vital signs: Reviewed and stable  Last Vitals:  Vitals Value Taken Time  BP 156/116 08/26/24 13:45  Temp    Pulse 92 08/26/24 13:47  Resp 25 08/26/24 13:47  SpO2 100 % 08/26/24 13:47  Vitals shown include unfiled device data.  Last Pain:  Vitals:   08/26/24 0946  TempSrc: Oral  PainSc: 0-No pain         Complications: No notable events documented.

## 2024-08-26 NOTE — Anesthesia Procedure Notes (Signed)
 Procedure Name: Intubation Date/Time: 08/26/2024 11:12 AM  Performed by: Metta Andrea NOVAK, CRNAPre-anesthesia Checklist: Patient identified, Emergency Drugs available, Suction available, Patient being monitored and Timeout performed Patient Re-evaluated:Patient Re-evaluated prior to induction Oxygen Delivery Method: Circle system utilized Preoxygenation: Pre-oxygenation with 100% oxygen Induction Type: IV induction Ventilation: Mask ventilation without difficulty Laryngoscope Size: Glidescope and 4 Grade View: Grade I Tube type: Oral Tube size: 7.0 mm Number of attempts: 1 Airway Equipment and Method: Stylet Placement Confirmation: ETT inserted through vocal cords under direct vision, positive ETCO2 and breath sounds checked- equal and bilateral Secured at: 21 cm Tube secured with: Tape Dental Injury: Teeth and Oropharynx as per pre-operative assessment

## 2024-08-27 ENCOUNTER — Encounter (HOSPITAL_COMMUNITY): Payer: Self-pay | Admitting: General Surgery

## 2024-08-27 LAB — COMPREHENSIVE METABOLIC PANEL WITH GFR
ALT: 78 U/L — ABNORMAL HIGH (ref 0–44)
AST: 55 U/L — ABNORMAL HIGH (ref 15–41)
Albumin: 4.4 g/dL (ref 3.5–5.0)
Alkaline Phosphatase: 84 U/L (ref 38–126)
Anion gap: 9 (ref 5–15)
BUN: 7 mg/dL (ref 6–20)
CO2: 25 mmol/L (ref 22–32)
Calcium: 9.4 mg/dL (ref 8.9–10.3)
Chloride: 105 mmol/L (ref 98–111)
Creatinine, Ser: 0.98 mg/dL (ref 0.44–1.00)
GFR, Estimated: >60 mL/min
Glucose, Bld: 144 mg/dL — ABNORMAL HIGH (ref 70–99)
Potassium: 4.7 mmol/L (ref 3.5–5.1)
Sodium: 139 mmol/L (ref 135–145)
Total Bilirubin: 0.5 mg/dL (ref 0.0–1.2)
Total Protein: 8 g/dL (ref 6.5–8.1)

## 2024-08-27 LAB — CBC WITH DIFFERENTIAL/PLATELET
Abs Immature Granulocytes: 0.08 10*3/uL — ABNORMAL HIGH (ref 0.00–0.07)
Basophils Absolute: 0 10*3/uL (ref 0.0–0.1)
Basophils Relative: 0 %
Eosinophils Absolute: 0 10*3/uL (ref 0.0–0.5)
Eosinophils Relative: 0 %
HCT: 41.1 % (ref 36.0–46.0)
Hemoglobin: 13.4 g/dL (ref 12.0–15.0)
Immature Granulocytes: 1 %
Lymphocytes Relative: 9 %
Lymphs Abs: 1.3 10*3/uL (ref 0.7–4.0)
MCH: 28.7 pg (ref 26.0–34.0)
MCHC: 32.6 g/dL (ref 30.0–36.0)
MCV: 88 fL (ref 80.0–100.0)
Monocytes Absolute: 0.6 10*3/uL (ref 0.1–1.0)
Monocytes Relative: 4 %
Neutro Abs: 13.5 10*3/uL — ABNORMAL HIGH (ref 1.7–7.7)
Neutrophils Relative %: 86 %
Platelets: 307 10*3/uL (ref 150–400)
RBC: 4.67 MIL/uL (ref 3.87–5.11)
RDW: 14 % (ref 11.5–15.5)
WBC: 15.6 10*3/uL — ABNORMAL HIGH (ref 4.0–10.5)
nRBC: 0 % (ref 0.0–0.2)

## 2024-08-27 MED ORDER — KETOROLAC TROMETHAMINE 30 MG/ML IJ SOLN: 30.0000 mg | Freq: Three times a day (TID) | INTRAMUSCULAR | Status: DC

## 2024-08-27 MED ORDER — KETOROLAC TROMETHAMINE 30 MG/ML IJ SOLN
30.0000 mg | Freq: Two times a day (BID) | INTRAMUSCULAR | Status: DC
  Administered 2024-08-27 – 2024-08-28 (×2): 30 mg via INTRAVENOUS
  Filled 2024-08-27 (×2): qty 1

## 2024-08-27 MED ORDER — METHOCARBAMOL 500 MG PO TABS
750.0000 mg | ORAL_TABLET | Freq: Three times a day (TID) | ORAL | Status: DC
  Administered 2024-08-27 (×2): 750 mg via ORAL
  Filled 2024-08-27 (×2): qty 2

## 2024-08-27 NOTE — Progress Notes (Signed)
" °   08/27/24 1007  TOC Brief Assessment  Insurance and Status Reviewed  Patient has primary care physician Yes  Home environment has been reviewed lives alone  Prior level of function: independent  Prior/Current Home Services No current home services  Social Drivers of Health Review SDOH reviewed no interventions necessary  Readmission risk has been reviewed Yes  Transition of care needs no transition of care needs at this time    "

## 2024-08-27 NOTE — Progress Notes (Signed)
 Patient alert and oriented, working towards pain control. Patient is tolerating fluids, advanced to protein shake today, patient is tolerating well; just feels full. Reviewed Gastric sleeve/bypass discharge instructions with patient and patient is able to articulate understanding. Provided information on BELT program, Support Group, BSTOP-D, and [facility] outpatient pharmacy. Communicated general update of patient status to surgeon.  Discussed QI Goals for Discharge document with patient including ambulation in halls, Incentive Spirometry use every hour, and oral care.  Also discussed pain and nausea control.  Enabled or verified head of bed 30 degree alarm activated. All questions answered. 24hr fluid recall is 600 mL per hydration protocol, bariatric nurse coordinator to make follow-up phone call within one week.    Thank you,  Roseann Medley, RN, MSN Bariatric Nurse Coordinator [phone] (office)

## 2024-08-27 NOTE — Progress Notes (Signed)
 Patient ID: [name], adult   DOB: [birth year], 34 y.o.   MRN: [id number]   Progress Note: Metabolic and Bariatric Surgery Service   Chief Complaint/Subjective: Drinking going well On 4th cup protein; water went well Just having a fair amount of pain at umbilicus No nausea  Objective: Vital signs in last 24 hours: Temp:  [97.5 F (36.4 C)-98.6 F (37 C)] 97.8 F (36.6 C) (01/28 0951) Pulse Rate:  [68-98] 68 (01/28 0951) Resp:  [12-20] 18 (01/28 0951) BP: (124-162)/(79-109) 159/100 (01/28 0951) SpO2:  [95 %-100 %] 99 % (01/28 0951) Last BM Date : 08/24/24  Intake/Output from previous day: 01/27 0701 - 01/28 0700 In: 1906.5 [P.O.:600; I.V.:1206.5; IV Piggyback:100] Out: 2420 [Urine:2400; Blood:20] Intake/Output this shift: No intake/output data recorded.  Lungs: nonlabored, symmetric  Cardiovascular: reg  Abd: soft, obese, dressing c/d/I, umbilicus - expected TTP  Extremities: no edema  Neuro: nonfocal  Lab Results: CBC  Recent Labs    08/27/24 0514  WBC 15.6*  HGB 13.4  HCT 41.1  PLT 307   BMET Recent Labs    08/27/24 0514  NA 139  K 4.7  CL 105  CO2 25  GLUCOSE 144*  BUN 7  CREATININE 0.98  CALCIUM  9.4   PT/INR No results for input(s): LABPROT, INR in the last 72 hours. ABG No results for input(s): PHART, HCO3 in the last 72 hours.  Invalid input(s): PCO2, PO2  Studies/Results:  Anti-infectives: Anti-infectives (From admission, onward)    Start     Dose/Rate Route Frequency Ordered Stop   08/26/24 0945  cefoTEtan  (CEFOTAN ) 2 g in sodium chloride  0.9 % 100 mL IVPB        2 g 200 mL/hr over 30 Minutes Intravenous On call to O.R. 08/26/24 0935 08/26/24 1115       Medications: Scheduled Meds:  acetaminophen   1,000 mg Oral Q8H   Or   acetaminophen  (TYLENOL ) oral liquid 160 mg/5 mL  1,000 mg Oral Q8H   amitriptyline   100 mg Oral QHS   ARIPiprazole   20 mg Oral QHS   gabapentin   100 mg Oral Q12H   heparin  injection  (subcutaneous)  5,000 Units Subcutaneous Q8H   ketorolac   30 mg Intravenous Q12H   levothyroxine   50 mcg Oral QAC breakfast   methocarbamol   750 mg Oral TID   mirtazapine   30 mg Oral QHS   pantoprazole  (PROTONIX ) IV  40 mg Intravenous QHS   Ensure Max Protein  2 oz Oral Q2H   Continuous Infusions:  dextrose  5 % and 0.45 % NaCl with KCl 20 mEq/L 125 mL/hr at 08/27/24 1013   promethazine  (PHENERGAN ) injection (IM or IVPB)     PRN Meds:.fluticasone , morphine  injection, ondansetron  (ZOFRAN ) IV, oxyCODONE , promethazine  (PHENERGAN ) injection (IM or IVPB), simethicone   Assessment/Plan: Patient Active Problem List   Diagnosis Date Noted   S/P gastric bypass 08/26/2024   Chronic hip pain, bilateral 03/02/2023   BMI 50.0-59.9, adult (HCC) 09/19/2022   OSA on CPAP 08/30/2022   Other specified hypothyroidism 08/22/2022   Elevated cholesterol 08/22/2022   Insulin  resistance 07/18/2022   History of tachycardia 06/06/2022   Elevated ALT measurement 06/06/2022   SOB (shortness of breath) on exertion 05/23/2022   Health care maintenance 05/23/2022   Metabolic syndrome 05/23/2022   Vitamin B12 deficiency 05/23/2022   Acquired hypothyroidism 05/09/2022   Mixed hyperlipidemia 05/09/2022   Low HDL (under 40) 05/09/2022   Elevated glucose 05/09/2022   Eating disorder 05/09/2022   Other fatigue 05/09/2022  Class 3 severe obesity with serious comorbidity and body mass index (BMI) of 45.0 to 49.9 in adult [facility]) 05/09/2022   Lumbar degenerative disc disease 03/13/2022   Bipolar depression (HCC) 03/06/2022   Chronic bilateral low back pain with right-sided sciatica 02/15/2022   Vitamin D  deficiency 01/04/2022   Loud snoring 01/04/2022   GAD (generalized anxiety disorder) 01/04/2022   Marijuana use 01/04/2022   Chest pain 12/25/2021   Transaminitis 12/25/2021   GERD without esophagitis 12/25/2021   Elevated lipase 12/25/2021   Compulsive skin picking 07/28/2020   Social phobia 07/28/2020    Bipolar I disorder, current or most recent episode depressed, in partial remission (HCC) 07/19/2020   Bilateral primary osteoarthritis of hip 07/19/2020   Allergic rhinitis due to pollen 05/12/2020   MDD (major depressive disorder), recurrent episode, severe (HCC) 05/02/2019   High risk medication use 05/03/2017   Tobacco use 05/29/2016   Arthralgia of multiple sites, bilateral 03/06/2016   Morbid obesity (HCC) 11/05/2014   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   Insomnia 02/18/2014   Bipolar affective disorder, currently depressed, moderate (HCC) 05/25/2011   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   s/p Procedures: CREATION, GASTRIC BYPASS, LAPAROSCOPIC, USING ROUX-EN-Y GASTROENTEROSTOMY, LAPAROSCOPIC UMBILICAL HERNIA REPAIR ENDOSCOPY, UPPER GI TRACT 08/26/2024  Having more pain than typical bypass bc of the primary muscle repair of the umbilical hernia - 3 sutures in muscle Will add some toradol  and muscle relaxant Cont chemical vte prophylaxis Ambulate Cont IVF  Discussed intra-op findings Recalculated vte risk after dc adding partially dependent - still below threshold for vte prophylaxis on dc as of today Disposition:  LOS: 1 day  The patient does not meet criteria for discharge because He has uncontrolled pain and is requiring intravenous medications  [name], MD [phone] [facility]

## 2024-08-27 NOTE — Plan of Care (Signed)
  Problem: Clinical Measurements: Goal: Cardiovascular complication will be avoided Outcome: Progressing   Problem: Activity: Goal: Risk for activity intolerance will decrease Outcome: Progressing   Problem: Pain Managment: Goal: General experience of comfort will improve and/or be controlled Outcome: Progressing

## 2024-08-27 NOTE — Progress Notes (Signed)
 Patient started protein at 0605. Educated on slow sips over an hour per order. Plan of care ongoing.

## 2024-08-28 ENCOUNTER — Other Ambulatory Visit (HOSPITAL_COMMUNITY): Payer: Self-pay

## 2024-08-28 ENCOUNTER — Other Ambulatory Visit (HOSPITAL_BASED_OUTPATIENT_CLINIC_OR_DEPARTMENT_OTHER): Payer: Self-pay

## 2024-08-28 LAB — CBC WITH DIFFERENTIAL/PLATELET
Abs Immature Granulocytes: 0.03 10*3/uL (ref 0.00–0.07)
Basophils Absolute: 0 10*3/uL (ref 0.0–0.1)
Basophils Relative: 0 %
Eosinophils Absolute: 0.1 10*3/uL (ref 0.0–0.5)
Eosinophils Relative: 1 %
HCT: 37 % (ref 36.0–46.0)
Hemoglobin: 11.9 g/dL — ABNORMAL LOW (ref 12.0–15.0)
Immature Granulocytes: 0 %
Lymphocytes Relative: 33 %
Lymphs Abs: 3.5 10*3/uL (ref 0.7–4.0)
MCH: 29.1 pg (ref 26.0–34.0)
MCHC: 32.2 g/dL (ref 30.0–36.0)
MCV: 90.5 fL (ref 80.0–100.0)
Monocytes Absolute: 0.5 10*3/uL (ref 0.1–1.0)
Monocytes Relative: 5 %
Neutro Abs: 6.5 10*3/uL (ref 1.7–7.7)
Neutrophils Relative %: 61 %
Platelets: 251 10*3/uL (ref 150–400)
RBC: 4.09 MIL/uL (ref 3.87–5.11)
RDW: 14.1 % (ref 11.5–15.5)
WBC: 10.7 10*3/uL — ABNORMAL HIGH (ref 4.0–10.5)
nRBC: 0 % (ref 0.0–0.2)

## 2024-08-28 MED ORDER — GABAPENTIN 100 MG PO CAPS
100.0000 mg | ORAL_CAPSULE | Freq: Two times a day (BID) | ORAL | 0 refills | Status: AC
  Filled 2024-08-28: qty 12, 6d supply, fill #0

## 2024-08-28 MED ORDER — ACETAMINOPHEN 500 MG PO TABS: 1000.0000 mg | ORAL_TABLET | Freq: Three times a day (TID) | ORAL | Status: AC

## 2024-08-28 MED ORDER — METHOCARBAMOL 750 MG PO TABS
750.0000 mg | ORAL_TABLET | Freq: Three times a day (TID) | ORAL | 0 refills | Status: AC | PRN
  Filled 2024-08-28: qty 21, 7d supply, fill #0

## 2024-08-28 MED ORDER — ONDANSETRON HCL 4 MG PO TABS
4.0000 mg | ORAL_TABLET | Freq: Three times a day (TID) | ORAL | 1 refills | Status: AC | PRN
  Filled 2024-08-28: qty 20, 7d supply, fill #0

## 2024-08-28 MED ORDER — OXYCODONE HCL 5 MG PO TABS
5.0000 mg | ORAL_TABLET | Freq: Four times a day (QID) | ORAL | 0 refills | Status: AC | PRN
  Filled 2024-08-28: qty 15, 4d supply, fill #0

## 2024-08-28 NOTE — Plan of Care (Signed)
" °  Problem: Health Behavior/Discharge Planning: Goal: Ability to manage health-related needs will improve Outcome: Progressing   Problem: Activity: Goal: Risk for activity intolerance will decrease Outcome: Adequate for Discharge   Problem: Nutrition: Goal: Adequate nutrition will be maintained Outcome: Progressing   Problem: Elimination: Goal: Will not experience complications related to urinary retention Outcome: Completed/Met   Problem: Safety: Goal: Ability to remain free from injury will improve Outcome: Progressing   "

## 2024-08-28 NOTE — Progress Notes (Signed)
 Spoke with patient to check in, patient stated that the pain has been controlled better with the use of toradol  and they feel comfortable with getting PO fluid intake.  Reminded patient to continually sip throughout the day to avoid dehydration. Asked patient if any further questions. None at this time.  24hr P.O. intake recall 840 mL     08/28/2024    5:19 AM 08/28/2024    1:57 AM 08/27/2024    8:44 PM  Vitals with BMI  Systolic 121 137 867  Diastolic 78 100 75  Pulse 67 69 60    Thank you,  Roseann Medley, RN, MSN Bariatric Nurse Coordinator [phone] (office)

## 2024-08-28 NOTE — Discharge Summary (Signed)
 Physician Discharge Summary  [name] FMW:[id number] DOB: 1990/12/21 DOA: 08/26/2024  PCP: Skillman, Katherine E, PA-C  Admit date: 08/26/2024 Discharge date: 08/28/2024  Recommendations for Outpatient Follow-up:     Follow-up Information     Tanda Locus, MD Follow up on 09/17/2024.   Specialty: General Surgery Why: Please arrive 15 minutes prior to your appointment at [facility] information: [address] [place] [address] [phone]         Tari Tonja Barban, [place] Follow up on 10/16/2024.   Specialty: General Surgery Why: Please arrive 15 minutes prior to your appointment at 9am with MYRTIS Barban on behalf of Dr. Tanda Pass information: [address] [facility] [place] [facility] [place] [address] [phone]                Discharge Diagnoses:  Principal Problem:   S/P lap gastric bypass & Laparoscopic primary umbilical hernia repair Obstructive sleep apnea (adult) (pediatric) Bipolar affective disorder, currently depressed, moderate (CMS/HHS-HCC) Osteoarthritis of both hips, unspecified osteoarthritis type Arthralgia of multiple sites, bilateral Vitamin D  insufficiency Borderline personality disorder (CMS/HHS-HCC) Gastroesophageal reflux disease without esophagitis Dyslipidemia   Surgical Procedure: Laparoscopic Roux-en-Y gastric bypass, Laparoscopic primary umbilical hernia repair, upper endoscopy  Discharge Condition: Good Disposition: Home  Diet recommendation: Postoperative gastric bypass diet  Filed Weights   08/26/24 0951  Weight: 125.2 kg     Hospital Course:  The patient was admitted for a planned laparoscopic Roux-en-Y gastric bypass. Had to repair an inicidental umbilical hernia. Please see operative note. Preoperatively the patient was given 5000 units of subcutaneous heparin  for DVT prophylaxis. ERAS protocol was used. Postoperative prophylactic heparin  dosing was started on the evening of  postoperative day 0.  The patient was started on ice chips and water on the evening of POD 0 which they tolerated. On postoperative day 1 The patient's diet was advanced to protein shakes which they also tolerated. But patient was having pain control issues mainly related to the umbilical hernia repair.  We had placed 3 stitches in the umbilical fascia to prevent an early bowel obstruction. On POD 2, The patient was ambulating without difficulty. Their vital signs are stable without fever or tachycardia. Their hemoglobin had remained stable. Pain was much better with respect to the umbilical hernia repair.  The patient had received discharge instructions and counseling. They were deemed stable for discharge.  Patient did not meet threshold criteria for extended chemical VTE prophylaxis after discharge  BP 121/78 (BP Location: Right Arm)   Pulse 67   Temp (!) 97.4 F (36.3 C) (Oral)   Resp 18   Ht 5' 4 (1.626 m)   Wt 125.2 kg   SpO2 100%   BMI 47.38 kg/m   Gen: alert, NAD, non-toxic appearing Pupils: equal, no scleral icterus Pulm: Lungs clear to auscultation, symmetric chest rise CV: regular rate and rhythm Abd: soft, min tender, nondistended. No cellulitis. No incisional hernia Ext: no edema, no calf tenderness Skin: no rash, no jaundice  Discharge Instructions  Discharge Instructions     Ambulate hourly while awake   Complete by: As directed    Call MD for:  difficulty breathing, headache or visual disturbances   Complete by: As directed    Call MD for:  persistant dizziness or light-headedness   Complete by: As directed    Call MD for:  persistant nausea and vomiting   Complete by: As directed    Call MD for:  redness, tenderness, or  signs of infection (pain, swelling, redness, odor or green/yellow discharge around incision site)   Complete by: As directed    Call MD for:  severe uncontrolled pain   Complete by: As directed    Call MD for:  temperature >101 F   Complete by:  As directed    Diet bariatric full liquid   Complete by: As directed    Discharge instructions   Complete by: As directed    See bariatric discharge instructions   Incentive spirometry   Complete by: As directed    Perform hourly while awake      Allergies as of 08/28/2024       Reactions   Strawberry Extract Itching, Rash        Medication List     STOP taking these medications    ibuprofen  200 MG tablet Commonly known as: ADVIL    traMADol  50 MG tablet Commonly known as: ULTRAM        TAKE these medications    acetaminophen  500 MG tablet Commonly known as: TYLENOL  Take 2 tablets (1,000 mg total) by mouth every 8 (eight) hours for 5 days.   amitriptyline  100 MG tablet Commonly known as: ELAVIL  Take 100 mg by mouth at bedtime.   ARIPiprazole  20 MG tablet Commonly known as: ABILIFY  Take 20 mg by mouth at bedtime.   clonazePAM 0.5 MG tablet Commonly known as: KLONOPIN Take 0.5 mg by mouth daily as needed for anxiety.   fluticasone  50 MCG/ACT nasal spray Commonly known as: FLONASE  Place 2 sprays into both nostrils daily. What changed:  when to take this reasons to take this   gabapentin  100 MG capsule Commonly known as: NEURONTIN  Take 1 capsule (100 mg total) by mouth every 12 (twelve) hours.   levothyroxine  50 MCG tablet Commonly known as: SYNTHROID  Take 50 mcg by mouth daily before breakfast.   methocarbamol  750 MG tablet Commonly known as: ROBAXIN  Take 1 tablet (750 mg total) by mouth every 8 (eight) hours as needed for up to 7 days (use for muscle cramps/pain).   mirtazapine  30 MG tablet Commonly known as: REMERON  Take 30 mg by mouth at bedtime.   omeprazole  40 MG capsule Commonly known as: PRILOSEC TAKE 1 CAPSULE(40 MG) BY MOUTH TWICE DAILY BEFORE A MEAL   ondansetron  4 MG tablet Commonly known as: ZOFRAN  Take 1 tablet (4 mg total) by mouth every 8 (eight) hours as needed for nausea or vomiting.   oxyCODONE  5 MG immediate release  tablet Commonly known as: Oxy IR/ROXICODONE  Take 1 tablet (5 mg total) by mouth every 6 (six) hours as needed for breakthrough pain or severe pain (pain score 7-10).   rosuvastatin  10 MG tablet Commonly known as: Crestor  Take 1 tablet (10 mg total) by mouth daily.   VITAMIN B-1 PO Take 1 tablet by mouth in the morning.        Follow-up Information     Tanda Locus, MD Follow up on 09/17/2024.   Specialty: General Surgery Why: Please arrive 15 minutes prior to your appointment at [facility] information: [address] [place] [address] [phone]         Tari Tonja Barban, [place] Follow up on 10/16/2024.   Specialty: General Surgery Why: Please arrive 15 minutes prior to your appointment at 9am with MYRTIS Barban on behalf of Dr. Tanda Pass information: [address] [facility] [place] [facility] [place] [address] [phone]  The results of significant diagnostics from this hospitalization (including imaging, microbiology, ancillary and laboratory) are listed below for reference.    Significant Diagnostic Studies: No results found.  Labs: Basic Metabolic Panel: Recent Labs  Lab 08/21/24 1320 08/27/24 0514  NA 138 139  K 4.0 4.7  CL 103 105  CO2 24 25  GLUCOSE 95 144*  BUN 11 7  CREATININE 0.92 0.98  CALCIUM  10.2 9.4   Liver Function Tests: Recent Labs  Lab 08/21/24 1320 08/27/24 0514  AST 49* 55*  ALT 69* 78*  ALKPHOS 91 84  BILITOT 0.6 0.5  PROT 8.4* 8.0  ALBUMIN 4.4 4.4    CBC: Recent Labs  Lab 08/21/24 1320 08/27/24 0514 08/28/24 0448  WBC 9.4 15.6* 10.7*  NEUTROABS 6.3 13.5* 6.5  HGB 14.6 13.4 11.9*  HCT 45.3 41.1 37.0  MCV 89.5 88.0 90.5  PLT 306 307 251    CBG: No results for input(s): GLUCAP in the last 168 hours.  Principal Problem:   S/P gastric bypass   Time coordinating discharge: 20 min  Signed:  Camellia CHRISTELLA Blush, MD [facility]  [facility] [phone] 08/28/2024, 11:45 AM

## 2024-08-28 NOTE — Progress Notes (Signed)
 Discharge medications delivered to patient at the bedside in a secure bag.

## 2024-08-28 NOTE — Care Management Important Message (Signed)
 Important Message  Patient Details IM Letter given. Name: [name] MRN: [id number] Date of Birth: 05-25-1991   Important Message Given:  Yes - Medicare IM     [name] 08/28/2024, 10:36 AM

## 2024-08-28 NOTE — Progress Notes (Signed)
 Discharge instructions discussed with patient, verbalized agreement and understanding

## 2024-09-01 ENCOUNTER — Telehealth (HOSPITAL_COMMUNITY): Payer: Self-pay | Admitting: *Deleted

## 2024-09-01 NOTE — Telephone Encounter (Signed)
 1. Tell me about your pain and pain management?     Pt feels much better and states just some minor discomfort around the umbilical hernia repair area.   2. Let's talk about fluid intake. How much total fluid are you taking in?      Pt states that they are working to meet goal of 64 oz of fluid today and has been doing very well meeting the goal. Pt encouraged to continue to work towards meeting goal. Pt instructed to assess status and suggestions daily utilizing Hydration Action Plan on discharge folder and to call [facility] if in the red zone.      3. How much protein have you taken in the last day?     Pt states that he is working to meet the goal of 60- 80g of protein today. Pt plans to drink the reminder of goal throughout the day to meet criteria.      4. Have you had nausea? Tell me about when you have experienced nausea and what you did to help?   Pt denies nausea.   5. Has the frequency or color changed with your urine?   Pt did not report any changes in frequency or urgency.   6. Tell me what your incisions look like?   Incisions look fine. Pt denies a fever, chills. Pt states incisions are not swollen, open, or draining. Pt encouraged to call [facility] if incisions change.     7. Have you been passing gas? BM?   Pt states that they are having BMs.  Pt states that they have had a BM. Pt instructed to take either Miralax or MoM as instructed per Gastric Bypass/Sleeve Discharge Home Care Instructions. Pt to call surgeon's office if not able to have BM with medication.   8. If a problem or question were to arise who would you call? Do you know contact numbers for [facility], [facility], and [facility]?   Pt knows to call [facility] for surgical, [facility] for nutrition, and [facility] for non-urgent questions or concerns. Pt denies dehydration symptoms. Pt can describe s/sx of dehydration.   9. How has the walking going?   Pt states s/he is walking around and able to be active without difficulty.   10. How  are your vitamins and calcium  going? How are you taking them?     Pt states that s/he is taking his/her supplements and vitamins without difficulty.

## 2024-09-09 ENCOUNTER — Encounter

## 2024-09-17 ENCOUNTER — Ambulatory Visit: Admitting: Student

## 2024-11-24 ENCOUNTER — Encounter: Admitting: Dietician
